# Patient Record
Sex: Female | Born: 1960
Health system: Southern US, Community
[De-identification: ages and names within clinical notes are randomized; demographics above are authoritative.]

## PROBLEM LIST (undated history)

## (undated) DIAGNOSIS — Z9889 Other specified postprocedural states: Secondary | ICD-10-CM

## (undated) DIAGNOSIS — E079 Disorder of thyroid, unspecified: Secondary | ICD-10-CM

## (undated) DIAGNOSIS — L509 Urticaria, unspecified: Secondary | ICD-10-CM

## (undated) DIAGNOSIS — M797 Fibromyalgia: Secondary | ICD-10-CM

## (undated) DIAGNOSIS — G510 Bell's palsy: Secondary | ICD-10-CM

## (undated) DIAGNOSIS — I10 Essential (primary) hypertension: Secondary | ICD-10-CM

## (undated) DIAGNOSIS — E063 Autoimmune thyroiditis: Secondary | ICD-10-CM

## (undated) DIAGNOSIS — I499 Cardiac arrhythmia, unspecified: Secondary | ICD-10-CM

## (undated) DIAGNOSIS — M1990 Unspecified osteoarthritis, unspecified site: Secondary | ICD-10-CM

## (undated) DIAGNOSIS — I509 Heart failure, unspecified: Secondary | ICD-10-CM

## (undated) DIAGNOSIS — G4730 Sleep apnea, unspecified: Secondary | ICD-10-CM

## (undated) DIAGNOSIS — D8940 Mast cell activation, unspecified: Secondary | ICD-10-CM

## (undated) DIAGNOSIS — Z9289 Personal history of other medical treatment: Secondary | ICD-10-CM

## (undated) DIAGNOSIS — R079 Chest pain, unspecified: Secondary | ICD-10-CM

## (undated) HISTORY — DX: Urticaria, unspecified: L50.9

## (undated) HISTORY — DX: Autoimmune thyroiditis: E06.3

## (undated) HISTORY — PX: TONSILLECTOMY: SUR1361

---

## 2002-03-02 ENCOUNTER — Encounter: Payer: Self-pay | Admitting: Family Medicine

## 2002-03-02 ENCOUNTER — Ambulatory Visit (HOSPITAL_COMMUNITY): Admission: RE | Admit: 2002-03-02 | Discharge: 2002-03-02 | Payer: Self-pay | Admitting: Family Medicine

## 2004-02-09 ENCOUNTER — Emergency Department (HOSPITAL_COMMUNITY): Admission: EM | Admit: 2004-02-09 | Discharge: 2004-02-10 | Payer: Self-pay | Admitting: *Deleted

## 2006-06-06 ENCOUNTER — Emergency Department (HOSPITAL_COMMUNITY): Admission: EM | Admit: 2006-06-06 | Discharge: 2006-06-06 | Payer: Self-pay | Admitting: Emergency Medicine

## 2008-12-16 ENCOUNTER — Ambulatory Visit (HOSPITAL_COMMUNITY): Admission: RE | Admit: 2008-12-16 | Discharge: 2008-12-16 | Payer: Self-pay | Admitting: Family Medicine

## 2012-04-03 ENCOUNTER — Emergency Department (HOSPITAL_COMMUNITY)
Admission: EM | Admit: 2012-04-03 | Discharge: 2012-04-03 | Disposition: A | Discharge status: Home / Self Care | Payer: Self-pay | Attending: Emergency Medicine | Admitting: Emergency Medicine

## 2012-04-03 ENCOUNTER — Emergency Department (HOSPITAL_COMMUNITY): Payer: Self-pay

## 2012-04-03 ENCOUNTER — Encounter (HOSPITAL_COMMUNITY): Payer: Self-pay | Admitting: Emergency Medicine

## 2012-04-03 DIAGNOSIS — R42 Dizziness and giddiness: Secondary | ICD-10-CM | POA: Insufficient documentation

## 2012-04-03 DIAGNOSIS — R079 Chest pain, unspecified: Secondary | ICD-10-CM | POA: Insufficient documentation

## 2012-04-03 DIAGNOSIS — R279 Unspecified lack of coordination: Secondary | ICD-10-CM | POA: Insufficient documentation

## 2012-04-03 HISTORY — DX: Essential (primary) hypertension: I10

## 2012-04-03 HISTORY — DX: Unspecified osteoarthritis, unspecified site: M19.90

## 2012-04-03 HISTORY — DX: Cardiac arrhythmia, unspecified: I49.9

## 2012-04-03 HISTORY — DX: Fibromyalgia: M79.7

## 2012-04-03 LAB — DIFFERENTIAL
Basophils Relative: 1 % (ref 0–1)
Lymphocytes Relative: 47 % — ABNORMAL HIGH (ref 12–46)
Lymphs Abs: 3.9 10*3/uL (ref 0.7–4.0)
Monocytes Absolute: 0.7 10*3/uL (ref 0.1–1.0)
Monocytes Relative: 8 % (ref 3–12)
Neutro Abs: 3.4 10*3/uL (ref 1.7–7.7)
Neutrophils Relative %: 41 % — ABNORMAL LOW (ref 43–77)

## 2012-04-03 LAB — BASIC METABOLIC PANEL
BUN: 17 mg/dL (ref 6–23)
CO2: 24 mEq/L (ref 19–32)
Chloride: 104 mEq/L (ref 96–112)
GFR calc Af Amer: >90 mL/min (ref >90)
Potassium: 3.4 mEq/L — ABNORMAL LOW (ref 3.5–5.1)

## 2012-04-03 LAB — CBC
HCT: 39.2 % (ref 36.0–46.0)
Hemoglobin: 12.9 g/dL (ref 12.0–15.0)
MCHC: 32.9 g/dL (ref 30.0–36.0)
RBC: 4.79 MIL/uL (ref 3.87–5.11)
WBC: 8.4 10*3/uL (ref 4.0–10.5)

## 2012-04-03 LAB — CARDIAC PANEL(CRET KIN+CKTOT+MB+TROPI)
Relative Index: INVALID (ref 0.0–2.5)
Total CK: 84 U/L (ref 7–177)
Troponin I: <0.3 ng/mL (ref <0.30)

## 2012-04-03 MED ORDER — MECLIZINE HCL 12.5 MG PO TABS
25.0000 mg | ORAL_TABLET | Freq: Once | ORAL | Status: AC
  Administered 2012-04-03: 25 mg via ORAL
  Filled 2012-04-03: qty 2

## 2012-04-03 MED ORDER — LORAZEPAM 1 MG PO TABS
1.0000 mg | ORAL_TABLET | Freq: Once | ORAL | Status: DC
  Filled 2012-04-03: qty 1

## 2012-04-03 MED ORDER — LORAZEPAM 1 MG PO TABS: 1.0000 mg | ORAL_TABLET | Freq: Three times a day (TID) | ORAL | Status: AC | PRN

## 2012-04-03 MED ORDER — MECLIZINE HCL 25 MG PO TABS: 25.0000 mg | ORAL_TABLET | Freq: Four times a day (QID) | ORAL | Status: AC | PRN

## 2012-04-03 NOTE — Discharge Instructions (Signed)
Benign Positional Vertigo Vertigo means you feel like you or your surroundings are moving when they are not. Benign positional vertigo is the most common form of vertigo. Benign means that the cause of your condition is not serious. Benign positional vertigo is more common in older adults. CAUSES  Benign positional vertigo is the result of an upset in the labyrinth system. This is an area in the middle ear that helps control your balance. This may be caused by a viral infection, head injury, or repetitive motion. However, often no specific cause is found. SYMPTOMS  Symptoms of benign positional vertigo occur when you move your head or eyes in different directions. Some of the symptoms may include:  Loss of balance and falls.   Vomiting.   Blurred vision.   Dizziness.   Nausea.   Involuntary eye movements (nystagmus).  DIAGNOSIS  Benign positional vertigo is usually diagnosed by physical exam. If the specific cause of your benign positional vertigo is unknown, your caregiver may perform imaging tests, such as magnetic resonance imaging (MRI) or computed tomography (CT). TREATMENT  Your caregiver may recommend movements or procedures to correct the benign positional vertigo. Medicines such as meclizine, benzodiazepines, and medicines for nausea may be used to treat your symptoms. In rare cases, if your symptoms are caused by certain conditions that affect the inner ear, you may need surgery. HOME CARE INSTRUCTIONS   Follow your caregiver's instructions.   Move slowly. Do not make sudden body or head movements.   Avoid driving.   Avoid operating heavy machinery.   Avoid performing any tasks that would be dangerous to you or others during a vertigo episode.   Drink enough fluids to keep your urine clear or pale yellow.  SEEK IMMEDIATE MEDICAL CARE IF:   You develop problems with walking, weakness, numbness, or using your arms, hands, or legs.   You have difficulty speaking.   You  develop severe headaches.   Your nausea or vomiting continues or gets worse.   You develop visual changes.   Your family or friends notice any behavioral changes.   Your condition gets worse.   You have a fever.   You develop a stiff neck or sensitivity to light.  MAKE SURE YOU:   Understand these instructions.   Will watch your condition.   Will get help right away if you are not doing well or get worse.  Document Released: 07/26/2006 Document Revised: 10/07/2011 Document Reviewed: 07/08/2011 [facility] Patient Information 2012 [place], [place].  CT scan of head was normal. Medication for dizziness and relaxation. Followup your primary care Dr.

## 2012-04-03 NOTE — ED Notes (Signed)
Pt states she took 2 81mg  aspirin prior to arrival.

## 2012-04-03 NOTE — ED Notes (Signed)
Pt refused her ativan. Stated that she has xanax at home and she does not like the way it makes her feel, states it makes her feel "loopy"

## 2012-04-03 NOTE — ED Notes (Signed)
Pt presents with dizziness and chest pain. States she has been dizzy on and off for the last few days. States that started this evening she had chest pain that she describes as a pressure. States that she also has panic attacks and that these symtoms feel similar to the panic attacks she has had in the past. Patient is non diaphoretic at this time. AAx4 and appears neurvous and anxious.

## 2012-04-03 NOTE — ED Provider Notes (Addendum)
History     CSN: [id number]  Arrival date & time 04/03/12  0418   First MD Initiated Contact with Patient 04/03/12 0435      Chief Complaint  Patient presents with  . Chest Pain  . Dizziness    (Consider location/radiation/quality/duration/timing/severity/associated sxs/prior treatment) HPI....dizziness for 2 days. Patient is slightly ataxic with ambulation. Complains of intermittent chest discomfort. Has history of panic attacks. No dyspnea, diaphoresis, nausea. No radiation of chest pain. Nothing makes any of her symptoms better or worse. Severity is mild.  Past Medical History  Diagnosis Date  . Fibromyalgia   . Arthritis   . Dysrhythmia, cardiac   . Hypertension     History reviewed. No pertinent past surgical history.  History reviewed. No pertinent family history.  History  Substance Use Topics  . Smoking status: Former Games developer  . Smokeless tobacco: Not on file  . Alcohol Use: No    OB History    Grav Para Term Preterm Abortions TAB SAB Ect Mult Living                  Review of Systems  All other systems reviewed and are negative.    Allergies  Review of patient's allergies indicates no known allergies.  Home Medications  No current outpatient prescriptions on file.  LMP 01/02/2012  Physical Exam  Nursing note and vitals reviewed. Constitutional: She is oriented to person, place, and time. She appears well-developed and well-nourished.       obese  HENT:  Head: Normocephalic and atraumatic.  Eyes: Conjunctivae and EOM are normal. Pupils are equal, round, and reactive to light.  Neck: Normal range of motion. Neck supple.  Cardiovascular: Normal rate and regular rhythm.   Pulmonary/Chest: Effort normal and breath sounds normal.  Abdominal: Soft. Bowel sounds are normal.  Musculoskeletal: Normal range of motion.  Neurological: She is alert and oriented to person, place, and time.       Slight gait ataxia  Skin: Skin is warm and dry.    Psychiatric: She has a normal mood and affect.    ED Course  Procedures (including critical care time)  Labs Reviewed - No data to display No results found. Results for orders placed during the hospital encounter of 04/03/12  CBC      Component Value Range   WBC 8.4  4.0 - 10.5 (K/uL)   RBC 4.79  3.87 - 5.11 (MIL/uL)   Hemoglobin 12.9  12.0 - 15.0 (g/dL)   HCT 11.9  14.7 - 82.9 (%)   MCV 81.8  78.0 - 100.0 (fL)   MCH 26.9  26.0 - 34.0 (pg)   MCHC 32.9  30.0 - 36.0 (g/dL)   RDW 56.2 (*) 13.0 - 15.5 (%)   Platelets 291  150 - 400 (K/uL)  DIFFERENTIAL      Component Value Range   Neutrophils Relative 41 (*) 43 - 77 (%)   Neutro Abs 3.4  1.7 - 7.7 (K/uL)   Lymphocytes Relative 47 (*) 12 - 46 (%)   Lymphs Abs 3.9  0.7 - 4.0 (K/uL)   Monocytes Relative 8  3 - 12 (%)   Monocytes Absolute 0.7  0.1 - 1.0 (K/uL)   Eosinophils Relative 3  0 - 5 (%)   Eosinophils Absolute 0.3  0.0 - 0.7 (K/uL)   Basophils Relative 1  0 - 1 (%)   Basophils Absolute 0.1  0.0 - 0.1 (K/uL)  BASIC METABOLIC PANEL      Component Value  Range   Sodium 139  135 - 145 (mEq/L)   Potassium 3.4 (*) 3.5 - 5.1 (mEq/L)   Chloride 104  96 - 112 (mEq/L)   CO2 24  19 - 32 (mEq/L)   Glucose, Bld 130 (*) 70 - 99 (mg/dL)   BUN 17  6 - 23 (mg/dL)   Creatinine, Ser 1.61  0.50 - 1.10 (mg/dL)   Calcium 9.3  8.4 - 09.6 (mg/dL)   GFR calc non Af Amer >90  >90 (mL/min)   GFR calc Af Amer >90  >90 (mL/min)  CARDIAC PANEL(CRET KIN+CKTOT+MB+TROPI)      Component Value Range   Total CK 84  7 - 177 (U/L)   CK, MB 1.9  0.3 - 4.0 (ng/mL)   Troponin I <0.30  <0.30 (ng/mL)   Relative Index RELATIVE INDEX IS INVALID  0.0 - 2.5    Ct Head Wo Contrast  04/03/2012  *RADIOLOGY REPORT*  Clinical Data: Dizziness.  CT HEAD WITHOUT CONTRAST  Technique:  Contiguous axial images were obtained from the base of the skull through the vertex without contrast.  Comparison: None  Findings: The ventricles are normal.  No extra-axial fluid  collections are seen.  The brainstem and cerebellum are unremarkable.  No acute intracranial findings such as infarction or hemorrhage.  No mass lesions.  The bony calvarium is intact.  The visualized paranasal sinuses and mastoid air cells are clear.  IMPRESSION: No acute intracranial findings or mass lesion.  Original Report Authenticated By: P. Loralie Champagne, M.D.   No diagnosis found.   Date: 04/03/2012  Rate: 87  Rhythm: normal sinus rhythm  QRS Axis: normal  Intervals: normal  ST/T Wave abnormalities: normal  Conduction Disutrbances: none  Narrative Interpretation: unremarkable     MDM  History and physical most consistent with vertigo. No evidence of stroke. Was discharged with Antivert and Ativan.  She will followup with her primary care Dr. Discussed with patient and husband.        Donnetta Hutching, MD 04/03/12 0454  Donnetta Hutching, MD 04/03/12 [phone]

## 2012-04-03 NOTE — ED Notes (Signed)
Pt returned from xray

## 2012-09-06 ENCOUNTER — Encounter (HOSPITAL_COMMUNITY): Payer: Self-pay | Admitting: *Deleted

## 2012-09-06 ENCOUNTER — Emergency Department (HOSPITAL_COMMUNITY): Payer: Self-pay

## 2012-09-06 ENCOUNTER — Emergency Department (HOSPITAL_COMMUNITY)
Admission: EM | Admit: 2012-09-06 | Discharge: 2012-09-06 | Disposition: A | Discharge status: Home / Self Care | Payer: Self-pay | Attending: Emergency Medicine | Admitting: Emergency Medicine

## 2012-09-06 DIAGNOSIS — Z8679 Personal history of other diseases of the circulatory system: Secondary | ICD-10-CM | POA: Insufficient documentation

## 2012-09-06 DIAGNOSIS — M549 Dorsalgia, unspecified: Secondary | ICD-10-CM

## 2012-09-06 DIAGNOSIS — M545 Low back pain, unspecified: Secondary | ICD-10-CM | POA: Insufficient documentation

## 2012-09-06 DIAGNOSIS — Z8739 Personal history of other diseases of the musculoskeletal system and connective tissue: Secondary | ICD-10-CM | POA: Insufficient documentation

## 2012-09-06 DIAGNOSIS — R319 Hematuria, unspecified: Secondary | ICD-10-CM | POA: Insufficient documentation

## 2012-09-06 DIAGNOSIS — Z79899 Other long term (current) drug therapy: Secondary | ICD-10-CM | POA: Insufficient documentation

## 2012-09-06 DIAGNOSIS — Z87891 Personal history of nicotine dependence: Secondary | ICD-10-CM | POA: Insufficient documentation

## 2012-09-06 DIAGNOSIS — I10 Essential (primary) hypertension: Secondary | ICD-10-CM | POA: Insufficient documentation

## 2012-09-06 LAB — URINE MICROSCOPIC-ADD ON

## 2012-09-06 LAB — URINALYSIS, ROUTINE W REFLEX MICROSCOPIC
Bilirubin Urine: NEGATIVE
Leukocytes, UA: NEGATIVE
Nitrite: NEGATIVE
Specific Gravity, Urine: <1.005 — ABNORMAL LOW (ref 1.005–1.030)
Urobilinogen, UA: 0.2 mg/dL (ref 0.0–1.0)

## 2012-09-06 LAB — COMPREHENSIVE METABOLIC PANEL
Albumin: 3.9 g/dL (ref 3.5–5.2)
BUN: 14 mg/dL (ref 6–23)
Creatinine, Ser: 0.67 mg/dL (ref 0.50–1.10)
Total Protein: 7.2 g/dL (ref 6.0–8.3)

## 2012-09-06 LAB — CBC WITH DIFFERENTIAL/PLATELET
Eosinophils Relative: 2 % (ref 0–5)
HCT: 41.6 % (ref 36.0–46.0)
Hemoglobin: 13.9 g/dL (ref 12.0–15.0)
Lymphocytes Relative: 45 % (ref 12–46)
Lymphs Abs: 3.1 10*3/uL (ref 0.7–4.0)
MCV: 83.4 fL (ref 78.0–100.0)
Monocytes Absolute: 0.6 10*3/uL (ref 0.1–1.0)
Monocytes Relative: 8 % (ref 3–12)
RBC: 4.99 MIL/uL (ref 3.87–5.11)
WBC: 6.9 10*3/uL (ref 4.0–10.5)

## 2012-09-06 MED ORDER — TRAMADOL HCL 50 MG PO TABS: 50.0000 mg | ORAL_TABLET | Freq: Four times a day (QID) | ORAL | Status: DC | PRN

## 2012-09-06 NOTE — ED Provider Notes (Signed)
History   This chart was scribed for Summer Munch, MD by Sofie Rower. The patient was seen in room APA03/APA03 and the patient's care was started at 7:32PM.     CSN: [id number]  Arrival date & time 09/06/12  1909   First MD Initiated Contact with Patient 09/06/12 1932      Chief Complaint  Patient presents with  . Back Pain    (Consider location/radiation/quality/duration/timing/severity/associated sxs/prior treatment) Patient is a 51 y.o. female presenting with back pain. The history is provided by the patient. No language interpreter was used.  Back Pain     Summer [name] is a 51 y.o. female , with a hx of fibromyalgia, arthritis, and UTI, who presents to the Emergency Department complaining of gradual, progressively worsening, back pain located bilaterally at the lower lumbar region, onset three weeks ago.  Associated symptoms include bilateral hip pain (onset two days ago, 09/04/12) and nausea. The pt reports she is experiencing a lower back pain, however it is more severe on the right side of the lower back, in comparison towards that of the left side. In addition, the pt informs she was recently evaluated and treated for a UTI by her PCP one week ago. The pt has taken antibiotics, Macrobid, and Cipro which provide moderate relief of her UTI symptoms.  The pt denies any fall or trauma episode which could have prompted the back pain, diarrhea, chills, difficulty breathing, vaginal bleeding, and vaginal discharge.   The pt does not smoke or drink alcohol.   PCP is Dr. Gerda Diss.    Past Medical History  Diagnosis Date  . Fibromyalgia   . Arthritis   . Dysrhythmia, cardiac   . Hypertension     History reviewed. No pertinent past surgical history.  History reviewed. No pertinent family history.  History  Substance Use Topics  . Smoking status: Former Games developer  . Smokeless tobacco: Not on file  . Alcohol Use: No    OB History    Grav Para Term Preterm Abortions TAB SAB  Ect Mult Living                  Review of Systems  Constitutional:       Per HPI, otherwise negative  HENT:       Per HPI, otherwise negative  Eyes: Negative.   Respiratory:       Per HPI, otherwise negative  Cardiovascular:       Per HPI, otherwise negative  Gastrointestinal: Negative for vomiting.  Genitourinary: Negative.   Musculoskeletal: Positive for back pain.       Per HPI, otherwise negative  Skin: Negative.   Neurological: Negative for syncope.    Allergies  Bee venom and Shellfish allergy  Home Medications   Current Outpatient Rx  Name  Route  Sig  Dispense  Refill  . ACIDOPHILUS PO TABS   Oral   Take 1 tablet by mouth daily.         Marland Kitchen CIPROFLOXACIN HCL 500 MG PO TABS   Oral   Take 500 mg by mouth 2 (two) times daily.         Marland Kitchen NITROFURANTOIN MONOHYD MACRO 100 MG PO CAPS   Oral   Take 100 mg by mouth 2 (two) times daily.           BP 121/62  Pulse 79  Temp 98.2 F (36.8 C) (Oral)  Resp 20  Ht 5\' 5"  (1.651 m)  Wt 250 lb (113.399 kg)  BMI 41.60 kg/m2  SpO2 100%  Physical Exam  Nursing note and vitals reviewed. Constitutional: She is oriented to person, place, and time. She appears well-developed and well-nourished. No distress.  HENT:  Head: Normocephalic and atraumatic.  Eyes: Conjunctivae normal and EOM are normal.  Cardiovascular: Normal rate and regular rhythm.   Pulmonary/Chest: Effort normal and breath sounds normal. No stridor. No respiratory distress.  Abdominal: Soft. She exhibits no distension. There is no tenderness. There is no rebound and no guarding.  Musculoskeletal: She exhibits no edema.       No referred pain detected. Good strength at the bilateral lower extremities.   Neurological: She is alert and oriented to person, place, and time. No cranial nerve deficit.  Skin: Skin is warm and dry.  Psychiatric: She has a normal mood and affect.    ED Course  Procedures (including critical care time)  DIAGNOSTIC  STUDIES: Oxygen Saturation is 100% on room air, normal by my interpretation.    COORDINATION OF CARE:   7:42 PM- Treatment plan concerning blood work, UA, and possible muscle strain discussed with patient. Pt agrees with treatment.   9:23 PM- Recheck. Treatment plan concerning laboratory results and CT scan of abdomen discussed with patient. Pt agrees with treatment.     Results for orders placed during the hospital encounter of 09/06/12  URINALYSIS, ROUTINE W REFLEX MICROSCOPIC      Component Value Range   Color, Urine YELLOW  YELLOW   APPearance CLEAR  CLEAR   Specific Gravity, Urine <1.005 (*) 1.005 - 1.030   pH 5.5  5.0 - 8.0   Glucose, UA NEGATIVE  NEGATIVE mg/dL   Hgb urine dipstick TRACE (*) NEGATIVE   Bilirubin Urine NEGATIVE  NEGATIVE   Ketones, ur TRACE (*) NEGATIVE mg/dL   Protein, ur NEGATIVE  NEGATIVE mg/dL   Urobilinogen, UA 0.2  0.0 - 1.0 mg/dL   Nitrite NEGATIVE  NEGATIVE   Leukocytes, UA NEGATIVE  NEGATIVE  COMPREHENSIVE METABOLIC PANEL      Component Value Range   Sodium 137  135 - 145 mEq/L   Potassium 3.3 (*) 3.5 - 5.1 mEq/L   Chloride 101  96 - 112 mEq/L   CO2 25  19 - 32 mEq/L   Glucose, Bld 88  70 - 99 mg/dL   BUN 14  6 - 23 mg/dL   Creatinine, Ser 1.47  0.50 - 1.10 mg/dL   Calcium 9.7  8.4 - 82.9 mg/dL   Total Protein 7.2  6.0 - 8.3 g/dL   Albumin 3.9  3.5 - 5.2 g/dL   AST 21  0 - 37 U/L   ALT 16  0 - 35 U/L   Alkaline Phosphatase 87  39 - 117 U/L   Total Bilirubin 0.5  0.3 - 1.2 mg/dL   GFR calc non Af Amer >90  >90 mL/min   GFR calc Af Amer >90  >90 mL/min  CBC WITH DIFFERENTIAL      Component Value Range   WBC 6.9  4.0 - 10.5 K/uL   RBC 4.99  3.87 - 5.11 MIL/uL   Hemoglobin 13.9  12.0 - 15.0 g/dL   HCT 56.2  13.0 - 86.5 %   MCV 83.4  78.0 - 100.0 fL   MCH 27.9  26.0 - 34.0 pg   MCHC 33.4  30.0 - 36.0 g/dL   RDW 78.4 (*) 69.6 - 29.5 %   Platelets 297  150 - 400 K/uL   Neutrophils Relative 45  43 - 77 %   Neutro Abs 3.0  1.7 - 7.7 K/uL    Lymphocytes Relative 45  12 - 46 %   Lymphs Abs 3.1  0.7 - 4.0 K/uL   Monocytes Relative 8  3 - 12 %   Monocytes Absolute 0.6  0.1 - 1.0 K/uL   Eosinophils Relative 2  0 - 5 %   Eosinophils Absolute 0.2  0.0 - 0.7 K/uL   Basophils Relative 0  0 - 1 %   Basophils Absolute 0.0  0.0 - 0.1 K/uL  URINE MICROSCOPIC-ADD ON      Component Value Range   Squamous Epithelial / LPF RARE  RARE   WBC, UA 0-2  <3 WBC/hpf   RBC / HPF 0-2  <3 RBC/hpf   Bacteria, UA RARE  RARE   Ct Abdomen Pelvis Wo Contrast  09/06/2012  *RADIOLOGY REPORT*  Clinical Data: Bilateral flank pain, right greater than left.  CT ABDOMEN AND PELVIS WITHOUT CONTRAST  Technique:  Multidetector CT imaging of the abdomen and pelvis was performed following the standard protocol without intravenous contrast.  Comparison: None.  Findings: No evidence of urinary tract calculi or obstruction on either side.  Approximate 2.5 cm simple cyst arising from the lower pole of the right kidney.  Within the limits of the unenhanced technique, no significant focal involving either kidney.  Normal unenhanced appearance of the liver, spleen and gallbladder. No biliary ductal dilation.  No visible aorto-iliofemoral atherosclerosis.  No significant lymphadenopathy.  Stomach decompressed and unremarkable by CT.  Normal-appearing small bowel and colon.  Normal appendix in the right upper pelvis. No ascites.  Small periumbilical hernia containing fat.  Uterus and ovaries unremarkable by CT.  No free pelvic fluid. Urinary bladder unremarkable.  Phleboliths low in both sides of the pelvis.  Bone window images demonstrate mild lower thoracic spondylosis and facet degenerative changes involving the lower lumbar spine. Visualized lung bases clear.  IMPRESSION:  1.  No evidence of urinary tract calculi or obstruction on either side. 2.  No acute or significant abnormalities involving the abdomen or pelvis.   Original Report Authenticated By: Hulan Saas, M.D.        No diagnosis found.    MDM  I personally performed the services described in this documentation, which was scribed in my presence. The recorded information has been reviewed and considered.  The patient presents with ongoing back pain, recent diagnosis of UTI.  On exam she is in no distress.  The patient's vital signs are unremarkable.  The urinalysis does not demonstrate evidence of ongoing infection, but there is trace blood, suggestive of kidney stones given her history of back pain.  A followup CAT scan was unremarkable.  Throughout this evaluation the patient deferred analgesics.  She was discharged in stable condition with PMD followup.  Summer Munch, MD 09/06/12 2206

## 2012-09-06 NOTE — ED Notes (Signed)
Pt notes lower back pain for several weeks. Notes that recently the pain has started to radiate to her sides bilaterally. Notes the right side is more painful however. No other complaints at this time. Denies urinary symptoms at this time but states several weeks ago she did has some pain with urination.

## 2012-09-06 NOTE — ED Notes (Signed)
Recent treatment for UTI , took antibiotic, macrobid, cipro, without relief of sx.  No fever, Nausea, no vomiting.

## 2013-02-01 ENCOUNTER — Ambulatory Visit (INDEPENDENT_AMBULATORY_CARE_PROVIDER_SITE_OTHER): Payer: Self-pay | Admitting: Family Medicine

## 2013-02-01 ENCOUNTER — Encounter: Payer: Self-pay | Admitting: Family Medicine

## 2013-02-01 VITALS — BP 140/100 | Temp 97.5°F | Wt 273.0 lb

## 2013-02-01 DIAGNOSIS — G510 Bell's palsy: Secondary | ICD-10-CM | POA: Insufficient documentation

## 2013-02-01 MED ORDER — PREDNISONE 20 MG PO TABS: ORAL_TABLET | ORAL | Status: AC

## 2013-02-01 MED ORDER — ACYCLOVIR 400 MG PO TABS: 400.0000 mg | ORAL_TABLET | Freq: Every day | ORAL | Status: AC

## 2013-02-01 MED ORDER — ACYCLOVIR 800 MG PO TABS: ORAL_TABLET | ORAL | Status: DC

## 2013-02-01 NOTE — Patient Instructions (Addendum)
Bell's Palsy       Bell's palsy is a condition in which the muscles on one side of the face cannot move (paralysis). This is because the nerves in the face are paralyzed. It is most often thought to be caused by a virus. The virus causes swelling of the nerve that controls movement on one side of the face. The nerve travels through a tight space surrounded by bone. When the nerve swells, it can be compressed by the bone. This results in damage to the protective covering around the nerve. This damage interferes with how the nerve communicates with the muscles of the face. As a result, it can cause weakness or paralysis of the facial muscles.   Injury (trauma), tumor, and surgery may cause Bell's palsy, but most of the time the cause is unknown. It is a relatively common condition. It starts suddenly (abrupt onset) with the paralysis usually ending within 2 days. Bell's palsy is not dangerous. But because the eye does not close properly, you may need care to keep the eye from getting dry. This can include splinting (to keep the eye shut) or moistening with artificial tears. Bell's palsy very seldom occurs on both sides of the face at the same time.   SYMPTOMS   Eyebrow sagging.   Drooping of the eyelid and corner of the mouth.   Inability to close one eye.   Loss of taste on the front of the tongue.   Sensitivity to loud noises.  TREATMENT   The treatment is usually non-surgical. If the patient is seen within the first 24 to 48 hours, a short course of steroids may be prescribed, in an attempt to shorten the length of the condition. Antiviral medicines may also be used with the steroids, but it is unclear if they are helpful.   You will need to protect your eye, if you cannot close it. The cornea (clear covering over your eye) will become dry and can be damaged. Artificial tears can be used to keep your eye moist. Glasses or an eye patch should be worn to protect your eye.   PROGNOSIS   Recovery is variable, ranging  from days to months. Although the problem usually goes away completely (about 80% of cases resolve), predicting the outcome is impossible. Most people improve within 3 weeks of when the symptoms began. Improvement may continue for 3 to 6 months. A small number of people have moderate to severe weakness that is permanent.   HOME CARE INSTRUCTIONS   If your caregiver prescribed medication to reduce swelling in the nerve, use as directed. Do not stop taking the medication unless directed by your caregiver.   Use moisturizing eye drops as needed to prevent drying of your eye, as directed by your caregiver.   Protect your eye, as directed by your caregiver.   Use facial massage and exercises, as directed by your caregiver.   Perform your normal activities, and get your normal rest.  SEEK IMMEDIATE MEDICAL CARE IF:   There is pain, redness or irritation in the eye.   You or your child has an oral temperature above 102 F (38.9 C), not controlled by medicine.  MAKE SURE YOU:   Understand these instructions.   Will watch your condition.   Will get help right away if you are not doing well or get worse.  Document Released: 10/18/2005 Document Revised: 01/10/2012 Document Reviewed: 10/27/2009   [employer] Patient Information 2013 [employer]

## 2013-02-01 NOTE — Progress Notes (Signed)
  Subjective:    Patient ID: Summer [name], female    DOB: 12-06-1960, 52 y.o.   MRN: [id number]  HPI This patient states yesterday she was yawning she felt a pulling in her left lower jaw region into the neck and then she noticed later that day that she was having some numbness and tingling. She also related that she had weakness on that side of the face and when she swallows things he drools out of her mouth she denies any choking she denies any vomiting. She's never had anything like this before. Past medical history family history social history all reviewed.   Review of Systems     Objective:   Physical Exam On physical exam eardrums look normal throat is normal tongue moves well her lungs are clear hearts regular arms strength is equal bilateral hand strength equal bilateral no numbness in the upper extremities chest or legs. She does have some weakness on the left side of the face she has a slight difficult time closing her left eye but she can close to but not as strong as the right side she also has inability to smile on the left side compared to the right side.       Assessment & Plan:  Bell's palsy-we will go ahead and cover with acyclovir 400 mg 5 times daily for the next 10 days, prednisone 20 mg 3 daily for the next 4 days then 2 a day for 4 days. I recommend that we recheck her again in one week's time. Offered to send her to ear nose throat Dr. She has no insurance and she states she cannot afford that. I did talk to her about using artificial tears and the safe method of taking her high at nighttime for sleep.

## 2013-02-06 ENCOUNTER — Encounter: Payer: Self-pay | Admitting: *Deleted

## 2013-02-09 ENCOUNTER — Ambulatory Visit (INDEPENDENT_AMBULATORY_CARE_PROVIDER_SITE_OTHER): Payer: Self-pay | Admitting: Family Medicine

## 2013-02-09 ENCOUNTER — Encounter: Payer: Self-pay | Admitting: Family Medicine

## 2013-02-09 VITALS — BP 132/90 | Temp 98.0°F | Ht 65.0 in | Wt 272.8 lb

## 2013-02-09 DIAGNOSIS — G510 Bell's palsy: Secondary | ICD-10-CM

## 2013-02-09 NOTE — Progress Notes (Signed)
  Subjective:    Patient ID: Summer [name], female    DOB: Dec 28, 1960, 52 y.o.   MRN: [id number]  HPI Patient still having facial weakness on left side no weakness in the arms or leg no numbness tingling no nausea vomiting or loss of consciousness she did take prednisone as directed acyclovir she took 4 times a day on some days 5 times on others. She has noticed no significant improvement. Please see previous note.   Review of Systems see above. No fevers or chills or sinus pressure pain or headache.      Objective:   Physical Exam  Eardrums normal throat normal neck supple lungs clear heart regular she does have facial weakness on the left side of the face consistent with Bell's palsy      Assessment & Plan:  Bell's palsy-finish medication as directed. I do believe this patient would be best served by going ahead and being seen by a specialist we will set her up with ENT. I did instruct her to use Lacri-Lube at nighttime a rise in artificial tears during the day.

## 2013-03-04 ENCOUNTER — Encounter (HOSPITAL_COMMUNITY): Payer: Self-pay | Admitting: Emergency Medicine

## 2013-03-04 ENCOUNTER — Emergency Department (HOSPITAL_COMMUNITY)
Admission: EM | Admit: 2013-03-04 | Discharge: 2013-03-05 | Disposition: A | Discharge status: Home / Self Care | Payer: Self-pay | Attending: Emergency Medicine | Admitting: Emergency Medicine

## 2013-03-04 DIAGNOSIS — Z87891 Personal history of nicotine dependence: Secondary | ICD-10-CM | POA: Insufficient documentation

## 2013-03-04 DIAGNOSIS — Z8669 Personal history of other diseases of the nervous system and sense organs: Secondary | ICD-10-CM | POA: Insufficient documentation

## 2013-03-04 DIAGNOSIS — B349 Viral infection, unspecified: Secondary | ICD-10-CM

## 2013-03-04 DIAGNOSIS — Z8679 Personal history of other diseases of the circulatory system: Secondary | ICD-10-CM | POA: Insufficient documentation

## 2013-03-04 DIAGNOSIS — B9789 Other viral agents as the cause of diseases classified elsewhere: Secondary | ICD-10-CM | POA: Insufficient documentation

## 2013-03-04 DIAGNOSIS — Z8739 Personal history of other diseases of the musculoskeletal system and connective tissue: Secondary | ICD-10-CM | POA: Insufficient documentation

## 2013-03-04 DIAGNOSIS — I10 Essential (primary) hypertension: Secondary | ICD-10-CM | POA: Insufficient documentation

## 2013-03-04 HISTORY — DX: Bell's palsy: G51.0

## 2013-03-04 NOTE — ED Notes (Signed)
Complaining of pain to lymph nodes, joints, back, left leg, and abdomen that started a couple days ago. Reports recently diagnosed with bell's palsy.

## 2013-03-05 ENCOUNTER — Encounter: Payer: Self-pay | Admitting: Family Medicine

## 2013-03-05 ENCOUNTER — Ambulatory Visit (INDEPENDENT_AMBULATORY_CARE_PROVIDER_SITE_OTHER): Payer: Self-pay | Admitting: Family Medicine

## 2013-03-05 VITALS — BP 132/98 | Temp 98.5°F | Wt 274.2 lb

## 2013-03-05 DIAGNOSIS — W57XXXA Bitten or stung by nonvenomous insect and other nonvenomous arthropods, initial encounter: Secondary | ICD-10-CM

## 2013-03-05 LAB — CBC WITH DIFFERENTIAL/PLATELET
Basophils Absolute: 0 10*3/uL (ref 0.0–0.1)
HCT: 39.6 % (ref 36.0–46.0)
Hemoglobin: 13.6 g/dL (ref 12.0–15.0)
Lymphocytes Relative: 43 % (ref 12–46)
Monocytes Absolute: 0.8 10*3/uL (ref 0.1–1.0)
Neutro Abs: 3.9 10*3/uL (ref 1.7–7.7)
RDW: 16.3 % — ABNORMAL HIGH (ref 11.5–15.5)
WBC: 8.6 10*3/uL (ref 4.0–10.5)

## 2013-03-05 MED ORDER — DOXYCYCLINE HYCLATE 100 MG PO CAPS: 100.0000 mg | ORAL_CAPSULE | Freq: Two times a day (BID) | ORAL | Status: DC

## 2013-03-05 MED ORDER — KETOROLAC TROMETHAMINE 30 MG/ML IJ SOLN: 30.0000 mg | Freq: Once | INTRAMUSCULAR | Status: DC

## 2013-03-05 MED ORDER — SODIUM CHLORIDE 0.9 % IV BOLUS (SEPSIS)
1000.0000 mL | Freq: Once | INTRAVENOUS | Status: AC
  Administered 2013-03-05: 1000 mL via INTRAVENOUS

## 2013-03-05 NOTE — ED Provider Notes (Signed)
History     CSN: [id number]  Arrival date & time 03/04/13  2334   First MD Initiated Contact with Patient 03/05/13 0044      Chief Complaint  Patient presents with  . Pain    (Consider location/radiation/quality/duration/timing/severity/associated sxs/prior treatment) HPI Summer [name] is a 52 y.o. female who presents to the Emergency Department complaining of swollen lymph nodes in her neck, abdominal pain, joint pain, and a headache that began two days ago. She recently was treated for a Bell's palsy on the left side. She is concerned she has leukemia as her friend recently died.  PCP Dr. Gerda Diss  Past Medical History  Diagnosis Date  . Fibromyalgia   . Arthritis   . Dysrhythmia, cardiac   . Hypertension   . Bell's palsy     Past Surgical History  Procedure Laterality Date  . Tonsillectomy      Family History  Problem Relation Age of Onset  . Adopted: Yes  . Cancer Mother     lung    History  Substance Use Topics  . Smoking status: Former Games developer  . Smokeless tobacco: Not on file  . Alcohol Use: No    OB History   Grav Para Term Preterm Abortions TAB SAB Ect Mult Living                  Review of Systems  Constitutional: Negative for fever.       10 Systems reviewed and are negative for acute change except as noted in the HPI.  HENT: Negative for congestion.        Swollen glands  Eyes: Negative for discharge and redness.  Respiratory: Negative for cough and shortness of breath.   Cardiovascular: Negative for chest pain.  Gastrointestinal: Positive for abdominal pain. Negative for vomiting.  Musculoskeletal: Negative for back pain.       Joint pain  Skin: Negative for rash.  Neurological: Negative for syncope, numbness and headaches.  Psychiatric/Behavioral:       No behavior change.    Allergies  Bee venom and Shellfish allergy  Home Medications  No current outpatient prescriptions on file.  BP 154/91  Pulse 88  Temp(Src) 98.1 F (36.7  C) (Oral)  Resp 16  Ht 5\' 5"  (1.651 m)  Wt 250 lb (113.399 kg)  BMI 41.6 kg/m2  SpO2 97%  LMP 01/02/2012  Physical Exam  Nursing note and vitals reviewed. Constitutional: She appears well-developed and well-nourished.  Awake, alert, nontoxic appearance.  HENT:  Head: Atraumatic.  Eyes: EOM are normal. Pupils are equal, round, and reactive to light.  Neck: Normal range of motion. Neck supple.  Shoddy anterior chain nodes on the right, tender.  Cardiovascular: Normal rate and intact distal pulses.   Pulmonary/Chest: Effort normal and breath sounds normal. She exhibits no tenderness.  Abdominal: Soft. Bowel sounds are normal. There is no tenderness. There is no rebound.  Musculoskeletal: She exhibits no tenderness.  Baseline ROM, no obvious new focal weakness.  Neurological:  Mental status and motor strength appears baseline for patient and situation.  Skin: No rash noted.  Psychiatric: She has a normal mood and affect.    ED Course  Procedures (including critical care time) Results for orders placed during the hospital encounter of 03/04/13  CBC WITH DIFFERENTIAL      Result Value Range   WBC 8.6  4.0 - 10.5 K/uL   RBC 4.82  3.87 - 5.11 MIL/uL   Hemoglobin 13.6  12.0 - 15.0  g/dL   HCT 16.1  09.6 - 04.5 %   MCV 82.2  78.0 - 100.0 fL   MCH 28.2  26.0 - 34.0 pg   MCHC 34.3  30.0 - 36.0 g/dL   RDW 40.9 (*) 81.1 - 91.4 %   Platelets 318  150 - 400 K/uL   Neutrophils Relative 46  43 - 77 %   Neutro Abs 3.9  1.7 - 7.7 K/uL   Lymphocytes Relative 43  12 - 46 %   Lymphs Abs 3.7  0.7 - 4.0 K/uL   Monocytes Relative 9  3 - 12 %   Monocytes Absolute 0.8  0.1 - 1.0 K/uL   Eosinophils Relative 2  0 - 5 %   Eosinophils Absolute 0.2  0.0 - 0.7 K/uL   Basophils Relative 1  0 - 1 %   Basophils Absolute 0.0  0.0 - 0.1 K/uL   Medications  ketorolac (TORADOL) 30 MG/ML injection 30 mg (30 mg Intravenous Not Given 03/05/13 0123)  sodium chloride 0.9 % bolus 1,000 mL (0 mLs Intravenous  Stopped 03/05/13 0239)     1. Viral illness       MDM  Patient with a variety of c/o that are consistent with a viral illness. Given toradol with relief of pain. Reviewed labs with the patient. Pt stable in ED with no significant deterioration in condition.The patient appears reasonably screened and/or stabilized for discharge and I doubt any other medical condition or other [facility] requiring further screening, evaluation, or treatment in the ED at this time prior to discharge.  MDM Reviewed: nursing note and vitals Interpretation: labs           Nicoletta Dress. Colon Branch, MD 03/05/13 7829

## 2013-03-05 NOTE — Progress Notes (Signed)
  Subjective:    Patient ID: Summer [name], female    DOB: Apr 01, 1961, 52 y.o.   MRN: [id number]  HPIstarted 4 days ago, felt knot in throat, hurts around neck and ears No fevers. Felt weak past few days. C/o myalgias. Some rash. Had tick bite 2 weeks ago . No V or D. Decreased appetite. C/o lower back pain. C/o leg soreness statred yesterday. She is worried about a tick related illness her husband had severe [place] spotted fever several years ago. PMH benign Patient relates that she went to the ER they did not evaluate her other than doing a blood test he did not examine her in the center home told her it was a virus. Patient frustrated by that.   Review of Systems     Objective:   Physical Exam Eardrums normal throat is normal neck no masses lungs are clear heart is regular abdomen soft skin warm dry no rashes were seen       Assessment & Plan:  Possible tick related illness-patient defers on blood testing. Doxycycline twice a day for the next 10 days. If high fevers vomiting wheezing or other issues occur followup sooner. S. A.l.

## 2013-03-16 ENCOUNTER — Telehealth: Payer: Self-pay | Admitting: Family Medicine

## 2013-03-16 MED ORDER — AZITHROMYCIN 250 MG PO TABS: ORAL_TABLET | ORAL | Status: AC

## 2013-03-16 NOTE — Telephone Encounter (Signed)
° °    Pt put on Doxycycline for possible virus or tic bite issue, pt still has sore throat, swollen lymph nodes.. Wants to know since today is her last day of meds and she still not up to par, should she be on another round of Antibiotic? If so, can we call it into [facility] Please and thank you

## 2013-03-16 NOTE — Telephone Encounter (Signed)
Pt put on Doxycycline for possible virus or tic bite issue, pt still has sore throat, swollen lymph nodes.. Wants to know since today is her last day of meds and she still not up to par, should she be on another round of Antibiotic? If so, can we call it into [facility] Please and thank you

## 2013-03-16 NOTE — Telephone Encounter (Signed)
Sent in Zpak to [facility]. Patient was notified if no better in 10 days then NTBS.

## 2013-03-16 NOTE — Telephone Encounter (Signed)
Use z pack if ongoing symptoms not gone in 10 days then NTBS. i doubt tick illness

## 2013-03-20 ENCOUNTER — Ambulatory Visit (INDEPENDENT_AMBULATORY_CARE_PROVIDER_SITE_OTHER): Payer: Self-pay | Admitting: Nurse Practitioner

## 2013-03-20 ENCOUNTER — Encounter: Payer: Self-pay | Admitting: Nurse Practitioner

## 2013-03-20 VITALS — BP 132/98 | Temp 98.4°F | Ht 64.25 in | Wt 278.4 lb

## 2013-03-20 DIAGNOSIS — IMO0001 Reserved for inherently not codable concepts without codable children: Secondary | ICD-10-CM

## 2013-03-20 DIAGNOSIS — R21 Rash and other nonspecific skin eruption: Secondary | ICD-10-CM

## 2013-03-20 DIAGNOSIS — W57XXXA Bitten or stung by nonvenomous insect and other nonvenomous arthropods, initial encounter: Secondary | ICD-10-CM

## 2013-03-20 DIAGNOSIS — M797 Fibromyalgia: Secondary | ICD-10-CM

## 2013-03-20 DIAGNOSIS — E876 Hypokalemia: Secondary | ICD-10-CM

## 2013-03-20 MED ORDER — POTASSIUM CHLORIDE ER 10 MEQ PO TBCR: 10.0000 meq | EXTENDED_RELEASE_TABLET | Freq: Every day | ORAL | Status: DC

## 2013-03-20 MED ORDER — TRIAMCINOLONE ACETONIDE 0.1 % EX CREA: TOPICAL_CREAM | Freq: Two times a day (BID) | CUTANEOUS | Status: DC

## 2013-03-20 MED ORDER — DOXYCYCLINE HYCLATE 100 MG PO CAPS: 100.0000 mg | ORAL_CAPSULE | Freq: Two times a day (BID) | ORAL | Status: DC

## 2013-03-21 ENCOUNTER — Telehealth: Payer: Self-pay | Admitting: Family Medicine

## 2013-03-21 MED ORDER — AMOXICILLIN 500 MG PO CAPS: 500.0000 mg | ORAL_CAPSULE | Freq: Three times a day (TID) | ORAL | Status: DC

## 2013-03-21 NOTE — Telephone Encounter (Signed)
Pt was put on doxycycline by Eber Jones yesterday 03/20/13 (for possible Lyme Disease). Took last nights dosage and woke middle of the night with whelps on her legs. Called PharmD this morning and he told her not to take any further med and call our office. What is your recommendation.

## 2013-03-21 NOTE — Telephone Encounter (Signed)
Amoxil covers lymes , first of all we are to be reminded this is presumed lymes. Amoxil 500 one tid for 3 weeks, if ongoing concern for lymes then next step is referal to infxs disease clinic

## 2013-03-21 NOTE — Telephone Encounter (Signed)
Notified patient and sent in Amoxil 500 one tid for 3 weeks to pharmacy, if ongoing concern for lymes then next step is referal to infxs disease clinic. Patient verbalized understanding.

## 2013-03-22 ENCOUNTER — Telehealth: Payer: Self-pay | Admitting: Family Medicine

## 2013-03-22 ENCOUNTER — Encounter: Payer: Self-pay | Admitting: Nurse Practitioner

## 2013-03-22 DIAGNOSIS — E876 Hypokalemia: Secondary | ICD-10-CM | POA: Insufficient documentation

## 2013-03-22 DIAGNOSIS — M797 Fibromyalgia: Secondary | ICD-10-CM | POA: Insufficient documentation

## 2013-03-22 NOTE — Telephone Encounter (Signed)
In all due kindness, nurses please figure out what bloodwork papers she has currently, secondly we could send her to rheumatology at [facility] (she might qualify for reduced rates). As for blood test for lupus they are very expensive and not always reliable. A sed rate is a good screening test that's not through the roof. We can do that. Otherwise i would rec referral.

## 2013-03-22 NOTE — Telephone Encounter (Signed)
Pt wants to know if she can go ahead with the blood work and if we can test her for lupus at the same time.  She is tired of being sick and she wants to know what is wrong.

## 2013-03-22 NOTE — Assessment & Plan Note (Signed)
Restart K. Dur 10 mEq daily.

## 2013-03-22 NOTE — Telephone Encounter (Signed)
Pt wants to know if she can go ahead with her blood work and if you can test her for Lupus at the same time.  She is tired of being sick and wants to know what is wrong.

## 2013-03-22 NOTE — Progress Notes (Signed)
Subjective:  Presents for c/o migratory muscle aches. Had tick bite in mid April. Completed course of doxycycline for 2 weeks, her symptoms were improving. Was changed to a Z-Pak, symptoms started back again. Overall fatigued. States she "feels bad". Questions whether her symptoms could be related to Lyme disease. No joint pain. No fevers at this point. No headache. Also has a history of hypokalemia. Patient is uninsured. Mild rash up around the neck area for the past few days. Pruritic at times. No other rash is been noted. No known allergens.   Objective:   BP 132/98  Temp(Src) 98.4 F (36.9 C) (Oral)  Ht 5' 4.25" (1.632 m)  Wt 278 lb 6 oz (126.27 kg)  BMI 47.41 kg/m2  LMP 01/02/2012 NAD alert, oriented. Mildly fatigued in appearance. Lungs clear. Heart regular rate rhythm. Faint pink areas from excoriation noted around the upper back upper chest area near the neck, no obvious lesions noted.  Fibromyalgia  Tick bite  Hypokalemia  Rash and nonspecific skin eruption  Plan: Meds ordered this encounter  Medications  . DISCONTD: doxycycline (VIBRAMYCIN) 100 MG capsule    Sig: Take 1 capsule (100 mg total) by mouth 2 (two) times daily.    Dispense:  60 capsule    Refill:  2    Order Specific Question:  Supervising Provider    Answer:  Merlyn Albert [[id number]]  . potassium chloride (K-DUR) 10 MEQ tablet    Sig: Take 1 tablet (10 mEq total) by mouth daily.    Dispense:  30 tablet    Refill:  2    Order Specific Question:  Supervising Provider    Answer:  Merlyn Albert [[id number]]  . triamcinolone cream (KENALOG) 0.1 %    Sig: Apply topically 2 (two) times daily. Prn rash    Dispense:  30 g    Refill:  0    Order Specific Question:  Supervising Provider    Answer:  Merlyn Albert [[id number]]   Patient cannot afford Lyme disease titer. Will do a trial of doxycycline for the next few weeks, call back if symptoms are not resolved. Also restart her daily potassium with her history of  hypokalemia.

## 2013-03-23 ENCOUNTER — Encounter: Payer: Self-pay | Admitting: Family Medicine

## 2013-03-23 ENCOUNTER — Ambulatory Visit (INDEPENDENT_AMBULATORY_CARE_PROVIDER_SITE_OTHER): Payer: Self-pay | Admitting: Family Medicine

## 2013-03-23 VITALS — BP 148/106 | Temp 98.6°F | Wt 275.2 lb

## 2013-03-23 DIAGNOSIS — M797 Fibromyalgia: Secondary | ICD-10-CM

## 2013-03-23 DIAGNOSIS — M2550 Pain in unspecified joint: Secondary | ICD-10-CM

## 2013-03-23 DIAGNOSIS — R109 Unspecified abdominal pain: Secondary | ICD-10-CM

## 2013-03-23 DIAGNOSIS — IMO0001 Reserved for inherently not codable concepts without codable children: Secondary | ICD-10-CM

## 2013-03-23 LAB — CK: Total CK: 54 U/L (ref 7–177)

## 2013-03-23 LAB — POCT URINALYSIS DIPSTICK

## 2013-03-23 LAB — SEDIMENTATION RATE: Sed Rate: 1 mm/hr (ref 0–22)

## 2013-03-23 NOTE — Telephone Encounter (Signed)
Patient states that she is going to talk to her husband about what he thinks she should do and she will call us back with what she decides.

## 2013-03-23 NOTE — Progress Notes (Deleted)
  Subjective:    Patient ID: Summer [name], female    DOB: [birth year], 52 y.o.   MRN: [id number]  HPI    Review of Systems     Objective:   Physical Exam        Assessment & Plan:  Spent in

## 2013-03-23 NOTE — Progress Notes (Signed)
  Subjective:    Patient ID: Summer [name], female    DOB: [birth year], 52 y.o.   MRN: [id number]  Back Pain This is a new problem. The current episode started yesterday. The pain is at a severity of 8/10. The symptoms are aggravated by bending. Associated symptoms include abdominal pain.   This patient unfortunately has a lot of body aches muscle aches arthralgias joint pains she also is worried that she is dying she also states that she has a lot of swelling in the lymph nodes and she finds herself just feeling bad all the time she states she cries a lot she doesn't move around because she feels so bad she is worried about having lupus worried about having an infectious disease she denies being depressed she does have a history of fibromyalgia family history noncontributory   Review of Systems  Gastrointestinal: Positive for abdominal pain.  Musculoskeletal: Positive for back pain.       Objective:   Physical Exam Throat nonerythematous neck she perceives a lot of lymphadenopathy I find very little lungs are clear hearts regular pulse normal skin warm dry neurologic normal abdomen soft tenderness in the arms legs back upper back       Assessment & Plan:  Severe fibromyalgia-try Cymbalta 60 mg daily Will try to get her assistance with getting this medicine We'll do some lab work to look for any possibility of lupus Have offered referral to infectious disease and rheumatology if patient decides to do so they will consider it at the moment I don't think it is absolutely mandatory.

## 2013-03-27 ENCOUNTER — Encounter: Payer: Self-pay | Admitting: Family Medicine

## 2013-03-27 LAB — ANA: Anti Nuclear Antibody(ANA): NEGATIVE

## 2013-03-30 ENCOUNTER — Encounter: Payer: Self-pay | Admitting: Family Medicine

## 2013-03-30 ENCOUNTER — Ambulatory Visit (INDEPENDENT_AMBULATORY_CARE_PROVIDER_SITE_OTHER): Payer: Self-pay | Admitting: Family Medicine

## 2013-03-30 VITALS — BP 162/94 | Temp 98.8°F | Wt 269.0 lb

## 2013-03-30 DIAGNOSIS — K219 Gastro-esophageal reflux disease without esophagitis: Secondary | ICD-10-CM

## 2013-03-30 MED ORDER — RANITIDINE HCL 300 MG PO TABS: 300.0000 mg | ORAL_TABLET | Freq: Every day | ORAL | Status: DC

## 2013-03-30 NOTE — Progress Notes (Signed)
  Subjective:    Patient ID: Summer [name], female    DOB: Mar 17, 1961, 52 y.o.   MRN: [id number]  HPI throa hurting for a couple of weeks. Painful with pressure. Reflux burniing in throat, choking sensation, deep  Pressure worse at night. Aloe vera helped. Long hx. No rx meds Worried about it, scared and panic attacks. Patient also notes right lower cautery abdominal pain worse when turning or stooping or moving. Pt thinks it is a distinct problem.   Ongoing symptoms  Review of Systems ROS otherwise negative.    Objective:   Physical Exam  Alert no acute distress talkative. HEENT normal. Lungs clear. Heart regular rate and rhythm. Abdomen benign. Very slight low right lateral abdominal tenderness. Neck within normal limits      Assessment & Plan:  Impression #1 reflux with element of dysphagia. Complicated considerably by high anxiety. #2 abdominal strain patient reassured. Plan ranitidine 300 mg twice a day in hopes of avoiding high cost medicine. Xanax 0.5 mg short-term prescription #30 one every 6 hours when necessary for choking sensation. Followup with Dr. Lorin Picket as scheduled. WSL

## 2013-04-01 DIAGNOSIS — K219 Gastro-esophageal reflux disease without esophagitis: Secondary | ICD-10-CM | POA: Insufficient documentation

## 2013-04-06 ENCOUNTER — Ambulatory Visit (INDEPENDENT_AMBULATORY_CARE_PROVIDER_SITE_OTHER): Payer: Self-pay | Admitting: Family Medicine

## 2013-04-06 ENCOUNTER — Encounter: Payer: Self-pay | Admitting: Family Medicine

## 2013-04-06 DIAGNOSIS — K219 Gastro-esophageal reflux disease without esophagitis: Secondary | ICD-10-CM

## 2013-04-06 NOTE — Progress Notes (Signed)
  Subjective:    Patient ID: Summer [name], female    DOB: February 17, 1961, 52 y.o.   MRN: [id number]  Sore Throat  This is a recurrent problem. The current episode started 1 to 4 weeks ago. The problem has been gradually worsening. The pain is worse on the left side. Associated symptoms include shortness of breath and trouble swallowing. She has tried nothing for the symptoms.  some burning tried zantac seemed to help a little     Review of Systems  HENT: Positive for trouble swallowing.   Respiratory: Positive for shortness of breath.    Patient states that the burning in the back throat doing better but she still feels like something is in her throat and feels like it's pushing for the left side she is worried that she may have cancer she states she's had reflux off and on for years. No coughing up of blood no hoarseness. Does not wake her up in the middle of the night.    Objective:   Physical Exam  Throat is normal in appearance there are no masses or lung nodules felt eardrums normal lungs clear hearts regular      Assessment & Plan:  Pharyngitis//probable laryngeal irritation from reflux-omeprazole twice daily for the next 2 weeks then once daily thereafter. Stop Zantac. In addition to this if not doing well within the next 2 weeks notify us and we will do recommend setting her up with ear nose throat doctor for evaluation. Possibly at [facility].

## 2013-04-06 NOTE — Patient Instructions (Signed)
Omeprazole 20 mg one twice daily for 2 weeks then reduce to once daily Stop ranitidine Give [place] an update in 2 weeks ( ENT or not ) Diet for Gastroesophageal Reflux Disease, Adult Reflux (acid reflux) is when acid from your stomach flows up into the esophagus. When acid comes in contact with the esophagus, the acid causes irritation and soreness (inflammation) in the esophagus. When reflux happens often or so severely that it causes damage to the esophagus, it is called gastroesophageal reflux disease (GERD). Nutrition therapy can help ease the discomfort of GERD. FOODS OR DRINKS TO AVOID OR LIMIT  Smoking or chewing tobacco. Nicotine is one of the most potent stimulants to acid production in the gastrointestinal tract.  Caffeinated and decaffeinated coffee and black tea.  Regular or low-calorie carbonated beverages or energy drinks (caffeine-free carbonated beverages are allowed).   Strong spices, such as black pepper, white pepper, red pepper, cayenne, curry powder, and chili powder.  Peppermint or spearmint.  Chocolate.  High-fat foods, including meats and fried foods. Extra added fats including oils, butter, salad dressings, and nuts. Limit these to less than 8 tsp per day.  Fruits and vegetables if they are not tolerated, such as citrus fruits or tomatoes.  Alcohol.  Any food that seems to aggravate your condition. If you have questions regarding your diet, call your caregiver or a registered dietitian. OTHER THINGS THAT MAY HELP GERD INCLUDE:   Eating your meals slowly, in a relaxed setting.  Eating 5 to 6 small meals per day instead of 3 large meals.  Eliminating food for a period of time if it causes distress.  Not lying down until 3 hours after eating a meal.  Keeping the head of your bed raised 6 to 9 inches (15 to 23 cm) by using a foam wedge or blocks under the legs of the bed. Lying flat may make symptoms worse.  Being physically active. Weight loss may be helpful  in reducing reflux in overweight or obese adults.  Wear loose fitting clothing EXAMPLE MEAL PLAN This meal plan is approximately 2,000 calories based on https://www.bernard.org/ meal planning guidelines. Breakfast   cup cooked oatmeal.  1 cup strawberries.  1 cup low-fat milk.  1 oz almonds. Snack  1 cup cucumber slices.  6 oz yogurt (made from low-fat or fat-free milk). Lunch  2 slice whole-wheat bread.  2 oz sliced [place].  2 tsp mayonnaise.  1 cup blueberries.  1 cup snap peas. Snack  6 whole-wheat crackers.  1 oz string cheese. Dinner   cup brown rice.  1 cup mixed veggies.  1 tsp olive oil.  3 oz grilled fish. Document Released: 10/18/2005 Document Revised: 01/10/2012 Document Reviewed: 09/03/2011 [facility] Patient Information 2014 [place], [place].

## 2013-04-13 ENCOUNTER — Ambulatory Visit: Payer: Self-pay | Admitting: Nurse Practitioner

## 2013-04-20 ENCOUNTER — Other Ambulatory Visit: Payer: Self-pay | Admitting: Family Medicine

## 2013-04-20 ENCOUNTER — Telehealth: Payer: Self-pay | Admitting: Family Medicine

## 2013-04-20 DIAGNOSIS — R1310 Dysphagia, unspecified: Secondary | ICD-10-CM

## 2013-04-20 NOTE — Telephone Encounter (Signed)
Nurse/Dr. Lorin Picket  Brief symptoms: medication has helped some, throat still hurts (on a scale level 1-10, her pain level is at 4), with eating the patient is still choking on her food.  The patient would like to go ahead with being referred to an ENT.

## 2013-04-20 NOTE — Telephone Encounter (Signed)
Patient wants to go to ENT that Dr. Lorin Picket told her about that helps people with no insurance. Dr. Lorin Picket was notified and process referral.

## 2013-04-20 NOTE — Telephone Encounter (Signed)
Sure, referal put in. [facility] ent or local?Once pt says-plz respond then our stafff will call back once done

## 2013-04-27 ENCOUNTER — Telehealth: Payer: Self-pay | Admitting: Family Medicine

## 2013-04-27 NOTE — Telephone Encounter (Signed)
Pt called to check on [facility] ENT referral, gave appt info (06/01/13), pt don't want to wait that long, wants to see Dr. Suszanne Conners in [place], called and set up appt, notified pt of appt, records faxed.  Appt is 04/30/13 @ 2:00 w/Dr. Suszanne Conners in [place] office.

## 2013-07-29 ENCOUNTER — Emergency Department (HOSPITAL_COMMUNITY)
Admission: EM | Admit: 2013-07-29 | Discharge: 2013-07-29 | Disposition: A | Discharge status: Home / Self Care | Payer: Self-pay | Attending: Emergency Medicine | Admitting: Emergency Medicine

## 2013-07-29 ENCOUNTER — Encounter (HOSPITAL_COMMUNITY): Payer: Self-pay | Admitting: *Deleted

## 2013-07-29 DIAGNOSIS — E669 Obesity, unspecified: Secondary | ICD-10-CM | POA: Insufficient documentation

## 2013-07-29 DIAGNOSIS — Z3202 Encounter for pregnancy test, result negative: Secondary | ICD-10-CM | POA: Insufficient documentation

## 2013-07-29 DIAGNOSIS — R51 Headache: Secondary | ICD-10-CM | POA: Insufficient documentation

## 2013-07-29 DIAGNOSIS — I10 Essential (primary) hypertension: Secondary | ICD-10-CM | POA: Insufficient documentation

## 2013-07-29 DIAGNOSIS — Z87891 Personal history of nicotine dependence: Secondary | ICD-10-CM | POA: Insufficient documentation

## 2013-07-29 DIAGNOSIS — Z8739 Personal history of other diseases of the musculoskeletal system and connective tissue: Secondary | ICD-10-CM | POA: Insufficient documentation

## 2013-07-29 DIAGNOSIS — Z8669 Personal history of other diseases of the nervous system and sense organs: Secondary | ICD-10-CM | POA: Insufficient documentation

## 2013-07-29 DIAGNOSIS — N390 Urinary tract infection, site not specified: Secondary | ICD-10-CM | POA: Insufficient documentation

## 2013-07-29 DIAGNOSIS — IMO0001 Reserved for inherently not codable concepts without codable children: Secondary | ICD-10-CM | POA: Insufficient documentation

## 2013-07-29 DIAGNOSIS — R109 Unspecified abdominal pain: Secondary | ICD-10-CM

## 2013-07-29 DIAGNOSIS — Z79899 Other long term (current) drug therapy: Secondary | ICD-10-CM | POA: Insufficient documentation

## 2013-07-29 DIAGNOSIS — Z792 Long term (current) use of antibiotics: Secondary | ICD-10-CM | POA: Insufficient documentation

## 2013-07-29 LAB — COMPREHENSIVE METABOLIC PANEL
ALT: 17 U/L (ref 0–35)
AST: 23 U/L (ref 0–37)
Albumin: 4 g/dL (ref 3.5–5.2)
BUN: 12 mg/dL (ref 6–23)
Calcium: 10 mg/dL (ref 8.4–10.5)
GFR calc Af Amer: >90 mL/min (ref >90)
Glucose, Bld: 97 mg/dL (ref 70–99)
Sodium: 139 mEq/L (ref 135–145)
Total Bilirubin: 0.5 mg/dL (ref 0.3–1.2)
Total Protein: 7.2 g/dL (ref 6.0–8.3)

## 2013-07-29 LAB — URINE MICROSCOPIC-ADD ON

## 2013-07-29 LAB — CBC WITH DIFFERENTIAL/PLATELET
Basophils Relative: 1 % (ref 0–1)
Eosinophils Absolute: 0.2 10*3/uL (ref 0.0–0.7)
Eosinophils Relative: 3 % (ref 0–5)
Hemoglobin: 14.3 g/dL (ref 12.0–15.0)
Lymphs Abs: 2.7 10*3/uL (ref 0.7–4.0)
MCH: 28.3 pg (ref 26.0–34.0)
MCHC: 33.3 g/dL (ref 30.0–36.0)
MCV: 84.8 fL (ref 78.0–100.0)
Monocytes Absolute: 0.5 10*3/uL (ref 0.1–1.0)
Monocytes Relative: 9 % (ref 3–12)
Neutrophils Relative %: 45 % (ref 43–77)
Platelets: 290 10*3/uL (ref 150–400)
RBC: 5.06 MIL/uL (ref 3.87–5.11)

## 2013-07-29 LAB — URINALYSIS, ROUTINE W REFLEX MICROSCOPIC
Bilirubin Urine: NEGATIVE
Ketones, ur: 40 mg/dL — AB
Protein, ur: NEGATIVE mg/dL
Urobilinogen, UA: 0.2 mg/dL (ref 0.0–1.0)

## 2013-07-29 MED ORDER — CEPHALEXIN 500 MG PO CAPS: 500.0000 mg | ORAL_CAPSULE | Freq: Two times a day (BID) | ORAL | Status: DC

## 2013-07-29 MED ORDER — OXYCODONE-ACETAMINOPHEN 5-325 MG PO TABS
1.0000 | ORAL_TABLET | Freq: Once | ORAL | Status: DC
  Filled 2013-07-29: qty 1

## 2013-07-29 NOTE — ED Notes (Signed)
Pt c/o abd pain that started three days ago, pt denies any n/v/d, constipation, urinary symptoms, fever or chills,

## 2013-07-29 NOTE — ED Provider Notes (Signed)
CSN: [id number]     Arrival date & time 07/29/13  1816 History   First MD Initiated Contact with Patient 07/29/13 1841     Chief Complaint  Patient presents with  . Abdominal Pain   (Consider location/radiation/quality/duration/timing/severity/associated sxs/prior Treatment) HPI  This is a 52 year old female who presents with abdominal pain. Patient reports diffuse abdominal pain for the last 3 days. She states it hurts when she bends over or when she moves. She states it hurts when her grandson jumped on her abdomen. She has had normal bowel movements with 2 bowel movements today. She endorses nausea without vomiting. She denies any fevers, urinary symptoms, vaginal discharge. Patient rates her pain at 4/10.  Past Medical History  Diagnosis Date  . Fibromyalgia   . Arthritis   . Dysrhythmia, cardiac   . Hypertension   . Bell's palsy    Past Surgical History  Procedure Laterality Date  . Tonsillectomy     Family History  Problem Relation Age of Onset  . Adopted: Yes  . Cancer Mother     lung   History  Substance Use Topics  . Smoking status: Former Games developer  . Smokeless tobacco: Not on file  . Alcohol Use: No   OB History   Grav Para Term Preterm Abortions TAB SAB Ect Mult Living                 Review of Systems  Constitutional: Negative for fever.  Respiratory: Negative for cough, chest tightness and shortness of breath.   Cardiovascular: Negative for chest pain.  Gastrointestinal: Positive for nausea and abdominal pain. Negative for vomiting, diarrhea and constipation.  Genitourinary: Negative for dysuria.  Musculoskeletal: Negative for back pain.  Skin: Negative for rash.  Neurological: Positive for headaches.  All other systems reviewed and are negative.    Allergies  Bee venom and Shellfish allergy  Home Medications   Current Outpatient Rx  Name  Route  Sig  Dispense  Refill  . omeprazole (PRILOSEC) 20 MG capsule   Oral   Take 20 mg by mouth daily.         . cephALEXin (KEFLEX) 500 MG capsule   Oral   Take 1 capsule (500 mg total) by mouth 2 (two) times daily.   14 capsule   0    BP 151/86  Pulse 90  Temp(Src) 98.2 F (36.8 C) (Oral)  Resp 19  Ht 5\' 5"  (1.651 m)  Wt 250 lb (113.399 kg)  BMI 41.6 kg/m2  SpO2 100%  LMP 01/02/2012 Physical Exam  Nursing note and vitals reviewed. Constitutional: She is oriented to person, place, and time. She appears well-developed and well-nourished. No distress.  Obese  HENT:  Head: Normocephalic and atraumatic.  Cardiovascular: Normal rate, regular rhythm and normal heart sounds.   No murmur heard. Pulmonary/Chest: Effort normal. No respiratory distress. She has no wheezes.  Abdominal: Soft. Bowel sounds are normal. She exhibits no distension. There is no tenderness.  Musculoskeletal:  Trace bilateral lower extremity edema  Neurological: She is alert and oriented to person, place, and time.  Skin: Skin is warm and dry.  Psychiatric: She has a normal mood and affect.    ED Course  Procedures (including critical care time) Labs Review Labs Reviewed  URINALYSIS, ROUTINE W REFLEX MICROSCOPIC - Abnormal; Notable for the following:    APPearance HAZY (*)    Specific Gravity, Urine >1.030 (*)    Hgb urine dipstick TRACE (*)    Ketones, ur 40 (*)  All other components within normal limits  CBC WITH DIFFERENTIAL - Abnormal; Notable for the following:    RDW 16.5 (*)    All other components within normal limits  URINE MICROSCOPIC-ADD ON - Abnormal; Notable for the following:    Squamous Epithelial / LPF FEW (*)    Bacteria, UA MANY (*)    All other components within normal limits  URINE CULTURE  PREGNANCY, URINE  COMPREHENSIVE METABOLIC PANEL   Imaging Review No results found.  MDM   1. Urinary tract infection   2. Abdominal pain    This is a 52 year old female who presents with diffuse abdominal pain without lateralizing signs. She denies any associated symptoms with the  exception of nausea. Her vital signs are within normal limits and her exam is benign. Lab work is only notable for bacteria in the urine is nitrite negative. Her urine is difficult to assess because it appears somewhat dirty. Patient will be treated for urinary tract infection as this may be the cause of her symptoms. On repeat abdominal exam, patient continues to have a benign abdomen without peritoneal signs. The patient asked me if this could be uterine cancer. She has a history of uterine cancer her family. She denies any vaginal bleeding or discharge. I discussed with her that I do not have access to ultrasound tonight and cannot further evaluate this at that time. She has a primary care physician and I feel this appropriate for outpatient workup. The patient agrees. She's given strict return precautions. She is to followup with primary care physician tomorrow.  After history, exam, and medical workup I feel the patient has been appropriately medically screened and is safe for discharge home. Pertinent diagnoses were discussed with the patient. Patient was given return precautions.     Shon Baton, MD 07/29/13 2052

## 2013-07-31 LAB — URINE CULTURE: Colony Count: 40000

## 2013-08-01 ENCOUNTER — Encounter: Payer: Self-pay | Admitting: Family Medicine

## 2013-08-01 ENCOUNTER — Ambulatory Visit (INDEPENDENT_AMBULATORY_CARE_PROVIDER_SITE_OTHER): Payer: Self-pay | Admitting: Family Medicine

## 2013-08-01 VITALS — BP 154/96 | Temp 98.6°F | Ht 65.0 in | Wt 269.4 lb

## 2013-08-01 DIAGNOSIS — R1013 Epigastric pain: Secondary | ICD-10-CM

## 2013-08-01 LAB — POCT URINALYSIS DIPSTICK
Spec Grav, UA: 1.015
pH, UA: 7.5

## 2013-08-01 MED ORDER — DICYCLOMINE HCL 20 MG PO TABS: 20.0000 mg | ORAL_TABLET | Freq: Three times a day (TID) | ORAL | Status: DC | PRN

## 2013-08-01 NOTE — Progress Notes (Signed)
  Subjective:    Patient ID: Summer [name], female    DOB: 07-12-1961, 52 y.o.   MRN: [id number]  Abdominal Pain This is a new problem. The current episode started in the past 7 days. The problem occurs intermittently. The problem has been gradually worsening. The pain is located in the generalized abdominal region. The quality of the pain is cramping and sharp. The abdominal pain radiates to the back. Associated symptoms include diarrhea and nausea. The pain is aggravated by palpation, movement and eating. Relieved by: Eating a blad diet. Treatments tried: [place] and Acidolpholous. The treatment provided mild relief.   5 days ago began, worse with pressure and eating, some diarrhea-watery No fever, no vomiting, lower back apin last night, no dysuria ER on Sunday put her Keflex due to possible UTI  PMH benign. Has a history of reflux. Also fibromyalgia. Review of Systems  Gastrointestinal: Positive for nausea, abdominal pain and diarrhea.       Objective:   Physical Exam  Vitals reviewed. Constitutional: She appears well-developed and well-nourished.  HENT:  Head: Normocephalic.  Right Ear: External ear normal.  Left Ear: External ear normal.  Cardiovascular: Normal rate, regular rhythm and normal heart sounds.   No murmur heard. Pulmonary/Chest: Effort normal and breath sounds normal. She has no wheezes. She has no rales.  Abdominal: Soft. She exhibits no distension. There is no rebound and no guarding.  Lymphadenopathy:    She has no cervical adenopathy.  Skin: Skin is warm and dry.    154/96      Assessment & Plan:  #1 abdominal pain-nonspecific. Patient with some diarrhea and abdominal cramping will try bentyl 20 mg  3 times a day when necessary in addition to this bloody stools high fever worse may need other intervention. I don't feel patient needs CAT scan at this present moment. Her white count in the ER was normal her urine today looks from patient's blood pressure  is elevated she states on previous checks even at the ER it was normal she was encouraged to watch salt in diet stay physically active.

## 2013-08-01 NOTE — Patient Instructions (Signed)
Stop Keflex  Continue bland diet today  Try bentyl for abd pain  If not better by Monday call

## 2013-08-03 ENCOUNTER — Telehealth: Payer: Self-pay | Admitting: Family Medicine

## 2013-08-03 NOTE — Telephone Encounter (Signed)
Spoke with pt about: she was told by Dr Lorin Picket at her last visit to give [place] a call if her back pain was not better. She says her stomach pain is about the same, but back pain is worse. She denied any fever, vomiting, diarrhea, she has an appetite and pain is not waking her up at night. I transferred to the front and she has an appt Monday morning with Eber Jones for a f/u.

## 2013-08-03 NOTE — Telephone Encounter (Signed)
Any fever? Any vomiting? Appetite? Pain waking her up at night? Diarrhea?

## 2013-08-03 NOTE — Telephone Encounter (Signed)
Patient says she was told by Dr Lorin Picket at her last visit to give [place] a call if her back pain was not better. She says her stomach pain is about the same, but back pain is worse. Please advise.

## 2013-08-06 ENCOUNTER — Ambulatory Visit (INDEPENDENT_AMBULATORY_CARE_PROVIDER_SITE_OTHER): Payer: Self-pay | Admitting: Nurse Practitioner

## 2013-08-06 ENCOUNTER — Encounter: Payer: Self-pay | Admitting: Nurse Practitioner

## 2013-08-06 VITALS — BP 130/90 | Ht 65.0 in | Wt 262.0 lb

## 2013-08-06 DIAGNOSIS — R102 Pelvic and perineal pain: Secondary | ICD-10-CM

## 2013-08-06 DIAGNOSIS — M549 Dorsalgia, unspecified: Secondary | ICD-10-CM

## 2013-08-06 DIAGNOSIS — R109 Unspecified abdominal pain: Secondary | ICD-10-CM

## 2013-08-06 LAB — POCT URINALYSIS DIPSTICK
Ketones, UA: POSITIVE
Spec Grav, UA: <=1.005

## 2013-08-06 LAB — POCT UA - MICROSCOPIC ONLY
Bacteria, U Microscopic: 0
Mucus, UA: 0
WBC, Ur, HPF, POC: 0

## 2013-08-06 LAB — POCT URINE PREGNANCY: Preg Test, Ur: NEGATIVE

## 2013-08-08 ENCOUNTER — Encounter: Payer: Self-pay | Admitting: Nurse Practitioner

## 2013-08-08 NOTE — Progress Notes (Signed)
Subjective:  Presents with complaints of localized low back pain over the past week. No specific history of injury Also having occasional sharp pains going along the lower abdomen. No fevers. Some generalized abdominal bloating after eating, unassociated with any particular foods. Has been eating some high-fiber foods. Has been working on her weight loss, has lost 7 pounds. Patient is adopted but found out she does have a family history of ovarian cancer. States she's got it in her head that she has this. Has not had a physical in years, does not have any insurance. Last menstrual cycle was about a year ago, has not had any bleeding or spotting since. Some urinary urgency but no dysuria. Slight vaginal discharge. A white discharge with no itching or burning. Married, same sexual partner but limited sexual activity due to patient and her husband's health issues. Is still on Keflex that was given to her at the emergency room on 9/28 for UTI. Was seen in our office on 10/1 for nonspecific, abd pain and placed on Bentyl for abdominal cramping. No further diarrhea. No acid reflux or heartburn.  Objective:   BP 130/90  Ht 5\' 5"  (1.651 m)  Wt 262 lb (118.842 kg)  BMI 43.6 kg/m2  LMP 01/02/2012 NAD. Alert, oriented. Lungs clear. No CVA area tenderness. Heart regular rate rhythm. Abdomen soft obese nondistended with active bowel sounds x4; generalized lower abdominal tenderness more towards the pelvic area. Vagina pink and moist, no discharge noted. No CMT. Bimanual exam limited due to abdominal girth. No obvious masses but generalized tenderness. Urine microscopic negative. Urine pregnancy test negative. Generalized tenderness in the lumbar sacral area. SLR negative bilateral. Reflexes normal limit lower extremities.  Assessment:Back pain - Plan: POCT urinalysis dipstick  Abdominal  pain, other specified site - Plan: POCT urine pregnancy  Pelvic pain - Plan: US Pelvis Complete, POCT UA - Microscopic  Only  Plan: Further followup based on the pelvic ultrasound report. Strongly recommend preventive health physical. Reviewed warning signs. Call back if symptoms worsen or persist.

## 2013-08-09 ENCOUNTER — Ambulatory Visit (HOSPITAL_COMMUNITY)
Admission: RE | Admit: 2013-08-09 | Discharge: 2013-08-09 | Disposition: A | Discharge status: Home / Self Care | Payer: Self-pay | Source: Ambulatory Visit | Attending: Nurse Practitioner | Admitting: Nurse Practitioner

## 2013-08-09 ENCOUNTER — Other Ambulatory Visit: Payer: Self-pay | Admitting: Nurse Practitioner

## 2013-08-09 DIAGNOSIS — N72 Inflammatory disease of cervix uteri: Secondary | ICD-10-CM | POA: Insufficient documentation

## 2013-08-09 DIAGNOSIS — R102 Pelvic and perineal pain: Secondary | ICD-10-CM

## 2013-08-09 DIAGNOSIS — N949 Unspecified condition associated with female genital organs and menstrual cycle: Secondary | ICD-10-CM | POA: Insufficient documentation

## 2013-08-20 ENCOUNTER — Ambulatory Visit: Payer: Self-pay | Admitting: Nurse Practitioner

## 2013-08-29 ENCOUNTER — Ambulatory Visit: Payer: Self-pay | Admitting: Nurse Practitioner

## 2013-09-03 ENCOUNTER — Ambulatory Visit: Payer: Self-pay | Admitting: Nurse Practitioner

## 2013-09-06 ENCOUNTER — Telehealth: Payer: Self-pay | Admitting: Nurse Practitioner

## 2013-09-06 NOTE — Telephone Encounter (Signed)
Noted  

## 2013-09-06 NOTE — Telephone Encounter (Signed)
Patient wanted to let Eber Jones know that she is going to come in for a followup appointment as soon as she can. He husband has lost his job and since she is self-pay it is hard for her to come.

## 2013-09-07 ENCOUNTER — Ambulatory Visit: Payer: Self-pay | Admitting: Nurse Practitioner

## 2013-10-23 ENCOUNTER — Ambulatory Visit (INDEPENDENT_AMBULATORY_CARE_PROVIDER_SITE_OTHER): Payer: Self-pay | Admitting: Family Medicine

## 2013-10-23 ENCOUNTER — Encounter: Payer: Self-pay | Admitting: Family Medicine

## 2013-10-23 VITALS — BP 132/84 | Temp 98.5°F | Ht 65.0 in | Wt 263.1 lb

## 2013-10-23 DIAGNOSIS — M25559 Pain in unspecified hip: Secondary | ICD-10-CM

## 2013-10-23 DIAGNOSIS — R102 Pelvic and perineal pain: Secondary | ICD-10-CM

## 2013-10-23 MED ORDER — ALPRAZOLAM 0.5 MG PO TABS: 0.5000 mg | ORAL_TABLET | Freq: Three times a day (TID) | ORAL | Status: DC | PRN

## 2013-10-23 MED ORDER — ETODOLAC 400 MG PO TABS: 400.0000 mg | ORAL_TABLET | Freq: Two times a day (BID) | ORAL | Status: DC

## 2013-10-23 NOTE — Progress Notes (Signed)
   Subjective:    Patient ID: Summer [name], female    DOB: 1960-12-20, 52 y.o.   MRN: [id number]  Abdominal Pain This is a new problem. The current episode started more than 1 month ago. The onset quality is gradual. The problem occurs intermittently. The problem has been gradually worsening. The pain is located in the generalized abdominal region. The pain is at a severity of 6/10. The pain is moderate. Quality: pressure. The abdominal pain does not radiate. Nothing aggravates the pain. The pain is relieved by nothing. She has tried antacids for the symptoms. The treatment provided no relief. Prior diagnostic workup includes ultrasound.  Patient had abdominal US done a couple of months ago and needs the results of this test.  Overall energy level overall doing pretty good Having intermittent lower abdominal pain  Had ultrasound several weeks ago Review of Systems  Gastrointestinal: Positive for abdominal pain.   patient denies chest pain shortness breath wheezing Ultrasound results were reviewed with patient in detail    Objective:   Physical Exam  Her lungs are clear hearts regular lower pelvic area mild tenderness no guarding or rebound      Assessment & Plan:  Thickened uterine stripe referral to gynecology may need endometrial biopsy.

## 2013-11-07 ENCOUNTER — Encounter: Payer: Self-pay | Admitting: Obstetrics and Gynecology

## 2013-11-09 ENCOUNTER — Emergency Department (HOSPITAL_COMMUNITY)
Admission: EM | Admit: 2013-11-09 | Discharge: 2013-11-09 | Disposition: A | Discharge status: Home / Self Care | Payer: BC Managed Care – PPO | Attending: Emergency Medicine | Admitting: Emergency Medicine

## 2013-11-09 ENCOUNTER — Emergency Department (HOSPITAL_COMMUNITY): Payer: BC Managed Care – PPO

## 2013-11-09 DIAGNOSIS — Z8669 Personal history of other diseases of the nervous system and sense organs: Secondary | ICD-10-CM | POA: Insufficient documentation

## 2013-11-09 DIAGNOSIS — E669 Obesity, unspecified: Secondary | ICD-10-CM | POA: Insufficient documentation

## 2013-11-09 DIAGNOSIS — Z87891 Personal history of nicotine dependence: Secondary | ICD-10-CM | POA: Insufficient documentation

## 2013-11-09 DIAGNOSIS — M129 Arthropathy, unspecified: Secondary | ICD-10-CM | POA: Insufficient documentation

## 2013-11-09 DIAGNOSIS — R1084 Generalized abdominal pain: Secondary | ICD-10-CM | POA: Insufficient documentation

## 2013-11-09 DIAGNOSIS — R1033 Periumbilical pain: Secondary | ICD-10-CM | POA: Insufficient documentation

## 2013-11-09 DIAGNOSIS — I10 Essential (primary) hypertension: Secondary | ICD-10-CM | POA: Insufficient documentation

## 2013-11-09 DIAGNOSIS — Z791 Long term (current) use of non-steroidal anti-inflammatories (NSAID): Secondary | ICD-10-CM | POA: Insufficient documentation

## 2013-11-09 DIAGNOSIS — Z79899 Other long term (current) drug therapy: Secondary | ICD-10-CM | POA: Insufficient documentation

## 2013-11-09 DIAGNOSIS — R109 Unspecified abdominal pain: Secondary | ICD-10-CM

## 2013-11-09 LAB — CBC WITH DIFFERENTIAL/PLATELET
BASOS ABS: 0 10*3/uL (ref 0.0–0.1)
BASOS PCT: 0 % (ref 0–1)
EOS ABS: 0.2 10*3/uL (ref 0.0–0.7)
EOS PCT: 2 % (ref 0–5)
HEMATOCRIT: 43 % (ref 36.0–46.0)
HEMOGLOBIN: 14.4 g/dL (ref 12.0–15.0)
Lymphocytes Relative: 39 % (ref 12–46)
Lymphs Abs: 4 10*3/uL (ref 0.7–4.0)
MCH: 28.7 pg (ref 26.0–34.0)
MCHC: 33.5 g/dL (ref 30.0–36.0)
MCV: 85.8 fL (ref 78.0–100.0)
MONO ABS: 0.9 10*3/uL (ref 0.1–1.0)
MONOS PCT: 9 % (ref 3–12)
Neutro Abs: 5.1 10*3/uL (ref 1.7–7.7)
Neutrophils Relative %: 50 % (ref 43–77)
Platelets: 298 10*3/uL (ref 150–400)
RBC: 5.01 MIL/uL (ref 3.87–5.11)
RDW: 17.2 % — AB (ref 11.5–15.5)
WBC: 10.3 10*3/uL (ref 4.0–10.5)

## 2013-11-09 LAB — URINALYSIS, ROUTINE W REFLEX MICROSCOPIC
GLUCOSE, UA: NEGATIVE mg/dL
KETONES UR: 40 mg/dL — AB
Leukocytes, UA: NEGATIVE
Nitrite: NEGATIVE
PH: 6 (ref 5.0–8.0)
Protein, ur: NEGATIVE mg/dL
Specific Gravity, Urine: 1.03 (ref 1.005–1.030)
Urobilinogen, UA: 0.2 mg/dL (ref 0.0–1.0)

## 2013-11-09 LAB — COMPREHENSIVE METABOLIC PANEL
ALBUMIN: 4.2 g/dL (ref 3.5–5.2)
ALT: 15 U/L (ref 0–35)
AST: 19 U/L (ref 0–37)
Alkaline Phosphatase: 94 U/L (ref 39–117)
BUN: 15 mg/dL (ref 6–23)
CALCIUM: 9.6 mg/dL (ref 8.4–10.5)
CO2: 23 mEq/L (ref 19–32)
CREATININE: 0.68 mg/dL (ref 0.50–1.10)
Chloride: 103 mEq/L (ref 96–112)
GFR calc Af Amer: >90 mL/min (ref >90)
GFR calc non Af Amer: >90 mL/min (ref >90)
Glucose, Bld: 99 mg/dL (ref 70–99)
Potassium: 3.4 mEq/L — ABNORMAL LOW (ref 3.7–5.3)
Sodium: 142 mEq/L (ref 137–147)
TOTAL PROTEIN: 7.8 g/dL (ref 6.0–8.3)
Total Bilirubin: 0.5 mg/dL (ref 0.3–1.2)

## 2013-11-09 LAB — URINE MICROSCOPIC-ADD ON

## 2013-11-09 MED ORDER — DICYCLOMINE HCL 20 MG PO TABS: 20.0000 mg | ORAL_TABLET | Freq: Three times a day (TID) | ORAL | Status: DC | PRN

## 2013-11-09 NOTE — ED Provider Notes (Signed)
CSN: [id number]     Arrival date & time 11/09/13  4098 History   First MD Initiated Contact with Patient 11/09/13 [phone]     Chief Complaint  Patient presents with  . Abdominal Pain    pain for over a month and getting worse. did not have inurance  at the time   (Consider location/radiation/quality/duration/timing/severity/associated sxs/prior Treatment) HPI Comments: TEPHANIE ESCORCIA is a 53 y.o. female who states that she has had abdominal pain for one month. The pain waxes and wanes and comes and goes. There are no specific provoking factors. 2 weeks ago, she saw her PCP, for pelvic pain and was evaluated with a pelvic ultrasound. After that she was referred to a gynecologist. Currently, she is being bothered by peri-umbilical pain. She had episode of diarrhea yesterday after eating [place] food. There has been no other diarrhea, nausea, vomiting, fever, chills, dysuria, urinary frequency, cough, shortness of breath, or chest pain, associated with the pain for the last month. No preceding abdominal surgeries. She is using her usual medications, without relief. Last menstrual period one year ago. There are no other known modifying factors.   Patient is a 53 y.o. female presenting with abdominal pain. The history is provided by the patient.  Abdominal Pain   Past Medical History  Diagnosis Date  . Fibromyalgia   . Arthritis   . Dysrhythmia, cardiac   . Hypertension   . Bell's palsy    Past Surgical History  Procedure Laterality Date  . Tonsillectomy     Family History  Problem Relation Age of Onset  . Adopted: Yes  . Cancer Mother     lung   History  Substance Use Topics  . Smoking status: Former Research scientist (life sciences)  . Smokeless tobacco: Not on file  . Alcohol Use: No   OB History   Grav Para Term Preterm Abortions TAB SAB Ect Mult Living                 Review of Systems  Gastrointestinal: Positive for abdominal pain.  All other systems reviewed and are negative.    Allergies   Bee venom and Shellfish allergy  Home Medications   Current Outpatient Rx  Name  Route  Sig  Dispense  Refill  . ALPRAZolam (XANAX) 0.5 MG tablet   Oral   Take 1 tablet (0.5 mg total) by mouth 3 (three) times daily as needed for sleep or anxiety.   30 tablet   0   . dicyclomine (BENTYL) 20 MG tablet   Oral   Take 1 tablet (20 mg total) by mouth 3 (three) times daily as needed (abdominal pain).   30 tablet   0   . etodolac (LODINE) 400 MG tablet   Oral   Take 1 tablet (400 mg total) by mouth 2 (two) times daily.   60 tablet   3     Stop ibuprofen   . omeprazole (PRILOSEC) 20 MG capsule   Oral   Take 20 mg by mouth daily.          BP 151/82  Pulse 95  Temp(Src) 97.7 F (36.5 C) (Oral)  Resp 20  Ht 5\' 5"  (1.651 m)  Wt 260 lb (117.935 kg)  BMI 43.27 kg/m2  SpO2 97%  LMP 01/02/2012 Physical Exam  Nursing note and vitals reviewed. Constitutional: She is oriented to person, place, and time. She appears well-developed.  Obese, unkempt  HENT:  Head: Normocephalic and atraumatic.  Eyes: Conjunctivae and EOM are normal.  Pupils are equal, round, and reactive to light.  Neck: Normal range of motion and phonation normal. Neck supple.  Cardiovascular: Normal rate, regular rhythm and intact distal pulses.   Pulmonary/Chest: Effort normal and breath sounds normal. She exhibits no tenderness.  Abdominal: Soft. She exhibits no distension. There is tenderness (Minimal diffuse, periumbilical tenderness without rebound, tenderness, mass or deformity). There is no guarding.  Musculoskeletal: Normal range of motion.  Neurological: She is alert and oriented to person, place, and time. She exhibits normal muscle tone.  Skin: Skin is warm and dry.  Psychiatric: She has a normal mood and affect. Her behavior is normal. Judgment and thought content normal.    ED Course  Procedures (including critical care time)  Medications - No data to display  Patient Vitals for the past 24  hrs:  BP Temp Temp src Pulse Resp SpO2 Height Weight  11/09/13 0333 151/82 mmHg 97.7 F (36.5 C) Oral 95 20 97 % 5\' 5"  (1.651 m) 260 lb (117.935 kg)    5:23 AM Reevaluation with update and discussion. After initial assessment and treatment, an updated evaluation reveals she remains comfortable. She states that she is aware that her PCP has scheduled an appointment with a gynecologist to evaluate her pelvic pain. She would like to try taking the Bentyl that she has previously been prescribed, for this pain . Aeryn Medici L      Labs Review Labs Reviewed  URINALYSIS, ROUTINE W REFLEX MICROSCOPIC - Abnormal; Notable for the following:    Hgb urine dipstick TRACE (*)    Bilirubin Urine SMALL (*)    Ketones, ur 40 (*)    All other components within normal limits  CBC WITH DIFFERENTIAL - Abnormal; Notable for the following:    RDW 17.2 (*)    All other components within normal limits  COMPREHENSIVE METABOLIC PANEL - Abnormal; Notable for the following:    Potassium 3.4 (*)    All other components within normal limits  URINE MICROSCOPIC-ADD ON - Abnormal; Notable for the following:    Squamous Epithelial / LPF FEW (*)    All other components within normal limits   Imaging Review Dg Abd Acute W/chest  11/09/2013   CLINICAL DATA:  Mid abdominal pain.  EXAM: ACUTE ABDOMEN SERIES (ABDOMEN 2 VIEW & CHEST 1 VIEW)  COMPARISON:  Prior CT from 09/06/2012.  FINDINGS: Cardiac and mediastinal silhouettes are within normal limits.  Lungs are well inflated. No focal infiltrate, pulmonary edema, or pleural effusion is identified. No pneumothorax.  Gas and stool are seen scattered throughout several nondilated loops of bowel. No evidence of obstruction or ileus. No free air. No soft tissue mass or abnormal calcifications.  Degenerative changes noted within the lower lumbar spine. No acute osseous abnormality.  IMPRESSION: Nonobstructive bowel gas pattern with no radiographic evidence of acute intra-abdominal  process.  No acute cardiopulmonary disease.   Electronically Signed   By: Jeannine Boga M.D.   On: 11/09/2013 04:21    EKG Interpretation   None       MDM   1. Abdominal pain      Nonspecific abdominal pain, with negative evaluation in the emergency department. Doubt bowel obstruction, gallbladder disease, colitis, urinary tract infection, or inflammatory bowel disease.    Nursing Notes Reviewed/ Care Coordinated Applicable Imaging Reviewed Interpretation of Laboratory Data incorporated into ED treatment  The patient appears reasonably screened and/or stabilized for discharge and I doubt any other medical condition or other [facility] requiring further screening, evaluation, or  treatment in the ED at this time prior to discharge.  Plan: Home Medications- Bentyl; Home Treatments- rest; return here if the recommended treatment, does not improve the symptoms; Recommended follow up- PCP 1 week, consider GI referral    Richarda Blade, MD 11/09/13 [phone]

## 2013-11-09 NOTE — ED Notes (Signed)
Patient c/o generalized abdominal cramping x 1 month.  Patient states she got insurance 2 days ago and decided to get it checked.  Patient states pain is the same.

## 2013-11-09 NOTE — ED Notes (Signed)
Discharge instructions given and reviewed with patient.  Prescription given for Bentyl; effects and use explained.  Patient verbalized understanding of use of medication and to follow up with Dr. Wolfgang Phoenix for further evaluation.  Patient ambulatory; discharged home in good condition.

## 2013-11-09 NOTE — ED Notes (Signed)
IV d/c'd by Sunday Corn, NT

## 2013-11-09 NOTE — Discharge Instructions (Signed)

## 2013-11-20 ENCOUNTER — Telehealth: Payer: Self-pay | Admitting: Family Medicine

## 2013-11-20 NOTE — Telephone Encounter (Signed)
Office visit scheduled for ER follow up and to discuss referral.

## 2013-11-20 NOTE — Telephone Encounter (Signed)
Pt was in ER for abd pain, ran test and didn't find anything. No pain meds or antibiotics issued  Was told to call here to find out if you want to just send her to a GI doc or to set up an appt to follow up  From ER.   Advised pt should make appt but wanted note sent instead

## 2013-11-22 ENCOUNTER — Ambulatory Visit (INDEPENDENT_AMBULATORY_CARE_PROVIDER_SITE_OTHER): Payer: BC Managed Care – PPO | Admitting: Obstetrics and Gynecology

## 2013-11-22 ENCOUNTER — Encounter: Payer: Self-pay | Admitting: Obstetrics and Gynecology

## 2013-11-22 ENCOUNTER — Other Ambulatory Visit: Payer: Self-pay | Admitting: Obstetrics and Gynecology

## 2013-11-22 VITALS — BP 150/90 | Ht 65.0 in | Wt 257.6 lb

## 2013-11-22 DIAGNOSIS — R9389 Abnormal findings on diagnostic imaging of other specified body structures: Secondary | ICD-10-CM

## 2013-11-22 DIAGNOSIS — Z3202 Encounter for pregnancy test, result negative: Secondary | ICD-10-CM

## 2013-11-22 LAB — POCT URINE PREGNANCY: PREG TEST UR: NEGATIVE

## 2013-11-22 NOTE — Patient Instructions (Signed)
Expect results by phone next wk.

## 2013-11-22 NOTE — Addendum Note (Signed)
Addended by: Linton Rump on: 11/22/2013 01:12 PM   Modules accepted: Orders

## 2013-11-22 NOTE — Progress Notes (Signed)
This chart was transcribed for Dr. Mallory Shirk by Ludger Nutting, ED scribe.     [facility] Visit  Patient name: Summer [name] MRN [id number]  Date of birth: 09/26/1961  CC & HPI:  [name] is a 53 y.o. female presenting today for abdominal pain. She reports being seen by Dr. Wolfgang Phoenix on 10/23/13 for similar symptoms. She had an US performed in October 2014 which had results of thickened uterine wall.  FINDINGS:  Examination is markedly limited secondary to patient's body habitus.  Uterus  Measurements: Poorly visualized. Measures approximately 11.2 x 4.4 x  6.4 cm on transabdominal imaging. No definite mass is identified.  Nabothian cyst.  Endometrium  Thickness: Markedly difficult to visualize secondary to limited  examination however there is a suggestion that the endometrium is  thickened and measures up to 17 mm.       She was referred here to follow up with an endometrial biopsy. She denies being sexually active for the past 6 months.   ROS:  Negative except for abdominal pain.   Pertinent History Reviewed:  Medical Hx:  Past Medical History  Diagnosis Date  . Fibromyalgia   . Arthritis   . Dysrhythmia, cardiac   . Hypertension   . Bell's palsy     Surgical Hx:  Reviewed: Significant for   Past Surgical History  Procedure Laterality Date  . Tonsillectomy      Medications: Reviewed & Updated - see associated section Social History: Reviewed -  reports that she has quit smoking. Her smoking use included Cigarettes. She smoked 0.00 packs per day. She has never used smokeless tobacco.  Objective Findings:  Vitals: BP 150/90  Ht 5\' 5"  (1.651 m)  Wt 257 lb 9.6 oz (116.847 kg)  BMI 42.87 kg/m2  LMP 01/02/2012 Obesity limits exam Physical Examination: General appearance - alert, well appearing, and in no distress and oriented to person, place, and time Pelvic - VULVA: normal appearing vulva with no masses, tenderness or lesions, VAGINA: normal  appearing vagina with normal color and discharge, no lesions, CERVIX: normal appearing cervix without discharge or lesions, UTERUS: uterus is normal size, shape, consistency and nontender, anteverted, sounds to 9 cm, ADNEXA: normal adnexa in size, nontender and no masses   Assessment & Plan:   Will get an endometrial biopsy.   Endometrial Biopsy: Patient given informed consent, signed copy in the chart, time out was performed. Time out taken. . The patient was placed in the lithotomy position and the cervix brought into view with sterile speculum.  Portio of cervix cleansed x 2 with betadine swabs.  A tenaculum was placed in the anterior lip of the cervix. The uterus was sounded for depth of 9 cm,. Milex uterine Explora 3 mm was introduced to into the uterus, suction created,  and an endometrial sample was obtained only a small amount of tissue . All equipment was removed and accounted for.   The patient tolerated the procedure well.    Patient given post procedure instructions.  Followup: by phone IMP: postmenopausal endometrial thickening      R/o endo hyperplasia

## 2013-11-23 ENCOUNTER — Encounter: Payer: Self-pay | Admitting: Family Medicine

## 2013-11-23 ENCOUNTER — Ambulatory Visit (INDEPENDENT_AMBULATORY_CARE_PROVIDER_SITE_OTHER): Payer: BC Managed Care – PPO | Admitting: Family Medicine

## 2013-11-23 VITALS — BP 130/90 | Temp 98.6°F | Ht 65.0 in | Wt 260.5 lb

## 2013-11-23 DIAGNOSIS — E876 Hypokalemia: Secondary | ICD-10-CM

## 2013-11-23 DIAGNOSIS — R109 Unspecified abdominal pain: Secondary | ICD-10-CM

## 2013-11-23 LAB — LIPASE: LIPASE: 21 U/L (ref 0–75)

## 2013-11-23 NOTE — Progress Notes (Signed)
   Subjective:    Patient ID: Summer [name], female    DOB: 12-Sep-1961, 53 y.o.   MRN: [id number]  Abdominal Pain This is a recurrent problem. The current episode started more than 1 month ago. The onset quality is gradual. The problem occurs intermittently. The problem has been unchanged. The pain is located in the generalized abdominal region. The pain is at a severity of 6/10. The pain is moderate. Nothing aggravates the pain. The pain is relieved by nothing. The treatment provided no relief.   Patient was seen at [facility] ER on 11/09/13 for abdominal pain. Had a biopsy done yesterday at Dr. Johnnye Sima office.  PMH benign please see ER notes please see other notes  Review of Systems  Gastrointestinal: Positive for abdominal pain.   denies cough wheeze high fever chills     Objective:   Physical Exam  Lungs are clear heart is regular pulse normal abdomen soft obese subjective discomfort in the midabdomen no guarding or rebound      Assessment & Plan:  Chronic abdominal pain for several months patient had CT scan approximately 15 months ago which was normal I believe the patient will benefit from a gastroenterology consult along with EGD and colonoscopy. I believe this patient more than likely has irritable bowel syndrome. She is 53 years old and is due colonoscopy

## 2013-11-26 ENCOUNTER — Encounter: Payer: Self-pay | Admitting: Family Medicine

## 2013-11-26 LAB — TISSUE TRANSGLUTAMINASE, IGA: Tissue Transglutaminase Ab, IgA: 5 U/mL (ref <20)

## 2013-11-28 ENCOUNTER — Telehealth: Payer: Self-pay | Admitting: Obstetrics and Gynecology

## 2013-11-28 NOTE — Telephone Encounter (Signed)
Pathology of thickened endometrium: Benign Atrophic endometruim., Pt informed. She's being worked up for abd pain, to see GI soon and see [place] in a month. Will schedule for a SONOHYSTEROGRAM, to see if there's an endometrial polyp, which could cause cramping if the uterus is trying to expel the polyp,(as if it was a clot)

## 2013-11-30 ENCOUNTER — Encounter (INDEPENDENT_AMBULATORY_CARE_PROVIDER_SITE_OTHER): Payer: Self-pay | Admitting: *Deleted

## 2013-12-17 ENCOUNTER — Telehealth: Payer: Self-pay | Admitting: Family Medicine

## 2013-12-17 MED ORDER — AMOXICILLIN 500 MG PO CAPS: 500.0000 mg | ORAL_CAPSULE | Freq: Three times a day (TID) | ORAL | Status: DC

## 2013-12-17 NOTE — Telephone Encounter (Signed)
Patient is having fever, body aches, sore throat, coughing up green mucous. Her son [place] was just diagnosed with the flu last week, and she says she is pretty positive it's the flu. She would like to know if we can call something in.     [place]

## 2013-12-17 NOTE — Telephone Encounter (Signed)
Patient said she had flu last week and has got over that but now has a sinus infxn and needs an antibiotic- advised we normally don't call in antibiotics but she wants to see if you could do it this time because of weather

## 2013-12-17 NOTE — Telephone Encounter (Signed)
Amoxil 500 one tid 10 days, f/u if ongoing issues

## 2013-12-17 NOTE — Telephone Encounter (Signed)
NTC- (we can call in Tamiflu but she needs phone triage regarding Sx, length of Sx and to ensure no red flags like severe shortness of breath etc

## 2013-12-17 NOTE — Telephone Encounter (Signed)
Rx sent electronically to [facility].  Patient notified. 

## 2013-12-19 ENCOUNTER — Ambulatory Visit (INDEPENDENT_AMBULATORY_CARE_PROVIDER_SITE_OTHER): Payer: BC Managed Care – PPO | Admitting: Internal Medicine

## 2013-12-24 ENCOUNTER — Ambulatory Visit: Payer: BC Managed Care – PPO | Admitting: Obstetrics and Gynecology

## 2013-12-24 ENCOUNTER — Other Ambulatory Visit: Payer: BC Managed Care – PPO

## 2013-12-24 ENCOUNTER — Other Ambulatory Visit: Payer: Self-pay | Admitting: Obstetrics and Gynecology

## 2013-12-24 DIAGNOSIS — N8500 Endometrial hyperplasia, unspecified: Secondary | ICD-10-CM

## 2013-12-26 ENCOUNTER — Ambulatory Visit (INDEPENDENT_AMBULATORY_CARE_PROVIDER_SITE_OTHER): Payer: BC Managed Care – PPO | Admitting: Internal Medicine

## 2013-12-26 ENCOUNTER — Encounter (INDEPENDENT_AMBULATORY_CARE_PROVIDER_SITE_OTHER): Payer: Self-pay | Admitting: Internal Medicine

## 2013-12-26 ENCOUNTER — Other Ambulatory Visit (INDEPENDENT_AMBULATORY_CARE_PROVIDER_SITE_OTHER): Payer: Self-pay | Admitting: *Deleted

## 2013-12-26 ENCOUNTER — Telehealth (INDEPENDENT_AMBULATORY_CARE_PROVIDER_SITE_OTHER): Payer: Self-pay | Admitting: *Deleted

## 2013-12-26 VITALS — BP 130/82 | HR 76 | Temp 98.0°F | Ht 65.0 in | Wt 262.2 lb

## 2013-12-26 DIAGNOSIS — Z1211 Encounter for screening for malignant neoplasm of colon: Secondary | ICD-10-CM

## 2013-12-26 DIAGNOSIS — R109 Unspecified abdominal pain: Secondary | ICD-10-CM

## 2013-12-26 MED ORDER — PEG-KCL-NACL-NASULF-NA ASC-C 100 G PO SOLR: 1.0000 | Freq: Once | ORAL | Status: DC

## 2013-12-26 NOTE — Progress Notes (Signed)
Subjective:     Patient ID: Summer [name], female   DOB: 03-07-1961, 53 y.o.   MRN: [id number]  HPI Referred to our office Dr. Wolfgang Phoenix for lower abdominal pain. She has had pain for a couple. The pain comes and goes. She has had this pain for at least 6 months. Rates pain 4/10 today.  If she bends over, her abdomen will hurt. If her cat walks across her abdomen, it hurts. Greasy foods and nuts increases the pain. Pain worsens after she has been up and moving for a while  Appetite is good. No weight loss.  She usually has a BM daily sometimes twice a day. Stools are usually brown, sometimes they are yellow. No melena or bright red rectal bleeding. She has acid reflux daily. She was taking Omeprazole which did not help. She saw an ENT and underwent what sounds like a laryngoscopy which was essentially normal.  She did not follow up with Teoh.   CBC    Component Value Date/Time   WBC 10.3 11/09/2013 0344   RBC 5.01 11/09/2013 0344   HGB 14.4 11/09/2013 0344   HCT 43.0 11/09/2013 0344   PLT 298 11/09/2013 0344   MCV 85.8 11/09/2013 0344   MCH 28.7 11/09/2013 0344   MCHC 33.5 11/09/2013 0344   RDW 17.2* 11/09/2013 0344   LYMPHSABS 4.0 11/09/2013 0344   MONOABS 0.9 11/09/2013 0344   EOSABS 0.2 11/09/2013 0344   BASOSABS 0.0 11/09/2013 0344   CMP     Component Value Date/Time   NA 142 11/09/2013 0344   K 3.4* 11/09/2013 0344   CL 103 11/09/2013 0344   CO2 23 11/09/2013 0344   GLUCOSE 99 11/09/2013 0344   BUN 15 11/09/2013 0344   CREATININE 0.68 11/09/2013 0344   CALCIUM 9.6 11/09/2013 0344   PROT 7.8 11/09/2013 0344   ALBUMIN 4.2 11/09/2013 0344   AST 19 11/09/2013 0344   ALT 15 11/09/2013 0344   ALKPHOS 94 11/09/2013 0344   BILITOT 0.5 11/09/2013 0344   GFRNONAA >90 11/09/2013 0344   GFRAA >90 11/09/2013 0344       11/09/2013 Abdominal films: IMPRESSION:  Nonobstructive bowel gas pattern with no radiographic evidence of  acute intra-abdominal process.  No acute cardiopulmonary disease.   08/2013 US pelvis:  IMPRESSION:   1. Markedly limited examination with poor visualization of the  pelvic structures due to body habitus. Neither the right or left  ovary were confidently identified on either transabdominal or  endovaginal imaging.  2. The uterus is difficult to visualize especially on endovaginal  imaging. There is suggestion on transabdominal imaging that the  endometrium it is thickened measuring up to 17 mm. As the uterus and  endometrium are difficult to visualize, consider short-term followup  ultrasound or pelvic MRI in 6 weeks. If the endometrium remains  thickened, then this would be considered abnormal for an  asymptomatic post-menopausal female. Endometrial sampling should be  considered to exclude carcinoma.  These results will be called to the ordering clinician or  representative by the Radiologist Assistant, and communication  documented in the PACS Dashboard.  11/2013 Biopsy endometrial was benign by Dr. Glo Herring.   Review of Systems Past Medical History  Diagnosis Date  . Fibromyalgia   . Arthritis   . Dysrhythmia, cardiac   . Hypertension   . Bell's palsy     Past Surgical History  Procedure Laterality Date  . Tonsillectomy      Allergies  Allergen Reactions  . Avelox [  Moxifloxacin Hcl In Nacl]   . Bee Venom Hives  . Doxycycline   . Shellfish Allergy Hives    Current Outpatient Prescriptions on File Prior to Visit  Medication Sig Dispense Refill  . amoxicillin (AMOXIL) 500 MG capsule Take 1 capsule (500 mg total) by mouth 3 (three) times daily.  30 capsule  0  . Misc Natural Products (MULTI-HERB PO) Take by mouth.      Marland Kitchen omeprazole (PRILOSEC) 20 MG capsule Take 20 mg by mouth daily.       No current facility-administered medications on file prior to visit.        Objective:   Physical Exam  Filed Vitals:   12/26/13 1114  BP: 130/82  Pulse: 76  Temp: 98 F (36.7 C)  Height: 5\' 5"  (1.651 m)  Weight: 262 lb 3.2 oz (118.933 kg)    Alert and oriented. Skin  warm and dry. Oral mucosa is moist.   . Sclera anicteric, conjunctivae is pink. Thyroid not enlarged. No cervical lymphadenopathy. Lungs clear. Heart regular rate and rhythm.  Abdomen is soft. Bowel sounds are positive. No hepatomegaly. No abdominal masses felt. Tenderness lower abdomen.  No edema to lower extremities.       Assessment:     Abdominal pain ? Etiology. She has never undergone a screening colonoscopy in the past. Possible IBS She does not have any alarm symptoms. GI source of her pain needs to be ruled out.     Plan:     Screening colonoscopy

## 2013-12-26 NOTE — Patient Instructions (Signed)
Colonoscopy with DR. Rehman. The risks and benefits such as perforation, bleeding, and infection were reviewed with the patient and is agreeable.

## 2013-12-26 NOTE — Telephone Encounter (Signed)
Patient needs movi prep 

## 2014-01-07 ENCOUNTER — Encounter (HOSPITAL_COMMUNITY): Payer: Self-pay | Admitting: Pharmacy Technician

## 2014-01-23 ENCOUNTER — Encounter (HOSPITAL_COMMUNITY)
Admission: RE | Disposition: A | Discharge status: Home / Self Care | Payer: Self-pay | Source: Ambulatory Visit | Attending: Internal Medicine

## 2014-01-23 ENCOUNTER — Encounter (HOSPITAL_COMMUNITY): Payer: Self-pay | Admitting: *Deleted

## 2014-01-23 ENCOUNTER — Ambulatory Visit (HOSPITAL_COMMUNITY)
Admission: RE | Admit: 2014-01-23 | Discharge: 2014-01-23 | Disposition: A | Discharge status: Home / Self Care | Payer: BC Managed Care – PPO | Source: Ambulatory Visit | Attending: Internal Medicine | Admitting: Internal Medicine

## 2014-01-23 DIAGNOSIS — D126 Benign neoplasm of colon, unspecified: Secondary | ICD-10-CM | POA: Insufficient documentation

## 2014-01-23 DIAGNOSIS — R109 Unspecified abdominal pain: Secondary | ICD-10-CM

## 2014-01-23 DIAGNOSIS — I10 Essential (primary) hypertension: Secondary | ICD-10-CM | POA: Insufficient documentation

## 2014-01-23 DIAGNOSIS — Z1211 Encounter for screening for malignant neoplasm of colon: Secondary | ICD-10-CM | POA: Insufficient documentation

## 2014-01-23 DIAGNOSIS — K644 Residual hemorrhoidal skin tags: Secondary | ICD-10-CM | POA: Insufficient documentation

## 2014-01-23 DIAGNOSIS — Z87891 Personal history of nicotine dependence: Secondary | ICD-10-CM | POA: Insufficient documentation

## 2014-01-23 HISTORY — PX: COLONOSCOPY: SHX5424

## 2014-01-23 SURGERY — COLONOSCOPY
Anesthesia: Moderate Sedation

## 2014-01-23 MED ORDER — MICONAZOLE NITRATE 2 % EX CREA: 1.0000 "application " | TOPICAL_CREAM | Freq: Two times a day (BID) | CUTANEOUS | Status: DC

## 2014-01-23 MED ORDER — DICYCLOMINE HCL 10 MG PO CAPS: 10.0000 mg | ORAL_CAPSULE | Freq: Three times a day (TID) | ORAL | Status: DC | PRN

## 2014-01-23 MED ORDER — MIDAZOLAM HCL 5 MG/5ML IJ SOLN
INTRAMUSCULAR | Status: AC
  Filled 2014-01-23: qty 10

## 2014-01-23 MED ORDER — MEPERIDINE HCL 50 MG/ML IJ SOLN
INTRAMUSCULAR | Status: DC | PRN
  Administered 2014-01-23 (×2): 25 mg via INTRAVENOUS

## 2014-01-23 MED ORDER — MIDAZOLAM HCL 5 MG/5ML IJ SOLN
INTRAMUSCULAR | Status: AC
  Filled 2014-01-23: qty 5

## 2014-01-23 MED ORDER — SODIUM CHLORIDE 0.9 % IV SOLN
INTRAVENOUS | Status: DC
Start: 2014-01-23 — End: 2014-01-23
  Administered 2014-01-23: 14:00:00 via INTRAVENOUS

## 2014-01-23 MED ORDER — MEPERIDINE HCL 50 MG/ML IJ SOLN
INTRAMUSCULAR | Status: AC
  Filled 2014-01-23: qty 1

## 2014-01-23 MED ORDER — MIDAZOLAM HCL 5 MG/5ML IJ SOLN
INTRAMUSCULAR | Status: DC | PRN
  Administered 2014-01-23 (×3): 2 mg via INTRAVENOUS
  Administered 2014-01-23 (×2): 3 mg via INTRAVENOUS

## 2014-01-23 MED ORDER — STERILE WATER FOR IRRIGATION IR SOLN
Status: DC | PRN
  Administered 2014-01-23: 14:00:00

## 2014-01-23 NOTE — Discharge Instructions (Signed)
Resume usual medications and diet. Dicyclomine 10 mg by mouth 3 times a day as needed for abdominal pain. Miconazole 2% cream applied to area with rash across lower abdomen twice daily until rash resolved. No driving for 24 hours. Physician will contact you with biopsy results.  Colonoscopy, Care After Refer to this sheet in the next few weeks. These instructions provide you with information on caring for yourself after your procedure. Your health care provider may also give you more specific instructions. Your treatment has been planned according to current medical practices, but problems sometimes occur. Call your health care provider if you have any problems or questions after your procedure. WHAT TO EXPECT AFTER THE PROCEDURE  After your procedure, it is typical to have the following:  A small amount of blood in your stool.  Moderate amounts of gas and mild abdominal cramping or bloating. HOME CARE INSTRUCTIONS  Do not drive, operate machinery, or sign important documents for 24 hours.  You may shower and resume your regular physical activities, but move at a slower pace for the first 24 hours.  Take frequent rest periods for the first 24 hours.  Walk around or put a warm pack on your abdomen to help reduce abdominal cramping and bloating.  Drink enough fluids to keep your urine clear or pale yellow.  You may resume your normal diet as instructed by your health care provider. Avoid heavy or fried foods that are hard to digest.  Avoid drinking alcohol for 24 hours or as instructed by your health care provider.  Only take over-the-counter or prescription medicines as directed by your health care provider.  If a tissue sample (biopsy) was taken during your procedure:  Do not take aspirin or blood thinners for 7 days, or as instructed by your health care provider.  Do not drink alcohol for 7 days, or as instructed by your health care provider.  Eat soft foods for the first 24  hours. SEEK MEDICAL CARE IF: You have persistent spotting of blood in your stool 2 3 days after the procedure. SEEK IMMEDIATE MEDICAL CARE IF:  You have more than a small spotting of blood in your stool.  You pass large blood clots in your stool.  Your abdomen is swollen (distended).  You have nausea or vomiting.  You have a fever.  You have increasing abdominal pain that is not relieved with medicine.

## 2014-01-23 NOTE — Op Note (Signed)
COLONOSCOPY PROCEDURE REPORT  PATIENT:  Summer [name]  MR#:  [id number] Birthdate:  [birth year], 53 y.o., female Endoscopist:  Dr. Rogene Houston, MD Referred By:  Dr. Scherry Ran Procedure Date: 01/23/2014  Procedure:   Colonoscopy  Indications:  Patient is 53 year old Caucasian female who is undergoing colonoscopy primarily for screening purposes. She has been experiencing lower abdominal pain which is predominantly musculoskeletal possibly related to her fibromyalgia. Family history is negative for CRC.  Informed Consent:  The procedure and risks were reviewed with the patient and informed consent was obtained.  Medications:  Demerol 50 mg IV Versed 12 mg IV  Description of procedure:  After a digital rectal exam was performed, that colonoscope was advanced from the anus through the rectum and colon to the area of the cecum, ileocecal valve and appendiceal orifice. The cecum was deeply intubated. These structures were well-seen and photographed for the record. From the level of the cecum and ileocecal valve, the scope was slowly and cautiously withdrawn. The mucosal surfaces were carefully surveyed utilizing scope tip to flexion to facilitate fold flattening as needed. The scope was pulled down into the rectum where a thorough exam including retroflexion was performed.  Findings:   Prep excellent. Small polyp ablated via cold biopsy from ascending colon. Another small polyp ablated via cold biopsy from descending colon. Mucosa rest of the colon and rectum was normal. Small hemorrhoids below the dentate line.   Therapeutic/Diagnostic Maneuvers Performed:  See above  Complications:  None  Cecal Withdrawal Time:  13 minutes  Impression:  Examination performed to cecum. Two small polyps ablated via cold biopsy and submitted together. One was located at ascending colon and the second one at descending colon. External hemorrhoids.  Recommendations:  Standard instructions  given. Dicyclomine 10 mg by mouth three times a day when necessary. Miconazole 2% cream to be applied to the skin with rash twice daily until rash resolved I will contact patient with biopsy results and further recommendations.  Aleece Loyd U  01/23/2014 2:59 PM  CC: Dr. Sallee Lange, MD & Dr. Rayne Du ref. provider found

## 2014-01-23 NOTE — H&P (Signed)
Summer [name] is an 53 y.o. female.   Chief Complaint: Patient is here for colonoscopy. HPI: Patient is 53 year old Caucasian female was colonoscopy recommended for screening purposes. She was recently evaluated for lower abdominal pain. Most of the pain is felt to be wall pain or musculoskeletal pain possibly related to fibromyalgia. At times he does have pain treated with meals. She may also have an element of IBS. She denies change in bowel habits rectal bleeding. Family history is negative for CRC.  Past Medical History  Diagnosis Date  . Fibromyalgia   . Arthritis   . Dysrhythmia, cardiac   . Hypertension   . Bell's palsy     Past Surgical History  Procedure Laterality Date  . Tonsillectomy      Family History  Problem Relation Age of Onset  . Adopted: Yes  . Cancer Mother     lung   Social History:  reports that she has quit smoking. Her smoking use included Cigarettes. She smoked 0.00 packs per day. She has never used smokeless tobacco. She reports that she does not drink alcohol or use illicit drugs.  Allergies:  Allergies  Allergen Reactions  . Avelox [Moxifloxacin Hcl In Nacl]   . Bee Venom Hives  . Doxycycline   . Shellfish Allergy Hives    Medications Prior to Admission  Medication Sig Dispense Refill  . lactobacillus acidophilus (BACID) TABS tablet Take by mouth 3 (three) times daily.      . Misc Natural Products (MULTI-HERB PO) Take by mouth.      . peg 3350 powder (MOVIPREP) 100 G SOLR Take 1 kit (200 g total) by mouth once.  1 kit  0    No results found for this or any previous visit (from the past 48 hour(s)). No results found.  ROS  Blood pressure 139/80, pulse 105, temperature 98 F (36.7 C), temperature source Oral, resp. rate 18, last menstrual period 01/23/2014, SpO2 100.00%. Physical Exam  Constitutional: She appears well-developed and well-nourished.  HENT:  Mouth/Throat: Oropharynx is clear and moist.  Eyes: Conjunctivae are normal.  No scleral icterus.  Neck: No thyromegaly present.  Cardiovascular: Normal rate, regular rhythm and normal heart sounds.   No murmur heard. Respiratory: Effort normal and breath sounds normal.  GI: Soft. She exhibits no distension. Tenderness: mild tenderness in hypogastric region.  Rash along her abdominal crease  Musculoskeletal: She exhibits no edema.  Lymphadenopathy:    She has no cervical adenopathy.  Neurological: She is alert.  Skin: Skin is warm and dry.     Assessment/Plan Average risk screening colonoscopy. Lower abdominal pain felt to be primarily musculoskeletal possibly related to fibromyalgia.  Summer [name] 01/23/2014, 2:09 PM

## 2014-01-29 ENCOUNTER — Encounter (HOSPITAL_COMMUNITY): Payer: Self-pay | Admitting: Internal Medicine

## 2014-01-30 ENCOUNTER — Encounter: Payer: Self-pay | Admitting: Family Medicine

## 2014-01-30 ENCOUNTER — Ambulatory Visit (INDEPENDENT_AMBULATORY_CARE_PROVIDER_SITE_OTHER): Payer: BC Managed Care – PPO | Admitting: Family Medicine

## 2014-01-30 VITALS — BP 132/90 | Temp 98.2°F | Ht 65.0 in | Wt 266.0 lb

## 2014-01-30 DIAGNOSIS — M545 Low back pain, unspecified: Secondary | ICD-10-CM

## 2014-01-30 DIAGNOSIS — R300 Dysuria: Secondary | ICD-10-CM

## 2014-01-30 LAB — POCT URINALYSIS DIPSTICK
Spec Grav, UA: <=1.005
pH, UA: 6

## 2014-01-30 MED ORDER — CHLORZOXAZONE 500 MG PO TABS: 500.0000 mg | ORAL_TABLET | Freq: Three times a day (TID) | ORAL | Status: DC | PRN

## 2014-01-30 MED ORDER — ETODOLAC 400 MG PO TABS: 400.0000 mg | ORAL_TABLET | Freq: Two times a day (BID) | ORAL | Status: DC | PRN

## 2014-01-30 NOTE — Progress Notes (Signed)
Apt has been scheduled for 02/25/14 with Terri Setzer, NP.  

## 2014-01-30 NOTE — Progress Notes (Signed)
   Subjective:    Patient ID: Summer [name], female    DOB: 1961-01-02, 53 y.o.   MRN: [id number]  Back Pain This is a new problem. The current episode started 1 to 4 weeks ago. The quality of the pain is described as burning. The symptoms are aggravated by bending. (Low abd pain) She has tried NSAIDs for the symptoms.   Patient notes low back pain primarily lumbar region. Worse with motions. Recalls no acute injury.  Currently being worked up for chronic abdominal pain. Just saw Dr. [place]. Placed on anti-spasmodic agent.  Perhaps slight dysuria   Review of Systems  Musculoskeletal: Positive for back pain.   no fever no chills no vomiting ROS otherwise negative     Objective:   Physical Exam  Alert no acute distress. HEENT normal. Lungs clear. Heart regular in rhythm. Low back tender to palpation diffusely in the lumbar region. Negative straight leg raise.  Urinalysis normal      Assessment & Plan:  Impression #1 lumbar strain. #2 chronic abdominal pain briefly discussed plan anti-inflammatory medicine. Into spasmus. Local measures discussed. Symptomatic care discussed. WSL

## 2014-02-25 ENCOUNTER — Ambulatory Visit (INDEPENDENT_AMBULATORY_CARE_PROVIDER_SITE_OTHER): Payer: BC Managed Care – PPO | Admitting: Internal Medicine

## 2014-03-11 ENCOUNTER — Encounter (INDEPENDENT_AMBULATORY_CARE_PROVIDER_SITE_OTHER): Payer: Self-pay | Admitting: Internal Medicine

## 2014-03-11 ENCOUNTER — Ambulatory Visit (INDEPENDENT_AMBULATORY_CARE_PROVIDER_SITE_OTHER): Payer: BC Managed Care – PPO | Admitting: Internal Medicine

## 2014-03-11 VITALS — BP 120/84 | HR 76 | Temp 98.3°F | Ht 65.0 in | Wt 264.5 lb

## 2014-03-11 DIAGNOSIS — R1013 Epigastric pain: Secondary | ICD-10-CM

## 2014-03-11 DIAGNOSIS — G8929 Other chronic pain: Secondary | ICD-10-CM

## 2014-03-11 NOTE — Progress Notes (Signed)
Subjective:     Patient ID: Summer [name], female   DOB: Jan 08, 1961, 53 y.o.   MRN: [id number]  HPI Here today for f/u after recently undergoing a colonoscopy in March for primarily screening purposes. Given an Rx for Dicyclomine for lower abdominal cramps.  Appetite is okay. There has been no weight loss. She has actually gained 2.5 pounds since February. No dysphagia. Occasionally has acid reflux. Sometimes she cannot eat as much as she use today. She usually has a BM x 1-2 a day. No melena or bright red rectal bleeding. She had the Dicyclomine filled. It has helped her upper epigastric tightness. She says she still has some tenderness to her lower abdomen if she leans up against a wall.   01/23/2014 Colonoscopy: Dr. Laural Golden: primarily screening :  Impression:  Examination performed to cecum.  Two small polyps ablated via cold biopsy and submitted together. One was located at ascending colon and the second one at descending colon.  External hemorrhoids. Biopsy:  Patient had 2 small polyps removed.  Both polyps are tubular adenomas. Next colonoscopy in 5 years.    Review of Systems see hpi  Past Medical History  Diagnosis Date  . Fibromyalgia   . Arthritis   . Dysrhythmia, cardiac   . Hypertension   . Bell's palsy     Past Surgical History  Procedure Laterality Date  . Tonsillectomy    . Colonoscopy N/A 01/23/2014    Procedure: COLONOSCOPY;  Surgeon: Rogene Houston, MD;  Location: [facility];  Service: Endoscopy;  Laterality: N/A;  200    Allergies  Allergen Reactions  . Avelox [Moxifloxacin Hcl In Nacl]   . Bee Venom Hives  . Doxycycline   . Shellfish Allergy Hives    Current Outpatient Prescriptions on File Prior to Visit  Medication Sig Dispense Refill  . dicyclomine (BENTYL) 10 MG capsule Take 1 capsule (10 mg total) by mouth 3 (three) times daily as needed for spasms.  60 capsule  2  . lactobacillus acidophilus (BACID) TABS tablet Take by mouth 3 (three)  times daily.      . miconazole (MICATIN) 2 % cream Apply 1 application topically 2 (two) times daily.  45 g  1  . Misc Natural Products (MULTI-HERB PO) Take by mouth.      . Multiple Vitamin (MULTIVITAMIN) tablet Take 1 tablet by mouth daily.       No current facility-administered medications on file prior to visit.        Objective:   Physical Exam  Filed Vitals:   03/11/14 0908  BP: 120/84  Pulse: 76  Temp: 98.3 F (36.8 C)  Height: 5\' 5"  (1.651 m)  Weight: 264 lb 8 oz (119.976 kg)   Alert and oriented. Skin warm and dry. Oral mucosa is moist.   . Sclera anicteric, conjunctivae is pink. Thyroid not enlarged. No cervical lymphadenopathy. Lungs clear. Heart regular rate and rhythm.  Abdomen is soft. Bowel sounds are positive. No hepatomegaly. No abdominal masses felt. No abdominal  tenderness.  No edema to lower extremities.       Assessment:    She c/o some epigastric tenderness today. The dicyclomine has helped. No side effects from the Dicyclomine except it makes her sleepy.  No alarm symptoms. She has gained 2 pounds since February.     Plan:     OV in 2 months Needs to lose 5 pounds before next OV if possible. Start the Omeprazole back that Dr. Wolfgang Phoenix prescribed.

## 2014-03-11 NOTE — Patient Instructions (Signed)
OV in 2 months. Nees to try to loose 5 pounds.

## 2014-03-21 ENCOUNTER — Encounter: Payer: Self-pay | Admitting: Family Medicine

## 2014-03-21 ENCOUNTER — Ambulatory Visit (INDEPENDENT_AMBULATORY_CARE_PROVIDER_SITE_OTHER): Payer: BC Managed Care – PPO | Admitting: Family Medicine

## 2014-03-21 VITALS — BP 140/80 | Temp 98.0°F | Ht 65.0 in | Wt 268.8 lb

## 2014-03-21 DIAGNOSIS — T148 Other injury of unspecified body region: Secondary | ICD-10-CM

## 2014-03-21 DIAGNOSIS — M2550 Pain in unspecified joint: Secondary | ICD-10-CM

## 2014-03-21 DIAGNOSIS — W57XXXA Bitten or stung by nonvenomous insect and other nonvenomous arthropods, initial encounter: Secondary | ICD-10-CM

## 2014-03-21 MED ORDER — AMOXICILLIN 500 MG PO TABS: 500.0000 mg | ORAL_TABLET | Freq: Three times a day (TID) | ORAL | Status: DC

## 2014-03-21 NOTE — Progress Notes (Signed)
   Subjective:    Patient ID: Summer [name], female    DOB: 05/15/1961, 53 y.o.   MRN: [id number]  HPI Started 7 to 10 days ago Past 2 to 3 days with joint pains and numbness  She relates over the past few days at times he's felt a little bit of tingling in the left hand she also relates some joint discomfort she denies muscle aches denies high fever chills she does state she's had several tick bites she is worried about tick related illness  Review of Systems     Objective:   Physical Exam Insect bite x 2 (ticks) Near belly button & the Back of the knee. Inflamed hurting itching. Expeirence arm numbness, joint pain, muscle weakness, nausea       Assessment & Plan:  Multiple tick bites along with not feeling well. Patient is allergic to doxycycline we'll go ahead and treat with amoxicillin patient cannot afford extensive testing she does not appear toxic I told her if she is not improving in the next 4-6 days to call back and we will go ahead and order blood tests. Warning signs discussed if high fevers severe muscle pains headaches or worse followup immediately

## 2014-04-02 ENCOUNTER — Telehealth: Payer: Self-pay

## 2014-04-02 DIAGNOSIS — R5383 Other fatigue: Secondary | ICD-10-CM

## 2014-04-02 DIAGNOSIS — M2550 Pain in unspecified joint: Secondary | ICD-10-CM

## 2014-04-02 NOTE — Telephone Encounter (Signed)
Patient called and stated that she has recently been through 2 rounds of antibiotics for tick related illness and she feels no better. She is having tingling and pain in her arms, legs, hands and feet. Fatigue also noted. This has been present for about 2-3 weeks. Patient wants her sugar tested also. Please order bloodwork.

## 2014-04-02 NOTE — Telephone Encounter (Signed)
This is unlikely to be tick related illness based on the symptoms. Check sedimentation rate, CBC, liver, metabolic 7. Should also check her cholesterol.

## 2014-04-02 NOTE — Telephone Encounter (Signed)
Notified patient this is unlikely to be tick related illness based on the symptoms and that bloodwork has been ordered and to fast for this bloodwork. Patient verbalized understanding.

## 2014-04-03 ENCOUNTER — Other Ambulatory Visit: Payer: Self-pay

## 2014-04-03 DIAGNOSIS — R5381 Other malaise: Secondary | ICD-10-CM

## 2014-04-03 DIAGNOSIS — M2550 Pain in unspecified joint: Secondary | ICD-10-CM

## 2014-04-03 DIAGNOSIS — Z79899 Other long term (current) drug therapy: Secondary | ICD-10-CM

## 2014-04-03 DIAGNOSIS — R5383 Other fatigue: Secondary | ICD-10-CM

## 2014-04-03 LAB — LIPID PANEL
CHOL/HDL RATIO: 3.8 ratio
Cholesterol: 163 mg/dL (ref 0–200)
HDL: 43 mg/dL (ref >39)
LDL CALC: 95 mg/dL (ref 0–99)
Triglycerides: 124 mg/dL (ref <150)
VLDL: 25 mg/dL (ref 0–40)

## 2014-04-03 LAB — CBC WITH DIFFERENTIAL/PLATELET
BASOS ABS: 0 10*3/uL (ref 0.0–0.1)
BASOS PCT: 0 % (ref 0–1)
Eosinophils Absolute: 0.3 10*3/uL (ref 0.0–0.7)
Eosinophils Relative: 4 % (ref 0–5)
HCT: 41.8 % (ref 36.0–46.0)
Hemoglobin: 14.2 g/dL (ref 12.0–15.0)
Lymphocytes Relative: 43 % (ref 12–46)
Lymphs Abs: 2.9 10*3/uL (ref 0.7–4.0)
MCH: 29.1 pg (ref 26.0–34.0)
MCHC: 34 g/dL (ref 30.0–36.0)
MCV: 85.7 fL (ref 78.0–100.0)
Monocytes Absolute: 0.5 10*3/uL (ref 0.1–1.0)
Monocytes Relative: 8 % (ref 3–12)
NEUTROS ABS: 3 10*3/uL (ref 1.7–7.7)
Neutrophils Relative %: 45 % (ref 43–77)
PLATELETS: 283 10*3/uL (ref 150–400)
RBC: 4.88 MIL/uL (ref 3.87–5.11)
RDW: 16.5 % — AB (ref 11.5–15.5)
WBC: 6.7 10*3/uL (ref 4.0–10.5)

## 2014-04-03 LAB — BASIC METABOLIC PANEL
BUN: 17 mg/dL (ref 6–23)
CO2: 29 mEq/L (ref 19–32)
Calcium: 9.3 mg/dL (ref 8.4–10.5)
Chloride: 105 mEq/L (ref 96–112)
Creat: 0.68 mg/dL (ref 0.50–1.10)
Glucose, Bld: 108 mg/dL — ABNORMAL HIGH (ref 70–99)
POTASSIUM: 4.3 meq/L (ref 3.5–5.3)
SODIUM: 140 meq/L (ref 135–145)

## 2014-04-03 LAB — HEPATIC FUNCTION PANEL
ALBUMIN: 4 g/dL (ref 3.5–5.2)
ALK PHOS: 72 U/L (ref 39–117)
ALT: 15 U/L (ref 0–35)
AST: 19 U/L (ref 0–37)
Bilirubin, Direct: 0.1 mg/dL (ref 0.0–0.3)
Indirect Bilirubin: 0.6 mg/dL (ref 0.2–1.2)
Total Bilirubin: 0.7 mg/dL (ref 0.2–1.2)
Total Protein: 6.6 g/dL (ref 6.0–8.3)

## 2014-04-03 LAB — SEDIMENTATION RATE: SED RATE: 4 mm/h (ref 0–22)

## 2014-04-05 ENCOUNTER — Other Ambulatory Visit: Payer: Self-pay

## 2014-04-05 DIAGNOSIS — G8929 Other chronic pain: Secondary | ICD-10-CM

## 2014-04-08 ENCOUNTER — Encounter (HOSPITAL_COMMUNITY): Payer: Self-pay | Admitting: Emergency Medicine

## 2014-04-08 ENCOUNTER — Emergency Department (HOSPITAL_COMMUNITY)
Admission: EM | Admit: 2014-04-08 | Discharge: 2014-04-08 | Disposition: A | Discharge status: Home / Self Care | Payer: BC Managed Care – PPO | Attending: Emergency Medicine | Admitting: Emergency Medicine

## 2014-04-08 DIAGNOSIS — Z87891 Personal history of nicotine dependence: Secondary | ICD-10-CM | POA: Insufficient documentation

## 2014-04-08 DIAGNOSIS — Z79899 Other long term (current) drug therapy: Secondary | ICD-10-CM | POA: Insufficient documentation

## 2014-04-08 DIAGNOSIS — M791 Myalgia, unspecified site: Secondary | ICD-10-CM

## 2014-04-08 DIAGNOSIS — Z8669 Personal history of other diseases of the nervous system and sense organs: Secondary | ICD-10-CM | POA: Insufficient documentation

## 2014-04-08 DIAGNOSIS — Z8739 Personal history of other diseases of the musculoskeletal system and connective tissue: Secondary | ICD-10-CM | POA: Insufficient documentation

## 2014-04-08 DIAGNOSIS — Z792 Long term (current) use of antibiotics: Secondary | ICD-10-CM | POA: Insufficient documentation

## 2014-04-08 DIAGNOSIS — I10 Essential (primary) hypertension: Secondary | ICD-10-CM | POA: Insufficient documentation

## 2014-04-08 DIAGNOSIS — J029 Acute pharyngitis, unspecified: Secondary | ICD-10-CM | POA: Insufficient documentation

## 2014-04-08 LAB — RAPID STREP SCREEN (MED CTR MEBANE ONLY): STREPTOCOCCUS, GROUP A SCREEN (DIRECT): NEGATIVE

## 2014-04-08 NOTE — ED Notes (Signed)
Family at bedside. 

## 2014-04-08 NOTE — ED Notes (Addendum)
Pt was bite by ticks 5 weeks ago. Pt now c/o tingling and burning in arms, feet, legs, and neck. Also c/o of intermittent headache none at the moment.. Pt also states she amy have strep.

## 2014-04-08 NOTE — ED Provider Notes (Signed)
CSN: [id number]     Arrival date & time 04/08/14  1708 History   First MD Initiated Contact with Patient 04/08/14 1801     This chart was scribed for non-physician practitioner, Alecia Lemming PA-C working with Ephraim Hamburger, MD by Forrestine Him, ED Scribe. This patient was seen in room WTR6/WTR6 and the patient's care was started at 6:06 PM.   Chief Complaint  Patient presents with  . Insect Bite    tick   The history is provided by the patient. No language interpreter was used.    HPI Comments: Summer [name] is a 53 y.o. Female with a PMHx of fibromyalgia, arthritis, Bell's Palsy, and HTN who presents to the Emergency Department complaining of an insect bite x 5-6 weeks with worsening symptoms. Pt states she was bitten by 3 ticks at time of onset. She now reports paresthesia, numbness and burning to the arms bilaterally, legs bilaterally, and neck. She also reports intermittent HA, fever, and nausea.  She has tried Epson salt baths and drinking lemon water at home without any noticeable improvement. She has also tried OTC PhytoZon anti-inflammatory without any noticeable relief. Pt was seen by her PCP and treated with a 10 day course of Amoxicillin. However, she states symptoms have worsened since completing antibiotic. Labs were drawn at time of visit and she was advised to follow with a Rheumatologist. Pt and husband have suspicion for [facility] Fever as he was diagnosed in the past with similar symptoms. At this time she denies any fever or chills. She has no other pertinent past medical history. No other concerns this visit.  Past Medical History  Diagnosis Date  . Fibromyalgia   . Arthritis   . Dysrhythmia, cardiac   . Hypertension   . Bell's palsy    Past Surgical History  Procedure Laterality Date  . Tonsillectomy    . Colonoscopy N/A 01/23/2014    Procedure: COLONOSCOPY;  Surgeon: Rogene Houston, MD;  Location: [facility];  Service: Endoscopy;  Laterality: N/A;   200   Family History  Problem Relation Age of Onset  . Adopted: Yes  . Cancer Mother     lung   History  Substance Use Topics  . Smoking status: Former Smoker    Types: Cigarettes  . Smokeless tobacco: Never Used     Comment: quit smoking 10 yrs   . Alcohol Use: No   OB History   Grav Para Term Preterm Abortions TAB SAB Ect Mult Living                 Review of Systems  Constitutional: Negative for fever and chills.  HENT: Positive for sore throat.   Eyes: Negative for redness.  Respiratory: Negative for cough.   Skin: Negative for rash.  Neurological: Positive for numbness (Intermittent ). Negative for weakness.      Allergies  Avelox; Bee venom; Doxycycline; and Shellfish allergy  Home Medications   Prior to Admission medications   Medication Sig Start Date End Date Taking? Authorizing Provider  amoxicillin (AMOXIL) 500 MG tablet Take 1 tablet (500 mg total) by mouth 3 (three) times daily. 03/21/14   Kathyrn Drown, MD  dicyclomine (BENTYL) 10 MG capsule Take 1 capsule (10 mg total) by mouth 3 (three) times daily as needed for spasms. 01/23/14   Rogene Houston, MD  lactobacillus acidophilus (BACID) TABS tablet Take by mouth 3 (three) times daily.    Historical Provider, MD  miconazole (MICATIN)  2 % cream Apply 1 application topically 2 (two) times daily. 01/23/14   Rogene Houston, MD  Misc Natural Products (MULTI-HERB PO) Take by mouth.    Historical Provider, MD  Multiple Vitamin (MULTIVITAMIN) tablet Take 1 tablet by mouth daily.    Historical Provider, MD   Triage Vitals: BP 160/99  Pulse 94  Temp(Src) 98.8 F (37.1 C) (Oral)  Resp 16  SpO2 98%  LMP 01/23/2014   Physical Exam  Nursing note and vitals reviewed. Constitutional: She is oriented to person, place, and time. She appears well-developed and well-nourished.  HENT:  Head: Normocephalic and atraumatic.  Right Ear: Tympanic membrane, external ear and ear canal normal.  Left Ear: Tympanic membrane,  external ear and ear canal normal.  Nose: Nose normal. No mucosal edema or rhinorrhea.  Mouth/Throat: Uvula is midline, oropharynx is clear and moist and mucous membranes are normal. Mucous membranes are not dry. No oral lesions. No trismus in the jaw. No uvula swelling. No oropharyngeal exudate, posterior oropharyngeal edema, posterior oropharyngeal erythema or tonsillar abscesses.  Eyes: Conjunctivae, EOM and lids are normal. Pupils are equal, round, and reactive to light. Right eye exhibits no discharge. Left eye exhibits no discharge. Right eye exhibits no nystagmus. Left eye exhibits no nystagmus.  Neck: Normal range of motion. Neck supple.  Cardiovascular: Normal rate, regular rhythm and normal heart sounds.   Pulmonary/Chest: Effort normal and breath sounds normal. No respiratory distress. She has no wheezes. She has no rales.  Abdominal: Soft. She exhibits no distension. There is no tenderness.  Musculoskeletal: Normal range of motion. She exhibits tenderness.       Cervical back: She exhibits normal range of motion, no tenderness and no bony tenderness.  Patient with reported tenderness to muscles of upper and lower extremities.   Lymphadenopathy:    She has no cervical adenopathy.  Neurological: She is alert and oriented to person, place, and time. She has normal strength and normal reflexes. No cranial nerve deficit or sensory deficit. She displays a negative Romberg sign. Coordination and gait normal. GCS eye subscore is 4. GCS verbal subscore is 5. GCS motor subscore is 6.  Skin: Skin is warm and dry.  Psychiatric: She has a normal mood and affect.    ED Course  Procedures (including critical care time)  DIAGNOSTIC STUDIES: Oxygen Saturation is 98% on RA, Normal by my interpretation.    COORDINATION OF CARE: 6:20 PM- Will order [place] Spotted Fever IgG and IgM blood. Will order Rapid strep. Discussed treatment plan with pt at bedside and pt agreed to plan.     Labs  Review Labs Reviewed  RAPID STREP SCREEN  CULTURE, GROUP A STREP  ROCKY MTN SPOTTED FVR AB, IGG-BLOOD  ROCKY MTN SPOTTED FVR AB, IGM-BLOOD    Imaging Review No results found.   EKG Interpretation None      Strep neg, patient informed. RMSF titres pending. Patient counseled on conservative management, rheum f/u as directed by PCP.   MDM   Final diagnoses:  Myalgia  Sore throat   Patient presents with a myriad of different symptoms. She thinks she has a tick-borne illness. Patient was seen and treated by PCP with Amox x 10 days (doxy allergy). This is appropriate therapy. She does not agree and her and her husband are here demanding testing for RMSF. I do not feel this is entirely unreasonable given her reported intermittent fevers/myalgias/arthralgias -- however I have low suspicion that her symptoms are due to rickettsial disease.  I have reviewed PCP notes and treatment plan. I have reviewed recent labs which shows normal ESR and other labs. I agree with rheumatology follow-up. Patient seems upset at the limited amount we can do for her tonight and is reluctant to wait for rheumatology f/u. Unfortunately I do not have answers for her at this point and have tried to reassure her that her PCP is proceeding appropriately and she needs to continue this plan. I do not suspect any dangerous or life-threatening problems tonight. She does not have signs or risks for endo/myocarditis and she appears well.   No dangerous or life-threatening conditions suspected or identified by history, physical exam, and by work-up. No indications for hospitalization identified.    I personally performed the services described in this documentation, which was scribed in my presence. The recorded information has been reviewed and is accurate.    Carlisle Cater, PA-C 04/08/14 2028

## 2014-04-08 NOTE — Discharge Instructions (Signed)
Please read and follow all provided instructions.  Your diagnoses today include:  1. Myalgia   2. Sore throat     Tests performed today include:  Strep test: was negative for strep throat  Strep culture: you will be notified if this comes back positive  [place] [facility] Spotted fever test - will be back in 1-2 days  Vital signs. See below for your results today.   Medications prescribed:   None  Home care instructions:  Please read the educational materials provided and follow any instructions contained in this packet.  Follow-up instructions: Please follow-up with your primary care provider as needed for further evaluation of your symptoms.  If you do not have a primary care doctor -- see below for referral information.   Return instructions:   Please return to the Emergency Department if you experience worsening symptoms.   Return if you have worsening problems swallowing, your neck becomes swollen, you cannot swallow your saliva or your voice becomes muffled.   Return with high persistent fever, persistent vomiting, or if you have trouble breathing.   Please return if you have any other emergent concerns.  Additional Information:  Your vital signs today were: BP 160/99   Pulse 94   Temp(Src) 98.8 F (37.1 C) (Oral)   Resp 16   SpO2 98%   LMP 01/23/2014 If your blood pressure (BP) was elevated above 135/85 this visit, please have this repeated by your doctor within one month. --------------

## 2014-04-08 NOTE — ED Notes (Signed)
MD at bedside. 

## 2014-04-09 LAB — ROCKY MTN SPOTTED FVR AB, IGM-BLOOD: RMSF IGM: 0.25 IV (ref 0.00–0.89)

## 2014-04-09 LAB — ROCKY MTN SPOTTED FVR AB, IGG-BLOOD: RMSF IGG: 0.12 IV

## 2014-04-09 NOTE — ED Provider Notes (Signed)
Medical screening examination/treatment/procedure(s) were performed by non-physician practitioner and as supervising physician I was immediately available for consultation/collaboration.   EKG Interpretation None        Ephraim Hamburger, MD 04/09/14 [phone]

## 2014-04-10 LAB — CULTURE, GROUP A STREP

## 2014-04-20 ENCOUNTER — Emergency Department (HOSPITAL_COMMUNITY): Payer: BC Managed Care – PPO

## 2014-04-20 ENCOUNTER — Encounter (HOSPITAL_COMMUNITY): Payer: Self-pay | Admitting: Emergency Medicine

## 2014-04-20 ENCOUNTER — Emergency Department (HOSPITAL_COMMUNITY)
Admission: EM | Admit: 2014-04-20 | Discharge: 2014-04-20 | Disposition: A | Discharge status: Home / Self Care | Payer: BC Managed Care – PPO | Attending: Emergency Medicine | Admitting: Emergency Medicine

## 2014-04-20 DIAGNOSIS — Z79899 Other long term (current) drug therapy: Secondary | ICD-10-CM | POA: Insufficient documentation

## 2014-04-20 DIAGNOSIS — I10 Essential (primary) hypertension: Secondary | ICD-10-CM | POA: Insufficient documentation

## 2014-04-20 DIAGNOSIS — J209 Acute bronchitis, unspecified: Secondary | ICD-10-CM | POA: Insufficient documentation

## 2014-04-20 DIAGNOSIS — Z87891 Personal history of nicotine dependence: Secondary | ICD-10-CM | POA: Insufficient documentation

## 2014-04-20 DIAGNOSIS — Z8739 Personal history of other diseases of the musculoskeletal system and connective tissue: Secondary | ICD-10-CM | POA: Insufficient documentation

## 2014-04-20 DIAGNOSIS — Z8669 Personal history of other diseases of the nervous system and sense organs: Secondary | ICD-10-CM | POA: Insufficient documentation

## 2014-04-20 LAB — CBC WITH DIFFERENTIAL/PLATELET
BASOS ABS: 0 10*3/uL (ref 0.0–0.1)
Basophils Relative: 0 % (ref 0–1)
Eosinophils Absolute: 0.2 10*3/uL (ref 0.0–0.7)
Eosinophils Relative: 2 % (ref 0–5)
HEMATOCRIT: 41.8 % (ref 36.0–46.0)
Hemoglobin: 14.3 g/dL (ref 12.0–15.0)
LYMPHS PCT: 42 % (ref 12–46)
Lymphs Abs: 3.8 10*3/uL (ref 0.7–4.0)
MCH: 29.4 pg (ref 26.0–34.0)
MCHC: 34.2 g/dL (ref 30.0–36.0)
MCV: 86 fL (ref 78.0–100.0)
Monocytes Absolute: 0.8 10*3/uL (ref 0.1–1.0)
Monocytes Relative: 8 % (ref 3–12)
NEUTROS ABS: 4.3 10*3/uL (ref 1.7–7.7)
NEUTROS PCT: 48 % (ref 43–77)
Platelets: 294 10*3/uL (ref 150–400)
RBC: 4.86 MIL/uL (ref 3.87–5.11)
RDW: 16 % — AB (ref 11.5–15.5)
WBC: 9.1 10*3/uL (ref 4.0–10.5)

## 2014-04-20 LAB — COMPREHENSIVE METABOLIC PANEL
ALK PHOS: 90 U/L (ref 39–117)
ALT: 17 U/L (ref 0–35)
AST: 21 U/L (ref 0–37)
Albumin: 4 g/dL (ref 3.5–5.2)
BILIRUBIN TOTAL: 0.5 mg/dL (ref 0.3–1.2)
BUN: 21 mg/dL (ref 6–23)
CHLORIDE: 103 meq/L (ref 96–112)
CO2: 25 meq/L (ref 19–32)
Calcium: 9.9 mg/dL (ref 8.4–10.5)
Creatinine, Ser: 0.94 mg/dL (ref 0.50–1.10)
GFR calc Af Amer: 80 mL/min — ABNORMAL LOW (ref >90)
GFR, EST NON AFRICAN AMERICAN: 69 mL/min — AB (ref >90)
Glucose, Bld: 98 mg/dL (ref 70–99)
POTASSIUM: 3.7 meq/L (ref 3.7–5.3)
SODIUM: 141 meq/L (ref 137–147)
Total Protein: 7.6 g/dL (ref 6.0–8.3)

## 2014-04-20 LAB — PRO B NATRIURETIC PEPTIDE: Pro B Natriuretic peptide (BNP): 59.6 pg/mL (ref 0–125)

## 2014-04-20 LAB — TROPONIN I: Troponin I: <0.3 ng/mL (ref <0.30)

## 2014-04-20 LAB — D-DIMER, QUANTITATIVE: D-Dimer, Quant: 3.12 ug/mL-FEU — ABNORMAL HIGH (ref 0.00–0.48)

## 2014-04-20 MED ORDER — ALBUTEROL SULFATE HFA 108 (90 BASE) MCG/ACT IN AERS: 2.0000 | INHALATION_SPRAY | RESPIRATORY_TRACT | Status: DC | PRN

## 2014-04-20 MED ORDER — DIPHENHYDRAMINE HCL 50 MG/ML IJ SOLN
25.0000 mg | Freq: Once | INTRAMUSCULAR | Status: AC
  Administered 2014-04-20: 25 mg via INTRAVENOUS
  Filled 2014-04-20: qty 1

## 2014-04-20 MED ORDER — IOHEXOL 350 MG/ML SOLN
100.0000 mL | Freq: Once | INTRAVENOUS | Status: AC | PRN
  Administered 2014-04-20: 100 mL via INTRAVENOUS

## 2014-04-20 MED ORDER — IPRATROPIUM-ALBUTEROL 0.5-2.5 (3) MG/3ML IN SOLN
3.0000 mL | Freq: Once | RESPIRATORY_TRACT | Status: AC
  Administered 2014-04-20: 3 mL via RESPIRATORY_TRACT
  Filled 2014-04-20: qty 3

## 2014-04-20 MED ORDER — AZITHROMYCIN 250 MG PO TABS
500.0000 mg | ORAL_TABLET | Freq: Once | ORAL | Status: AC
  Administered 2014-04-20: 500 mg via ORAL
  Filled 2014-04-20: qty 2

## 2014-04-20 MED ORDER — AZITHROMYCIN 250 MG PO TABS: 250.0000 mg | ORAL_TABLET | Freq: Every day | ORAL | Status: DC

## 2014-04-20 MED ORDER — AZITHROMYCIN 250 MG PO TABS
ORAL_TABLET | ORAL | Status: AC
  Filled 2014-04-20: qty 1

## 2014-04-20 MED ORDER — ALBUTEROL SULFATE (2.5 MG/3ML) 0.083% IN NEBU: 2.5000 mg | INHALATION_SOLUTION | RESPIRATORY_TRACT | Status: DC | PRN

## 2014-04-20 MED ORDER — ASPIRIN 81 MG PO CHEW
324.0000 mg | CHEWABLE_TABLET | Freq: Once | ORAL | Status: AC
  Administered 2014-04-20: 324 mg via ORAL
  Filled 2014-04-20: qty 4

## 2014-04-20 MED ORDER — NITROGLYCERIN 0.4 MG SL SUBL
0.4000 mg | SUBLINGUAL_TABLET | SUBLINGUAL | Status: DC | PRN
  Filled 2014-04-20: qty 1

## 2014-04-20 NOTE — Discharge Instructions (Signed)
Acute Bronchitis °Bronchitis is inflammation of the airways that extend from the windpipe into the lungs (bronchi). The inflammation often causes mucus to develop. This leads to a cough, which is the most common symptom of bronchitis.  °In acute bronchitis, the condition usually develops suddenly and goes away over time, usually in a couple weeks. Smoking, allergies, and asthma can make bronchitis worse. Repeated episodes of bronchitis may cause further lung problems.  °CAUSES °Acute bronchitis is most often caused by the same virus that causes a cold. The virus can spread from person to person (contagious).  °SIGNS AND SYMPTOMS  °· Cough.   °· Fever.   °· Coughing up mucus.   °· Body aches.   °· Chest congestion.   °· Chills.   °· Shortness of breath.   °· Sore throat.   °DIAGNOSIS  °Acute bronchitis is usually diagnosed through a physical exam. Tests, such as chest X-rays, are sometimes done to rule out other conditions.  °TREATMENT  °Acute bronchitis usually goes away in a couple weeks. Often times, no medical treatment is necessary. Medicines are sometimes given for relief of fever or cough. Antibiotics are usually not needed but may be prescribed in certain situations. In some cases, an inhaler may be recommended to help reduce shortness of breath and control the cough. A cool mist vaporizer may also be used to help thin bronchial secretions and make it easier to clear the chest.  °HOME CARE INSTRUCTIONS °· Get plenty of rest.   °· Drink enough fluids to keep your urine clear or pale yellow (unless you have a medical condition that requires fluid restriction). Increasing fluids may help thin your secretions and will prevent dehydration.   °· Only take over-the-counter or prescription medicines as directed by your health care provider.   °· Avoid smoking and secondhand smoke. Exposure to cigarette smoke or irritating chemicals will make bronchitis worse. If you are a smoker, consider using nicotine gum or skin  patches to help control withdrawal symptoms. Quitting smoking will help your lungs heal faster.   °· Reduce the chances of another bout of acute bronchitis by washing your hands frequently, avoiding people with cold symptoms, and trying not to touch your hands to your mouth, nose, or eyes.   °· Follow up with your health care provider as directed.   °SEEK MEDICAL CARE IF: °Your symptoms do not improve after 1 week of treatment.  °SEEK IMMEDIATE MEDICAL CARE IF: °· You develop an increased fever or chills.   °· You have chest pain.   °· You have severe shortness of breath. °· You have bloody sputum.   °· You develop dehydration. °· You develop fainting. °· You develop repeated vomiting. °· You develop a severe headache. °MAKE SURE YOU:  °· Understand these instructions. °· Will watch your condition. °· Will get help right away if you are not doing well or get worse. °Document Released: 11/25/2004 Document Revised: 06/20/2013 Document Reviewed: 04/10/2013 °Exit[employer]® Patient Information ©2015 [employer]. This information is not intended to replace advice given to you by your health care provider. Make sure you discuss any questions you have with your health care provider. ° °

## 2014-04-20 NOTE — ED Notes (Signed)
Patient c/o nonradiating mid-chest pain that started 3 days ago with associated shortness of breath and nausea.  Patient states feels like her heart is racing and that something is pushing into her chest.

## 2014-04-20 NOTE — ED Notes (Signed)
Pt left with prescriptions and had stated understanding of all documentation.

## 2014-04-20 NOTE — ED Provider Notes (Addendum)
CSN: [id number]     Arrival date & time 04/20/14  1915 History   First MD Initiated Contact with Patient 04/20/14 1922     Chief Complaint  Patient presents with  . Chest Pain     (Consider location/radiation/quality/duration/timing/severity/associated sxs/prior Treatment) HPI Comments: Presents to the ER for evaluation of chest pain. Patient has been experiencing central chest pain that she describes as a pressure for 3 days. She reports that there is associated shortness of breath, and there is a cough which is nonproductive. The symptoms do come and go. She says that when she changes position or walks she feels the pressure. He gets her coughing as well which causes more symptoms. She gets a racing heartbeat when the symptoms occur.  Patient is a 53 y.o. female presenting with chest pain.  Chest Pain Associated symptoms: palpitations and shortness of breath     Past Medical History  Diagnosis Date  . Fibromyalgia   . Arthritis   . Dysrhythmia, cardiac   . Hypertension   . Bell's palsy    Past Surgical History  Procedure Laterality Date  . Tonsillectomy    . Colonoscopy N/A 01/23/2014    Procedure: COLONOSCOPY;  Surgeon: Rogene Houston, MD;  Location: [facility];  Service: Endoscopy;  Laterality: N/A;  200   Family History  Problem Relation Age of Onset  . Adopted: Yes  . Cancer Mother     lung   History  Substance Use Topics  . Smoking status: Former Smoker    Types: Cigarettes  . Smokeless tobacco: Never Used     Comment: quit smoking 10 yrs   . Alcohol Use: No   OB History   Grav Para Term Preterm Abortions TAB SAB Ect Mult Living                 Review of Systems  Respiratory: Positive for shortness of breath.   Cardiovascular: Positive for chest pain and palpitations.  All other systems reviewed and are negative.     Allergies  Avelox; Bee venom; Doxycycline; and Shellfish allergy  Home Medications   Prior to Admission medications   Medication  Sig Start Date End Date Taking? Authorizing Provider  lactobacillus acidophilus (BACID) TABS tablet Take by mouth 3 (three) times daily.   Yes Historical Provider, MD  Misc Natural Products (MULTI-HERB PO) Take by mouth.   Yes Historical Provider, MD  Multiple Vitamin (MULTIVITAMIN) tablet Take 1 tablet by mouth daily.   Yes Historical Provider, MD   BP 136/59  Pulse 94  Temp(Src) 98.3 F (36.8 C) (Oral)  Resp 18  Ht 5\' 5"  (1.651 m)  Wt 256 lb (116.121 kg)  BMI 42.60 kg/m2  SpO2 94%  LMP 01/23/2014 Physical Exam  Constitutional: She is oriented to person, place, and time. She appears well-developed and well-nourished. No distress.  HENT:  Head: Normocephalic and atraumatic.  Right Ear: Hearing normal.  Left Ear: Hearing normal.  Nose: Nose normal.  Mouth/Throat: Oropharynx is clear and moist and mucous membranes are normal.  Eyes: Conjunctivae and EOM are normal. Pupils are equal, round, and reactive to light.  Neck: Normal range of motion. Neck supple.  Cardiovascular: Regular rhythm, S1 normal and S2 normal.  Exam reveals no gallop and no friction rub.   No murmur heard. Pulmonary/Chest: Effort normal. No respiratory distress. She has decreased breath sounds. She exhibits tenderness.    Abdominal: Soft. Normal appearance and bowel sounds are normal. There is no hepatosplenomegaly. There is no  tenderness. There is no rebound, no guarding, no tenderness at McBurney's point and negative Murphy's sign. No hernia.  Musculoskeletal: Normal range of motion.  Neurological: She is alert and oriented to person, place, and time. She has normal strength. No cranial nerve deficit or sensory deficit. Coordination normal. GCS eye subscore is 4. GCS verbal subscore is 5. GCS motor subscore is 6.  Skin: Skin is warm, dry and intact. No rash noted. No cyanosis.  Psychiatric: She has a normal mood and affect. Her speech is normal and behavior is normal. Thought content normal.    ED Course    Procedures (including critical care time) Labs Review Labs Reviewed  CBC WITH DIFFERENTIAL - Abnormal; Notable for the following:    RDW 16.0 (*)    All other components within normal limits  COMPREHENSIVE METABOLIC PANEL - Abnormal; Notable for the following:    GFR calc non Af Amer 69 (*)    GFR calc Af Amer 80 (*)    All other components within normal limits  D-DIMER, QUANTITATIVE - Abnormal; Notable for the following:    D-Dimer, Quant 3.12 (*)    All other components within normal limits  PRO B NATRIURETIC PEPTIDE  TROPONIN I    Imaging Review Dg Chest 2 View  04/20/2014   CLINICAL DATA:  Short of breath, cough and fever  EXAM: CHEST  2 VIEW  COMPARISON:  Radiograph 11/09/2013  FINDINGS: Normal mediastinum and cardiac silhouette. Normal pulmonary vasculature. No evidence of effusion, infiltrate, or pneumothorax. No acute bony abnormality.  IMPRESSION: No acute cardiopulmonary process.   Electronically Signed   By: Suzy Bouchard M.D.   On: 04/20/2014 20:00   Ct Angio Chest Pe W/cm &/or Wo Cm  04/20/2014   CLINICAL DATA:  Mid chest pain for the past 3 days. Shortness of breath. Nausea.  EXAM: CT ANGIOGRAPHY CHEST WITH CONTRAST  TECHNIQUE: Multidetector CT imaging of the chest was performed using the standard protocol during bolus administration of intravenous contrast. Multiplanar CT image reconstructions and MIPs were obtained to evaluate the vascular anatomy.  CONTRAST:  148mL OMNIPAQUE IOHEXOL 350 MG/ML SOLN  COMPARISON:  No priors.  FINDINGS: Mediastinum: There are no filling defects within the pulmonary arterial tree to suggest underlying pulmonary embolism. Heart size is normal. There is no significant pericardial fluid, thickening or pericardial calcification. No pathologically enlarged mediastinal are hilar lymph nodes. Esophagus is unremarkable in appearance.  Lungs/Pleura: No acute consolidative airspace disease. No pleural effusions. No suspicious appearing pulmonary nodules  or masses. No pneumothorax.  Upper Abdomen: Unremarkable.  Musculoskeletal: There are no aggressive appearing lytic or blastic lesions noted in the visualized portions of the skeleton.  Review of the MIP images confirms the above findings.  IMPRESSION: 1. No evidence of pulmonary embolism. 2. No acute findings in the thorax to account for the patient's symptoms.   Electronically Signed   By: Vinnie Langton M.D.   On: 04/20/2014 21:42     EKG Interpretation   Date/Time:  Saturday April 20 2014 19:34:29 EDT Ventricular Rate:  97 PR Interval:  135 QRS Duration: 73 QT Interval:  346 QTC Calculation: 439 R Axis:   22 Text Interpretation:  Sinus rhythm Low voltage, precordial leads Baseline  wander in lead(s) II III aVF Otherwise within normal limits Confirmed by  POLLINA  MD, CHRISTOPHER [phone]) on 04/20/2014 8:10:07 PM      MDM   Final diagnoses:  None   bronchitis with bronchospasm  Patient presents to ER for chest  pressure, shortness of breath and cough. Symptoms have been present for 3 days, but only intermittently. She has noted that she gets some cough associated with exerting and then she feels the pressure and shortness of breath. This can occur at rest as well. This isn't clearly exertional chest pain, but more likely pulmonary in origin. Her EKG did not show any acute changes. Troponin was negative. This is reassuring after 3 days of symptoms. Chest x-ray did not show any acute pathology. She did have a markedly elevated d-dimer and PE was considered in the differential diagnosis. CT angiography was therefore performed. There is no PE and no other acute thoracic pathology.  Repeat examination reveals that the patient has had increasing cough. She feels short of breath secondary to this. Examination reveals diminished breath sounds and she seems to have some acute bronchospasm. She was given a DuoNeb and reexamined. She had increased air movement after the DuoNeb. This also produced  some increasing cough which supports the diagnosis of bronchospasm causing the complaints. Patient will be treated with a course of Zithromax and continue bronchodilator therapy (she has a nebulizer machine at home). She does not want to take steroids. She will follow up with her primary care doctor in the office earlier this week, return if she has any worsening symptoms.    Orpah Greek, MD 04/20/14 3300  Orpah Greek, MD 04/20/14 2215

## 2014-04-20 NOTE — ED Notes (Signed)
Pt refused ct, stated she was scared of ivp dye, wanted to speak with EDP. notifed EDP,he went into room to see pt.

## 2014-05-10 ENCOUNTER — Encounter: Payer: Self-pay | Admitting: Family Medicine

## 2014-05-10 ENCOUNTER — Ambulatory Visit (INDEPENDENT_AMBULATORY_CARE_PROVIDER_SITE_OTHER): Payer: Self-pay | Admitting: Family Medicine

## 2014-05-10 VITALS — BP 130/80 | Temp 98.3°F | Ht 65.0 in | Wt 257.4 lb

## 2014-05-10 DIAGNOSIS — R5381 Other malaise: Secondary | ICD-10-CM

## 2014-05-10 DIAGNOSIS — R059 Cough, unspecified: Secondary | ICD-10-CM

## 2014-05-10 DIAGNOSIS — R05 Cough: Secondary | ICD-10-CM

## 2014-05-10 DIAGNOSIS — R5383 Other fatigue: Principal | ICD-10-CM

## 2014-05-10 MED ORDER — CLARITHROMYCIN 500 MG PO TABS: 500.0000 mg | ORAL_TABLET | Freq: Two times a day (BID) | ORAL | Status: DC

## 2014-05-10 NOTE — Progress Notes (Signed)
   Subjective:    Patient ID: Summer [name], female    DOB: [birth year], 53 y.o.   MRN: [id number]  Cough This is a new problem. The problem has been gradually worsening. The cough is non-productive. Associated symptoms include chest pain and shortness of breath. Nothing aggravates the symptoms. Treatments tried: albuterol. The treatment provided no relief.  Patient was seen at [facility] ER about 2 weeks ago for these symptoms. Patient states that she was seen at a wellness center and they told her she has Mycoplasma and Chlamydia pneumonia. They prescribed her azithromycin but she did not take it because the pharmacist said it was not safe for her to take.   Long discussion held with the patient she will get the results of these labs fax to [place] she states that she has a thyroid disorder according to these labs ER notes were reviewed from June Review of Systems  Respiratory: Positive for cough and shortness of breath.   Cardiovascular: Positive for chest pain.   she denies fever vomiting diarrhea she does relate fatigue tiredness     Objective:   Physical Exam Lungs are clear hearts regular pulse normal extremities no edema skin warm dry ears normal throat normal       Assessment & Plan:  Persistent cough-she witnessed a wellness Dr. they did extensive testing and told her that she showed signs of Chlamydia pneumonia as well as Mycoplasma pneumonia she is worried about that they recommended azithromycin twice a day for 30 days she could not afford that nor did she want to take it I recommended that she take Biaxin 500 mg twice a day 10 days. She may use the inhaler on a when necessary basis. I find no evidence of pulmonary embolus no evidence of pneumonia. I believe this is a persistent upper respiratory illness some bronchial illness with secondary infection  In addition to this patient with extensive lab work which she will sent to [place] for review apparently it showed thyroid dysfunction at the  wellness Center recommended natural treatment for I am skeptical I await the results

## 2014-05-13 ENCOUNTER — Ambulatory Visit (INDEPENDENT_AMBULATORY_CARE_PROVIDER_SITE_OTHER): Payer: BC Managed Care – PPO | Admitting: Internal Medicine

## 2014-05-15 ENCOUNTER — Telehealth: Payer: Self-pay | Admitting: Family Medicine

## 2014-05-15 NOTE — Telephone Encounter (Signed)
Dr. Melissa Noon office declined referral, they are recommending referral to infectious disease.  Reason given was they do not see for fibromyalgia or lyme disease Please advise

## 2014-05-15 NOTE — Telephone Encounter (Signed)
Wow, this is becoming a frequent issue. I would recommend trying Dr.Truslow, I do not feel this patient has an infectious disease arthralgia

## 2014-05-17 ENCOUNTER — Telehealth (INDEPENDENT_AMBULATORY_CARE_PROVIDER_SITE_OTHER): Payer: Self-pay | Admitting: *Deleted

## 2014-05-17 ENCOUNTER — Encounter (INDEPENDENT_AMBULATORY_CARE_PROVIDER_SITE_OTHER): Payer: Self-pay | Admitting: *Deleted

## 2014-05-17 NOTE — Telephone Encounter (Signed)
Matea No Showed for her apt with Deberah Castle, NP on 05/13/14. A No Show letter has been mailed.

## 2014-06-05 ENCOUNTER — Emergency Department (HOSPITAL_COMMUNITY): Payer: BC Managed Care – PPO

## 2014-06-05 ENCOUNTER — Emergency Department (HOSPITAL_COMMUNITY)
Admission: EM | Admit: 2014-06-05 | Discharge: 2014-06-05 | Disposition: A | Discharge status: Home / Self Care | Payer: BC Managed Care – PPO | Attending: Emergency Medicine | Admitting: Emergency Medicine

## 2014-06-05 ENCOUNTER — Other Ambulatory Visit: Payer: Self-pay

## 2014-06-05 ENCOUNTER — Encounter (HOSPITAL_COMMUNITY): Payer: Self-pay | Admitting: Emergency Medicine

## 2014-06-05 DIAGNOSIS — R079 Chest pain, unspecified: Secondary | ICD-10-CM | POA: Insufficient documentation

## 2014-06-05 DIAGNOSIS — Z79899 Other long term (current) drug therapy: Secondary | ICD-10-CM | POA: Insufficient documentation

## 2014-06-05 DIAGNOSIS — Z862 Personal history of diseases of the blood and blood-forming organs and certain disorders involving the immune mechanism: Secondary | ICD-10-CM | POA: Insufficient documentation

## 2014-06-05 DIAGNOSIS — Z87891 Personal history of nicotine dependence: Secondary | ICD-10-CM | POA: Insufficient documentation

## 2014-06-05 DIAGNOSIS — Z8639 Personal history of other endocrine, nutritional and metabolic disease: Secondary | ICD-10-CM | POA: Insufficient documentation

## 2014-06-05 DIAGNOSIS — I10 Essential (primary) hypertension: Secondary | ICD-10-CM | POA: Insufficient documentation

## 2014-06-05 DIAGNOSIS — Z8669 Personal history of other diseases of the nervous system and sense organs: Secondary | ICD-10-CM | POA: Insufficient documentation

## 2014-06-05 DIAGNOSIS — R1013 Epigastric pain: Secondary | ICD-10-CM

## 2014-06-05 DIAGNOSIS — M129 Arthropathy, unspecified: Secondary | ICD-10-CM | POA: Insufficient documentation

## 2014-06-05 DIAGNOSIS — Z9889 Other specified postprocedural states: Secondary | ICD-10-CM | POA: Insufficient documentation

## 2014-06-05 DIAGNOSIS — Z7982 Long term (current) use of aspirin: Secondary | ICD-10-CM | POA: Insufficient documentation

## 2014-06-05 HISTORY — DX: Disorder of thyroid, unspecified: E07.9

## 2014-06-05 LAB — CBC WITH DIFFERENTIAL/PLATELET
BASOS PCT: 1 % (ref 0–1)
Basophils Absolute: 0 10*3/uL (ref 0.0–0.1)
EOS ABS: 0.1 10*3/uL (ref 0.0–0.7)
Eosinophils Relative: 1 % (ref 0–5)
HEMATOCRIT: 43.1 % (ref 36.0–46.0)
HEMOGLOBIN: 14.9 g/dL (ref 12.0–15.0)
LYMPHS ABS: 2.9 10*3/uL (ref 0.7–4.0)
Lymphocytes Relative: 41 % (ref 12–46)
MCH: 29.7 pg (ref 26.0–34.0)
MCHC: 34.6 g/dL (ref 30.0–36.0)
MCV: 85.9 fL (ref 78.0–100.0)
MONO ABS: 0.6 10*3/uL (ref 0.1–1.0)
MONOS PCT: 9 % (ref 3–12)
Neutro Abs: 3.4 10*3/uL (ref 1.7–7.7)
Neutrophils Relative %: 48 % (ref 43–77)
Platelets: 272 10*3/uL (ref 150–400)
RBC: 5.02 MIL/uL (ref 3.87–5.11)
RDW: 15.7 % — ABNORMAL HIGH (ref 11.5–15.5)
WBC: 7 10*3/uL (ref 4.0–10.5)

## 2014-06-05 LAB — BASIC METABOLIC PANEL
Anion gap: 14 (ref 5–15)
BUN: 10 mg/dL (ref 6–23)
CO2: 23 mEq/L (ref 19–32)
Calcium: 9.6 mg/dL (ref 8.4–10.5)
Chloride: 105 mEq/L (ref 96–112)
Creatinine, Ser: 0.73 mg/dL (ref 0.50–1.10)
GLUCOSE: 106 mg/dL — AB (ref 70–99)
Potassium: 3.6 mEq/L — ABNORMAL LOW (ref 3.7–5.3)
Sodium: 142 mEq/L (ref 137–147)

## 2014-06-05 LAB — HEPATIC FUNCTION PANEL
ALBUMIN: 4.2 g/dL (ref 3.5–5.2)
ALK PHOS: 74 U/L (ref 39–117)
ALT: 18 U/L (ref 0–35)
AST: 24 U/L (ref 0–37)
BILIRUBIN INDIRECT: 1 mg/dL — AB (ref 0.3–0.9)
BILIRUBIN TOTAL: 1.2 mg/dL (ref 0.3–1.2)
Bilirubin, Direct: 0.2 mg/dL (ref 0.0–0.3)
Total Protein: 7.1 g/dL (ref 6.0–8.3)

## 2014-06-05 LAB — TROPONIN I: Troponin I: <0.3 ng/mL (ref <0.30)

## 2014-06-05 MED ORDER — PANTOPRAZOLE SODIUM 40 MG IV SOLR
40.0000 mg | Freq: Once | INTRAVENOUS | Status: DC
  Filled 2014-06-05: qty 40

## 2014-06-05 MED ORDER — RANITIDINE HCL 150 MG PO CAPS
150.0000 mg | ORAL_CAPSULE | Freq: Every day | ORAL | Status: DC
Start: 2014-06-05 — End: 2014-08-05

## 2014-06-05 MED ORDER — ONDANSETRON HCL 4 MG/2ML IJ SOLN
4.0000 mg | Freq: Once | INTRAMUSCULAR | Status: DC
  Filled 2014-06-05: qty 2

## 2014-06-05 NOTE — ED Notes (Signed)
Pt called PCP this morning for intermittent CP x 2 days and was told to come to ED. Pt states burning and tightness to chest and back. States nausea at times.

## 2014-06-05 NOTE — Discharge Instructions (Signed)
Stop the biaxin,   Follow up with [place] cardiology

## 2014-06-05 NOTE — ED Provider Notes (Signed)
CSN: [id number]     Arrival date & time 06/05/14  1251 History   First MD Initiated Contact with Patient 06/05/14 1323     Chief Complaint  Patient presents with  . Chest Pain     (Consider location/radiation/quality/duration/timing/severity/associated sxs/prior Treatment) Patient is a 53 y.o. female presenting with abdominal pain. The history is provided by the patient (the pt complains of epigastric pain).  Abdominal Pain Pain location:  Epigastric Pain quality: aching   Pain radiates to:  L shoulder Pain severity:  Moderate Onset quality:  Sudden Timing:  Constant Progression:  Waxing and waning Chronicity:  New Context: not alcohol use   Associated symptoms: no chest pain, no cough, no diarrhea, no fatigue and no hematuria     Past Medical History  Diagnosis Date  . Fibromyalgia   . Arthritis   . Dysrhythmia, cardiac   . Hypertension   . Bell's palsy   . Thyroid disease    Past Surgical History  Procedure Laterality Date  . Tonsillectomy    . Colonoscopy N/A 01/23/2014    Procedure: COLONOSCOPY;  Surgeon: Rogene Houston, MD;  Location: [facility];  Service: Endoscopy;  Laterality: N/A;  200   Family History  Problem Relation Age of Onset  . Adopted: Yes  . Cancer Mother     lung   History  Substance Use Topics  . Smoking status: Former Smoker    Types: Cigarettes  . Smokeless tobacco: Never Used     Comment: quit smoking 10 yrs   . Alcohol Use: No   OB History   Grav Para Term Preterm Abortions TAB SAB Ect Mult Living                 Review of Systems  Constitutional: Negative for appetite change and fatigue.  HENT: Negative for congestion, ear discharge and sinus pressure.   Eyes: Negative for discharge.  Respiratory: Negative for cough.   Cardiovascular: Negative for chest pain.  Gastrointestinal: Positive for abdominal pain. Negative for diarrhea.  Genitourinary: Negative for frequency and hematuria.  Musculoskeletal: Negative for back pain.   Skin: Negative for rash.  Neurological: Negative for seizures and headaches.  Psychiatric/Behavioral: Negative for hallucinations.      Allergies  Avelox; Bee venom; Doxycycline; and Shellfish allergy  Home Medications   Prior to Admission medications   Medication Sig Start Date End Date Taking? Authorizing Provider  Ascorbic Acid (VITAMIN C) POWD Take 5 mLs by mouth 2 (two) times daily.   Yes Historical Provider, MD  aspirin EC 81 MG tablet Take 81 mg by mouth daily.   Yes Historical Provider, MD  Cholecalciferol (VITAMIN D-3) 5000 UNITS TABS Take 1 tablet by mouth daily.   Yes Historical Provider, MD  clarithromycin (BIAXIN) 500 MG tablet Take 1 tablet (500 mg total) by mouth 2 (two) times daily. 05/10/14  Yes Kathyrn Drown, MD  fluconazole (DIFLUCAN) 150 MG tablet Take 150 mg by mouth once.   Yes Historical Provider, MD  Folic Acid-Vit K4-MWN U27 (HOMOCYSTEINE FORMULA) 0.8-50-0.1 MG TABS Take 1 tablet by mouth daily.   Yes Historical Provider, MD  lactobacillus acidophilus (BACID) TABS tablet Take 1 tablet by mouth 2 (two) times daily.    Yes Historical Provider, MD  LIOTRIX, T3-T4, PO Take 1 capsule by mouth daily.   Yes Historical Provider, MD  Magnesium 200 MG TABS Take 2 tablets by mouth 4 (four) times daily.    Yes Historical Provider, MD  Multiple Vitamins-Minerals (CLINICAL NUTRIENTS  FOR WOMEN PO) Take 2 capsules by mouth 2 (two) times daily.   Yes Historical Provider, MD  OLIVE LEAF PO Take 1.5 tablets by mouth daily.   Yes Historical Provider, MD  OVER THE COUNTER MEDICATION Take 2 capsules by mouth 2 (two) times daily. PhytoZon   Yes Historical Provider, MD  albuterol (PROVENTIL) (2.5 MG/3ML) 0.083% nebulizer solution Take 3 mLs (2.5 mg total) by nebulization every 4 (four) hours as needed for wheezing or shortness of breath. 04/20/14   Orpah Greek, MD  ranitidine (ZANTAC) 150 MG capsule Take 1 capsule (150 mg total) by mouth daily. 06/05/14   Maudry Diego, MD    BP 117/63  Pulse 96  Temp(Src) 98.6 F (37 C) (Oral)  Resp 13  Ht 5\' 5"  (1.651 m)  Wt 252 lb (114.306 kg)  BMI 41.93 kg/m2  SpO2 100%  LMP 01/23/2014 Physical Exam  Constitutional: She is oriented to person, place, and time. She appears well-developed.  HENT:  Head: Normocephalic.  Eyes: Conjunctivae and EOM are normal. No scleral icterus.  Neck: Neck supple. No thyromegaly present.  Cardiovascular: Normal rate and regular rhythm.  Exam reveals no gallop and no friction rub.   No murmur heard. Pulmonary/Chest: No stridor. She has no wheezes. She has no rales. She exhibits no tenderness.  Abdominal: She exhibits no distension. There is tenderness. There is no rebound.  Tender epigastric  Musculoskeletal: Normal range of motion. She exhibits no edema.  Lymphadenopathy:    She has no cervical adenopathy.  Neurological: She is oriented to person, place, and time. She exhibits normal muscle tone. Coordination normal.  Skin: No rash noted. No erythema.  Psychiatric: She has a normal mood and affect. Her behavior is normal.    ED Course  Procedures (including critical care time) Labs Review Labs Reviewed  CBC WITH DIFFERENTIAL - Abnormal; Notable for the following:    RDW 15.7 (*)    All other components within normal limits  BASIC METABOLIC PANEL - Abnormal; Notable for the following:    Potassium 3.6 (*)    Glucose, Bld 106 (*)    All other components within normal limits  HEPATIC FUNCTION PANEL - Abnormal; Notable for the following:    Indirect Bilirubin 1.0 (*)    All other components within normal limits  TROPONIN I    Imaging Review Dg Chest 2 View  06/05/2014   CLINICAL DATA:  Chest pain  EXAM: CHEST  2 VIEW  COMPARISON:  04/20/2014  FINDINGS: Heart and mediastinal contours are within normal limits. No focal opacities or effusions. No acute bony abnormality. Scarring in the lingula.  IMPRESSION: No active cardiopulmonary disease.   Electronically Signed   By: Rolm Baptise M.D.   On: 06/05/2014 14:45   US Abdomen Complete  06/05/2014   CLINICAL DATA:  Abdominal pain and nausea  EXAM: ULTRASOUND ABDOMEN COMPLETE  COMPARISON:  CT abdomen and pelvis September 06, 2012  FINDINGS: Gallbladder:  No gallstones or wall thickening visualized. There is no pericholecystic fluid. No sonographic Murphy sign noted.  Common bile duct:  Diameter: 5 mm. There is no intrahepatic, common hepatic, or common bile duct dilatation.  Liver:  No focal lesion identified. Liver is enlarged measuring 19.1 cm in length. Liver echogenicity is mildly increased.  IVC:  No abnormality visualized.  Pancreas:  Visualized portion unremarkable. Portions of the pancreas are obscured by gas.  Spleen:  Size and appearance within normal limits.  Right Kidney:  Length: 11.2 cm. Echogenicity  within normal limits. No hydronephrosis visualized. There is a cyst arising from the lower pole of the right kidney measuring 3.1 x 3.2 x 3.2 cm, seen on prior CT is well. No other renal mass seen.  Left Kidney:  Length: 12.0 cm. Echogenicity within normal limits. No mass or hydronephrosis visualized.  Abdominal aorta:  No aneurysm visualized.  Other findings:  No demonstrable ascites.  IMPRESSION: Liver mildly enlarged with probable hepatic steatosis. While no focal liver lesions are identified, it must be cautioned that the sensitivity of ultrasound for focal liver lesions is diminished in this circumstance.  Portions of the pancreas are obscured by gas. Visualized portions of pancreas appear normal.  Lower pole right renal cyst.  Study otherwise unremarkable.   Electronically Signed   By: Lowella Grip M.D.   On: 06/05/2014 14:35     EKG Interpretation None    [id number] [id number]  MDM   Final diagnoses:  Epigastric pain    abd pain,  Possibly gastritis from biaxin,  Pt will be referred to cardiology for recheck       Maudry Diego, MD 06/05/14 1521

## 2014-06-05 NOTE — ED Notes (Signed)
At the time no chest burning.  Pt states it just comes and goes.

## 2014-06-06 ENCOUNTER — Encounter: Payer: Self-pay | Admitting: Cardiology

## 2014-06-06 ENCOUNTER — Ambulatory Visit (INDEPENDENT_AMBULATORY_CARE_PROVIDER_SITE_OTHER): Payer: BC Managed Care – PPO | Admitting: Cardiology

## 2014-06-06 ENCOUNTER — Other Ambulatory Visit (HOSPITAL_COMMUNITY): Payer: Self-pay | Admitting: Cardiology

## 2014-06-06 VITALS — BP 143/89 | HR 87 | Ht 65.0 in | Wt 244.0 lb

## 2014-06-06 DIAGNOSIS — R079 Chest pain, unspecified: Secondary | ICD-10-CM

## 2014-06-06 NOTE — Patient Instructions (Signed)
Your physician has requested that you have an echocardiogram. Echocardiography is a painless test that uses sound waves to create images of your heart. It provides your doctor with information about the size and shape of your heart and how well your heart's chambers and valves are working. This procedure takes approximately one hour. There are no restrictions for this procedure. Office will contact with results via phone or letter.   Continue all current medications. Follow up pending test results   

## 2014-06-06 NOTE — Progress Notes (Signed)
Clinical Summary Ms. Summer [name] is a 53 y.o.female seen today as a new patient for the following medical problems.  1. Chest pain - seen in ER 06/05/14 with chest pain/epigastric pain - burning pain between shoulder blades, midchest and epgastric. 4/10. Occurred at rest. +Nausea. + Belching. Tongue burning. Nothing made better or worst. Started 9AM, lasted until 9pm consistently. Constant pain with up and down severity. Has had similar pain in the past. Symptoms often associated with stress. - DOE with short distances. + LE edema, sleeps with head elevated due to relfux.  - from ER notes epigastrium was tender to palpation - trop neg x 1, CXR with no acute process. Abd [place] no specific pathology. EKG normal sinus rhythm  - prior ER visit 04/2014 with chest pain described as positional, worst with coughing. Elevated D-dimer, CT PE negative. Thought possible resp infection as cause of symtpoms.   CAD risk factors: HTN, prediabetes     Past Medical History  Diagnosis Date  . Fibromyalgia   . Arthritis   . Dysrhythmia, cardiac   . Hypertension   . Bell's palsy   . Thyroid disease      Allergies  Allergen Reactions  . Avelox [Moxifloxacin Hcl In Nacl] Other (See Comments)    "heart beats weird"  . Bee Venom Hives  . Doxycycline     Small whelps, not sure if allergy  . Shellfish Allergy Hives     Current Outpatient Prescriptions  Medication Sig Dispense Refill  . albuterol (PROVENTIL) (2.5 MG/3ML) 0.083% nebulizer solution Take 3 mLs (2.5 mg total) by nebulization every 4 (four) hours as needed for wheezing or shortness of breath.  25 vial  0  . Ascorbic Acid (VITAMIN C) POWD Take 5 mLs by mouth 2 (two) times daily.      Marland Kitchen aspirin EC 81 MG tablet Take 81 mg by mouth daily.      . Cholecalciferol (VITAMIN D-3) 5000 UNITS TABS Take 1 tablet by mouth daily.      . clarithromycin (BIAXIN) 500 MG tablet Take 1 tablet (500 mg total) by mouth 2 (two) times daily.  20 tablet  0  .  fluconazole (DIFLUCAN) 150 MG tablet Take 150 mg by mouth once.      . Folic Acid-Vit L8-VFI E33 (HOMOCYSTEINE FORMULA) 0.8-50-0.1 MG TABS Take 1 tablet by mouth daily.      Marland Kitchen lactobacillus acidophilus (BACID) TABS tablet Take 1 tablet by mouth 2 (two) times daily.       Marland Kitchen LIOTRIX, T3-T4, PO Take 1 capsule by mouth daily.      . Magnesium 200 MG TABS Take 2 tablets by mouth 4 (four) times daily.       . Multiple Vitamins-Minerals (CLINICAL NUTRIENTS FOR WOMEN PO) Take 2 capsules by mouth 2 (two) times daily.      Marland Kitchen OLIVE LEAF PO Take 1.5 tablets by mouth daily.      Marland Kitchen OVER THE COUNTER MEDICATION Take 2 capsules by mouth 2 (two) times daily. PhytoZon      . ranitidine (ZANTAC) 150 MG capsule Take 1 capsule (150 mg total) by mouth daily.  60 capsule  0   No current facility-administered medications for this visit.     Past Surgical History  Procedure Laterality Date  . Tonsillectomy    . Colonoscopy N/A 01/23/2014    Procedure: COLONOSCOPY;  Surgeon: Rogene Houston, MD;  Location: [facility];  Service: Endoscopy;  Laterality: N/A;  200  Allergies  Allergen Reactions  . Avelox [Moxifloxacin Hcl In Nacl] Other (See Comments)    "heart beats weird"  . Bee Venom Hives  . Doxycycline     Small whelps, not sure if allergy  . Shellfish Allergy Hives      Family History  Problem Relation Age of Onset  . Adopted: Yes  . Cancer Mother     lung     Social History Ms. Butler reports that she has quit smoking. Her smoking use included Cigarettes. She smoked 0.00 packs per day. She has never used smokeless tobacco. Ms. Fineberg reports that she does not drink alcohol.   Review of Systems CONSTITUTIONAL: No weight loss, fever, chills, weakness or fatigue.  HEENT: Eyes: No visual loss, blurred vision, double vision or yellow sclerae.No hearing loss, sneezing, congestion, runny nose or sore throat.  SKIN: No rash or itching.  CARDIOVASCULAR: per HPI RESPIRATORY: No shortness of  breath, cough or sputum.  GASTROINTESTINAL: No anorexia, nausea, vomiting or diarrhea. No abdominal pain or blood.  GENITOURINARY: No burning on urination, no polyuria NEUROLOGICAL: No headache, dizziness, syncope, paralysis, ataxia, numbness or tingling in the extremities. No change in bowel or bladder control.  MUSCULOSKELETAL: No muscle, back pain, joint pain or stiffness.  LYMPHATICS: No enlarged nodes. No history of splenectomy.  PSYCHIATRIC: No history of depression or anxiety.  ENDOCRINOLOGIC: No reports of sweating, cold or heat intolerance. No polyuria or polydipsia.  Marland Kitchen   Physical Examination p 87 bp 143/89 Wt 244 lbs BMI 41 Gen: resting comfortably, no acute distress HEENT: no scleral icterus, pupils equal round and reactive, no palptable cervical adenopathy,  CV: RRR, no m/r/g, no JVD, no carotid bruits Resp: Clear to auscultation bilaterally GI: abdomen is soft, non-tender, non-distended, normal bowel sounds, no hepatosplenomegaly MSK: extremities are warm, 1+ bilateral edema Skin: warm, no rash Neuro:  no focal deficits Psych: appropriate affect    Assessment and Plan  1. Chest pain - atypical chest pain based mainly on duration (12 hrs) and reproducibility by palpation - has had some DOE and LE edema over this same time setting, will obtain echocardiogram - likely will not pursue ischemic testing, will decide based on echo. Symptoms very atypical, she also has no insurance and medical costs are very big concern.    F/u pending echo results  Arnoldo Lenis, M.D., F.A.C.C.

## 2014-06-10 ENCOUNTER — Encounter: Payer: Self-pay | Admitting: Family Medicine

## 2014-06-10 ENCOUNTER — Ambulatory Visit (HOSPITAL_COMMUNITY)
Admission: RE | Admit: 2014-06-10 | Discharge: 2014-06-10 | Disposition: A | Discharge status: Home / Self Care | Payer: Self-pay | Source: Ambulatory Visit | Attending: Family Medicine | Admitting: Family Medicine

## 2014-06-10 ENCOUNTER — Ambulatory Visit (INDEPENDENT_AMBULATORY_CARE_PROVIDER_SITE_OTHER): Payer: Self-pay | Admitting: Family Medicine

## 2014-06-10 VITALS — BP 138/88 | Ht 65.0 in | Wt 247.6 lb

## 2014-06-10 DIAGNOSIS — E039 Hypothyroidism, unspecified: Secondary | ICD-10-CM | POA: Insufficient documentation

## 2014-06-10 DIAGNOSIS — R7309 Other abnormal glucose: Secondary | ICD-10-CM

## 2014-06-10 DIAGNOSIS — M25562 Pain in left knee: Secondary | ICD-10-CM

## 2014-06-10 DIAGNOSIS — R7303 Prediabetes: Secondary | ICD-10-CM

## 2014-06-10 DIAGNOSIS — E038 Other specified hypothyroidism: Secondary | ICD-10-CM

## 2014-06-10 DIAGNOSIS — M25569 Pain in unspecified knee: Secondary | ICD-10-CM | POA: Insufficient documentation

## 2014-06-10 NOTE — Progress Notes (Signed)
   Subjective:    Patient ID: Summer [name], female    DOB: Oct 09, 1961, 53 y.o.   MRN: [id number]  HPI  Patient arrives with left leg pain from her buttock to her knee- hurts to sit.  Patient also seeing Cardiologist who started her on Aspirin 81 mg. yesterday with severe pain  Review of Systems    she denies substernal chest pressure tightness pain or shortness of breath she does relate left leg pain and discomfort lungs clear hearts regular abdomen obese left leg subjective tenderness no appreciable swelling pains in the thigh and calf region Objective:   Physical Exam See above       Assessment & Plan:  A1C, TSH, Lipid- OCT with OV, do 2 subclinical hypothyroidism as well as prediabetes and hyperlipidemia. May need medications. Her other doctor prescribed metformin she has not started it yet  Stat leg U/S to rule out DVT, she has tenderness and discomfort there is no obvious swelling but we do need to rule out DVT. Patient at risk. If the ultrasound is negative then this could be I nerve impingement in her back  She has lost weight through diet and trying to stay active

## 2014-06-12 ENCOUNTER — Telehealth: Payer: Self-pay | Admitting: Cardiology

## 2014-06-12 NOTE — Telephone Encounter (Signed)
Spoke w/pt.  She no longer has [employer] and has paperwork to apply for [facility] Financial aid.  She will be self pay for 06-17-14

## 2014-06-17 ENCOUNTER — Other Ambulatory Visit (INDEPENDENT_AMBULATORY_CARE_PROVIDER_SITE_OTHER): Payer: Self-pay

## 2014-06-17 ENCOUNTER — Other Ambulatory Visit: Payer: Self-pay

## 2014-06-17 DIAGNOSIS — R609 Edema, unspecified: Secondary | ICD-10-CM

## 2014-06-17 DIAGNOSIS — R079 Chest pain, unspecified: Secondary | ICD-10-CM

## 2014-06-19 ENCOUNTER — Other Ambulatory Visit: Payer: Self-pay | Admitting: *Deleted

## 2014-06-19 ENCOUNTER — Telehealth: Payer: Self-pay | Admitting: *Deleted

## 2014-06-19 MED ORDER — FUROSEMIDE 20 MG PO TABS: ORAL_TABLET | ORAL | Status: DC

## 2014-06-19 NOTE — Telephone Encounter (Signed)
Info relayed to pt. Rx for lasix 20mg  po qday PRN swelling or SOB sent to pharm. Pt is stil concerned of ongoing chest pain. Fears she will have a heart attack. Says chest pains are ongoing frequently throughout the day

## 2014-06-19 NOTE — Telephone Encounter (Signed)
Message copied by Orion Modest on Wed Jun 19, 2014  3:04 PM ------      Message from: [place] F      Created: Wed Jun 19, 2014  2:10 PM       Overall her echo looks good, her heart is nice and strong. Her heart muscle is a little thick/stiff, this often happens as we get older and if there is a history of HTN. This can cause a little bit of swelling and SOB. The treatment is blood pressure control and using a water pill as needed. I would give Rx for lasix 20mg  po qday PRN swelling or SOB.                   Zandra Abts MD ------

## 2014-06-21 NOTE — Telephone Encounter (Signed)
Relayed info to patient. Advised her to f/u with PCP. Advised to go to ER if chest pains worsens

## 2014-06-21 NOTE — Telephone Encounter (Signed)
Message copied by Orion Modest on Fri Jun 21, 2014  9:39 AM ------      Message from: [place] F      Created: Thu Jun 20, 2014  4:37 PM       She has very atypical chest pain with a normal echo. There is no evidence that its her heart. She needs to follow up with her primary at this time for management.             J Branch ------

## 2014-08-05 ENCOUNTER — Emergency Department (HOSPITAL_COMMUNITY)
Admission: EM | Admit: 2014-08-05 | Discharge: 2014-08-05 | Disposition: A | Discharge status: Home / Self Care | Payer: Self-pay | Attending: Emergency Medicine | Admitting: Emergency Medicine

## 2014-08-05 ENCOUNTER — Ambulatory Visit: Payer: Self-pay | Admitting: Family Medicine

## 2014-08-05 ENCOUNTER — Emergency Department (HOSPITAL_COMMUNITY): Payer: Self-pay

## 2014-08-05 ENCOUNTER — Encounter (HOSPITAL_COMMUNITY): Payer: Self-pay | Admitting: Emergency Medicine

## 2014-08-05 DIAGNOSIS — I10 Essential (primary) hypertension: Secondary | ICD-10-CM | POA: Insufficient documentation

## 2014-08-05 DIAGNOSIS — R0600 Dyspnea, unspecified: Secondary | ICD-10-CM | POA: Insufficient documentation

## 2014-08-05 DIAGNOSIS — E079 Disorder of thyroid, unspecified: Secondary | ICD-10-CM | POA: Insufficient documentation

## 2014-08-05 DIAGNOSIS — E669 Obesity, unspecified: Secondary | ICD-10-CM | POA: Insufficient documentation

## 2014-08-05 DIAGNOSIS — Z8669 Personal history of other diseases of the nervous system and sense organs: Secondary | ICD-10-CM | POA: Insufficient documentation

## 2014-08-05 DIAGNOSIS — M797 Fibromyalgia: Secondary | ICD-10-CM | POA: Insufficient documentation

## 2014-08-05 DIAGNOSIS — Z7982 Long term (current) use of aspirin: Secondary | ICD-10-CM | POA: Insufficient documentation

## 2014-08-05 DIAGNOSIS — Z87891 Personal history of nicotine dependence: Secondary | ICD-10-CM | POA: Insufficient documentation

## 2014-08-05 DIAGNOSIS — R002 Palpitations: Secondary | ICD-10-CM | POA: Insufficient documentation

## 2014-08-05 DIAGNOSIS — M1990 Unspecified osteoarthritis, unspecified site: Secondary | ICD-10-CM | POA: Insufficient documentation

## 2014-08-05 DIAGNOSIS — Z79899 Other long term (current) drug therapy: Secondary | ICD-10-CM | POA: Insufficient documentation

## 2014-08-05 LAB — COMPREHENSIVE METABOLIC PANEL
ALBUMIN: 4 g/dL (ref 3.5–5.2)
ALK PHOS: 92 U/L (ref 39–117)
ALT: 16 U/L (ref 0–35)
AST: 18 U/L (ref 0–37)
Anion gap: 13 (ref 5–15)
BILIRUBIN TOTAL: 0.5 mg/dL (ref 0.3–1.2)
BUN: 24 mg/dL — ABNORMAL HIGH (ref 6–23)
CHLORIDE: 105 meq/L (ref 96–112)
CO2: 25 meq/L (ref 19–32)
CREATININE: 0.67 mg/dL (ref 0.50–1.10)
Calcium: 9.6 mg/dL (ref 8.4–10.5)
GFR calc Af Amer: >90 mL/min (ref >90)
Glucose, Bld: 105 mg/dL — ABNORMAL HIGH (ref 70–99)
POTASSIUM: 3.9 meq/L (ref 3.7–5.3)
SODIUM: 143 meq/L (ref 137–147)
Total Protein: 7.6 g/dL (ref 6.0–8.3)

## 2014-08-05 LAB — CBC WITH DIFFERENTIAL/PLATELET
BASOS ABS: 0 10*3/uL (ref 0.0–0.1)
Basophils Relative: 0 % (ref 0–1)
Eosinophils Absolute: 0.2 10*3/uL (ref 0.0–0.7)
Eosinophils Relative: 3 % (ref 0–5)
HCT: 45.4 % (ref 36.0–46.0)
Hemoglobin: 15.8 g/dL — ABNORMAL HIGH (ref 12.0–15.0)
LYMPHS PCT: 31 % (ref 12–46)
Lymphs Abs: 2.8 10*3/uL (ref 0.7–4.0)
MCH: 30.4 pg (ref 26.0–34.0)
MCHC: 34.8 g/dL (ref 30.0–36.0)
MCV: 87.3 fL (ref 78.0–100.0)
MONO ABS: 0.7 10*3/uL (ref 0.1–1.0)
Monocytes Relative: 8 % (ref 3–12)
NEUTROS ABS: 5.2 10*3/uL (ref 1.7–7.7)
NEUTROS PCT: 58 % (ref 43–77)
Platelets: 254 10*3/uL (ref 150–400)
RBC: 5.2 MIL/uL — ABNORMAL HIGH (ref 3.87–5.11)
RDW: 16 % — AB (ref 11.5–15.5)
WBC: 9 10*3/uL (ref 4.0–10.5)

## 2014-08-05 LAB — TROPONIN I: Troponin I: <0.3 ng/mL (ref <0.30)

## 2014-08-05 LAB — MAGNESIUM: Magnesium: 2.4 mg/dL (ref 1.5–2.5)

## 2014-08-05 MED ORDER — METOPROLOL SUCCINATE ER 25 MG PO TB24: 25.0000 mg | ORAL_TABLET | Freq: Every day | ORAL | Status: DC

## 2014-08-05 NOTE — Discharge Instructions (Signed)
Take the medication when you feel palpitations. Followup with your cardiologist

## 2014-08-05 NOTE — ED Notes (Signed)
Intermittent "heart racing "  Since Saturday with sob.  And chest pain

## 2014-08-05 NOTE — ED Provider Notes (Signed)
CSN: [id number]     Arrival date & time 08/05/14  1109 History   First MD Initiated Contact with Patient 08/05/14 1218     Chief Complaint  Patient presents with  . Tachycardia     (Consider location/radiation/quality/duration/timing/severity/associated sxs/prior Treatment) HPI... patient awoke at 4:30 AM today with a sense of her heart racing with associated dyspnea, dizziness. No chest pain. She was seen by a [place] cardiologist approximately 2 months ago with a negative echocardiogram. Nonsmoker. Symptoms are intermittent and not associated with any activity. Nothing makes symptoms better or worse   Past Medical History  Diagnosis Date  . Fibromyalgia   . Arthritis   . Dysrhythmia, cardiac   . Hypertension   . Bell's palsy   . Thyroid disease    Past Surgical History  Procedure Laterality Date  . Tonsillectomy    . Colonoscopy N/A 01/23/2014    Procedure: COLONOSCOPY;  Surgeon: Rogene Houston, MD;  [facility]: [facility];  Service: Endoscopy;  Laterality: N/A;  200   Family History  Problem Relation Age of Onset  . Adopted: Yes  . Cancer Mother     lung   History  Substance Use Topics  . Smoking status: Former Smoker    Types: Cigarettes  . Smokeless tobacco: Never Used     Comment: quit smoking 10 yrs   . Alcohol Use: No   OB History   Grav Para Term Preterm Abortions TAB SAB Ect Mult Living                 Review of Systems  All other systems reviewed and are negative.     Allergies  Avelox; Bee venom; Doxycycline; and Shellfish allergy  Home Medications   Prior to Admission medications   Medication Sig Start Date End Date Taking? Authorizing Provider  albuterol (PROVENTIL) (2.5 MG/3ML) 0.083% nebulizer solution Take 3 mLs (2.5 mg total) by nebulization every 4 (four) hours as needed for wheezing or shortness of breath. 04/20/14  Yes Orpah Greek, MD  Ascorbic Acid (VITAMIN C) POWD Take 5 mLs by mouth 2 (two) times daily.   Yes Historical  Provider, MD  aspirin EC 81 MG tablet Take 81 mg by mouth daily.   Yes Historical Provider, MD  Cholecalciferol (VITAMIN D-3) 5000 UNITS TABS Take 1 tablet by mouth daily.   Yes Historical Provider, MD  Folic Acid-Vit K2-HCW C37 (HOMOCYSTEINE FORMULA) 0.8-50-0.1 MG TABS Take 1 tablet by mouth daily.   Yes Historical Provider, MD  lactobacillus acidophilus (BACID) TABS tablet Take 1 tablet by mouth 2 (two) times daily.    Yes Historical Provider, MD  LIOTRIX, T3-T4, PO Take 1 capsule by mouth daily.   Yes Historical Provider, MD  Magnesium 200 MG TABS Take 2 tablets by mouth 2 (two) times daily.    Yes Historical Provider, MD  Multiple Vitamins-Minerals (CLINICAL NUTRIENTS FOR WOMEN PO) Take 2 capsules by mouth 2 (two) times daily.   Yes Historical Provider, MD  nystatin (MYCOSTATIN) 100000 UNIT/ML suspension Take 5 mLs by mouth 4 (four) times daily.   Yes Historical Provider, MD  OLIVE LEAF PO Take 1.5 tablets by mouth daily.   Yes Historical Provider, MD  OVER THE COUNTER MEDICATION Take 2 capsules by mouth 2 (two) times daily. PhytoZon   Yes Historical Provider, MD  metoprolol succinate (TOPROL-XL) 25 MG 24 hr tablet Take 1 tablet (25 mg total) by mouth daily. 08/05/14   Nat Christen, MD   BP 133/70  Pulse 89  Temp(Src) 98.4 F (36.9 C) (Oral)  Resp 13  Ht 5\' 5"  (1.651 m)  Wt 236 lb (107.049 kg)  BMI 39.27 kg/m2  SpO2 99%  LMP 01/23/2014 Physical Exam  Nursing note and vitals reviewed. Constitutional: She is oriented to person, place, and time. She appears well-developed and well-nourished.  Obese, no acute distress  HENT:  Head: Normocephalic and atraumatic.  Eyes: Conjunctivae and EOM are normal. Pupils are equal, round, and reactive to light.  Neck: Normal range of motion. Neck supple.  Cardiovascular: Normal rate, regular rhythm and normal heart sounds.   Pulmonary/Chest: Effort normal and breath sounds normal.  Abdominal: Soft. Bowel sounds are normal.  Musculoskeletal: Normal  range of motion.  Neurological: She is alert and oriented to person, place, and time.  Skin: Skin is warm and dry.  Psychiatric: She has a normal mood and affect. Her behavior is normal.    ED Course  Procedures (including critical care time) Labs Review Labs Reviewed  CBC WITH DIFFERENTIAL - Abnormal; Notable for the following:    RBC 5.20 (*)    Hemoglobin 15.8 (*)    RDW 16.0 (*)    All other components within normal limits  COMPREHENSIVE METABOLIC PANEL - Abnormal; Notable for the following:    Glucose, Bld 105 (*)    BUN 24 (*)    All other components within normal limits  TROPONIN I  MAGNESIUM    Imaging Review Dg Chest 2 View  08/05/2014   CLINICAL DATA:  Tachycardia and shortness of breath off and on since June of 2015. Symptoms are worsening. Feels like rattling when talking. Worse on the left side for 3 days. History of pneumonia since the end of August 2015. Hypertension.  EXAM: CHEST  2 VIEW  COMPARISON:  06/05/2014  FINDINGS: The heart size and mediastinal contours are within normal limits. Both lungs are clear. The visualized skeletal structures are unremarkable.  IMPRESSION: No active cardiopulmonary disease.   Electronically Signed   By: Shon Hale M.D.   On: 08/05/2014 11:51     EKG Interpretation   Date/Time:  Monday August 05 2014 11:28:18 EDT Ventricular Rate:  80 PR Interval:  124 QRS Duration: 72 QT Interval:  366 QTC Calculation: 422 R Axis:   7 Text Interpretation:  Normal sinus rhythm Low voltage QRS Borderline ECG  Confirmed by Derel Mcglasson  MD, Tysin Salada ([id number]) on 08/05/2014 12:11:17 PM      MDM   Final diagnoses:  Palpitations    Patient was on the monitor for greater than 2 hours with no ectopy. Screening labs, troponin, magnesium, EKG, chest x-ray all negative. I suggested she take a metoprolol 25 mg extended-release tablet on a prn basis for her sense of palpitations. She will followup with cardiology     Nat Christen, MD 08/05/14 1553

## 2015-05-24 IMAGING — US US EXTREM LOW VENOUS*L*
1 series · 14 of 24 positions shown · non-contrast
Comparison: None.

CLINICAL DATA: Lower extremity pain

EXAM:
LEFT LOWER EXTREMITY VENOUS DUPLEX ULTRASOUND
TECHNIQUE: Gray-scale sonography with graded compression, as well as color
Doppler and duplex ultrasound were performed to evaluate the lower
extremity deep venous systems from the level of the common femoral
vein and including the common femoral, femoral, profunda femoral,
popliteal and calf veins including the posterior tibial, peroneal
and gastrocnemius veins when visible. The superficial great
saphenous vein was also interrogated. Spectral Doppler was utilized
to evaluate flow at rest and with distal augmentation maneuvers in
the common femoral, femoral and popliteal veins.

[Series 1: us extrem low venous*left* · 0.08mm/px · 14 of 40 slices shown]
[im 1/40]
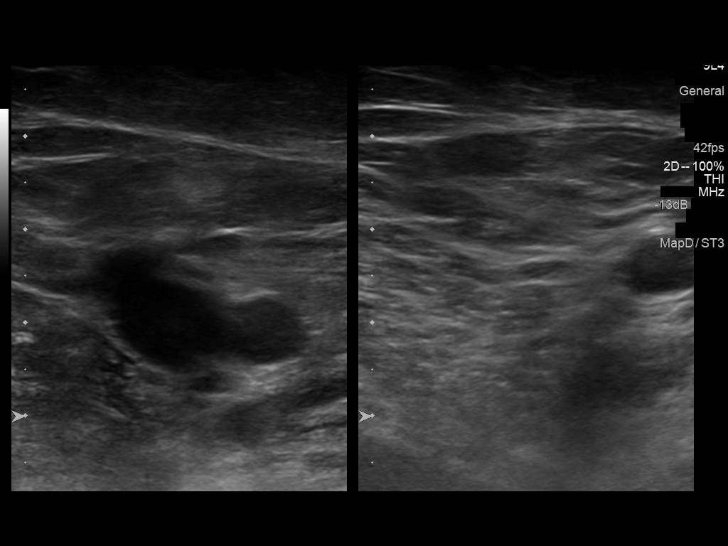
[im 4/40]
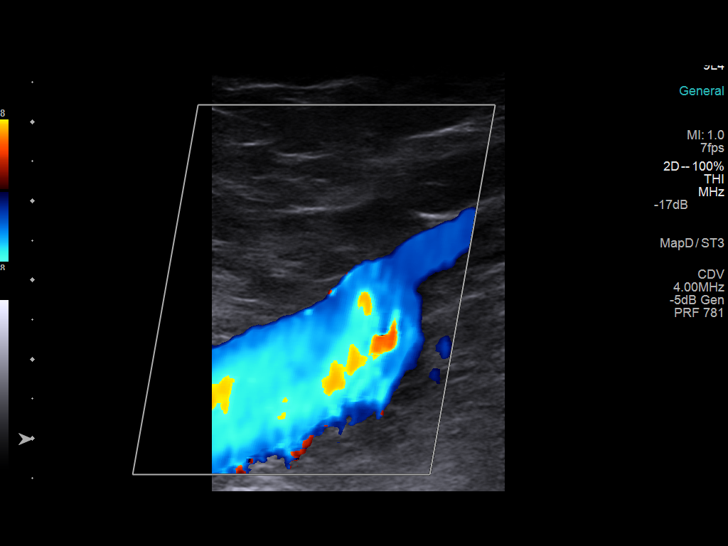
[im 7/40]
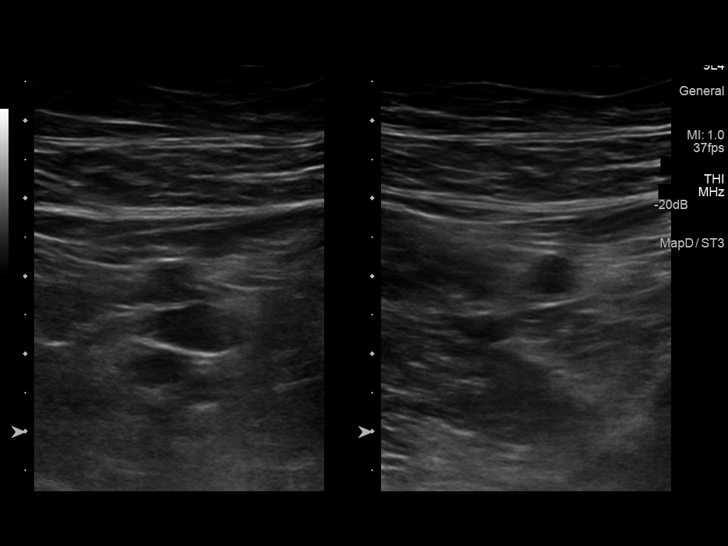
[im 11/40]
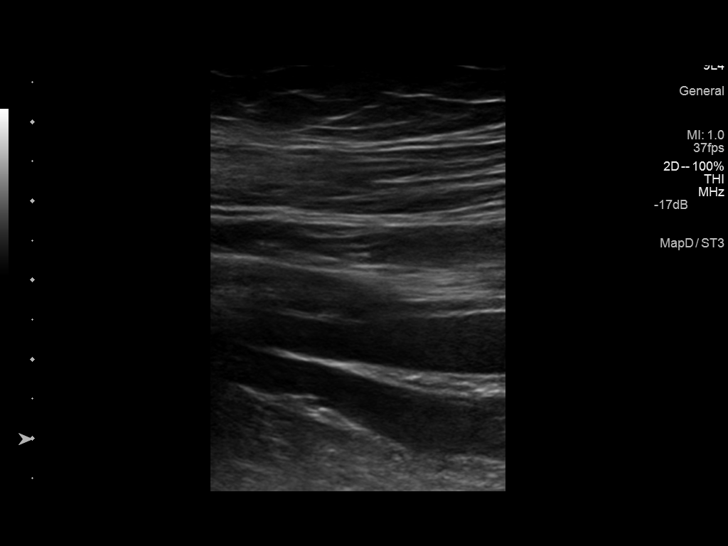
[im 12/40]
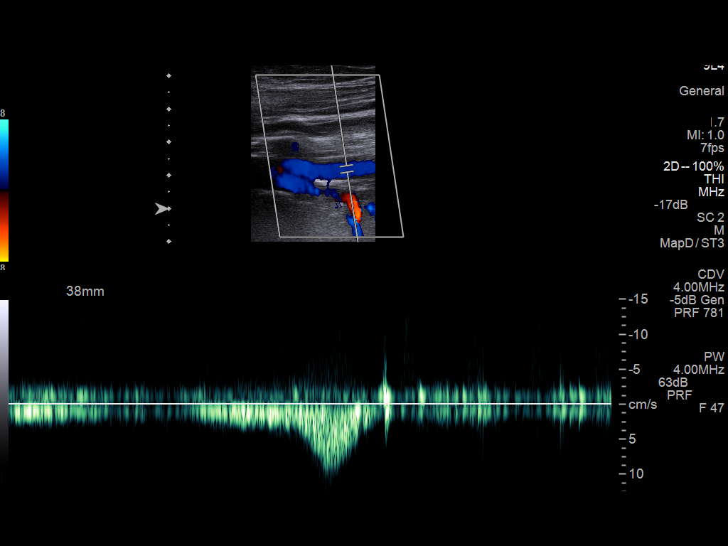
[im 16/40]
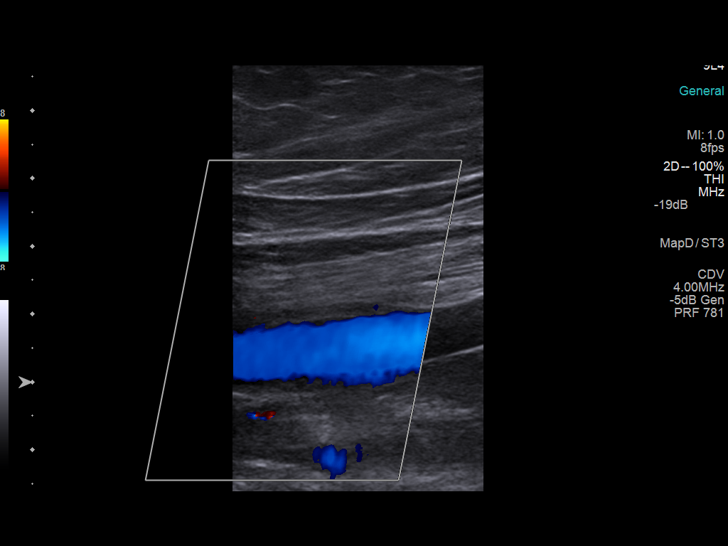
[im 19/40]
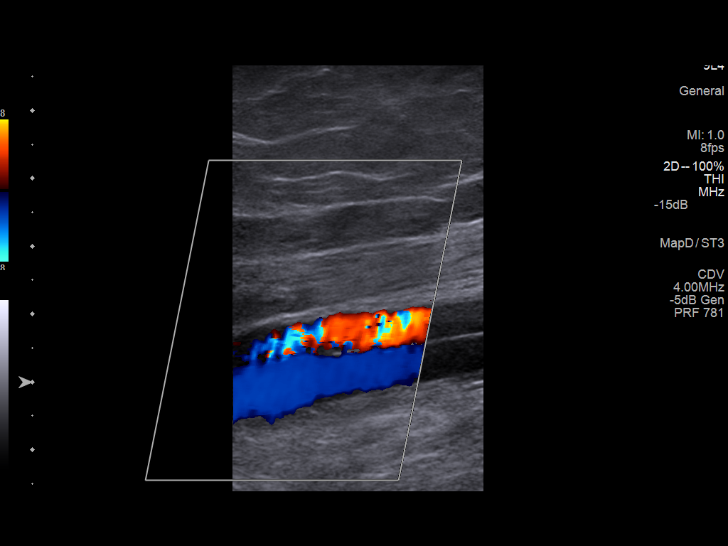
[im 21/40]
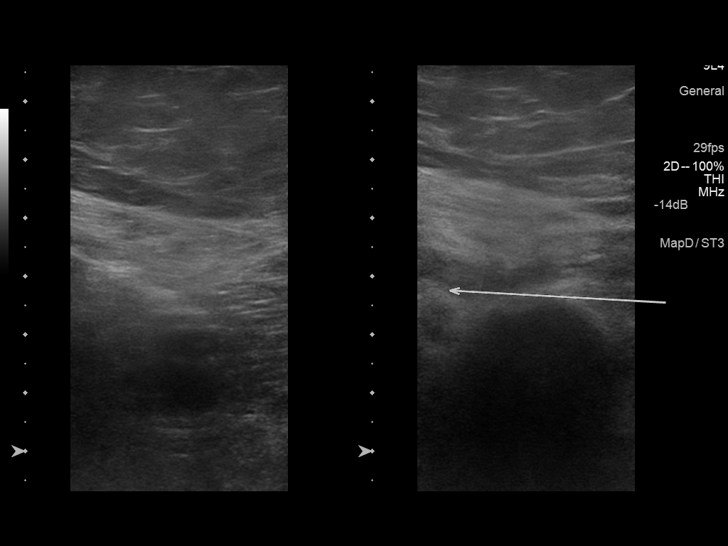
[im 24/40]
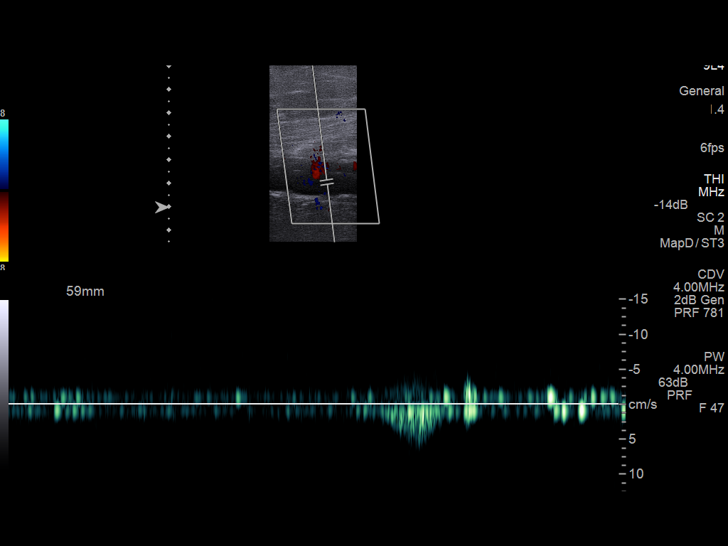
[im 28/40]
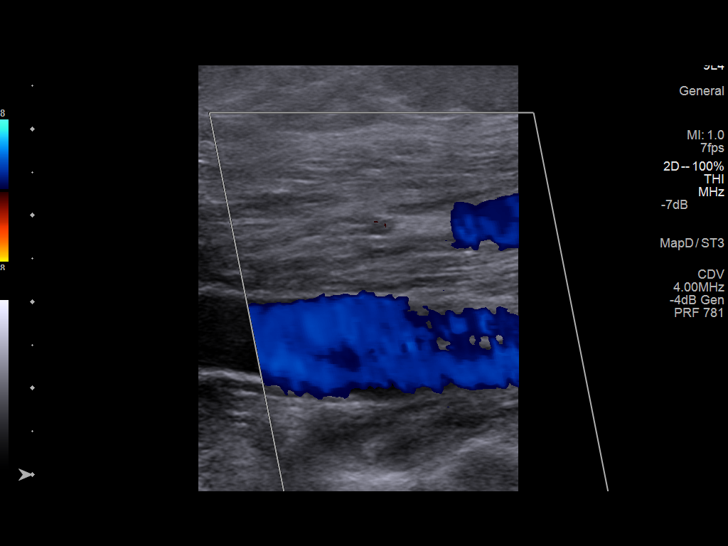
[im 31/40]
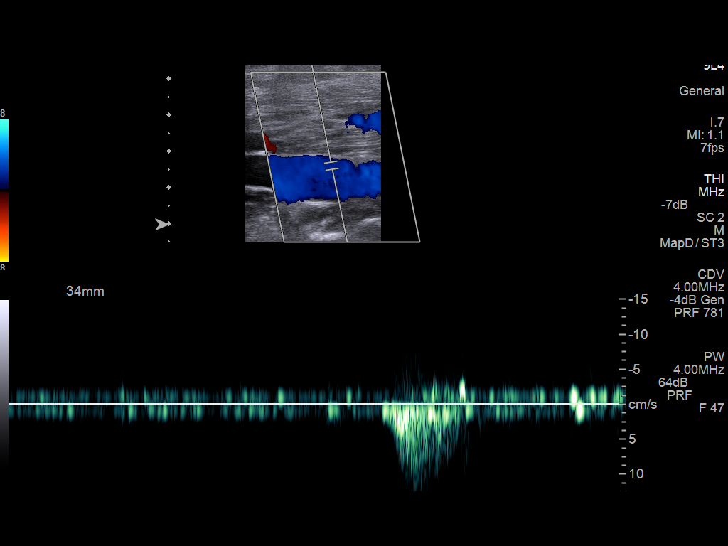
[im 33/40]
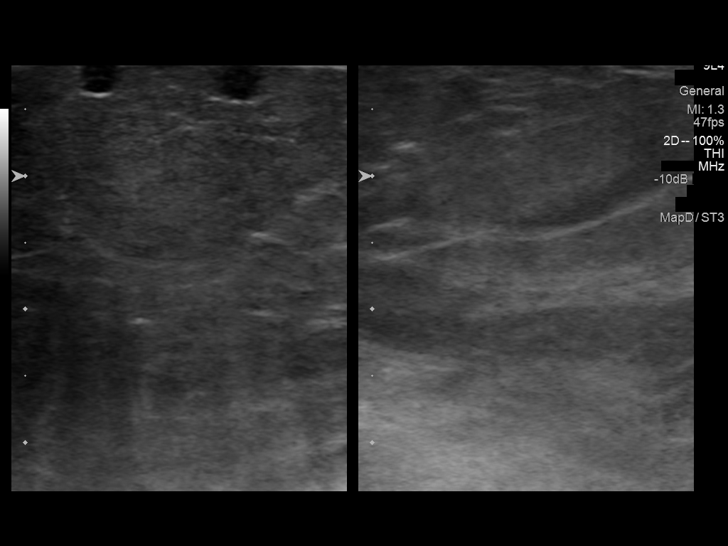
[im 36/40]
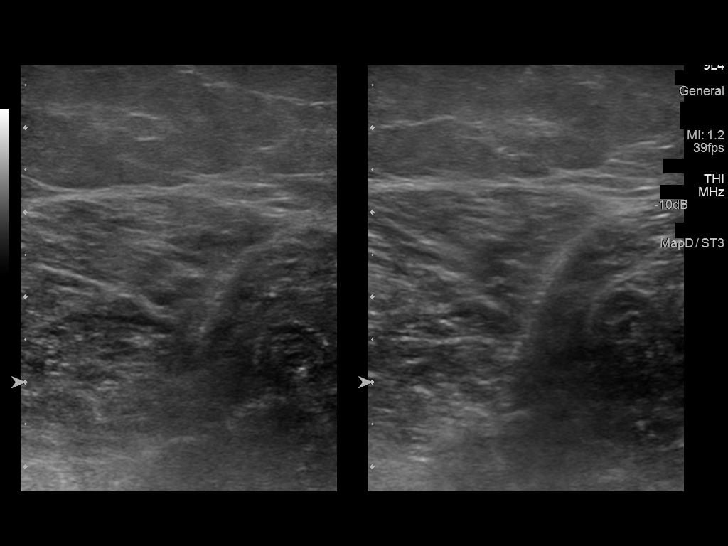
[im 40/40]
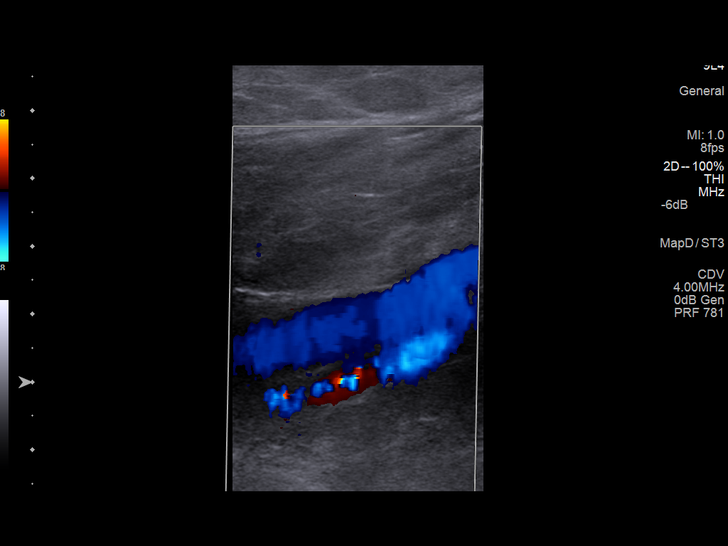

[14 of 24 positions shown; findings below may reference images not displayed]

FINDINGS: Flow in the venous structures of the left lower extremity is
spontaneous and phasic in all segments. There is normal compression
and augmentation in the venous structures of left lower extremity.
Venous Doppler signal is normal in all regions. There is no thrombus
in the deep or visualized superficial venous structures on the left.
There is no left lower extremity deep venous incompetence.
IMPRESSION: No evidence of left lower extremity deep venous thrombosis.

## 2016-02-05 ENCOUNTER — Encounter: Payer: Self-pay | Admitting: Nurse Practitioner

## 2016-02-05 ENCOUNTER — Ambulatory Visit (INDEPENDENT_AMBULATORY_CARE_PROVIDER_SITE_OTHER): Payer: Managed Care, Other (non HMO) | Admitting: Nurse Practitioner

## 2016-02-05 VITALS — BP 122/90 | Temp 98.3°F | Ht 65.0 in | Wt 261.0 lb

## 2016-02-05 DIAGNOSIS — Z13220 Encounter for screening for lipoid disorders: Secondary | ICD-10-CM | POA: Diagnosis not present

## 2016-02-05 DIAGNOSIS — E038 Other specified hypothyroidism: Secondary | ICD-10-CM

## 2016-02-05 DIAGNOSIS — J329 Chronic sinusitis, unspecified: Secondary | ICD-10-CM | POA: Diagnosis not present

## 2016-02-05 DIAGNOSIS — R7303 Prediabetes: Secondary | ICD-10-CM | POA: Diagnosis not present

## 2016-02-05 DIAGNOSIS — E876 Hypokalemia: Secondary | ICD-10-CM

## 2016-02-05 DIAGNOSIS — E039 Hypothyroidism, unspecified: Secondary | ICD-10-CM

## 2016-02-05 MED ORDER — ALBUTEROL SULFATE (2.5 MG/3ML) 0.083% IN NEBU: 2.5000 mg | INHALATION_SOLUTION | RESPIRATORY_TRACT | Status: DC | PRN

## 2016-02-05 MED ORDER — ALBUTEROL SULFATE HFA 108 (90 BASE) MCG/ACT IN AERS: 2.0000 | INHALATION_SPRAY | RESPIRATORY_TRACT | Status: DC | PRN

## 2016-02-05 MED ORDER — THYROID 60 MG PO TABS: 60.0000 mg | ORAL_TABLET | Freq: Every day | ORAL | Status: DC

## 2016-02-05 MED ORDER — AMOXICILLIN 500 MG PO CAPS: 500.0000 mg | ORAL_CAPSULE | Freq: Three times a day (TID) | ORAL | Status: DC

## 2016-02-05 NOTE — Progress Notes (Signed)
Subjective:  Presents for complaints of cough and sinus symptoms over the past 2 weeks worse over the past 2-3 days. Low-grade fever. Sore throat. Facial area headache. Yellow nasal drainage. Spells of coughing producing yellow mucus. Began having some slight wheezing this morning, has used her nebulizer treatment 1. Nausea but no vomiting. No diarrhea or abdominal pain. No ear pain. Taking fluids well. Voiding normal limit. Has taken 2 days of leftover amoxicillin. At the end of visit patient requested that we take over the care of her hypothyroidism and prediabetes. Has been seen a wellness Center in [place]. Has had some lab work but thinks it was over a year ago, copy not available during office visit. Is on Armour Thyroid for Hashimoto's thyroiditis/hypothyroidism. Out of her medication for 2 days.  Objective:   BP 122/90 mmHg  Temp(Src) 98.3 F (36.8 C) (Oral)  Ht 5\' 5"  (1.651 m)  Wt 261 lb (118.389 kg)  BMI 43.43 kg/m2  LMP 01/23/2014 NAD. Alert, oriented. TMs clear effusion, no erythema. Pharynx injected with PND noted. Neck supple with mild soft anterior adenopathy. Lungs clear. Heart regular rate rhythm.  Assessment:  Problem List Items Addressed This Visit      Endocrine   Subclinical hypothyroidism   Relevant Medications   thyroid (ARMOUR THYROID) 60 MG tablet   Other Relevant Orders   TSH   T4, free     Other   Hypokalemia   Relevant Orders   Hepatic function panel   Basic metabolic panel   Prediabetes   Relevant Orders   Hepatic function panel   Basic metabolic panel   Hemoglobin A1c    Other Visit Diagnoses    Rhinosinusitis    -  Primary    Relevant Medications    amoxicillin (AMOXIL) 500 MG capsule    Screening cholesterol level        Relevant Orders    Lipid panel      Plan:  Meds ordered this encounter  Medications  . DISCONTD: thyroid (ARMOUR THYROID) 60 MG tablet    Sig: Take 60 mg by mouth daily before breakfast.  . amoxicillin (AMOXIL) 500  MG capsule    Sig: Take 1 capsule (500 mg total) by mouth 3 (three) times daily.    Dispense:  30 capsule    Refill:  0    Order Specific Question:  Supervising Provider    Answer:  Mikey Kirschner [[id number]]  . albuterol (PROVENTIL HFA;VENTOLIN HFA) 108 (90 Base) MCG/ACT inhaler    Sig: Inhale 2 puffs into the lungs every 4 (four) hours as needed.    Dispense:  1 Inhaler    Refill:  0    Order Specific Question:  Supervising Provider    Answer:  Mikey Kirschner [[id number]]  . DISCONTD: albuterol (PROVENTIL) (2.5 MG/3ML) 0.083% nebulizer solution    Sig: Take 3 mLs (2.5 mg total) by nebulization every 4 (four) hours as needed for wheezing or shortness of breath.    Dispense:  25 vial    Refill:  0    Order Specific Question:  Supervising Provider    Answer:  Mikey Kirschner [[id number]]  . albuterol (PROVENTIL) (2.5 MG/3ML) 0.083% nebulizer solution    Sig: Take 3 mLs (2.5 mg total) by nebulization every 4 (four) hours as needed for wheezing or shortness of breath.    Dispense:  25 vial    Refill:  0    Order Specific Question:  Supervising Provider    Answer:  Mikey Kirschner [[id number]]  . thyroid (ARMOUR THYROID) 60 MG tablet    Sig: Take 1 tablet (60 mg total) by mouth daily before breakfast.    Dispense:  30 tablet    Refill:  2    Order Specific Question:  Supervising Provider    Answer:  Mikey Kirschner [[id number]]   Continue albuterol as directed. Routine lab work ordered. Release of records to wellness Center. At this time refill Armour Thyroid until labs come back. Call back if symptoms worsen or persist.

## 2016-06-22 ENCOUNTER — Encounter: Payer: Self-pay | Admitting: Family Medicine

## 2016-06-22 ENCOUNTER — Ambulatory Visit (INDEPENDENT_AMBULATORY_CARE_PROVIDER_SITE_OTHER): Payer: Managed Care, Other (non HMO) | Admitting: Family Medicine

## 2016-06-22 VITALS — BP 146/100 | Temp 99.1°F | Ht 65.0 in | Wt 275.0 lb

## 2016-06-22 DIAGNOSIS — R1030 Lower abdominal pain, unspecified: Secondary | ICD-10-CM

## 2016-06-22 DIAGNOSIS — N3000 Acute cystitis without hematuria: Secondary | ICD-10-CM

## 2016-06-22 DIAGNOSIS — M25559 Pain in unspecified hip: Secondary | ICD-10-CM

## 2016-06-22 LAB — POCT URINALYSIS DIPSTICK
PH UA: 5
Spec Grav, UA: 1.015

## 2016-06-22 MED ORDER — HYDROCODONE-ACETAMINOPHEN 5-325 MG PO TABS: 1.0000 | ORAL_TABLET | Freq: Four times a day (QID) | ORAL | 0 refills | Status: DC | PRN

## 2016-06-22 MED ORDER — SULFAMETHOXAZOLE-TRIMETHOPRIM 800-160 MG PO TABS: 1.0000 | ORAL_TABLET | Freq: Two times a day (BID) | ORAL | 0 refills | Status: DC

## 2016-06-22 NOTE — Progress Notes (Signed)
   Subjective:    Patient ID: Summer [name], female    DOB: [birth year], 55 y.o.   MRN: [id number]  Abdominal Pain  This is a new problem. Episode onset: 3 days. Pain location: low abd pain, worse on left side. The pain is at a severity of 5/10. Associated symptoms include a fever and nausea. The pain is aggravated by movement (bending over). The pain is relieved by being still. Treatments tried: ibuprofen. The treatment provided mild relief.   Patient went to the ER. At the ER she had lab work completed. She states she's feeling somewhat better now with some soreness in the lower pelvic region   Review of Systems  Constitutional: Positive for fever.  Gastrointestinal: Positive for abdominal pain and nausea.       Objective:   Physical Exam Lungs clear hearts regular abdomen soft with moderate tenderness in lower abdominal region more so on the left side than right side no guarding or rebound extremities no edema  Urinalysis shows some WBCs go ahead and do urine culture antibiotics ordered.     Assessment & Plan:  I recommend lab work If lab work looks good I recommend referral to gynecology because of the possibility of this being ovary related issues. I do not feel the patient needs to have any type of a scope test currently

## 2016-06-24 LAB — URINE CULTURE

## 2016-06-30 ENCOUNTER — Encounter: Payer: Self-pay | Admitting: Obstetrics and Gynecology

## 2016-06-30 ENCOUNTER — Other Ambulatory Visit (HOSPITAL_COMMUNITY)
Admission: RE | Admit: 2016-06-30 | Discharge: 2016-06-30 | Disposition: A | Discharge status: Home / Self Care | Payer: Managed Care, Other (non HMO) | Source: Ambulatory Visit | Attending: Obstetrics and Gynecology | Admitting: Obstetrics and Gynecology

## 2016-06-30 ENCOUNTER — Ambulatory Visit (INDEPENDENT_AMBULATORY_CARE_PROVIDER_SITE_OTHER): Payer: Managed Care, Other (non HMO) | Admitting: Obstetrics and Gynecology

## 2016-06-30 VITALS — BP 158/84 | HR 112 | Ht 65.0 in | Wt 275.4 lb

## 2016-06-30 DIAGNOSIS — Z1151 Encounter for screening for human papillomavirus (HPV): Secondary | ICD-10-CM | POA: Insufficient documentation

## 2016-06-30 DIAGNOSIS — Z1389 Encounter for screening for other disorder: Secondary | ICD-10-CM | POA: Diagnosis not present

## 2016-06-30 DIAGNOSIS — Z87440 Personal history of urinary (tract) infections: Secondary | ICD-10-CM | POA: Diagnosis not present

## 2016-06-30 DIAGNOSIS — Z01411 Encounter for gynecological examination (general) (routine) with abnormal findings: Secondary | ICD-10-CM

## 2016-06-30 DIAGNOSIS — Z01419 Encounter for gynecological examination (general) (routine) without abnormal findings: Secondary | ICD-10-CM | POA: Diagnosis present

## 2016-06-30 DIAGNOSIS — Z6841 Body Mass Index (BMI) 40.0 and over, adult: Secondary | ICD-10-CM | POA: Diagnosis not present

## 2016-06-30 DIAGNOSIS — Z113 Encounter for screening for infections with a predominantly sexual mode of transmission: Secondary | ICD-10-CM | POA: Diagnosis present

## 2016-06-30 LAB — POCT URINALYSIS DIPSTICK
Blood, UA: NEGATIVE
GLUCOSE UA: NEGATIVE
Ketones, UA: NEGATIVE
Leukocytes, UA: NEGATIVE
Nitrite, UA: NEGATIVE

## 2016-06-30 NOTE — Progress Notes (Signed)
Assessment:  Annual Gyn Exam Morbid obesity Nl GYN exam  Plan:  1. pap smear done, next pap due 3 years 2. return annually or prn 3    Annual mammogram advised Subjective:  Summer [name] is a 55 y.o. female No obstetric history on file. who presents for annual exam. Patient's last menstrual period was 01/23/2014. The patient has complaints today of left sided abdominal pain. Pt notes that she was seen by Dr. Wolfgang Phoenix and dx with an UTI and Rx bactrim for her symptoms. Pt reports that she has had relief of her urinary symptoms. Pt states that she has not had a mammogram in "awhile." Denies any noticeable changes with her self breast exams. Pt has not tried any medications for the relief of her symptoms. Pt denies any other symptoms.   The following portions of the patient's history were reviewed and updated as appropriate: allergies, current medications, past family history, past medical history, past social history, past surgical history and problem list. Past Medical History:  Diagnosis Date  . Arthritis   . Bell's palsy   . Dysrhythmia, cardiac   . Fibromyalgia   . Hypertension   . Thyroid disease     Past Surgical History:  Procedure Laterality Date  . COLONOSCOPY N/A 01/23/2014   Procedure: COLONOSCOPY;  Surgeon: Rogene Houston, MD;  Location: [facility];  Service: Endoscopy;  Laterality: N/A;  200  . TONSILLECTOMY       Current Outpatient Prescriptions:  .  acyclovir (ZOVIRAX) 400 MG tablet, Take 400 mg by mouth 2 (two) times daily., Disp: , Rfl:  .  albuterol (PROVENTIL HFA;VENTOLIN HFA) 108 (90 Base) MCG/ACT inhaler, Inhale 2 puffs into the lungs every 4 (four) hours as needed., Disp: 1 Inhaler, Rfl: 0 .  albuterol (PROVENTIL) (2.5 MG/3ML) 0.083% nebulizer solution, Take 3 mLs (2.5 mg total) by nebulization every 4 (four) hours as needed for wheezing or shortness of breath., Disp: 25 vial, Rfl: 0 .  Ascorbic Acid (VITAMIN C) POWD, Take 5 mLs by mouth 2 (two) times  daily., Disp: , Rfl:  .  aspirin EC 81 MG tablet, Take 81 mg by mouth daily., Disp: , Rfl:  .  Cholecalciferol (VITAMIN D-3) 5000 UNITS TABS, Take 1 tablet by mouth daily., Disp: , Rfl:  .  Folic Acid-Vit Q000111Q 123456 (HOMOCYSTEINE FORMULA) 0.8-50-0.1 MG TABS, Take 1 tablet by mouth daily., Disp: , Rfl:  .  Magnesium 200 MG TABS, Take 2 tablets by mouth 2 (two) times daily. , Disp: , Rfl:  .  Multiple Vitamins-Minerals (CLINICAL NUTRIENTS FOR WOMEN PO), Take 2 capsules by mouth 2 (two) times daily., Disp: , Rfl:  .  OLIVE LEAF PO, Take 1.5 tablets by mouth daily., Disp: , Rfl:  .  OVER THE COUNTER MEDICATION, Take 2 capsules by mouth 2 (two) times daily. Reported on 02/05/2016, Disp: , Rfl:  .  sulfamethoxazole-trimethoprim (BACTRIM DS,SEPTRA DS) 800-160 MG tablet, Take 1 tablet by mouth 2 (two) times daily., Disp: 14 tablet, Rfl: 0 .  thyroid (ARMOUR THYROID) 60 MG tablet, Take 1 tablet (60 mg total) by mouth daily before breakfast., Disp: 30 tablet, Rfl: 2 .  HYDROcodone-acetaminophen (NORCO/VICODIN) 5-325 MG tablet, Take 1 tablet by mouth every 6 (six) hours as needed. (Patient not taking: Reported on 06/30/2016), Disp: 30 tablet, Rfl: 0  Review of Systems Constitutional: negative Gastrointestinal: negative Genitourinary: negative  Objective:  BP (!) 158/84   Pulse (!) 112   Ht 5\' 5"  (1.651 m)   Wt  275 lb 6.4 oz (124.9 kg)   LMP 01/23/2014 Comment: spotting  BMI 45.83 kg/m    BMI: Body mass index is 45.83 kg/m.  General Appearance: Exam limited by body habitus. Alert, appropriate appearance for age. No acute distress HEENT: Grossly normal Neck / Thyroid:  Cardiovascular: RRR; normal S1, S2, no murmur Lungs: CTA bilaterally Back: No CVAT Breast Exam: No masses or nodes.No dimpling, nipple retraction or discharge. Gastrointestinal: Soft, non-tender, no masses or organomegaly Pelvic Exam: Vulva and vagina appear normal. Bimanual exam reveals normal uterus and adnexa. Vaginal: normal  mucosa without prolapse or lesions, normal without tenderness, induration or masses and normal rugae Cervix: normal appearance and no masses Adnexa: normal bimanual exam and no masses Uterus: normal single, nontender Rectal: good sphincter tone, no masses and guaiac negative Rectovaginal: not indicated Lymphatic Exam: Non-palpable nodes in neck, clavicular, axillary, or inguinal regions Skin: no rash or abnormalities Neurologic: Normal gait and speech, no tremor  Psychiatric: Alert and oriented, appropriate affect.  Urinalysis:trace of protein otherwise negative    Mallory Shirk. MD Pgr [phone] 3:41 PM   By signing my name below, I, Soijett Blue, attest that this documentation has been prepared under the direction and in the presence of Jonnie Kind, MD. Electronically Signed: Soijett Blue, ED Scribe. 06/30/16. 3:41 PM.  I personally performed the services described in this documentation, which was SCRIBED in my presence. The recorded information has been reviewed and considered accurate. It has been edited as necessary during review. Jonnie Kind, MD

## 2016-07-02 LAB — CYTOLOGY - PAP

## 2016-08-27 ENCOUNTER — Emergency Department (HOSPITAL_COMMUNITY): Payer: Managed Care, Other (non HMO)

## 2016-08-27 ENCOUNTER — Encounter (HOSPITAL_COMMUNITY): Payer: Self-pay | Admitting: Emergency Medicine

## 2016-08-27 DIAGNOSIS — M7989 Other specified soft tissue disorders: Secondary | ICD-10-CM | POA: Insufficient documentation

## 2016-08-27 DIAGNOSIS — R079 Chest pain, unspecified: Secondary | ICD-10-CM | POA: Diagnosis not present

## 2016-08-27 DIAGNOSIS — I10 Essential (primary) hypertension: Secondary | ICD-10-CM | POA: Insufficient documentation

## 2016-08-27 DIAGNOSIS — E039 Hypothyroidism, unspecified: Secondary | ICD-10-CM | POA: Insufficient documentation

## 2016-08-27 DIAGNOSIS — Z79899 Other long term (current) drug therapy: Secondary | ICD-10-CM | POA: Diagnosis not present

## 2016-08-27 DIAGNOSIS — Z7982 Long term (current) use of aspirin: Secondary | ICD-10-CM | POA: Insufficient documentation

## 2016-08-27 DIAGNOSIS — Z87891 Personal history of nicotine dependence: Secondary | ICD-10-CM | POA: Diagnosis not present

## 2016-08-27 LAB — URINALYSIS, ROUTINE W REFLEX MICROSCOPIC
Glucose, UA: NEGATIVE mg/dL
Ketones, ur: 40 mg/dL — AB
Nitrite: NEGATIVE
Protein, ur: NEGATIVE mg/dL
Specific Gravity, Urine: 1.027 (ref 1.005–1.030)
pH: 6 (ref 5.0–8.0)

## 2016-08-27 LAB — I-STAT TROPONIN, ED: Troponin i, poc: 0 ng/mL (ref 0.00–0.08)

## 2016-08-27 LAB — CBC
HEMATOCRIT: 46.9 % — AB (ref 36.0–46.0)
HEMOGLOBIN: 16.2 g/dL — AB (ref 12.0–15.0)
MCH: 30.4 pg (ref 26.0–34.0)
MCHC: 34.5 g/dL (ref 30.0–36.0)
MCV: 88 fL (ref 78.0–100.0)
Platelets: 278 10*3/uL (ref 150–400)
RBC: 5.33 MIL/uL — AB (ref 3.87–5.11)
RDW: 15.3 % (ref 11.5–15.5)
WBC: 9.1 10*3/uL (ref 4.0–10.5)

## 2016-08-27 LAB — URINE MICROSCOPIC-ADD ON
RBC / HPF: NONE SEEN RBC/hpf (ref 0–5)
WBC, UA: NONE SEEN WBC/hpf (ref 0–5)

## 2016-08-27 LAB — BASIC METABOLIC PANEL WITH GFR
Anion gap: 10 (ref 5–15)
BUN: 21 mg/dL — ABNORMAL HIGH (ref 6–20)
CO2: 22 mmol/L (ref 22–32)
Calcium: 9.5 mg/dL (ref 8.9–10.3)
Chloride: 109 mmol/L (ref 101–111)
Creatinine, Ser: 0.91 mg/dL (ref 0.44–1.00)
GFR calc Af Amer: >60 mL/min
GFR calc non Af Amer: >60 mL/min
Glucose, Bld: 113 mg/dL — ABNORMAL HIGH (ref 65–99)
Potassium: 3.6 mmol/L (ref 3.5–5.1)
Sodium: 141 mmol/L (ref 135–145)

## 2016-08-27 NOTE — ED Notes (Signed)
RN Mortimer Fries informed

## 2016-08-27 NOTE — ED Triage Notes (Addendum)
Patient reports central chest pain with SOB and nausea onset this week , denies fever or diaphoresis . Pt. added dysuria this week .

## 2016-08-28 ENCOUNTER — Emergency Department (HOSPITAL_COMMUNITY)
Admission: EM | Admit: 2016-08-28 | Discharge: 2016-08-28 | Disposition: A | Discharge status: Home / Self Care | Payer: Managed Care, Other (non HMO) | Attending: Emergency Medicine | Admitting: Emergency Medicine

## 2016-08-28 ENCOUNTER — Emergency Department (HOSPITAL_COMMUNITY): Payer: Managed Care, Other (non HMO)

## 2016-08-28 ENCOUNTER — Emergency Department (HOSPITAL_BASED_OUTPATIENT_CLINIC_OR_DEPARTMENT_OTHER)
Admit: 2016-08-28 | Discharge: 2016-08-28 | Disposition: A | Discharge status: Home / Self Care | Payer: Managed Care, Other (non HMO) | Attending: Emergency Medicine | Admitting: Emergency Medicine

## 2016-08-28 DIAGNOSIS — R079 Chest pain, unspecified: Secondary | ICD-10-CM

## 2016-08-28 DIAGNOSIS — M7989 Other specified soft tissue disorders: Secondary | ICD-10-CM

## 2016-08-28 DIAGNOSIS — M79609 Pain in unspecified limb: Secondary | ICD-10-CM | POA: Diagnosis not present

## 2016-08-28 LAB — CBG MONITORING, ED: Glucose-Capillary: 118 mg/dL — ABNORMAL HIGH (ref 65–99)

## 2016-08-28 LAB — TROPONIN I

## 2016-08-28 LAB — D-DIMER, QUANTITATIVE (NOT AT ARMC): D DIMER QUANT: 4.59 ug{FEU}/mL — AB (ref 0.00–0.50)

## 2016-08-28 MED ORDER — IBUPROFEN 600 MG PO TABS: 600.0000 mg | ORAL_TABLET | Freq: Four times a day (QID) | ORAL | 0 refills | Status: DC | PRN

## 2016-08-28 MED ORDER — TECHNETIUM TO 99M ALBUMIN AGGREGATED
4.2000 | Freq: Once | INTRAVENOUS | Status: AC | PRN
  Administered 2016-08-28: 4.2 via INTRAVENOUS

## 2016-08-28 MED ORDER — IOPAMIDOL (ISOVUE-370) INJECTION 76%
INTRAVENOUS | Status: AC
  Administered 2016-08-28: 100 mL
  Filled 2016-08-28: qty 100

## 2016-08-28 MED ORDER — TECHNETIUM TC 99M DIETHYLENETRIAME-PENTAACETIC ACID: 31.1000 | Freq: Once | INTRAVENOUS | Status: DC | PRN

## 2016-08-28 MED ORDER — HYDROCODONE-ACETAMINOPHEN 5-325 MG PO TABS: 1.0000 | ORAL_TABLET | Freq: Four times a day (QID) | ORAL | 0 refills | Status: DC | PRN

## 2016-08-28 NOTE — Discharge Instructions (Signed)
Read the information below.  Use the prescribed medication as directed.  Please discuss all new medications with your pharmacist.  Do not take additional tylenol while taking the prescribed pain medication to avoid overdose.  You may return to the Emergency Department at any time for worsening condition or any new symptoms that concern you.    If you develop worsening chest pain, shortness of breath, fever, you pass out, or become weak or dizzy, return to the ER for a recheck.    °

## 2016-08-28 NOTE — ED Notes (Signed)
Declined W/C at D/C and was escorted to lobby by RN. 

## 2016-08-28 NOTE — ED Notes (Signed)
Pt returned from CT °

## 2016-08-28 NOTE — Progress Notes (Signed)
VASCULAR LAB PRELIMINARY  PRELIMINARY  PRELIMINARY  PRELIMINARY  Bilateral lower extremity venous duplex completed.    Preliminary report:  Bilateral study completed due to probable PE per CT scan. Awaiting VQ scan.  Bilateral:  No evidence of DVT, superficial thrombosis, or Baker's Cyst.   Chaney Ingram, RVS 08/28/2016, 8:17 AM

## 2016-08-28 NOTE — ED Provider Notes (Signed)
[facility] DEPT Provider Note   CSN: [id number] Arrival date & time: 08/27/16  2153  History   Chief Complaint Chief Complaint  Patient presents with  . Chest Pain    HPI Summer [name] is a 55 y.o. female.  HPI  Pt with PMH of arthritis, bell's palsy, dysrhythmia, fibromyalgia, hypertension, thyroid disease comes to the ER with concerns of SOB, central CP, with nausea that started this past week. She has also been experiencing some swelling to her right lower extremity. She denies knowing hx of blood clots or cardiac disease. She currently is having the symptoms but there are mild and she declines pain medications at this time. Normal vital signs  Past Medical History:  Diagnosis Date  . Arthritis   . Bell's palsy   . Dysrhythmia, cardiac   . Fibromyalgia   . Hypertension   . Thyroid disease     Patient Active Problem List   Diagnosis Date Noted  . Prediabetes 06/10/2014  . Subclinical hypothyroidism 06/10/2014  . Abdominal pain, other specified site 12/26/2013  . Esophageal reflux 04/01/2013  . Fibromyalgia 03/22/2013  . Hypokalemia 03/22/2013  . Facial paralysis/Bells palsy 02/01/2013    Past Surgical History:  Procedure Laterality Date  . COLONOSCOPY N/A 01/23/2014   Procedure: COLONOSCOPY;  Surgeon: Rogene Houston, MD;  Location: [facility];  Service: Endoscopy;  Laterality: N/A;  200  . TONSILLECTOMY      OB History    No data available       Home Medications    Prior to Admission medications   Medication Sig Start Date End Date Taking? Authorizing Provider  acyclovir (ZOVIRAX) 400 MG tablet Take 400 mg by mouth 2 (two) times daily.   Yes Historical Provider, MD  albuterol (PROVENTIL HFA;VENTOLIN HFA) 108 (90 Base) MCG/ACT inhaler Inhale 2 puffs into the lungs every 4 (four) hours as needed. Patient taking differently: Inhale 2 puffs into the lungs every 4 (four) hours as needed for shortness of breath.  02/05/16  Yes Nilda Simmer, NP    albuterol (PROVENTIL) (2.5 MG/3ML) 0.083% nebulizer solution Take 3 mLs (2.5 mg total) by nebulization every 4 (four) hours as needed for wheezing or shortness of breath. 02/05/16  Yes Nilda Simmer, NP  Ascorbic Acid (VITAMIN C) POWD Take 5 mLs by mouth 2 (two) times daily.   Yes Historical Provider, MD  aspirin EC 81 MG tablet Take 81 mg by mouth daily.   Yes Historical Provider, MD  Cholecalciferol (VITAMIN D-3) 5000 UNITS TABS Take 1 tablet by mouth daily.   Yes Historical Provider, MD  Folic Acid-Vit Q000111Q 123456 (HOMOCYSTEINE FORMULA) 0.8-50-0.1 MG TABS Take 1 tablet by mouth daily.   Yes Historical Provider, MD  Magnesium 200 MG TABS Take 2 tablets by mouth 2 (two) times daily.    Yes Historical Provider, MD  Multiple Vitamins-Minerals (CLINICAL NUTRIENTS FOR WOMEN PO) Take 2 capsules by mouth 2 (two) times daily.   Yes Historical Provider, MD  OLIVE LEAF PO Take 1.5 tablets by mouth daily.   Yes Historical Provider, MD  thyroid (ARMOUR THYROID) 60 MG tablet Take 1 tablet (60 mg total) by mouth daily before breakfast. 02/05/16  Yes Nilda Simmer, NP  HYDROcodone-acetaminophen (NORCO/VICODIN) 5-325 MG tablet Take 1 tablet by mouth every 6 (six) hours as needed. Patient not taking: Reported on 08/27/2016 06/22/16   Kathyrn Drown, MD  OVER THE COUNTER MEDICATION Take 2 capsules by mouth 2 (two) times daily. Reported on 02/05/2016  Historical Provider, MD  sulfamethoxazole-trimethoprim (BACTRIM DS,SEPTRA DS) 800-160 MG tablet Take 1 tablet by mouth 2 (two) times daily. Patient not taking: Reported on 08/27/2016 06/22/16   Kathyrn Drown, MD    Family History Family History  Problem Relation Age of Onset  . Adopted: Yes  . Cancer Mother     lung    Social History Social History  Substance Use Topics  . Smoking status: Former Smoker    Types: Cigarettes  . Smokeless tobacco: Never Used     Comment: quit smoking 10 yrs   . Alcohol use No     Allergies   Avelox [moxifloxacin hcl  in nacl]; Bee venom; Doxycycline; and Shellfish allergy   Review of Systems Review of Systems Review of Systems All other systems negative except as documented in the HPI. All pertinent positives and negatives as reviewed in the HPI.   Physical Exam Updated Vital Signs BP 123/80   Pulse 94   Temp 98.6 F (37 C) (Oral)   Resp 16   Ht 5\' 5"  (1.651 m)   Wt 124.7 kg   LMP 01/23/2014 Comment: spotting  SpO2 95%   BMI 45.76 kg/m   Physical Exam  Constitutional: She appears well-developed and well-nourished.  HENT:  Head: Normocephalic and atraumatic.  Eyes: Conjunctivae are normal. Pupils are equal, round, and reactive to light.  Neck: Trachea normal, normal range of motion and full passive range of motion without pain. Neck supple.  Cardiovascular: Normal rate, regular rhythm and normal pulses.   Pulmonary/Chest: Effort normal and breath sounds normal. Chest wall is not dull to percussion. She exhibits no tenderness, no crepitus, no edema, no deformity and no retraction.  Abdominal: Soft. Normal appearance and bowel sounds are normal.  Musculoskeletal: Normal range of motion.  Right lower extremity swelling Symmetrical pedal pulse  Neurological: She is alert. She has normal strength.  Skin: Skin is warm, dry and intact.  Psychiatric: She has a normal mood and affect. Her speech is normal and behavior is normal. Judgment and thought content normal. Cognition and memory are normal.     ED Treatments / Results  Labs (all labs ordered are listed, but only abnormal results are displayed) Labs Reviewed  BASIC METABOLIC PANEL - Abnormal; Notable for the following:       Result Value   Glucose, Bld 113 (*)    BUN 21 (*)    All other components within normal limits  CBC - Abnormal; Notable for the following:    RBC 5.33 (*)    Hemoglobin 16.2 (*)    HCT 46.9 (*)    All other components within normal limits  URINALYSIS, ROUTINE W REFLEX MICROSCOPIC (NOT AT [facility]) - Abnormal;  Notable for the following:    Hgb urine dipstick SMALL (*)    Bilirubin Urine SMALL (*)    Ketones, ur 40 (*)    Leukocytes, UA TRACE (*)    All other components within normal limits  URINE MICROSCOPIC-ADD ON - Abnormal; Notable for the following:    Squamous Epithelial / LPF 0-5 (*)    Bacteria, UA FEW (*)    All other components within normal limits  D-DIMER, QUANTITATIVE (NOT AT [facility]) - Abnormal; Notable for the following:    D-Dimer, Quant 4.59 (*)    All other components within normal limits  CBG MONITORING, ED - Abnormal; Notable for the following:    Glucose-Capillary 118 (*)    All other components within normal limits  TROPONIN I  Randolm Idol, ED    EKG  EKG Interpretation None       Radiology Dg Chest 2 View  Result Date: 08/27/2016 CLINICAL DATA:  Chest pain and dyspnea for 1 week. Ex smoker. Hypertension. EXAM: CHEST  2 VIEW COMPARISON:  None. FINDINGS: The heart size and mediastinal contours are within normal limits. Both lungs are clear. The visualized skeletal structures are unremarkable. IMPRESSION: No active cardiopulmonary disease. Electronically Signed   By: Ashley Royalty M.D.   On: 08/27/2016 22:52    Procedures Procedures (including critical care time)  Medications Ordered in ED Medications  iopamidol (ISOVUE-370) 76 % injection (100 mLs  Contrast Given 08/28/16 0344)     Initial Impression / Assessment and Plan / ED Course  I have reviewed the triage vital signs and the nursing notes.  Pertinent labs & imaging results that were available during my care of the patient were reviewed by me and considered in my medical decision making (see chart for details).  Clinical Course    Pts CT angio of the chest was inconclusive for PE. She does have unilateral LE swelling, CP, SOb and was tachyardic on arrival. Radiologist recommends VQ scan. At end of shift, patient sign out to [facility], [employer]. She will need to have VQ scan ordered around 7am in the  morning. Pt updated and aware of plan.  Final Clinical Impressions(s) / ED Diagnoses   Final diagnoses:  None    New Prescriptions New Prescriptions   No medications on file     Delos Haring, PA-C 08/28/16 [id number]    Orpah Greek, MD 08/29/16 [phone]

## 2016-08-28 NOTE — ED Notes (Signed)
Patient transported to CT 

## 2016-08-28 NOTE — ED Notes (Signed)
nad noted, abc intact, will continue to monitor, vq scan to be performed after 7 am

## 2016-08-28 NOTE — ED Triage Notes (Signed)
PT absent from room unable to assess.

## 2016-08-28 NOTE — ED Provider Notes (Signed)
6:25 AM Patient signed out to me at change of shift from Delos Haring, PA-C.  Patient with CP, SOB, right leg swelling.  Equivocal CT angio chest result.  Pending VQ scan, RLE venous duplex US.    8:21 AM Bilateral lower extremity dopplers are negative for DVT.   10:03 AM Discussed results with patient and family member.  Pt reports waxing and waning but constantly present pain in her chest x 4 days.  VQ scan is negative.  Plan for d/c home with PCP follow up Monday morning (Dr Sallee Lange), and cardiology referral, pain medication for home.    Discussed result, findings, treatment, and follow up  with patient.  Pt given return precautions.  Pt verbalizes understanding and agrees with plan.       Clayton Bibles, PA-C 08/28/16 [place], MD 08/29/16 [phone]

## 2016-09-10 ENCOUNTER — Encounter: Payer: Self-pay | Admitting: Nurse Practitioner

## 2016-09-10 ENCOUNTER — Ambulatory Visit (INDEPENDENT_AMBULATORY_CARE_PROVIDER_SITE_OTHER): Payer: Managed Care, Other (non HMO) | Admitting: Nurse Practitioner

## 2016-09-10 VITALS — BP 136/84 | Temp 98.0°F | Ht 65.0 in | Wt 278.6 lb

## 2016-09-10 DIAGNOSIS — R062 Wheezing: Secondary | ICD-10-CM | POA: Diagnosis not present

## 2016-09-10 DIAGNOSIS — J329 Chronic sinusitis, unspecified: Secondary | ICD-10-CM

## 2016-09-10 MED ORDER — AMOXICILLIN-POT CLAVULANATE 875-125 MG PO TABS: 1.0000 | ORAL_TABLET | Freq: Two times a day (BID) | ORAL | 0 refills | Status: DC

## 2016-09-10 MED ORDER — PREDNISONE 20 MG PO TABS: ORAL_TABLET | ORAL | 0 refills | Status: DC

## 2016-09-10 MED ORDER — ALBUTEROL SULFATE HFA 108 (90 BASE) MCG/ACT IN AERS: 2.0000 | INHALATION_SPRAY | RESPIRATORY_TRACT | 0 refills | Status: DC | PRN

## 2016-09-10 MED ORDER — ALBUTEROL SULFATE (2.5 MG/3ML) 0.083% IN NEBU: 2.5000 mg | INHALATION_SOLUTION | RESPIRATORY_TRACT | 0 refills | Status: DC | PRN

## 2016-09-10 NOTE — Patient Instructions (Signed)
Omeprazole as directed. 

## 2016-09-11 ENCOUNTER — Encounter: Payer: Self-pay | Admitting: Nurse Practitioner

## 2016-09-11 NOTE — Progress Notes (Signed)
Subjective:  Presents for c/o cough and wheezing that began 6 days ago. Fever and sore throat have resolved. Facial area headache. Runny nose. Frequent non productive cough. Using albuterol nebs which helps breathing. Slight loose stools. Took some Omnicef x 3 d with minimal improvement. No vomiting or abd pain. Taking fluids well. Voiding nl.   Objective:   BP 136/84   Temp 98 F (36.7 C) (Oral)   Ht 5\' 5"  (1.651 m)   Wt 278 lb 9.6 oz (126.4 kg)   LMP 01/23/2014 Comment: spotting  BMI 46.36 kg/m  NAD. Alert, oriented. TMs clear effusion. Pharynx injected with PND noted. Neck supple with mild anterior adenopathy. Lungs clear. Heart RRR.   Assessment: Rhinosinusitis  Wheezing  Plan:  Meds ordered this encounter  Medications  . amoxicillin-clavulanate (AUGMENTIN) 875-125 MG tablet    Sig: Take 1 tablet by mouth 2 (two) times daily.    Dispense:  20 tablet    Refill:  0    Order Specific Question:   Supervising Provider    Answer:   Mikey Kirschner [[id number]]  . predniSONE (DELTASONE) 20 MG tablet    Sig: 3 po qd x 3 d then 2 po qd x 3 d then 1 po qd x 3 d    Dispense:  18 tablet    Refill:  0    Order Specific Question:   Supervising Provider    Answer:   Mikey Kirschner [[id number]]  . albuterol (PROVENTIL HFA;VENTOLIN HFA) 108 (90 Base) MCG/ACT inhaler    Sig: Inhale 2 puffs into the lungs every 4 (four) hours as needed.    Dispense:  1 Inhaler    Refill:  0    Order Specific Question:   Supervising Provider    Answer:   Mikey Kirschner [[id number]]  . albuterol (PROVENTIL) (2.5 MG/3ML) 0.083% nebulizer solution    Sig: Take 3 mLs (2.5 mg total) by nebulization every 4 (four) hours as needed for wheezing or shortness of breath.    Dispense:  25 vial    Refill:  0    Order Specific Question:   Supervising Provider    Answer:   Mikey Kirschner [[id number]]   OTC meds as directed for congestion and cough. Call back in 7-10 days if no improvement, sooner if worse. Continue albuterol as  directed. Marland Kitchen

## 2016-10-07 ENCOUNTER — Ambulatory Visit (INDEPENDENT_AMBULATORY_CARE_PROVIDER_SITE_OTHER): Payer: Managed Care, Other (non HMO) | Admitting: Nurse Practitioner

## 2016-10-07 ENCOUNTER — Other Ambulatory Visit: Payer: Self-pay | Admitting: Nurse Practitioner

## 2016-10-07 ENCOUNTER — Encounter: Payer: Self-pay | Admitting: Nurse Practitioner

## 2016-10-07 VITALS — BP 128/82 | Ht 65.0 in | Wt 277.4 lb

## 2016-10-07 DIAGNOSIS — R7303 Prediabetes: Secondary | ICD-10-CM

## 2016-10-07 DIAGNOSIS — E876 Hypokalemia: Secondary | ICD-10-CM

## 2016-10-07 DIAGNOSIS — E038 Other specified hypothyroidism: Secondary | ICD-10-CM

## 2016-10-07 DIAGNOSIS — K219 Gastro-esophageal reflux disease without esophagitis: Secondary | ICD-10-CM

## 2016-10-07 DIAGNOSIS — E039 Hypothyroidism, unspecified: Secondary | ICD-10-CM

## 2016-10-07 DIAGNOSIS — R002 Palpitations: Secondary | ICD-10-CM

## 2016-10-07 DIAGNOSIS — Z13220 Encounter for screening for lipoid disorders: Secondary | ICD-10-CM

## 2016-10-07 MED ORDER — RANITIDINE HCL 300 MG PO TABS: 300.0000 mg | ORAL_TABLET | Freq: Every day | ORAL | 5 refills | Status: DC

## 2016-10-07 MED ORDER — ALPRAZOLAM 0.5 MG PO TABS: ORAL_TABLET | ORAL | 0 refills | Status: DC

## 2016-10-07 NOTE — Patient Instructions (Signed)
Food Choices for Gastroesophageal Reflux Disease, Adult When you have gastroesophageal reflux disease (GERD), the foods you eat and your eating habits are very important. Choosing the right foods can help ease the discomfort of GERD. What general guidelines do I need to follow?  Choose fruits, vegetables, whole grains, low-fat dairy products, and low-fat meat, fish, and poultry.  Limit fats such as oils, salad dressings, butter, nuts, and avocado.  Keep a food diary to identify foods that cause symptoms.  Avoid foods that cause reflux. These may be different for different people.  Eat frequent small meals instead of three large meals each day.  Eat your meals slowly, in a relaxed setting.  Limit fried foods.  Cook foods using methods other than frying.  Avoid drinking alcohol.  Avoid drinking large amounts of liquids with your meals.  Avoid bending over or lying down until 2-3 hours after eating. What foods are not recommended? The following are some foods and drinks that may worsen your symptoms: Vegetables  Tomatoes. Tomato juice. Tomato and spaghetti sauce. Chili peppers. Onion and garlic. Horseradish. Fruits  Oranges, grapefruit, and lemon (fruit and juice). Meats  High-fat meats, fish, and poultry. This includes hot dogs, ribs, ham, sausage, salami, and bacon. Dairy  Whole milk and chocolate milk. Sour cream. Cream. Butter. Ice cream. Cream cheese. Beverages  Coffee and tea, with or without caffeine. Carbonated beverages or energy drinks. Condiments  Hot sauce. Barbecue sauce. Sweets/Desserts  Chocolate and cocoa. Donuts. Peppermint and spearmint. Fats and Oils  High-fat foods, including [place] fries and potato chips. Other  Vinegar. Strong spices, such as black pepper, white pepper, red pepper, cayenne, curry powder, cloves, ginger, and chili powder. The items listed above may not be a complete list of foods and beverages to avoid. Contact your dietitian for more  information.  This information is not intended to replace advice given to you by your health care provider. Make sure you discuss any questions you have with your health care provider. Document Released: 10/18/2005 Document Revised: 03/25/2016 Document Reviewed: 08/22/2013 Elsevier Interactive Patient Education  2017 [employer].

## 2016-10-08 LAB — HEPATIC FUNCTION PANEL
ALK PHOS: 85 U/L (ref 33–130)
ALT: 26 U/L (ref 6–29)
AST: 32 U/L (ref 10–35)
Albumin: 4.2 g/dL (ref 3.6–5.1)
BILIRUBIN DIRECT: 0.2 mg/dL (ref <=0.2)
BILIRUBIN TOTAL: 0.8 mg/dL (ref 0.2–1.2)
Indirect Bilirubin: 0.6 mg/dL (ref 0.2–1.2)
Total Protein: 7 g/dL (ref 6.1–8.1)

## 2016-10-08 LAB — T4, FREE: Free T4: 1.3 ng/dL (ref 0.8–1.8)

## 2016-10-08 LAB — BASIC METABOLIC PANEL
BUN: 17 mg/dL (ref 7–25)
CALCIUM: 9.4 mg/dL (ref 8.6–10.4)
CO2: 23 mmol/L (ref 20–31)
CREATININE: 0.6 mg/dL (ref 0.50–1.05)
Chloride: 104 mmol/L (ref 98–110)
GLUCOSE: 91 mg/dL (ref 65–99)
Potassium: 4.3 mmol/L (ref 3.5–5.3)
SODIUM: 141 mmol/L (ref 135–146)

## 2016-10-08 LAB — HEMOGLOBIN A1C
Hgb A1c MFr Bld: 5.5 % (ref <5.7)
MEAN PLASMA GLUCOSE: 111 mg/dL

## 2016-10-08 LAB — LIPID PANEL
CHOL/HDL RATIO: 3 ratio (ref <5.0)
CHOLESTEROL: 163 mg/dL (ref <200)
HDL: 54 mg/dL (ref >50)
LDL Cholesterol: 87 mg/dL (ref <100)
Triglycerides: 112 mg/dL (ref <150)
VLDL: 22 mg/dL (ref <30)

## 2016-10-08 LAB — TSH: TSH: 2.59 m[IU]/L

## 2016-10-09 ENCOUNTER — Encounter: Payer: Self-pay | Admitting: Nurse Practitioner

## 2016-10-09 ENCOUNTER — Telehealth: Payer: Self-pay | Admitting: Nurse Practitioner

## 2016-10-09 DIAGNOSIS — R002 Palpitations: Secondary | ICD-10-CM | POA: Insufficient documentation

## 2016-10-09 NOTE — Telephone Encounter (Signed)
I reviewed her [facility] records after her visit. I recommend starting Toprol a medicine that should help palpitations. This was noted in her records. Let me know if she agrees and I will send in. Will still go ahead and send in cardiology referral. If this med works, we can always cancel. Thanks.

## 2016-10-09 NOTE — Progress Notes (Signed)
Subjective:  Presents for c/o feelings of her heart racing and a feeling of "vibration". Has been occurring daily for 3-4 d. Can last up to 1-2 hours. Very sporadic before this. Worse with exertion at times but can occur at rest. Slight SOB. No syncope. Denies use of caffeine or stimulants. Seen by cardiology for work up at [facility] in 2016. See chart review.  Mild edema at times although more in the right ankle. Nothing seems to alleviate the palpitations. Has been having some heartburn and reflux. Does not take any medication. Some mid abdominal pain at times. Former smoker. [facility] her biological mother, an uncle and an aunt have a history of heart disease. Is also due for her routine lab work including thyroid test. Has been under extreme stress.  Objective:   BP 128/82   Ht 5\' 5"  (1.651 m)   Wt 277 lb 6.4 oz (125.8 kg)   LMP 01/23/2014 Comment: spotting  BMI 46.16 kg/m  NAD. Alert, oriented. Lungs clear. Heart regular rate rhythm. No murmur or gallop noted. Carotids no bruits or thrills. Rate 96 and regular. Thyroid no masses or goiter, nontender to palpation. Lower extremities no edema. Abdomen soft nondistended nontender.  Assessment:  Problem List Items Addressed This Visit      Digestive   Esophageal reflux   Relevant Medications   ranitidine (ZANTAC) 300 MG tablet     Endocrine   Subclinical hypothyroidism   Relevant Orders   T4, free   TSH     Other   Hypokalemia   Relevant Orders   Basic metabolic panel   Hepatic function panel   Palpitations - Primary   Relevant Orders   T4, free   TSH   Hepatic function panel   Prediabetes   Relevant Orders   Hemoglobin A1c   Hepatic function panel    Other Visit Diagnoses    Screening cholesterol level       Relevant Orders   Lipid panel     Plan:  Meds ordered this encounter  Medications  . ALPRAZolam (XANAX) 0.5 MG tablet    Sig: 1/2 - 1 po BID prn anxiety    Dispense:  30 tablet    Refill:  0     Order Specific Question:   Supervising Provider    Answer:   Mikey Kirschner [[id number]]  . ranitidine (ZANTAC) 300 MG tablet    Sig: Take 1 tablet (300 mg total) by mouth at bedtime. For acid reflux    Dispense:  30 tablet    Refill:  5    Order Specific Question:   Supervising Provider    Answer:   Mikey Kirschner [[id number]]   Trial of low-dose Xanax when palpitations occur. Discussed importance of stress reduction. Warning signs reviewed. Reviewed lifestyle measures affecting her reflux symptoms. Will refer to cardiology for recheck. Call or go to ED sooner if worse. After reviewing her records from [facility] after visit, will have nurse contact patient; recommend low dose Toprol if she agrees.

## 2016-10-11 NOTE — Telephone Encounter (Signed)
Pt does want to stop med. She wants sent to [facility] in [place] and a call back after it was sent so she knows. Pt aware carolyn out of office until Wednesday.

## 2016-10-11 NOTE — Telephone Encounter (Signed)
Left message to return call 

## 2016-10-12 ENCOUNTER — Encounter: Payer: Self-pay | Admitting: Family Medicine

## 2016-10-13 ENCOUNTER — Other Ambulatory Visit: Payer: Self-pay | Admitting: Nurse Practitioner

## 2016-10-13 MED ORDER — METOPROLOL SUCCINATE ER 25 MG PO TB24: 25.0000 mg | ORAL_TABLET | Freq: Every day | ORAL | 2 refills | Status: DC

## 2016-10-13 NOTE — Telephone Encounter (Signed)
Low dose Toprol sent to [facility].

## 2016-10-13 NOTE — Telephone Encounter (Signed)
Pt notified on voicemail  °

## 2016-10-21 ENCOUNTER — Telehealth: Payer: Self-pay | Admitting: Pediatrics

## 2016-10-21 NOTE — Telephone Encounter (Signed)
I spoke with patient in regards to her lab results.  She asked that I let you know her right ankle and foot are swelling, as well as, left ankle again.  She states her appt with cardiologist is in mid Jan.

## 2016-10-21 NOTE — Telephone Encounter (Signed)
Is the metoprolol helping with palpitations? It can cause some mild edema. A low dose fluid pill may help until evaluation by cardiology. Any unusual cough or SOB? Is she using the same number of pillows? Trouble breathing at night?

## 2016-10-22 ENCOUNTER — Other Ambulatory Visit: Payer: Self-pay | Admitting: Nurse Practitioner

## 2016-10-22 MED ORDER — HYDROCHLOROTHIAZIDE 25 MG PO TABS: 25.0000 mg | ORAL_TABLET | Freq: Every day | ORAL | 2 refills | Status: DC

## 2016-10-22 NOTE — Telephone Encounter (Signed)
Patient notified

## 2016-10-22 NOTE — Telephone Encounter (Signed)
Patient stated she has not started the metoprolol yet-she plans on getting this this weekend after her husband gets paid. Patient not having any unusual cough or SOB

## 2016-10-22 NOTE — Telephone Encounter (Signed)
Sent in a mild fluid pill. Call back if no better.

## 2016-11-16 ENCOUNTER — Ambulatory Visit: Payer: Managed Care, Other (non HMO) | Admitting: Cardiovascular Disease

## 2016-12-10 ENCOUNTER — Ambulatory Visit (INDEPENDENT_AMBULATORY_CARE_PROVIDER_SITE_OTHER): Payer: Managed Care, Other (non HMO) | Admitting: Family Medicine

## 2016-12-10 ENCOUNTER — Encounter: Payer: Self-pay | Admitting: Family Medicine

## 2016-12-10 ENCOUNTER — Ambulatory Visit: Payer: Managed Care, Other (non HMO) | Admitting: Cardiovascular Disease

## 2016-12-10 VITALS — BP 110/84 | Temp 98.5°F | Ht 65.0 in | Wt 275.4 lb

## 2016-12-10 DIAGNOSIS — J209 Acute bronchitis, unspecified: Secondary | ICD-10-CM | POA: Diagnosis not present

## 2016-12-10 DIAGNOSIS — R071 Chest pain on breathing: Secondary | ICD-10-CM | POA: Diagnosis not present

## 2016-12-10 DIAGNOSIS — J019 Acute sinusitis, unspecified: Secondary | ICD-10-CM | POA: Diagnosis not present

## 2016-12-10 DIAGNOSIS — E039 Hypothyroidism, unspecified: Secondary | ICD-10-CM | POA: Diagnosis not present

## 2016-12-10 DIAGNOSIS — R0789 Other chest pain: Secondary | ICD-10-CM

## 2016-12-10 MED ORDER — HYDROCODONE-ACETAMINOPHEN 5-325 MG PO TABS: 1.0000 | ORAL_TABLET | Freq: Four times a day (QID) | ORAL | 0 refills | Status: DC | PRN

## 2016-12-10 MED ORDER — THYROID 60 MG PO TABS: 60.0000 mg | ORAL_TABLET | Freq: Every day | ORAL | 6 refills | Status: DC

## 2016-12-10 MED ORDER — CLARITHROMYCIN 500 MG PO TABS: 500.0000 mg | ORAL_TABLET | Freq: Two times a day (BID) | ORAL | 0 refills | Status: DC

## 2016-12-10 MED ORDER — PREDNISONE 20 MG PO TABS: ORAL_TABLET | ORAL | 0 refills | Status: DC

## 2016-12-10 NOTE — Progress Notes (Signed)
   Subjective:    Patient ID: Summer [name], female    DOB: [birth year], 56 y.o.   MRN: [id number]  HPI Patient is here today for chest pains, rib pain, shortness of breath, cough, congestion and low grade fever. Onset 4 1/2 weeks ago. Patient was seen at the emergency room twice for these symptoms. Patient was diagnosed with the flu but she believes she has a secondary illness lingering.  Patient relates a couple different weeks of head congestion chest congestion coughing denies high fever does relate some low-grade intermittent fevers. His been to the ER couple different times had x-rays EKG lab work. Was told to follow-up with [place]. Patient not respiratory distress. Review of Systems  Constitutional: Positive for fatigue and fever. Negative for activity change.  HENT: Positive for congestion and rhinorrhea. Negative for ear pain.   Eyes: Negative for discharge.  Respiratory: Positive for cough. Negative for shortness of breath and wheezing.   Cardiovascular: Negative for chest pain.       Objective:   Physical Exam  Constitutional: She appears well-developed.  HENT:  Head: Normocephalic.  Nose: Nose normal.  Mouth/Throat: Oropharynx is clear and moist. No oropharyngeal exudate.  Neck: Neck supple.  Cardiovascular: Normal rate and normal heart sounds.   No murmur heard. Pulmonary/Chest: Effort normal and breath sounds normal. She has no wheezes.  Lymphadenopathy:    She has no cervical adenopathy.  Skin: Skin is warm and dry.  Nursing note and vitals reviewed. Mild chest wall tenderness no rails or rhonchi heard 98 o2 sat at rest  Patient requested refills of her thyroid medicine this was granted     Assessment & Plan:  Patient with more than likely costochondritis and pleuritic pain related to recent infection  I do believe that there could be some secondary sinusitis as well as bronchial infection doxycycline twice a day for the next 10 days  I do not believe the patient  needs any further x-rays labs or scans currently if she gets progressively worse follow-up  Prednisone taper over the course of the next several days to help with inflammation.

## 2016-12-17 ENCOUNTER — Ambulatory Visit (HOSPITAL_COMMUNITY)
Admission: RE | Admit: 2016-12-17 | Discharge: 2016-12-17 | Disposition: A | Discharge status: Home / Self Care | Payer: Managed Care, Other (non HMO) | Source: Ambulatory Visit | Attending: Family Medicine | Admitting: Family Medicine

## 2016-12-17 ENCOUNTER — Ambulatory Visit (INDEPENDENT_AMBULATORY_CARE_PROVIDER_SITE_OTHER): Payer: Managed Care, Other (non HMO) | Admitting: Family Medicine

## 2016-12-17 ENCOUNTER — Encounter: Payer: Self-pay | Admitting: Family Medicine

## 2016-12-17 VITALS — BP 132/80 | Temp 98.5°F | Ht 65.0 in | Wt 273.6 lb

## 2016-12-17 DIAGNOSIS — J209 Acute bronchitis, unspecified: Secondary | ICD-10-CM

## 2016-12-17 DIAGNOSIS — R0789 Other chest pain: Secondary | ICD-10-CM

## 2016-12-17 DIAGNOSIS — J019 Acute sinusitis, unspecified: Secondary | ICD-10-CM | POA: Diagnosis not present

## 2016-12-17 DIAGNOSIS — R071 Chest pain on breathing: Secondary | ICD-10-CM | POA: Diagnosis not present

## 2016-12-17 MED ORDER — AMOXICILLIN-POT CLAVULANATE 875-125 MG PO TABS: 1.0000 | ORAL_TABLET | Freq: Two times a day (BID) | ORAL | 0 refills | Status: DC

## 2016-12-17 MED ORDER — MELOXICAM 15 MG PO TABS: 15.0000 mg | ORAL_TABLET | Freq: Every day | ORAL | 0 refills | Status: DC

## 2016-12-17 NOTE — Progress Notes (Signed)
   Subjective:    Patient ID: Summer [name], female    DOB: August 28, 1961, 56 y.o.   MRN: [id number]  HPI  Patient arrives with continued cough and sob after having the flu and finishing all her steroids. The patient relates a sharp chest light discomforts at last a few seconds at a time come and go she also relates ongoing drainage and coughing She does relate some head congestion she denies wheezing. She denies nausea vomiting denies high fever chills sweats. Review of Systems    please see above. Objective:   Physical Exam Does not appear to be in any distress has some chest wall discomfort to palpation heart regular no crackles or rails noted extremities no edema       Assessment & Plan:  Persistent respiratory illness Subjective shortness of breath O2 sats ration looks good No pneumonia heard on exam X-ray does not show pneumonia Possible secondary sinusitis Augmentin prescribed take for 10 days Has appointment with cardiology to help work out intermittent chest pains-patient does have moderate level of worry concerning all of this. She does have risk factors.

## 2017-01-05 ENCOUNTER — Ambulatory Visit (INDEPENDENT_AMBULATORY_CARE_PROVIDER_SITE_OTHER): Payer: Managed Care, Other (non HMO) | Admitting: Cardiovascular Disease

## 2017-01-05 ENCOUNTER — Encounter: Payer: Self-pay | Admitting: Cardiovascular Disease

## 2017-01-05 VITALS — BP 152/92 | HR 98 | Ht 65.0 in | Wt 279.0 lb

## 2017-01-05 DIAGNOSIS — R0609 Other forms of dyspnea: Secondary | ICD-10-CM

## 2017-01-05 DIAGNOSIS — R0600 Dyspnea, unspecified: Secondary | ICD-10-CM

## 2017-01-05 DIAGNOSIS — R002 Palpitations: Secondary | ICD-10-CM

## 2017-01-05 DIAGNOSIS — R079 Chest pain, unspecified: Secondary | ICD-10-CM | POA: Diagnosis not present

## 2017-01-05 DIAGNOSIS — I10 Essential (primary) hypertension: Secondary | ICD-10-CM

## 2017-01-05 NOTE — Progress Notes (Signed)
CARDIOLOGY CONSULT NOTE  Patient ID: Summer [name] MRN: [id number] DOB/AGE: 05-16-1961 56 y.o.  Admit date: (Not on file) Primary Physician: Sallee Lange, MD Referring Physician: Pearson Forster NP  Reason for Consultation: palpitations, chest pain  HPI: 56 year old woman referred for evaluation of chest pain. She was diagnosed with influenza earlier this year. She saw her PCP on 2/9 and 2/16 most recently. He felt that she had costochondritis and pleuritic pain. He did note that she has risk factors for coronary artery disease.  Lipids 10/07/16: Total cholesterol 163, triglycerides 112, HDL 54, LDL 87.  TSH and HbA1C normal as well.  CT angiogram of the chest 08/28/16: No mention of coronary artery calcification.  There is some suspicion for a small pulmonary embolism so she underwent VQ scan which was normal.  Echocardiogram 06/17/14: Normal left ventricular systolic function and regional wall motion, LVEF 60-65%, mild LVH, grade 1 diastolic dysfunction.  ECG 08/27/16: Sinus tachycardia, 114 bpm.  She had chest pain in April 2016 and underwent a normal dobutamine stress echocardiogram. LVEF at rest was 55-60% at that time. She was evaluated by [facility].  She has developed increasing exertional dyspnea since August 2017. She has noticed it when walking outside and carrying buckets. She has retrosternal chest discomfort both with and without exertion. It is described as both sharp and associated with chest heaviness. She has chronic left arm pain. She has also noticed bilateral ankle swelling since August 2017, right greater than left.  Past medical history also includes hypertension, Hashimoto's thyroiditis, and fibromyalgia.  She also complains of weight gain in spite of trying to diet. Said she used to weigh 115 pounds for most of her life. She quit smoking and this is when the weight gain began. She said she is put on 50 pounds from April 2016 to August  2017.    Allergies  Allergen Reactions  . Avelox [Moxifloxacin Hcl In Nacl] Other (See Comments)    "heart beats weird"  . Bee Venom Hives  . Doxycycline     Small whelps, not sure if allergy  . Shellfish Allergy Hives    Current Outpatient Prescriptions  Medication Sig Dispense Refill  . acyclovir (ZOVIRAX) 400 MG tablet Take 400 mg by mouth 2 (two) times daily.    Marland Kitchen albuterol (PROVENTIL HFA;VENTOLIN HFA) 108 (90 Base) MCG/ACT inhaler Inhale 2 puffs into the lungs every 4 (four) hours as needed. 1 Inhaler 0  . albuterol (PROVENTIL) (2.5 MG/3ML) 0.083% nebulizer solution Take 3 mLs (2.5 mg total) by nebulization every 4 (four) hours as needed for wheezing or shortness of breath. 25 vial 0  . Ascorbic Acid (VITAMIN C) POWD Take 5 mLs by mouth 2 (two) times daily.    Marland Kitchen aspirin EC 81 MG tablet Take 81 mg by mouth daily.    . Cholecalciferol (VITAMIN D-3) 5000 UNITS TABS Take 1 tablet by mouth daily.    . Folic Acid-Vit O3-ZCH Y85 (HOMOCYSTEINE FORMULA) 0.8-50-0.1 MG TABS Take 1 tablet by mouth daily.    Marland Kitchen ibuprofen (ADVIL,MOTRIN) 600 MG tablet Take 1 tablet (600 mg total) by mouth every 6 (six) hours as needed for mild pain or moderate pain. 15 tablet 0  . Magnesium 200 MG TABS Take 2 tablets by mouth 2 (two) times daily.     . Multiple Vitamins-Minerals (CLINICAL NUTRIENTS FOR WOMEN PO) Take 2 capsules by mouth 2 (two) times daily.    Marland Kitchen OLIVE LEAF PO Take 1.5 tablets by  mouth daily.    Marland Kitchen thyroid (ARMOUR THYROID) 60 MG tablet Take 1 tablet (60 mg total) by mouth daily before breakfast. 30 tablet 6  . ALPRAZolam (XANAX) 0.5 MG tablet 1/2 - 1 po BID prn anxiety (Patient not taking: Reported on 01/05/2017) 30 tablet 0  . hydrochlorothiazide (HYDRODIURIL) 25 MG tablet Take 1 tablet (25 mg total) by mouth daily. Prn edema (Patient not taking: Reported on 01/05/2017) 30 tablet 2  . HYDROcodone-acetaminophen (NORCO/VICODIN) 5-325 MG tablet Take 1 tablet by mouth every 6 (six) hours as needed for  severe pain. (Patient not taking: Reported on 01/05/2017) 30 tablet 0  . meloxicam (MOBIC) 15 MG tablet Take 1 tablet (15 mg total) by mouth daily. (Patient not taking: Reported on 01/05/2017) 30 tablet 0  . metoprolol succinate (TOPROL-XL) 25 MG 24 hr tablet Take 1 tablet (25 mg total) by mouth daily. (Patient not taking: Reported on 01/05/2017) 30 tablet 2  . OVER THE COUNTER MEDICATION Take 2 capsules by mouth 2 (two) times daily. Reported on 02/05/2016    . ranitidine (ZANTAC) 300 MG tablet Take 1 tablet (300 mg total) by mouth at bedtime. For acid reflux (Patient not taking: Reported on 01/05/2017) 30 tablet 5   No current facility-administered medications for this visit.     Past Medical History:  Diagnosis Date  . Arthritis   . Bell's palsy   . Dysrhythmia, cardiac   . Fibromyalgia   . Hypertension   . Thyroid disease     Past Surgical History:  Procedure Laterality Date  . COLONOSCOPY N/A 01/23/2014   Procedure: COLONOSCOPY;  Surgeon: Rogene Houston, MD;  Location: [facility];  Service: Endoscopy;  Laterality: N/A;  200  . TONSILLECTOMY      Social History   Social History  . Marital status: Married    Spouse name: N/A  . Number of children: N/A  . Years of education: N/A   Occupational History  . Not on file.   Social History Main Topics  . Smoking status: Former Smoker    Types: Cigarettes    Quit date: 11/08/2003  . Smokeless tobacco: Never Used     Comment: quit smoking 10 yrs   . Alcohol use No  . Drug use: No  . Sexual activity: Not Currently    Birth control/ protection: Post-menopausal   Other Topics Concern  . Not on file   Social History Narrative  . No narrative on file     No family history of premature CAD in 1st degree relatives.  Prior to Admission medications   Medication Sig Start Date End Date Taking? Authorizing Provider  acyclovir (ZOVIRAX) 400 MG tablet Take 400 mg by mouth 2 (two) times daily.   Yes Historical Provider, MD  albuterol  (PROVENTIL HFA;VENTOLIN HFA) 108 (90 Base) MCG/ACT inhaler Inhale 2 puffs into the lungs every 4 (four) hours as needed. 09/10/16  Yes Nilda Simmer, NP  albuterol (PROVENTIL) (2.5 MG/3ML) 0.083% nebulizer solution Take 3 mLs (2.5 mg total) by nebulization every 4 (four) hours as needed for wheezing or shortness of breath. 09/10/16  Yes Nilda Simmer, NP  Ascorbic Acid (VITAMIN C) POWD Take 5 mLs by mouth 2 (two) times daily.   Yes Historical Provider, MD  aspirin EC 81 MG tablet Take 81 mg by mouth daily.   Yes Historical Provider, MD  Cholecalciferol (VITAMIN D-3) 5000 UNITS TABS Take 1 tablet by mouth daily.   Yes Historical Provider, MD  Folic Acid-Vit B0-FBP  B12 (HOMOCYSTEINE FORMULA) 0.8-50-0.1 MG TABS Take 1 tablet by mouth daily.   Yes Historical Provider, MD  ibuprofen (ADVIL,MOTRIN) 600 MG tablet Take 1 tablet (600 mg total) by mouth every 6 (six) hours as needed for mild pain or moderate pain. 08/28/16  Yes Clayton Bibles, PA-C  Magnesium 200 MG TABS Take 2 tablets by mouth 2 (two) times daily.    Yes Historical Provider, MD  Multiple Vitamins-Minerals (CLINICAL NUTRIENTS FOR WOMEN PO) Take 2 capsules by mouth 2 (two) times daily.   Yes Historical Provider, MD  OLIVE LEAF PO Take 1.5 tablets by mouth daily.   Yes Historical Provider, MD  thyroid (ARMOUR THYROID) 60 MG tablet Take 1 tablet (60 mg total) by mouth daily before breakfast. 12/10/16  Yes Kathyrn Drown, MD  ALPRAZolam Duanne Moron) 0.5 MG tablet 1/2 - 1 po BID prn anxiety Patient not taking: Reported on 01/05/2017 10/07/16   Nilda Simmer, NP  hydrochlorothiazide (HYDRODIURIL) 25 MG tablet Take 1 tablet (25 mg total) by mouth daily. Prn edema Patient not taking: Reported on 01/05/2017 10/22/16   Nilda Simmer, NP  HYDROcodone-acetaminophen (NORCO/VICODIN) 5-325 MG tablet Take 1 tablet by mouth every 6 (six) hours as needed for severe pain. Patient not taking: Reported on 01/05/2017 12/10/16   Kathyrn Drown, MD  meloxicam (MOBIC) 15  MG tablet Take 1 tablet (15 mg total) by mouth daily. Patient not taking: Reported on 01/05/2017 12/17/16   Kathyrn Drown, MD  metoprolol succinate (TOPROL-XL) 25 MG 24 hr tablet Take 1 tablet (25 mg total) by mouth daily. Patient not taking: Reported on 01/05/2017 10/13/16   Nilda Simmer, NP  OVER THE COUNTER MEDICATION Take 2 capsules by mouth 2 (two) times daily. Reported on 02/05/2016    Historical Provider, MD  ranitidine (ZANTAC) 300 MG tablet Take 1 tablet (300 mg total) by mouth at bedtime. For acid reflux Patient not taking: Reported on 01/05/2017 10/07/16   Nilda Simmer, NP     Review of systems complete and found to be negative unless listed above in HPI     Physical exam Blood pressure (!) 152/92, pulse 98, height 5\' 5"  (1.651 m), weight 279 lb (126.6 kg), last menstrual period 01/23/2014, SpO2 95 %. General: NAD Neck: No JVD, no thyromegaly or thyroid nodule.  Lungs: Clear to auscultation bilaterally with normal respiratory effort. CV: Nondisplaced PMI. Regular rate and rhythm, normal S1/S2, no S3/S4, no murmur.  + chest wall tenderness. Trace b/l peri-ankle edema.    Abdomen: Soft, nontender, morbidly obese. Skin: Intact without lesions or rashes.  Neurologic: Alert and oriented x 3.  Psych: Normal affect. Extremities: No clubbing or cyanosis.  HEENT: Normal.   ECG: Most recent ECG reviewed.  Telemetry: Independently reviewed.  Labs:   Lab Results  Component Value Date   WBC 9.1 08/27/2016   HGB 16.2 (H) 08/27/2016   HCT 46.9 (H) 08/27/2016   MCV 88.0 08/27/2016   PLT 278 08/27/2016   No results for input(s): NA, K, CL, CO2, BUN, CREATININE, CALCIUM, PROT, BILITOT, ALKPHOS, ALT, AST, GLUCOSE in the last 168 hours.  Invalid input(s): LABALBU Lab Results  Component Value Date   CKTOTAL 54 03/23/2013   CKMB 1.9 04/03/2012   TROPONINI <0.03 08/28/2016    Lab Results  Component Value Date   CHOL 163 10/07/2016   CHOL 163 04/03/2014   Lab Results    Component Value Date   HDL 54 10/07/2016   HDL 43 04/03/2014   Lab  Results  Component Value Date   LDLCALC 87 10/07/2016   [place] 95 04/03/2014   Lab Results  Component Value Date   TRIG 112 10/07/2016   TRIG 124 04/03/2014   Lab Results  Component Value Date   CHOLHDL 3.0 10/07/2016   CHOLHDL 3.8 04/03/2014   No results found for: LDLDIRECT       Studies: No results found.  ASSESSMENT AND PLAN:  1. Chest pain and exertional dyspnea: Has both typical and atypical symptoms. Cardiac arrest risk factors include hypertension, obesity, and prior history of smoking. Normal dobutamine stress echocardiogram in April 2016. I will obtain a 2 day Lexiscan Myoview stress test to evaluate for ischemia.  2. Palpitations: I will obtain a one-week event monitor to evaluate for tachyarrhythmias or premature contractions.  3. Hypertension: Elevated today. Will monitor.  4. Morbid obesity: I will defer to her PCP for referral to endocrinology and/or wellness physician in [place].  Dispo: fu 6 weeks   Signed: Kate Sable, M.D., F.A.C.C.  01/05/2017, 8:38 AM

## 2017-01-05 NOTE — Patient Instructions (Signed)
Your physician recommends that you continue on your current medications as directed. Please refer to the Current Medication list given to you today.   Your physician has requested that you have a lexiscan myoview. For further information please visit [email or web address]. Please follow instruction sheet, as given.   Your physician has recommended that you wear an event monitor. Event monitors are medical devices that record the heart's electrical activity. Doctors most often [place] these monitors to diagnose arrhythmias. Arrhythmias are problems with the speed or rhythm of the heartbeat. The monitor is a small, portable device. You can wear one while you do your normal daily activities. This is usually used to diagnose what is causing palpitations/syncope (passing out).  Your physician recommends that you schedule a follow-up appointment in: 6 WEEKS   Thanks for choosing [facility]!!!

## 2017-01-12 ENCOUNTER — Inpatient Hospital Stay (HOSPITAL_COMMUNITY): Admission: RE | Admit: 2017-01-12 | Payer: Managed Care, Other (non HMO) | Source: Ambulatory Visit

## 2017-01-12 ENCOUNTER — Encounter (HOSPITAL_COMMUNITY): Payer: Managed Care, Other (non HMO)

## 2017-01-13 ENCOUNTER — Encounter (HOSPITAL_COMMUNITY): Payer: Managed Care, Other (non HMO)

## 2017-01-19 ENCOUNTER — Encounter (HOSPITAL_COMMUNITY): Payer: Managed Care, Other (non HMO)

## 2017-01-20 ENCOUNTER — Encounter (HOSPITAL_COMMUNITY): Payer: Managed Care, Other (non HMO)

## 2017-01-21 ENCOUNTER — Ambulatory Visit (INDEPENDENT_AMBULATORY_CARE_PROVIDER_SITE_OTHER): Payer: Managed Care, Other (non HMO)

## 2017-01-21 DIAGNOSIS — R002 Palpitations: Secondary | ICD-10-CM

## 2017-02-07 ENCOUNTER — Telehealth: Payer: Self-pay | Admitting: *Deleted

## 2017-02-07 NOTE — Telephone Encounter (Signed)
-----   Message from Herminio Commons, MD sent at 02/04/2017  5:03 PM EDT ----- No arrhythmias.

## 2017-02-07 NOTE — Telephone Encounter (Signed)
Called patient with test results. No answer. Left message to call back.  

## 2017-02-09 NOTE — Telephone Encounter (Signed)
Patient called and states that she has only wore monitor for 2 days before being called with results. [employer] called today and states that the pt's monitor base lined on 01/21/17. Pt's insurance requires that her monitor only report when hit by pt or when outside of set limits. Pt. Called [employer] to request another 7 days. Please advise.

## 2017-02-09 NOTE — Telephone Encounter (Signed)
Agree with another 7 days.

## 2017-02-09 NOTE — Telephone Encounter (Signed)
Spoke with pt. She will call in the morning and let us know if she would like to continue with the monitor she has or try the new monitor.

## 2017-02-23 ENCOUNTER — Ambulatory Visit: Payer: Managed Care, Other (non HMO) | Admitting: Cardiovascular Disease

## 2017-03-09 ENCOUNTER — Other Ambulatory Visit: Payer: Self-pay | Admitting: Nurse Practitioner

## 2017-03-16 ENCOUNTER — Encounter: Payer: Self-pay | Admitting: Cardiovascular Disease

## 2017-03-16 ENCOUNTER — Ambulatory Visit (INDEPENDENT_AMBULATORY_CARE_PROVIDER_SITE_OTHER): Payer: Managed Care, Other (non HMO) | Admitting: Cardiovascular Disease

## 2017-03-16 VITALS — BP 134/86 | HR 84 | Ht 65.0 in | Wt 276.0 lb

## 2017-03-16 DIAGNOSIS — M25472 Effusion, left ankle: Secondary | ICD-10-CM

## 2017-03-16 DIAGNOSIS — M25476 Effusion, unspecified foot: Secondary | ICD-10-CM

## 2017-03-16 DIAGNOSIS — I10 Essential (primary) hypertension: Secondary | ICD-10-CM

## 2017-03-16 DIAGNOSIS — M25475 Effusion, left foot: Secondary | ICD-10-CM

## 2017-03-16 DIAGNOSIS — R079 Chest pain, unspecified: Secondary | ICD-10-CM

## 2017-03-16 DIAGNOSIS — R0609 Other forms of dyspnea: Secondary | ICD-10-CM

## 2017-03-16 DIAGNOSIS — M25473 Effusion, unspecified ankle: Secondary | ICD-10-CM | POA: Diagnosis not present

## 2017-03-16 DIAGNOSIS — K219 Gastro-esophageal reflux disease without esophagitis: Secondary | ICD-10-CM | POA: Diagnosis not present

## 2017-03-16 DIAGNOSIS — R002 Palpitations: Secondary | ICD-10-CM | POA: Diagnosis not present

## 2017-03-16 DIAGNOSIS — R0600 Dyspnea, unspecified: Secondary | ICD-10-CM

## 2017-03-16 MED ORDER — FUROSEMIDE 20 MG PO TABS: 20.0000 mg | ORAL_TABLET | Freq: Every day | ORAL | 3 refills | Status: DC

## 2017-03-16 NOTE — Patient Instructions (Signed)
Medication Instructions:  Start lasix 20 mg daily AS NEEDED FOR ANKLE SWELLING   Labwork: NONE  Testing/Procedures: Your physician has requested that you have cardiac CT. Cardiac computed tomography (CT) is a painless test that uses an x-ray machine to take clear, detailed pictures of your heart. For further information please visit [email or web address]. Please follow instruction sheet as given.     Follow-Up: Your physician recommends that you schedule a follow-up appointment in: 3 MONTHS    Any Other Special Instructions Will Be Listed Below (If Applicable).     If you need a refill on your cardiac medications before your next appointment, please call your pharmacy.

## 2017-03-16 NOTE — Progress Notes (Signed)
SUBJECTIVE: The patient presents for follow up of chest pain, exertional dyspnea, and palpitations.  Event monitoring showed sinus tachycardia, 110 bpm, with no significant arrhythmias.  She did not get the stress test I ordered due to scheduling conflicts.  She had chest pain in April 2016 and underwent a normal dobutamine stress echocardiogram. LVEF at rest was 55-60% at that time. She was evaluated by [facility].  She has retrosternal chest pain while at rest and not with exertion. It is followed by belching. She had been on omeprazole in the past.  Also complains of bilateral ankle swelling.   Review of Systems: As per "subjective", otherwise negative.  Allergies  Allergen Reactions  . Avelox [Moxifloxacin Hcl In Nacl] Other (See Comments)    "heart beats weird"  . Bee Venom Hives  . Doxycycline     Small whelps, not sure if allergy  . Shellfish Allergy Hives    Current Outpatient Prescriptions  Medication Sig Dispense Refill  . acyclovir (ZOVIRAX) 400 MG tablet Take 400 mg by mouth 2 (two) times daily.    Marland Kitchen albuterol (PROVENTIL HFA;VENTOLIN HFA) 108 (90 Base) MCG/ACT inhaler Inhale 2 puffs into the lungs every 4 (four) hours as needed. 1 Inhaler 0  . albuterol (PROVENTIL) (2.5 MG/3ML) 0.083% nebulizer solution Take 3 mLs (2.5 mg total) by nebulization every 4 (four) hours as needed for wheezing or shortness of breath. 25 vial 0  . ALPRAZolam (XANAX) 0.5 MG tablet TAKE ONE-HALF TO ONE TABLET BY MOUTH TWICE DAILY AS NEEDED FOR ANXIETY 30 tablet 0  . Ascorbic Acid (VITAMIN C) POWD Take 5 mLs by mouth 2 (two) times daily.    Marland Kitchen aspirin EC 81 MG tablet Take 81 mg by mouth daily.    . Cholecalciferol (VITAMIN D-3) 5000 UNITS TABS Take 1 tablet by mouth daily.    . Folic Acid-Vit T2-WPY K99 (HOMOCYSTEINE FORMULA) 0.8-50-0.1 MG TABS Take 1 tablet by mouth daily.    Marland Kitchen ibuprofen (ADVIL,MOTRIN) 600 MG tablet Take 1 tablet (600 mg total) by mouth every 6 (six) hours  as needed for mild pain or moderate pain. 15 tablet 0  . Magnesium 200 MG TABS Take 2 tablets by mouth 2 (two) times daily.     . Multiple Vitamins-Minerals (CLINICAL NUTRIENTS FOR WOMEN PO) Take 2 capsules by mouth 2 (two) times daily.    Marland Kitchen OLIVE LEAF PO Take 1.5 tablets by mouth daily.    Marland Kitchen thyroid (ARMOUR THYROID) 60 MG tablet Take 1 tablet (60 mg total) by mouth daily before breakfast. 30 tablet 6   No current facility-administered medications for this visit.     Past Medical History:  Diagnosis Date  . Arthritis   . Bell's palsy   . Dysrhythmia, cardiac   . Fibromyalgia   . Hypertension   . Thyroid disease     Past Surgical History:  Procedure Laterality Date  . COLONOSCOPY N/A 01/23/2014   Procedure: COLONOSCOPY;  Surgeon: Rogene Houston, MD;  Location: [facility];  Service: Endoscopy;  Laterality: N/A;  200  . TONSILLECTOMY      Social History   Social History  . Marital status: Married    Spouse name: N/A  . Number of children: N/A  . Years of education: N/A   Occupational History  . Not on file.   Social History Main Topics  . Smoking status: Former Smoker    Types: Cigarettes    Quit date: 11/08/2003  . Smokeless tobacco:  Never Used     Comment: quit smoking 10 yrs   . Alcohol use No  . Drug use: No  . Sexual activity: Not Currently    Birth control/ protection: Post-menopausal   Other Topics Concern  . Not on file   Social History Narrative  . No narrative on file     Vitals:   03/16/17 1514  BP: 134/86  Pulse: 84  SpO2: 97%  Weight: 276 lb (125.2 kg)  Height: 5\' 5"  (1.651 m)    Wt Readings from Last 3 Encounters:  03/16/17 276 lb (125.2 kg)  01/05/17 279 lb (126.6 kg)  12/17/16 273 lb 9.6 oz (124.1 kg)     PHYSICAL EXAM General: NAD HEENT: Normal. Neck: No JVD, no thyromegaly. Lungs: Clear to auscultation bilaterally with normal respiratory effort. CV: Nondisplaced PMI.  Regular rate and rhythm, normal S1/S2, no S3/S4, no  murmur. Trace b/l periankle edema.   Abdomen: Morbidly obese. Neurologic: Alert and oriented.  Psych: Normal affect. Skin: Normal. Musculoskeletal: No gross deformities.    ECG: Most recent ECG reviewed.   Labs: Lab Results  Component Value Date/Time   K 4.3 10/07/2016 03:37 PM   BUN 17 10/07/2016 03:37 PM   CREATININE 0.60 10/07/2016 03:37 PM   ALT 26 10/07/2016 03:37 PM   TSH 2.59 10/07/2016 03:37 PM   HGB 16.2 (H) 08/27/2016 10:06 PM     Lipids: Lab Results  Component Value Date/Time   LDLCALC 87 10/07/2016 03:37 PM   CHOL 163 10/07/2016 03:37 PM   TRIG 112 10/07/2016 03:37 PM   HDL 54 10/07/2016 03:37 PM       ASSESSMENT AND PLAN: 1. Chest pain and exertional dyspnea: Has both typical and atypical symptoms. Cardiac arrest risk factors include hypertension, obesity, and prior history of smoking. Normal dobutamine stress echocardiogram in April 2016. Did not obtain the 2 day Lexiscan Myoview stress test I previously ordered. I will obtain a cardiac CT to evaluate for CAD.  2. Palpitations: No arrhythmias with event monitoring.  3. Hypertension: Controlled.  4. Morbid obesity: I will defer to her PCP for referral to endocrinology and/or wellness physician in [place].  5. Bilateral ankle swelling: I will prescribe Lasix 20 mg prn. Likely due to morbid obesity and chronic venous disease.  6. GERD: I have recommended she try ranitidine or perhaps omeprazole. I also educated her on foods which trigger GERD.   Disposition: Follow up 3 months.  Kate Sable, M.D., F.A.C.C.

## 2017-03-18 ENCOUNTER — Other Ambulatory Visit: Payer: Self-pay

## 2017-03-18 DIAGNOSIS — R079 Chest pain, unspecified: Secondary | ICD-10-CM

## 2017-03-18 DIAGNOSIS — R0602 Shortness of breath: Secondary | ICD-10-CM

## 2017-03-29 ENCOUNTER — Telehealth: Payer: Self-pay | Admitting: Adult Health

## 2017-03-29 NOTE — Telephone Encounter (Signed)
Will FYI Dr Koneswaran 

## 2017-03-29 NOTE — Telephone Encounter (Signed)
Pt lvm about having her heart CT canceled due to ins not paying for it

## 2017-03-30 ENCOUNTER — Ambulatory Visit (HOSPITAL_COMMUNITY): Payer: Managed Care, Other (non HMO)

## 2017-04-08 ENCOUNTER — Encounter: Payer: Self-pay | Admitting: Nurse Practitioner

## 2017-04-08 ENCOUNTER — Ambulatory Visit (INDEPENDENT_AMBULATORY_CARE_PROVIDER_SITE_OTHER): Payer: Managed Care, Other (non HMO) | Admitting: Nurse Practitioner

## 2017-04-08 VITALS — BP 128/88 | Temp 98.1°F | Ht 65.0 in | Wt 273.0 lb

## 2017-04-08 DIAGNOSIS — A938 Other specified arthropod-borne viral fevers: Secondary | ICD-10-CM | POA: Diagnosis not present

## 2017-04-08 DIAGNOSIS — S70361A Insect bite (nonvenomous), right thigh, initial encounter: Secondary | ICD-10-CM | POA: Diagnosis not present

## 2017-04-08 DIAGNOSIS — W57XXXA Bitten or stung by nonvenomous insect and other nonvenomous arthropods, initial encounter: Secondary | ICD-10-CM | POA: Diagnosis not present

## 2017-04-08 MED ORDER — DOXYCYCLINE HYCLATE 100 MG PO TABS: 100.0000 mg | ORAL_TABLET | Freq: Two times a day (BID) | ORAL | 0 refills | Status: DC

## 2017-04-09 ENCOUNTER — Encounter: Payer: Self-pay | Admitting: Nurse Practitioner

## 2017-04-09 NOTE — Progress Notes (Signed)
Subjective:  Presents for c/o 2 tick bites to the right leg that occurred on 6/5. Within past 48 hours has developed fever, fatigue, headache and joint pain. No other rash at this time. No vomiting. Mild photosensitivity and neck pain. No sore throat, cough or ear pain.   Objective:   BP 128/88   Temp 98.1 F (36.7 C) (Oral)   Ht 5\' 5"  (1.651 m)   Wt 273 lb (123.8 kg)   LMP 01/23/2014 Comment: spotting  BMI 45.43 kg/m  NAD. Alert, oriented. TMs mild clear effusion. Pharynx clear. Neck supple with mild anterior adenopathy. Normal ROM of neck with mild tenderness. Lungs clear. Heart RRR. Two small pink papules noted on right upper leg at site of tick bites. No other rash noted.   Assessment:  Tick bite of right thigh, initial encounter  Possible Tick fever    Plan:   Meds ordered this encounter  Medications  . doxycycline (VIBRA-TABS) 100 MG tablet    Sig: Take 1 tablet (100 mg total) by mouth 2 (two) times daily.    Dispense:  42 tablet    Refill:  0    Has taken tetracycline without difficulty    Order Specific Question:   Supervising Provider    Answer:   Mikey Kirschner [[id number]]   Warning signs reviewed including worsening fever, headache and neck stiffness. Call back next week if no improvement, go to ED sooner if needed.

## 2017-04-13 ENCOUNTER — Emergency Department (HOSPITAL_COMMUNITY)
Admission: EM | Admit: 2017-04-13 | Discharge: 2017-04-14 | Disposition: A | Discharge status: Home / Self Care | Payer: Managed Care, Other (non HMO) | Attending: Emergency Medicine | Admitting: Emergency Medicine

## 2017-04-13 ENCOUNTER — Emergency Department (HOSPITAL_COMMUNITY): Payer: Managed Care, Other (non HMO)

## 2017-04-13 ENCOUNTER — Encounter (HOSPITAL_COMMUNITY): Payer: Self-pay | Admitting: *Deleted

## 2017-04-13 DIAGNOSIS — I10 Essential (primary) hypertension: Secondary | ICD-10-CM | POA: Insufficient documentation

## 2017-04-13 DIAGNOSIS — M791 Myalgia: Secondary | ICD-10-CM | POA: Diagnosis not present

## 2017-04-13 DIAGNOSIS — Z87891 Personal history of nicotine dependence: Secondary | ICD-10-CM | POA: Insufficient documentation

## 2017-04-13 DIAGNOSIS — R52 Pain, unspecified: Secondary | ICD-10-CM

## 2017-04-13 DIAGNOSIS — Z79899 Other long term (current) drug therapy: Secondary | ICD-10-CM | POA: Insufficient documentation

## 2017-04-13 DIAGNOSIS — Z791 Long term (current) use of non-steroidal anti-inflammatories (NSAID): Secondary | ICD-10-CM | POA: Diagnosis not present

## 2017-04-13 DIAGNOSIS — G510 Bell's palsy: Secondary | ICD-10-CM | POA: Insufficient documentation

## 2017-04-13 DIAGNOSIS — R079 Chest pain, unspecified: Secondary | ICD-10-CM | POA: Diagnosis not present

## 2017-04-13 DIAGNOSIS — R0602 Shortness of breath: Secondary | ICD-10-CM | POA: Diagnosis present

## 2017-04-13 LAB — HEPATIC FUNCTION PANEL
ALK PHOS: 82 U/L (ref 38–126)
ALT: 18 U/L (ref 14–54)
AST: 24 U/L (ref 15–41)
Albumin: 4.3 g/dL (ref 3.5–5.0)
BILIRUBIN INDIRECT: 0.8 mg/dL (ref 0.3–0.9)
BILIRUBIN TOTAL: 0.9 mg/dL (ref 0.3–1.2)
Bilirubin, Direct: 0.1 mg/dL (ref 0.1–0.5)
Total Protein: 7.4 g/dL (ref 6.5–8.1)

## 2017-04-13 LAB — CBC WITH DIFFERENTIAL/PLATELET
BASOS ABS: 0 10*3/uL (ref 0.0–0.1)
Basophils Relative: 0 %
EOS ABS: 0.1 10*3/uL (ref 0.0–0.7)
Eosinophils Relative: 1 %
HCT: 47.1 % — ABNORMAL HIGH (ref 36.0–46.0)
Hemoglobin: 16.2 g/dL — ABNORMAL HIGH (ref 12.0–15.0)
Lymphocytes Relative: 39 %
Lymphs Abs: 3.1 10*3/uL (ref 0.7–4.0)
MCH: 30.6 pg (ref 26.0–34.0)
MCHC: 34.4 g/dL (ref 30.0–36.0)
MCV: 88.9 fL (ref 78.0–100.0)
Monocytes Absolute: 0.6 10*3/uL (ref 0.1–1.0)
Monocytes Relative: 8 %
Neutro Abs: 4.1 10*3/uL (ref 1.7–7.7)
Neutrophils Relative %: 52 %
PLATELETS: 249 10*3/uL (ref 150–400)
RBC: 5.3 MIL/uL — AB (ref 3.87–5.11)
RDW: 14.7 % (ref 11.5–15.5)
WBC: 7.9 10*3/uL (ref 4.0–10.5)

## 2017-04-13 LAB — TROPONIN I: Troponin I: 0.03 ng/mL (ref <0.03)

## 2017-04-13 LAB — BASIC METABOLIC PANEL
Anion gap: 10 (ref 5–15)
BUN: 15 mg/dL (ref 6–20)
CHLORIDE: 102 mmol/L (ref 101–111)
CO2: 26 mmol/L (ref 22–32)
CREATININE: 0.71 mg/dL (ref 0.44–1.00)
Calcium: 9.4 mg/dL (ref 8.9–10.3)
GFR calc Af Amer: >60 mL/min (ref >60)
GFR calc non Af Amer: >60 mL/min (ref >60)
Glucose, Bld: 97 mg/dL (ref 65–99)
Potassium: 3.6 mmol/L (ref 3.5–5.1)
SODIUM: 138 mmol/L (ref 135–145)

## 2017-04-13 LAB — URINALYSIS, ROUTINE W REFLEX MICROSCOPIC
BILIRUBIN URINE: NEGATIVE
GLUCOSE, UA: NEGATIVE mg/dL
HGB URINE DIPSTICK: NEGATIVE
Ketones, ur: 80 mg/dL — AB
Leukocytes, UA: NEGATIVE
Nitrite: NEGATIVE
PH: 5 (ref 5.0–8.0)
Protein, ur: NEGATIVE mg/dL
SPECIFIC GRAVITY, URINE: 1.019 (ref 1.005–1.030)

## 2017-04-13 LAB — D-DIMER, QUANTITATIVE (NOT AT ARMC): D DIMER QUANT: 5.84 ug{FEU}/mL — AB (ref 0.00–0.50)

## 2017-04-13 MED ORDER — IBUPROFEN 800 MG PO TABS: 800.0000 mg | ORAL_TABLET | Freq: Three times a day (TID) | ORAL | 0 refills | Status: DC | PRN

## 2017-04-13 MED ORDER — HYDROMORPHONE HCL 1 MG/ML IJ SOLN: 1.0000 mg | Freq: Once | INTRAMUSCULAR | Status: DC

## 2017-04-13 MED ORDER — SODIUM CHLORIDE 0.9 % IV BOLUS (SEPSIS)
1000.0000 mL | Freq: Once | INTRAVENOUS | Status: AC
  Administered 2017-04-13: 1000 mL via INTRAVENOUS

## 2017-04-13 MED ORDER — ONDANSETRON HCL 4 MG/2ML IJ SOLN: 4.0000 mg | Freq: Once | INTRAMUSCULAR | Status: DC

## 2017-04-13 MED ORDER — KETOROLAC TROMETHAMINE 30 MG/ML IJ SOLN
30.0000 mg | Freq: Once | INTRAMUSCULAR | Status: DC
  Filled 2017-04-13: qty 1

## 2017-04-13 MED ORDER — DOXYCYCLINE HYCLATE 100 MG IV SOLR
100.0000 mg | Freq: Once | INTRAVENOUS | Status: AC
  Administered 2017-04-13: 100 mg via INTRAVENOUS
  Filled 2017-04-13: qty 100

## 2017-04-13 MED ORDER — DOXYCYCLINE HYCLATE 100 MG IV SOLR
INTRAVENOUS | Status: AC
  Filled 2017-04-13: qty 100

## 2017-04-13 MED ORDER — IBUPROFEN 800 MG PO TABS
800.0000 mg | ORAL_TABLET | Freq: Once | ORAL | Status: AC
  Administered 2017-04-14: 800 mg via ORAL
  Filled 2017-04-13: qty 1

## 2017-04-13 MED ORDER — IOPAMIDOL (ISOVUE-370) INJECTION 76%
100.0000 mL | Freq: Once | INTRAVENOUS | Status: AC | PRN
  Administered 2017-04-13: 100 mL via INTRAVENOUS

## 2017-04-13 NOTE — ED Provider Notes (Signed)
[facility] [facility] DEPT Provider Note   CSN: [id number] Arrival date & time: 04/13/17  1745     History   Chief Complaint Chief Complaint  Patient presents with  . Fatigue    HPI Summer [name] is a 56 y.o. female.  Patient states that she was exposed to tick a few days ago and was started on doxycycline. She complains of some shortness of breath, pain with inspiration,   aching all over   The history is provided by the patient. No language interpreter was used.  Chest Pain   This is a new problem. The current episode started 2 days ago. The problem occurs constantly. The problem has not changed since onset.The pain is associated with movement. The pain is present in the lateral region. The pain is at a severity of 4/10. The pain is moderate. Pertinent negatives include no abdominal pain, no back pain, no cough and no headaches.  Pertinent negatives for past medical history include no seizures.    Past Medical History:  Diagnosis Date  . Arthritis   . Bell's palsy   . Dysrhythmia, cardiac   . Fibromyalgia   . Hypertension   . Thyroid disease     Patient Active Problem List   Diagnosis Date Noted  . Palpitations 10/09/2016  . Prediabetes 06/10/2014  . Subclinical hypothyroidism 06/10/2014  . Abdominal pain, other specified site 12/26/2013  . Esophageal reflux 04/01/2013  . Fibromyalgia 03/22/2013  . Hypokalemia 03/22/2013  . Facial paralysis/Bells palsy 02/01/2013    Past Surgical History:  Procedure Laterality Date  . COLONOSCOPY N/A 01/23/2014   Procedure: COLONOSCOPY;  Surgeon: Rogene Houston, MD;  Location: [facility];  Service: Endoscopy;  Laterality: N/A;  200  . TONSILLECTOMY      OB History    No data available       Home Medications    Prior to Admission medications   Medication Sig Start Date End Date Taking? Authorizing Provider  acyclovir (ZOVIRAX) 400 MG tablet Take 400 mg by mouth 2 (two) times daily.    [provider]    albuterol (PROVENTIL HFA;VENTOLIN HFA) 108 (90 Base) MCG/ACT inhaler Inhale 2 puffs into the lungs every 4 (four) hours as needed. 09/10/16   Nilda Simmer, NP  albuterol (PROVENTIL) (2.5 MG/3ML) 0.083% nebulizer solution Take 3 mLs (2.5 mg total) by nebulization every 4 (four) hours as needed for wheezing or shortness of breath. 09/10/16   Nilda Simmer, NP  ALPRAZolam Duanne Moron) 0.5 MG tablet TAKE ONE-HALF TO ONE TABLET BY MOUTH TWICE DAILY AS NEEDED FOR ANXIETY 03/09/17   Nilda Simmer, NP  Ascorbic Acid (VITAMIN C) POWD Take 5 mLs by mouth 2 (two) times daily.    [provider]  aspirin EC 81 MG tablet Take 81 mg by mouth daily.    [provider]  Cholecalciferol (VITAMIN D-3) 5000 UNITS TABS Take 1 tablet by mouth daily.    [provider]  doxycycline (VIBRA-TABS) 100 MG tablet Take 1 tablet (100 mg total) by mouth 2 (two) times daily. 04/08/17   Nilda Simmer, NP  Folic Acid-Vit D2-KGU R42 (HOMOCYSTEINE FORMULA) 0.8-50-0.1 MG TABS Take 1 tablet by mouth daily.    [provider]  furosemide (LASIX) 20 MG tablet Take 1 tablet (20 mg total) by mouth daily. Take 20 mg daily as needed for ankle swelling. 03/16/17 06/14/17  Herminio Commons, MD  ibuprofen (ADVIL,MOTRIN) 600 MG tablet Take 1 tablet (600 mg total) by mouth  every 6 (six) hours as needed for mild pain or moderate pain. 08/28/16   Clayton Bibles, PA-C  Magnesium 200 MG TABS Take 2 tablets by mouth 2 (two) times daily.     [provider]  Multiple Vitamins-Minerals (CLINICAL NUTRIENTS FOR WOMEN PO) Take 2 capsules by mouth 2 (two) times daily.    [provider]  OLIVE LEAF PO Take 1.5 tablets by mouth daily.    [provider]  thyroid (ARMOUR THYROID) 60 MG tablet Take 1 tablet (60 mg total) by mouth daily before breakfast. 12/10/16   Kathyrn Drown, MD    Family History Family History  Problem Relation Age of Onset  . Adopted: Yes  . Cancer Mother         lung    Social History Social History  Substance Use Topics  . Smoking status: Former Smoker    Types: Cigarettes    Quit date: 11/08/2003  . Smokeless tobacco: Never Used     Comment: quit smoking 10 yrs   . Alcohol use No     Allergies   Avelox [moxifloxacin hcl in nacl]; Bee venom; Doxycycline; and Shellfish allergy   Review of Systems Review of Systems  Constitutional: Negative for appetite change and fatigue.  HENT: Negative for congestion, ear discharge and sinus pressure.   Eyes: Negative for discharge.  Respiratory: Negative for cough.   Cardiovascular: Positive for chest pain.  Gastrointestinal: Negative for abdominal pain and diarrhea.  Genitourinary: Negative for frequency and hematuria.  Musculoskeletal: Negative for back pain.  Skin: Negative for rash.  Neurological: Negative for seizures and headaches.  Psychiatric/Behavioral: Negative for hallucinations.     Physical Exam Updated Vital Signs BP (!) 148/78   Pulse 80   Temp 97.9 F (36.6 C) (Oral)   Resp 13   Ht 5\' 5"  (1.651 m)   Wt 123.8 kg (273 lb)   LMP 01/23/2014 Comment: spotting  SpO2 98%   BMI 45.43 kg/m   Physical Exam  Constitutional: She is oriented to person, place, and time. She appears well-developed.  HENT:  Head: Normocephalic.  Neck supple  Eyes: Conjunctivae and EOM are normal. No scleral icterus.  Neck: Neck supple. No thyromegaly present.  Cardiovascular: Normal rate and regular rhythm.  Exam reveals no gallop and no friction rub.   No murmur heard. Pulmonary/Chest: No stridor. She has no wheezes. She has no rales. She exhibits no tenderness.  Abdominal: She exhibits no distension. There is no tenderness. There is no rebound.  Musculoskeletal: Normal range of motion. She exhibits no edema.  Lymphadenopathy:    She has no cervical adenopathy.  Neurological: She is oriented to person, place, and time. She exhibits normal muscle tone. Coordination normal.  Skin: No rash  noted. No erythema.  Psychiatric: She has a normal mood and affect. Her behavior is normal.     ED Treatments / Results  Labs (all labs ordered are listed, but only abnormal results are displayed) Labs Reviewed  URINALYSIS, ROUTINE W REFLEX MICROSCOPIC - Abnormal; Notable for the following:       Result Value   Ketones, ur 80 (*)    All other components within normal limits  CBC WITH DIFFERENTIAL/PLATELET - Abnormal; Notable for the following:    RBC 5.30 (*)    Hemoglobin 16.2 (*)    HCT 47.1 (*)    All other components within normal limits  D-DIMER, QUANTITATIVE (NOT AT [facility]) - Abnormal; Notable for the following:    D-Dimer, Quant  5.84 (*)    All other components within normal limits  TROPONIN I - Abnormal; Notable for the following:    Troponin I 0.03 (*)    All other components within normal limits  BASIC METABOLIC PANEL  HEPATIC FUNCTION PANEL  ROCKY MTN SPOTTED FVR ABS PNL(IGG+IGM)  B. BURGDORFI ANTIBODIES  TROPONIN I    EKG  EKG Interpretation  Date/Time:  Wednesday April 13 2017 17:53:12 EDT Ventricular Rate:  102 PR Interval:  134 QRS Duration: 66 QT Interval:  334 QTC Calculation: 435 R Axis:   21 Text Interpretation:  Sinus tachycardia Otherwise normal ECG Confirmed by Delane Wessinger  MD, Broadus John [phone]) on 04/13/2017 10:29:54 PM       Radiology Dg Chest 2 View  Result Date: 04/13/2017 CLINICAL DATA:  Shortness of breath and chest pain with fatigue following tick bite 10 days ago. EXAM: CHEST  2 VIEW COMPARISON:  12/17/2016 and prior exams FINDINGS: The cardiomediastinal silhouette is unremarkable. There is no evidence of focal airspace disease, pulmonary edema, suspicious pulmonary nodule/mass, pleural effusion, or pneumothorax. No acute bony abnormalities are identified. IMPRESSION: No active cardiopulmonary disease. Electronically Signed   By: Margarette Canada M.D.   On: 04/13/2017 18:20   Dg Lumbar Spine Complete  Result Date: 04/13/2017 CLINICAL DATA:  Low back  pain for 1-1/2 weeks EXAM: LUMBAR SPINE - COMPLETE 4+ VIEW COMPARISON:  None. FINDINGS: There is no abnormality at the right L4-L5 level and may be due to severe facet hypertrophy, as seen on prior abdominopelvic CT. The upper lumbar levels are normal. There is narrowing of the L4-L5 and L5-S1 disc spaces with associated facet arthrosis. IMPRESSION: 1. Abnormal appearance at the right aspect of the L4-L5 space. While this is probably due to severe facet arthrosis, cross-sectional imaging is suggested for more definitive characterization and to exclude an acute fracture. 2. Normal appearance of the T12 -L3 levels. Electronically Signed   By: Ulyses Jarred M.D.   On: 04/13/2017 20:38    Procedures Procedures (including critical care time)  Medications Ordered in ED Medications  ketorolac (TORADOL) 30 MG/ML injection 30 mg (30 mg Intravenous Refused 04/13/17 1954)  doxycycline (VIBRAMYCIN) 100 mg in dextrose 5 % 250 mL IVPB (100 mg Intravenous New Bag/Given 04/13/17 2321)  iopamidol (ISOVUE-370) 76 % injection 100 mL (not administered)  sodium chloride 0.9 % bolus 1,000 mL (1,000 mLs Intravenous New Bag/Given 04/13/17 1954)     Initial Impression / Assessment and Plan / ED Course  I have reviewed the triage vital signs and the nursing notes.  Pertinent labs & imaging results that were available during my care of the patient were reviewed by me and considered in my medical decision making (see chart for details).     Patient with elevated d-dimer and troponin that is at the upper limits. We will get a CT angiogram chest and repeat her troponin. Patient is discharged home she will be put on Motrin and told to continue the doxycycline and follow-up with her PCP  Final Clinical Impressions(s) / ED Diagnoses   Final diagnoses:  Pain    New Prescriptions New Prescriptions   No medications on file     Milton Ferguson, MD 04/13/17 2338

## 2017-04-13 NOTE — ED Notes (Signed)
Pt has rash to central chest and bilateral redness to breast. Pt sates this is not usually there.

## 2017-04-13 NOTE — Discharge Instructions (Signed)
Follow-up with her doctor next week for recheck drink plenty of fluids take the Motrin for pain

## 2017-04-13 NOTE — ED Notes (Signed)
Date and time results received: 04/13/17 2305 (use smartphrase ".now" to insert current time)  Test: Troponin Critical Value: 0.03  Name of Provider Notified: Dr. Roderic Palau  Orders Received? No new orders Or Actions Taken?: no orders given

## 2017-04-13 NOTE — ED Notes (Signed)
Patient ambulatory to restroom with no difficulties noted.

## 2017-04-13 NOTE — ED Triage Notes (Signed)
Pt states she was bit by a tick on 6/3, went to here pcp on 6/8 (because she was having achy joints, fatigue, and chest pain), was put on medication but didn't start it until 6/11. Pt continues to have symptoms mentioned above.

## 2017-04-14 LAB — TROPONIN I: Troponin I: 0.03 ng/mL (ref <0.03)

## 2017-04-14 MED ORDER — CYCLOBENZAPRINE HCL 10 MG PO TABS
10.0000 mg | ORAL_TABLET | Freq: Once | ORAL | Status: DC
  Filled 2017-04-14: qty 1

## 2017-04-14 MED ORDER — FLUCONAZOLE 200 MG PO TABS: 200.0000 mg | ORAL_TABLET | Freq: Every day | ORAL | 0 refills | Status: AC

## 2017-04-14 NOTE — ED Notes (Signed)
Pt back from CT, IV infiltrated while in CT, Jennifer CT tech tried to restart IV with no success;

## 2017-04-14 NOTE — ED Provider Notes (Addendum)
Patient was left at change of shift to get her delta troponin and her CT angiogram of her chest. Patient states she still hurting. We discussed her test results, she did not want to have any further evaluation of her lumbar spine, she states "I'm not worried about having a fracture". We also discussed that tickborne illness test were sent and they would not be back tonight, her doctor's office can follow-up on that. She also states she was told she would get some medication for possible yeast infection because her "tongue is getting white".  Results for orders placed or performed during the hospital encounter of 16/10/96  Basic metabolic panel  Result Value Ref Range   Sodium 138 135 - 145 mmol/L   Potassium 3.6 3.5 - 5.1 mmol/L   Chloride 102 101 - 111 mmol/L   CO2 26 22 - 32 mmol/L   Glucose, Bld 97 65 - 99 mg/dL   BUN 15 6 - 20 mg/dL   Creatinine, Ser 0.71 0.44 - 1.00 mg/dL   Calcium 9.4 8.9 - 10.3 mg/dL   GFR calc non Af Amer >60 >60 mL/min   GFR calc Af Amer >60 >60 mL/min   Anion gap 10 5 - 15  Urinalysis, Routine w reflex microscopic  Result Value Ref Range   Color, Urine YELLOW YELLOW   APPearance CLEAR CLEAR   Specific Gravity, Urine 1.019 1.005 - 1.030   pH 5.0 5.0 - 8.0   Glucose, UA NEGATIVE NEGATIVE mg/dL   Hgb urine dipstick NEGATIVE NEGATIVE   Bilirubin Urine NEGATIVE NEGATIVE   Ketones, ur 80 (A) NEGATIVE mg/dL   Protein, ur NEGATIVE NEGATIVE mg/dL   Nitrite NEGATIVE NEGATIVE   Leukocytes, UA NEGATIVE NEGATIVE  Hepatic function panel  Result Value Ref Range   Total Protein 7.4 6.5 - 8.1 g/dL   Albumin 4.3 3.5 - 5.0 g/dL   AST 24 15 - 41 U/L   ALT 18 14 - 54 U/L   Alkaline Phosphatase 82 38 - 126 U/L   Total Bilirubin 0.9 0.3 - 1.2 mg/dL   Bilirubin, Direct 0.1 0.1 - 0.5 mg/dL   Indirect Bilirubin 0.8 0.3 - 0.9 mg/dL  CBC with Differential/Platelet  Result Value Ref Range   WBC 7.9 4.0 - 10.5 K/uL   RBC 5.30 (H) 3.87 - 5.11 MIL/uL   Hemoglobin 16.2 (H) 12.0 -  15.0 g/dL   HCT 47.1 (H) 36.0 - 46.0 %   MCV 88.9 78.0 - 100.0 fL   MCH 30.6 26.0 - 34.0 pg   MCHC 34.4 30.0 - 36.0 g/dL   RDW 14.7 11.5 - 15.5 %   Platelets 249 150 - 400 K/uL   Neutrophils Relative % 52 %   Neutro Abs 4.1 1.7 - 7.7 K/uL   Lymphocytes Relative 39 %   Lymphs Abs 3.1 0.7 - 4.0 K/uL   Monocytes Relative 8 %   Monocytes Absolute 0.6 0.1 - 1.0 K/uL   Eosinophils Relative 1 %   Eosinophils Absolute 0.1 0.0 - 0.7 K/uL   Basophils Relative 0 %   Basophils Absolute 0.0 0.0 - 0.1 K/uL  D-dimer, quantitative (not at [facility])  Result Value Ref Range   D-Dimer, Quant 5.84 (H) 0.00 - 0.50 ug/mL-FEU  Troponin I  Result Value Ref Range   Troponin I 0.03 (HH) <0.03 ng/mL  Troponin I  Result Value Ref Range   Troponin I 0.03 (HH) <0.03 ng/mL   Laboratory interpretation all normal with negative delta troponin, + ddimer  Dg Chest 2 View  Result Date: 04/13/2017 CLINICAL DATA:  Shortness of breath and chest pain with fatigue following tick bite 10 days ago. EXAM: CHEST  2 VIEW COMPARISON:  12/17/2016 and prior exams FINDINGS: The cardiomediastinal silhouette is unremarkable. There is no evidence of focal airspace disease, pulmonary edema, suspicious pulmonary nodule/mass, pleural effusion, or pneumothorax. No acute bony abnormalities are identified. IMPRESSION: No active cardiopulmonary disease. Electronically Signed   By: Margarette Canada M.D.   On: 04/13/2017 18:20   Dg Lumbar Spine Complete  Result Date: 04/13/2017 CLINICAL DATA:  Low back pain for 1-1/2 weeks EXAM: LUMBAR SPINE - COMPLETE 4+ VIEW COMPARISON:  None. FINDINGS: There is no abnormality at the right L4-L5 level and may be due to severe facet hypertrophy, as seen on prior abdominopelvic CT. The upper lumbar levels are normal. There is narrowing of the L4-L5 and L5-S1 disc spaces with associated facet arthrosis. IMPRESSION: 1. Abnormal appearance at the right aspect of the L4-L5 space. While this is probably due to severe  facet arthrosis, cross-sectional imaging is suggested for more definitive characterization and to exclude an acute fracture. 2. Normal appearance of the T12 -L3 levels. Electronically Signed   By: Ulyses Jarred M.D.   On: 04/13/2017 20:38   Ct Angio Chest Pe W Or Wo Contrast  Result Date: 04/14/2017 CLINICAL DATA:  Acute onset of generalized chest pain and fatigue. Joint aching. Bitten by tick on June 3rd. Initial encounter. EXAM: CT ANGIOGRAPHY CHEST WITH CONTRAST TECHNIQUE: Multidetector CT imaging of the chest was performed using the standard protocol during bolus administration of intravenous contrast. Multiplanar CT image reconstructions and MIPs were obtained to evaluate the vascular anatomy. CONTRAST:  100 mL of Isovue 370 IV contrast COMPARISON:  Chest radiograph performed earlier today at 6:02 p.m., and CTA of the chest performed 08/28/2016 FINDINGS: Cardiovascular:  There is no evidence of pulmonary embolus. There is no evidence of aortic dissection. There is no evidence of aneurysmal dilatation. No calcific atherosclerotic disease is seen. The great vessels are grossly unremarkable in appearance. Mediastinum/Nodes: The mediastinum is unremarkable appearance. No mediastinal lymphadenopathy is seen. No pericardial effusion is identified. The visualized portions of the thyroid gland are unremarkable. No axillary lymphadenopathy is appreciated. Lungs/Pleura: Minimal bibasilar atelectasis is noted. No pleural effusion or pneumothorax is seen. No masses are identified. Upper Abdomen: The visualized portions of the liver and spleen are grossly unremarkable. The visualized portions of the pancreas, adrenal glands and kidneys are within normal limits. Musculoskeletal: No acute osseous abnormalities are identified. The visualized musculature is unremarkable in appearance. Review of the MIP images confirms the above findings. IMPRESSION: 1. No evidence of pulmonary embolus. 2. No evidence of aortic dissection.  No evidence of aneurysmal dilatation. 3. Minimal bibasilar atelectasis noted.  Lungs otherwise clear. Electronically Signed   By: Garald Balding M.D.   On: 04/14/2017 00:31   Diagnoses that have been ruled out:  None  Diagnoses that are still under consideration:  None  Final diagnoses:  Pain  Chest pain in adult    New Prescriptions   FLUCONAZOLE (DIFLUCAN) 200 MG TABLET    Take 1 tablet (200 mg total) by mouth daily.   IBUPROFEN (ADVIL,MOTRIN) 800 MG TABLET    Take 1 tablet (800 mg total) by mouth every 8 (eight) hours as needed for moderate pain.   Plan discharge    Rolland Porter, MD 04/14/17 2505    Rolland Porter, MD 04/14/17 [phone]

## 2017-04-15 LAB — B. BURGDORFI ANTIBODIES

## 2017-04-16 LAB — ROCKY MTN SPOTTED FVR ABS PNL(IGG+IGM)
RMSF IgG: NEGATIVE
RMSF IgM: 0.42 index (ref 0.00–0.89)

## 2017-05-16 ENCOUNTER — Telehealth: Payer: Self-pay | Admitting: *Deleted

## 2017-05-16 ENCOUNTER — Encounter: Payer: Self-pay | Admitting: Family Medicine

## 2017-05-16 ENCOUNTER — Other Ambulatory Visit (HOSPITAL_COMMUNITY)
Admission: RE | Admit: 2017-05-16 | Discharge: 2017-05-16 | Disposition: A | Discharge status: Home / Self Care | Payer: Managed Care, Other (non HMO) | Source: Ambulatory Visit | Attending: Family Medicine | Admitting: Family Medicine

## 2017-05-16 ENCOUNTER — Ambulatory Visit (INDEPENDENT_AMBULATORY_CARE_PROVIDER_SITE_OTHER): Payer: Managed Care, Other (non HMO) | Admitting: Family Medicine

## 2017-05-16 ENCOUNTER — Ambulatory Visit (HOSPITAL_COMMUNITY)
Admission: RE | Admit: 2017-05-16 | Discharge: 2017-05-16 | Disposition: A | Discharge status: Home / Self Care | Payer: Managed Care, Other (non HMO) | Source: Ambulatory Visit | Attending: Family Medicine | Admitting: Family Medicine

## 2017-05-16 VITALS — BP 126/80 | HR 100 | Wt 277.0 lb

## 2017-05-16 DIAGNOSIS — R0609 Other forms of dyspnea: Secondary | ICD-10-CM

## 2017-05-16 DIAGNOSIS — R600 Localized edema: Secondary | ICD-10-CM | POA: Diagnosis not present

## 2017-05-16 DIAGNOSIS — R0602 Shortness of breath: Secondary | ICD-10-CM

## 2017-05-16 DIAGNOSIS — R7989 Other specified abnormal findings of blood chemistry: Secondary | ICD-10-CM

## 2017-05-16 DIAGNOSIS — R0600 Dyspnea, unspecified: Secondary | ICD-10-CM

## 2017-05-16 LAB — BRAIN NATRIURETIC PEPTIDE: B NATRIURETIC PEPTIDE 5: 26 pg/mL (ref 0.0–100.0)

## 2017-05-16 LAB — D-DIMER, QUANTITATIVE: D-Dimer, Quant: 4.41 ug/mL-FEU — ABNORMAL HIGH (ref 0.00–0.50)

## 2017-05-16 MED ORDER — POTASSIUM CHLORIDE CRYS ER 20 MEQ PO TBCR: 20.0000 meq | EXTENDED_RELEASE_TABLET | Freq: Every day | ORAL | 4 refills | Status: DC

## 2017-05-16 MED ORDER — FUROSEMIDE 20 MG PO TABS: 20.0000 mg | ORAL_TABLET | Freq: Every day | ORAL | 4 refills | Status: DC

## 2017-05-16 MED ORDER — ALPRAZOLAM 0.5 MG PO TABS: ORAL_TABLET | ORAL | 3 refills | Status: DC

## 2017-05-16 NOTE — Telephone Encounter (Signed)
Left message to return call to let pt know u/s negative per dr Nicki Reaper and labs are pending. Should be back tomorrow.

## 2017-05-16 NOTE — Telephone Encounter (Signed)
And also pt needs to follow up with dr Nicki Reaper next week

## 2017-05-16 NOTE — Progress Notes (Signed)
   Subjective:    Patient ID: Summer [name], female    DOB: 29-Mar-1961, 56 y.o.   MRN: [id number]  HPIRight leg swelling since last august. Shortness of breath since august but getting worse. Pt states she has stopped taking lasix.   Pt sees Dr. Bronson Ing. She states that originally they were going to do some sort of tests that was a 2 day test she could not go to sit therefore they set her up for a CAT scan of the coronary arteries but her insurance would not approve it severe for it was canceled. She states she is interested in getting further studies done regarding her heart.  Requesting refill on xanax.   On a previous echo she did have diastolic dysfunction.   Review of Systems She does relate a little bit of shortness of breath she denies chest pain denies abdominal pain denies leg pain    Objective:   Physical Exam Lungs are clear hearts regular she does have some swelling in the right lower leg her circumference in the right calf is approximately 1 inch greater than the left calf there is no tenderness present. Ankle and foot are normal except for the swelling in the ankle.       Assessment & Plan:  We will refill her Xanax  Because a swelling in the leg referral to hospital for ultrasound rule out DVT  D-dimer BNP ordered-May need echo  Some confusion in getting a test set up-patient is interested in getting further cardiac testing. She does have appointment with cardiology in August. I will forward this note as well as a message to cardiology to see if they would like to order any testing before her visit or if they just recommend for the patient to keep their visit and discuss it further on follow-up.  I believe the patient will benefit from having an echo. We will go ahead and be setting this up.  This patient has had chronic elevation of d-dimer but she is had 2 previous CAT scans of her chest which were negative for pulmonary embolus. I would not recommend another CT  Angio at this point.

## 2017-05-17 NOTE — Telephone Encounter (Signed)
Summer Drown, MD  [facility]        Please set the patient up for cardiac echo

## 2017-05-17 NOTE — Telephone Encounter (Signed)
Left message to return call 

## 2017-05-18 ENCOUNTER — Encounter: Payer: Self-pay | Admitting: Cardiovascular Disease

## 2017-05-18 NOTE — Telephone Encounter (Signed)
Patient advised that because of elevated d-dimer. Even though the ultrasound was negative it is recommended by protocol to repeat an ultrasound of the leg in 1 week with a follow-up office visit the following day. This is because some people can develop blood clots gradually and it will not show up on the first ultrasound. Certainly if having problems follow-up sooner. Patient also advised Dr Nicki Reaper recommends an ECHO. Patient verbalized understanding. Repeat venous ultrasound and ECHO scheduled at [facility].

## 2017-05-18 NOTE — Telephone Encounter (Signed)
ECHO scheduled at [facility] July 20 at 11:15am Venous U/S at [facility] July 23 at 10:15 am Patient notified of appointments and verbalized understanding

## 2017-05-19 ENCOUNTER — Other Ambulatory Visit: Payer: Self-pay | Admitting: *Deleted

## 2017-05-19 DIAGNOSIS — E876 Hypokalemia: Secondary | ICD-10-CM

## 2017-05-19 DIAGNOSIS — Z01812 Encounter for preprocedural laboratory examination: Secondary | ICD-10-CM

## 2017-05-20 ENCOUNTER — Ambulatory Visit (HOSPITAL_COMMUNITY)
Admission: RE | Admit: 2017-05-20 | Discharge: 2017-05-20 | Disposition: A | Discharge status: Home / Self Care | Payer: Managed Care, Other (non HMO) | Source: Ambulatory Visit | Attending: Family Medicine | Admitting: Family Medicine

## 2017-05-20 ENCOUNTER — Other Ambulatory Visit (HOSPITAL_COMMUNITY)
Admission: RE | Admit: 2017-05-20 | Discharge: 2017-05-20 | Disposition: A | Discharge status: Home / Self Care | Payer: Managed Care, Other (non HMO) | Source: Ambulatory Visit | Attending: Cardiovascular Disease | Admitting: Cardiovascular Disease

## 2017-05-20 DIAGNOSIS — R0602 Shortness of breath: Secondary | ICD-10-CM | POA: Insufficient documentation

## 2017-05-20 DIAGNOSIS — R7303 Prediabetes: Secondary | ICD-10-CM | POA: Diagnosis not present

## 2017-05-20 DIAGNOSIS — E876 Hypokalemia: Secondary | ICD-10-CM | POA: Insufficient documentation

## 2017-05-20 DIAGNOSIS — Z01812 Encounter for preprocedural laboratory examination: Secondary | ICD-10-CM | POA: Diagnosis present

## 2017-05-20 DIAGNOSIS — R7989 Other specified abnormal findings of blood chemistry: Secondary | ICD-10-CM | POA: Insufficient documentation

## 2017-05-20 DIAGNOSIS — Z6841 Body Mass Index (BMI) 40.0 and over, adult: Secondary | ICD-10-CM | POA: Insufficient documentation

## 2017-05-20 LAB — ECHOCARDIOGRAM COMPLETE
CHL CUP RV SYS PRESS: 19 mmHg
E decel time: 271 msec
EERAT: 7.02
FS: 41 % (ref 28–44)
IVS/LV PW RATIO, ED: 1.03
LA diam end sys: 34 mm
LA diam index: 1.38 cm/m2
LA vol A4C: 54.6 ml
LASIZE: 34 mm
LAVOL: 45.7 mL
LAVOLIN: 18.5 mL/m2
LV E/e' medial: 7.02
LV sys vol: 26 mL
LVDIAVOL: 72 mL (ref 46–106)
LVDIAVOLIN: 29 mL/m2
LVEEAVG: 7.02
LVELAT: 9.79 cm/s
LVOT VTI: 24.6 cm
LVOT area: 2.84 cm2
LVOT diameter: 19 mm
LVOT peak grad rest: 6 mmHg
LVOT peak vel: 127 cm/s
LVOTSV: 70 mL
LVSYSVOLIN: 11 mL/m2
MV Dec: 271
MV pk A vel: 92.5 m/s
MVPKEVEL: 68.7 m/s
PW: 9.81 mm — AB (ref 0.6–1.1)
Reg peak vel: 198 cm/s
Simpson's disk: 64
Stroke v: 46 ml
TAPSE: 16.6 mm
TDI e' lateral: 9.79
TDI e' medial: 6.85
TRMAXVEL: 198 cm/s

## 2017-05-20 LAB — BASIC METABOLIC PANEL WITH GFR
Anion gap: 11 (ref 5–15)
BUN: 15 mg/dL (ref 6–20)
CO2: 25 mmol/L (ref 22–32)
Calcium: 9.4 mg/dL (ref 8.9–10.3)
Chloride: 105 mmol/L (ref 101–111)
Creatinine, Ser: 0.74 mg/dL (ref 0.44–1.00)
GFR calc Af Amer: >60 mL/min (ref >60)
GFR calc non Af Amer: >60 mL/min (ref >60)
Glucose, Bld: 105 mg/dL — ABNORMAL HIGH (ref 65–99)
Potassium: 3.6 mmol/L (ref 3.5–5.1)
Sodium: 141 mmol/L (ref 135–145)

## 2017-05-20 NOTE — Progress Notes (Signed)
*  PRELIMINARY RESULTS* Echocardiogram 2D Echocardiogram has been performed.  Samuel Germany 05/20/2017, 11:59 AM

## 2017-05-23 ENCOUNTER — Ambulatory Visit (HOSPITAL_COMMUNITY)
Admission: RE | Admit: 2017-05-23 | Discharge: 2017-05-23 | Disposition: A | Discharge status: Home / Self Care | Payer: Managed Care, Other (non HMO) | Source: Ambulatory Visit | Attending: Family Medicine | Admitting: Family Medicine

## 2017-05-23 DIAGNOSIS — R7989 Other specified abnormal findings of blood chemistry: Secondary | ICD-10-CM | POA: Insufficient documentation

## 2017-05-24 ENCOUNTER — Other Ambulatory Visit: Payer: Self-pay

## 2017-05-24 MED ORDER — FUROSEMIDE 20 MG PO TABS: ORAL_TABLET | ORAL | 3 refills | Status: DC

## 2017-05-24 NOTE — Addendum Note (Signed)
Addended by: Launa Grill on: 05/24/2017 02:21 PM   Modules accepted: Orders

## 2017-05-25 ENCOUNTER — Telehealth: Payer: Self-pay | Admitting: Family Medicine

## 2017-05-25 NOTE — Telephone Encounter (Signed)
Please let the patient know her echo shows good ejection fraction. Does show mild diastolic dysfunction-this is essentially an issue where the heart muscle does not relax enough-being on a diuretic is the proper choice. It would be wise for the patient to keep her follow-up visits with cardiology.(They have her set for coronary artery CT scan next week and a follow-up visit in the middle of August) if everything checks out well there and patient still has shortness of breath and then we will pursue pulmonary testing

## 2017-05-25 NOTE — Telephone Encounter (Signed)
Patient said that she just got off the phone with her heart doctor and was told the results to her echo were in.  They told her that they couldn't tell her the results and she would have to call our office.  Please advise.

## 2017-05-26 NOTE — Telephone Encounter (Signed)
Spoke with patient and informed her per Dr.Scott Luking- her echo shows good ejection fraction. Does show mild diastolic dysfunction-this is essentially an issue where the heart muscle does not relax enough-being on a diuretic is the proper choice. It would be wise for the patient to keep her follow-up visits with cardiology.(They have her set for coronary artery CT scan next week and a follow-up visit in the middle of August) if everything checks out well there and patient still has shortness of breath and then we will pursue pulmonary testing. Patient verbalized understanding.

## 2017-05-31 ENCOUNTER — Other Ambulatory Visit: Payer: Self-pay | Admitting: Family Medicine

## 2017-05-31 DIAGNOSIS — E041 Nontoxic single thyroid nodule: Secondary | ICD-10-CM

## 2017-06-01 ENCOUNTER — Ambulatory Visit (HOSPITAL_COMMUNITY)
Admission: RE | Admit: 2017-06-01 | Discharge: 2017-06-01 | Disposition: A | Discharge status: Home / Self Care | Payer: Managed Care, Other (non HMO) | Source: Ambulatory Visit | Attending: Cardiovascular Disease | Admitting: Cardiovascular Disease

## 2017-06-01 DIAGNOSIS — R0602 Shortness of breath: Secondary | ICD-10-CM | POA: Insufficient documentation

## 2017-06-01 DIAGNOSIS — R079 Chest pain, unspecified: Secondary | ICD-10-CM | POA: Diagnosis not present

## 2017-06-01 DIAGNOSIS — Z9289 Personal history of other medical treatment: Secondary | ICD-10-CM

## 2017-06-01 HISTORY — DX: Personal history of other medical treatment: Z92.89

## 2017-06-01 MED ORDER — NITROGLYCERIN 0.4 MG SL SUBL
SUBLINGUAL_TABLET | SUBLINGUAL | Status: AC
  Filled 2017-06-01: qty 2

## 2017-06-01 MED ORDER — IOPAMIDOL (ISOVUE-370) INJECTION 76%
INTRAVENOUS | Status: AC
  Administered 2017-06-01: 100 mL
  Filled 2017-06-01: qty 100

## 2017-06-01 MED ORDER — METOPROLOL TARTRATE 5 MG/5ML IV SOLN
5.0000 mg | INTRAVENOUS | Status: DC | PRN
  Administered 2017-06-01 (×3): 5 mg via INTRAVENOUS

## 2017-06-01 MED ORDER — METOPROLOL TARTRATE 5 MG/5ML IV SOLN
INTRAVENOUS | Status: AC
  Filled 2017-06-01: qty 15

## 2017-06-01 MED ORDER — NITROGLYCERIN 0.4 MG SL SUBL
0.8000 mg | SUBLINGUAL_TABLET | SUBLINGUAL | Status: DC | PRN
  Administered 2017-06-01: 0.8 mg via SUBLINGUAL

## 2017-06-07 ENCOUNTER — Telehealth: Payer: Self-pay | Admitting: Family Medicine

## 2017-06-07 NOTE — Telephone Encounter (Signed)
Patient said she is needing an updated Rx for Lasix and Potassium to [facility].  She said they have told her that it is too early to have these filled and she believes its because they need updated Rx.  She said at some point she was called by our office and told to start taking 2, instead of the 1 she was prescribed.

## 2017-06-07 NOTE — Telephone Encounter (Signed)
Pharm still had old script for one a day. Called pharm and had that script canceled. Pt can pick up script now with new directions 2 a day. Pt notified.

## 2017-06-13 ENCOUNTER — Emergency Department (HOSPITAL_COMMUNITY): Payer: Managed Care, Other (non HMO)

## 2017-06-13 ENCOUNTER — Encounter (HOSPITAL_COMMUNITY): Payer: Self-pay

## 2017-06-13 ENCOUNTER — Emergency Department (HOSPITAL_COMMUNITY)
Admission: EM | Admit: 2017-06-13 | Discharge: 2017-06-14 | Disposition: A | Discharge status: Home / Self Care | Payer: Managed Care, Other (non HMO) | Attending: Emergency Medicine | Admitting: Emergency Medicine

## 2017-06-13 DIAGNOSIS — E876 Hypokalemia: Secondary | ICD-10-CM | POA: Diagnosis not present

## 2017-06-13 DIAGNOSIS — E039 Hypothyroidism, unspecified: Secondary | ICD-10-CM | POA: Diagnosis not present

## 2017-06-13 DIAGNOSIS — R0602 Shortness of breath: Secondary | ICD-10-CM

## 2017-06-13 DIAGNOSIS — R079 Chest pain, unspecified: Secondary | ICD-10-CM | POA: Diagnosis present

## 2017-06-13 DIAGNOSIS — Z87891 Personal history of nicotine dependence: Secondary | ICD-10-CM | POA: Diagnosis not present

## 2017-06-13 DIAGNOSIS — I110 Hypertensive heart disease with heart failure: Secondary | ICD-10-CM | POA: Insufficient documentation

## 2017-06-13 DIAGNOSIS — R0789 Other chest pain: Secondary | ICD-10-CM | POA: Insufficient documentation

## 2017-06-13 DIAGNOSIS — I509 Heart failure, unspecified: Secondary | ICD-10-CM | POA: Insufficient documentation

## 2017-06-13 DIAGNOSIS — Z79899 Other long term (current) drug therapy: Secondary | ICD-10-CM | POA: Insufficient documentation

## 2017-06-13 DIAGNOSIS — Z7982 Long term (current) use of aspirin: Secondary | ICD-10-CM | POA: Diagnosis not present

## 2017-06-13 HISTORY — DX: Heart failure, unspecified: I50.9

## 2017-06-13 LAB — CBC WITH DIFFERENTIAL/PLATELET
BASOS ABS: 0 10*3/uL (ref 0.0–0.1)
Basophils Relative: 0 %
Eosinophils Absolute: 0.2 10*3/uL (ref 0.0–0.7)
Eosinophils Relative: 2 %
HEMATOCRIT: 43.3 % (ref 36.0–46.0)
Hemoglobin: 14.8 g/dL (ref 12.0–15.0)
Lymphocytes Relative: 26 %
Lymphs Abs: 2.2 10*3/uL (ref 0.7–4.0)
MCH: 30.3 pg (ref 26.0–34.0)
MCHC: 34.2 g/dL (ref 30.0–36.0)
MCV: 88.5 fL (ref 78.0–100.0)
MONOS PCT: 9 %
Monocytes Absolute: 0.7 10*3/uL (ref 0.1–1.0)
NEUTROS ABS: 5.2 10*3/uL (ref 1.7–7.7)
Neutrophils Relative %: 63 %
Platelets: 212 10*3/uL (ref 150–400)
RBC: 4.89 MIL/uL (ref 3.87–5.11)
RDW: 15.4 % (ref 11.5–15.5)
WBC: 8.3 10*3/uL (ref 4.0–10.5)

## 2017-06-13 LAB — BASIC METABOLIC PANEL
Anion gap: 10 (ref 5–15)
BUN: 22 mg/dL — AB (ref 6–20)
CALCIUM: 9.5 mg/dL (ref 8.9–10.3)
CO2: 25 mmol/L (ref 22–32)
Chloride: 104 mmol/L (ref 101–111)
Creatinine, Ser: 0.87 mg/dL (ref 0.44–1.00)
GFR calc non Af Amer: >60 mL/min (ref >60)
GLUCOSE: 107 mg/dL — AB (ref 65–99)
Potassium: 3.2 mmol/L — ABNORMAL LOW (ref 3.5–5.1)
Sodium: 139 mmol/L (ref 135–145)

## 2017-06-13 LAB — TROPONIN I: Troponin I: <0.03 ng/mL (ref <0.03)

## 2017-06-13 MED ORDER — NITROGLYCERIN 2 % TD OINT
1.0000 [in_us] | TOPICAL_OINTMENT | Freq: Once | TRANSDERMAL | Status: AC
  Administered 2017-06-13: 1 [in_us] via TOPICAL
  Filled 2017-06-13: qty 1

## 2017-06-13 NOTE — ED Triage Notes (Signed)
Reports of mid chest pain x2 weeks and shortness of breath that started yesterday. Also has productive cough, denies fever.

## 2017-06-14 MED ORDER — FUROSEMIDE 10 MG/ML IJ SOLN
40.0000 mg | Freq: Once | INTRAMUSCULAR | Status: AC
  Administered 2017-06-14: 40 mg via INTRAMUSCULAR
  Filled 2017-06-14: qty 4

## 2017-06-14 MED ORDER — POTASSIUM CHLORIDE CRYS ER 20 MEQ PO TBCR
40.0000 meq | EXTENDED_RELEASE_TABLET | Freq: Once | ORAL | Status: AC
  Administered 2017-06-14: 40 meq via ORAL
  Filled 2017-06-14: qty 2

## 2017-06-14 NOTE — ED Notes (Signed)
O2 low of 96% while ambulating

## 2017-06-14 NOTE — Discharge Instructions (Signed)
You were seen today for chest pain or shortness of breath. This may be related to mild volume overload. You have recently had extensive workup to include coronary CT which has been reassuring. Take your potassium and Lasix as directed. Your given one additional dose of Lasix here in the emergency room. If you have any new or worsening symptoms you should be reevaluated.

## 2017-06-14 NOTE — ED Provider Notes (Signed)
[facility] DEPT Provider Note   CSN: [id number] Arrival date & time: 06/13/17  2144     History   Chief Complaint Chief Complaint  Patient presents with  . Chest Pain  . Shortness of Breath    HPI Summer [name] is a 56 y.o. female.  HPI  This a 55 year old female with a history of CHF, hypertension, thyroid disease who presents with chest pain and shortness of breath. Patient reports 2 day history of constant chest pain and "fluttering in my chest." She reports shortness of breath. Recent echocardiogram was obtained and patient had a normal systolic function but noted to have diastolic dysfunction. She is placed on Lasix. She reports increasing lower extremity swelling. No recent fevers. She has had a cough. Currently she rates her pain a 3 out of 10. It is pressure-like and anterior. No history of coronary artery disease.  Of note, patient is followed closely as an outpatient by Dr. Wolfgang Phoenix and Dr. Bobette Mo. She has recently had a coronary CT with a coronary score of 0. She also recently had an echocardiogram that showed grade 1 diastolic dysfunction.  Past Medical History:  Diagnosis Date  . Arthritis   . Bell's palsy   . CHF (congestive heart failure) ([place])   . Dysrhythmia, cardiac   . Fibromyalgia   . Hypertension   . Thyroid disease     Patient Active Problem List   Diagnosis Date Noted  . Palpitations 10/09/2016  . Prediabetes 06/10/2014  . Subclinical hypothyroidism 06/10/2014  . Abdominal pain, other specified site 12/26/2013  . Esophageal reflux 04/01/2013  . Fibromyalgia 03/22/2013  . Hypokalemia 03/22/2013  . Facial paralysis/Bells palsy 02/01/2013    Past Surgical History:  Procedure Laterality Date  . COLONOSCOPY N/A 01/23/2014   Procedure: COLONOSCOPY;  Surgeon: Rogene Houston, MD;  Location: [facility];  Service: Endoscopy;  Laterality: N/A;  200  . TONSILLECTOMY      OB History    No data available       Home Medications     Prior to Admission medications   Medication Sig Start Date End Date Taking? Authorizing Provider  Ascorbic Acid (VITAMIN C) POWD Take 5 mLs by mouth 2 (two) times daily.   Yes [provider]  aspirin EC 81 MG tablet Take 81 mg by mouth daily.   Yes [provider]  Cholecalciferol (VITAMIN D-3) 5000 UNITS TABS Take 1 tablet by mouth daily.   Yes [provider]  doxycycline (VIBRA-TABS) 100 MG tablet Take 1 tablet (100 mg total) by mouth 2 (two) times daily. 04/08/17  Yes Nilda Simmer, NP  fluconazole (DIFLUCAN) 200 MG tablet Take 200 mg by mouth daily. 06/09/17  Yes [provider]  Folic Acid-Vit P5-TIR W43 (HOMOCYSTEINE FORMULA) 0.8-50-0.1 MG TABS Take 1 tablet by mouth daily.   Yes [provider]  furosemide (LASIX) 20 MG tablet Take 2 tablets by mouth every morning. 05/24/17  Yes Luking, Elayne Snare, MD  Iodine CRYS Take 125 mcg by mouth daily. Mixed in water and drink daily   Yes [provider]  Magnesium 200 MG TABS Take 2 tablets by mouth 2 (two) times daily.    Yes [provider]  Multiple Vitamins-Minerals (CLINICAL NUTRIENTS FOR WOMEN PO) Take 2 capsules by mouth 2 (two) times daily.   Yes [provider]  NALTREXONE HCL PO Take 1 tablet by mouth at bedtime.   Yes [provider]  potassium chloride SA (K-DUR,KLOR-CON) 20 MEQ tablet  Take 1 tablet (20 mEq total) by mouth daily. 05/16/17  Yes Kathyrn Drown, MD  thyroid (ARMOUR THYROID) 60 MG tablet Take 1 tablet (60 mg total) by mouth daily before breakfast. 12/10/16  Yes Luking, Elayne Snare, MD  tinidazole (TINDAMAX) 500 MG tablet Take 500 mg by mouth 3 (three) times a week. 3 times a week    Yes [provider]    Family History Family History  Problem Relation Age of Onset  . Adopted: Yes  . Cancer Mother        lung    Social History Social History  Substance Use Topics  . Smoking status: Former Smoker    Types: Cigarettes    Quit  date: 11/08/2003  . Smokeless tobacco: Never Used     Comment: quit smoking 10 yrs   . Alcohol use No     Allergies   Avelox [moxifloxacin hcl in nacl]; Bee venom; and Shellfish allergy   Review of Systems Review of Systems  Constitutional: Negative for fever.  Respiratory: Positive for cough and shortness of breath.   Cardiovascular: Positive for chest pain and leg swelling.  Gastrointestinal: Negative for abdominal pain, nausea and vomiting.  Genitourinary: Negative for dysuria.  All other systems reviewed and are negative.    Physical Exam Updated Vital Signs BP 127/73 (BP Location: Right Arm)   Pulse 83   Temp 98.3 F (36.8 C) (Oral)   Resp 18   Ht 5\' 5"  (1.651 m)   Wt 121.1 kg (267 lb)   LMP 01/23/2014 Comment: spotting  SpO2 97%   BMI 44.43 kg/m   Physical Exam  Constitutional: She is oriented to person, place, and time. She appears well-developed and well-nourished.  Obese  HENT:  Head: Normocephalic and atraumatic.  Cardiovascular: Normal rate, regular rhythm and normal heart sounds.   No murmur heard. Pulmonary/Chest: Effort normal and breath sounds normal. No respiratory distress. She has no wheezes. She exhibits no tenderness.  Abdominal: Soft. Bowel sounds are normal. There is no tenderness.  Musculoskeletal: She exhibits edema.  1+ bilateral lower extremity edema  Neurological: She is alert and oriented to person, place, and time.  Skin: Skin is warm and dry.  Psychiatric: She has a normal mood and affect.  Nursing note and vitals reviewed.    ED Treatments / Results  Labs (all labs ordered are listed, but only abnormal results are displayed) Labs Reviewed  BASIC METABOLIC PANEL - Abnormal; Notable for the following:       Result Value   Potassium 3.2 (*)    Glucose, Bld 107 (*)    BUN 22 (*)    All other components within normal limits  CBC WITH DIFFERENTIAL/PLATELET  TROPONIN I    EKG  EKG Interpretation  Date/Time:  Monday June 13 2017 22:04:24 EDT Ventricular Rate:  83 PR Interval:    QRS Duration: 82 QT Interval:  375 QTC Calculation: 441 R Axis:   11 Text Interpretation:  Sinus rhythm Low voltage, precordial leads Baseline wander in lead(s) V6 improved rate, improved TWI in III but  no other changes since 04/13/17 Confirmed by Merrily Pew [phone]) on 06/13/2017 10:12:55 PM       Radiology Dg Chest 2 View  Result Date: 06/14/2017 CLINICAL DATA:  Mid chest pain EXAM: CHEST  2 VIEW COMPARISON:  04/03/2017 FINDINGS: The heart size and mediastinal contours are within normal limits. Both lungs are clear. The visualized skeletal structures are unremarkable. IMPRESSION: No active cardiopulmonary disease. Electronically  Signed   By: Donavan Foil M.D.   On: 06/14/2017 00:35    Procedures Procedures (including critical care time)  Medications Ordered in ED Medications  nitroGLYCERIN (NITROGLYN) 2 % ointment 1 inch (1 inch Topical Given 06/13/17 2342)  furosemide (LASIX) injection 40 mg (40 mg Intramuscular Given 06/14/17 0152)  potassium chloride SA (K-DUR,KLOR-CON) CR tablet 40 mEq (40 mEq Oral Given 06/14/17 0152)     Initial Impression / Assessment and Plan / ED Course  I have reviewed the triage vital signs and the nursing notes.  Pertinent labs & imaging results that were available during my care of the patient were reviewed by me and considered in my medical decision making (see chart for details).     Patient presents with 2 day history of worsening shortness of breath and some chest tightness. She is nontoxic on exam. Vital signs reassuring. She does appear mildly volume overloaded. She has recently had a fairly extensive workup to include echo and coronary CT which have been largely reassuring. She was started on Lasix. She is not hypoxic. EKG, troponin, chest x-ray obtained. All reassuring. Given that she appears mildly volume overloaded on exam, patient was given IM Lasix. Given the duration of her  symptoms, feel 1 troponin at this time is reassuring. She will need close follow-up with Dr. Bobette Mo.  She should continue Lasix and potassium at home as directed. She states that she has follow-up on Friday.  After history, exam, and medical workup I feel the patient has been appropriately medically screened and is safe for discharge home. Pertinent diagnoses were discussed with the patient. Patient was given return precautions.   Final Clinical Impressions(s) / ED Diagnoses   Final diagnoses:  Shortness of breath  Atypical chest pain  Hypokalemia    New Prescriptions New Prescriptions   No medications on file     Merryl Hacker, MD 06/14/17 0201

## 2017-06-17 ENCOUNTER — Encounter: Payer: Self-pay | Admitting: Adult Health

## 2017-06-17 ENCOUNTER — Ambulatory Visit (INDEPENDENT_AMBULATORY_CARE_PROVIDER_SITE_OTHER): Payer: Managed Care, Other (non HMO) | Admitting: Adult Health

## 2017-06-17 VITALS — BP 128/76 | HR 92 | Ht 65.0 in | Wt 276.0 lb

## 2017-06-17 DIAGNOSIS — I5032 Chronic diastolic (congestive) heart failure: Secondary | ICD-10-CM | POA: Diagnosis not present

## 2017-06-17 DIAGNOSIS — R002 Palpitations: Secondary | ICD-10-CM

## 2017-06-17 DIAGNOSIS — I10 Essential (primary) hypertension: Secondary | ICD-10-CM | POA: Diagnosis not present

## 2017-06-17 NOTE — Patient Instructions (Signed)
Medication Instructions:  Your physician recommends that you continue on your current medications as directed. Please refer to the Current Medication list given to you today.   Labwork: NONE  Testing/Procedures: NONE  Follow-Up: Your physician wants you to follow-up in: 9 Months . You will receive a reminder letter in the mail two months in advance. If you don't receive a letter, please call our office to schedule the follow-up appointment.    Any Other Special Instructions Will Be Listed Below (If Applicable).     If you need a refill on your cardiac medications before your next appointment, please call your pharmacy.

## 2017-06-17 NOTE — Progress Notes (Signed)
Cardiology Office Note   Date:  06/17/2017   ID:  Summer [name], DOB Jan 21, 1961, MRN [id number]  PCP:  Kathyrn Drown, MD  Cardiologist:  Bronson Ing Chief Complaint  Patient presents with  . Chest Pain  . Palpitations      History of Present Illness: Summer [name] is a 56 y.o. female who presents for ongoing assessment and management of tachycardia, dyspnea on exertion,chronic chest pain. The patient had a coronary CT scan on 06/01/2017, this did not find any cardiac calcifications. Coronary calcium score of zero. The patient was seen in the emergency room on 06/14/2017 for recurrent chest pain and shortness of breath along with palpitations. She is found to have mild volume  overload. She was given Lasix. It was advised to continue taking Lasix daily.  She is here today with continued anxiety over her physical health. She states no one is explain her heart function to her and she is worried about a lot. She feels frequent palpitations that she thinks it's related to her worry. She is walking in her home 20 minutes a day. She is on a low-carb no sugar diet. Has lost 4 pounds this week. She is apparently motivated to lose weight in order to better. She awakens at night several times because she doesn't sleep well, and sleeps in a chair as opposed to a bed for that reason. She is worried about her heart causing her problems when she lies down.  Past Medical History:  Diagnosis Date  . Arthritis   . Bell's palsy   . CHF (congestive heart failure) ([place])   . Dysrhythmia, cardiac   . Fibromyalgia   . Hypertension   . Thyroid disease     Past Surgical History:  Procedure Laterality Date  . COLONOSCOPY N/A 01/23/2014   Procedure: COLONOSCOPY;  Surgeon: Rogene Houston, MD;  Location: [facility];  Service: Endoscopy;  Laterality: N/A;  200  . TONSILLECTOMY       Current Outpatient Prescriptions  Medication Sig Dispense Refill  . Ascorbic Acid (VITAMIN C) POWD Take 5 mLs by mouth  2 (two) times daily.    Marland Kitchen aspirin EC 81 MG tablet Take 81 mg by mouth daily.    . Cholecalciferol (VITAMIN D-3) 5000 UNITS TABS Take 1 tablet by mouth daily.    Marland Kitchen doxycycline (VIBRA-TABS) 100 MG tablet Take 1 tablet (100 mg total) by mouth 2 (two) times daily. 42 tablet 0  . fluconazole (DIFLUCAN) 200 MG tablet Take 200 mg by mouth daily.    . Folic Acid-Vit E1-DEY C14 (HOMOCYSTEINE FORMULA) 0.8-50-0.1 MG TABS Take 1 tablet by mouth daily.    . furosemide (LASIX) 20 MG tablet Take 2 tablets by mouth every morning. 60 tablet 3  . Iodine CRYS Take 125 mcg by mouth daily. Mixed in water and drink daily    . Magnesium 200 MG TABS Take 2 tablets by mouth 2 (two) times daily.     . Multiple Vitamins-Minerals (CLINICAL NUTRIENTS FOR WOMEN PO) Take 2 capsules by mouth 2 (two) times daily.    Marland Kitchen NALTREXONE HCL PO Take 1 tablet by mouth at bedtime.    . potassium chloride SA (K-DUR,KLOR-CON) 20 MEQ tablet Take 1 tablet (20 mEq total) by mouth daily. 30 tablet 4  . thyroid (ARMOUR THYROID) 60 MG tablet Take 1 tablet (60 mg total) by mouth daily before breakfast. 30 tablet 6  . tinidazole (TINDAMAX) 500 MG tablet Take 500 mg by mouth 3 (three) times a  week. 3 times a week      No current facility-administered medications for this visit.     Allergies:   Avelox [moxifloxacin hcl in nacl]; Bee venom; and Shellfish allergy    Social History:  The patient  reports that she quit smoking about 13 years ago. Her smoking use included Cigarettes. She has never used smokeless tobacco. She reports that she does not drink alcohol or use drugs.   Family History:  The patient's family history includes Cancer in her mother. She was adopted.    ROS: All other systems are reviewed and negative. Unless otherwise mentioned in H&P    PHYSICAL EXAM: VS:  LMP 01/23/2014 Comment: spotting , BMI There is no height or weight on file to calculate BMI. GEN: Well nourished, well developed, in no acute distress Morbidly obese.   HEENT: normal  Neck: no JVD, carotid bruits, or masses Cardiac: RRR; occasional extra systole, no murmurs, rubs, or gallops,no edema  Respiratory:  clear to auscultation bilaterally, normal work of breathing GI: soft, nontender, nondistended, + BS MS: no deformity or atrophy  Skin: warm and dry, no rash Neuro:  Strength and sensation are intact Psych: euthymic mood, full affect   Recent Labs: 10/07/2016: TSH 2.59 04/13/2017: ALT 18 05/16/2017: B Natriuretic Peptide 26.0 06/13/2017: BUN 22; Creatinine, Ser 0.87; Hemoglobin 14.8; Platelets 212; Potassium 3.2; Sodium 139    Lipid Panel    Component Value Date/Time   CHOL 163 10/07/2016 1537   TRIG 112 10/07/2016 1537   HDL 54 10/07/2016 1537   CHOLHDL 3.0 10/07/2016 1537   VLDL 22 10/07/2016 1537   LDLCALC 87 10/07/2016 1537      Wt Readings from Last 3 Encounters:  06/13/17 267 lb (121.1 kg)  05/16/17 277 lb (125.6 kg)  04/13/17 273 lb (123.8 kg)      Other studies Reviewed: Echocardiogram 06-04-2017 Left ventricle: The cavity size was normal. Wall thickness was   normal. Systolic function was normal. The estimated ejection   fraction was in the range of 60% to 65%. Doppler parameters are   consistent with abnormal left ventricular relaxation (grade 1   diastolic dysfunction).  ASSESSMENT AND PLAN:  1.  Frequent palpitations: She was hypokalemic on recent ER visit has been given potassium and magnesium replacement. She states she still feels palpitations but they occur when she's worried her anxious. I've explained her heart function to her and gone over all of her test results. She states she feels much better knowing her test results in more detail. I've given her a copy of her recent echocardiogram. I've also explained that her CT scan was negative for significant coronary artery disease with a score of zero.  2. Hypertension: She is on Lasix 20 mg every morning, which is the only medication that would be affecting her  blood pressure. She is on potassium and magnesium replacement. There is no evidence of fluid retention. She is advised on continuing her low sodium low cholesterol calorie restricted diet.  3. Morbid obesity: There are some concerns of sleep apnea due to her body habitus but she would like to wait to have this test until she loses weight. She is continuing to increase her exercise by walking inside her home for 20 minutes at a time where she sets a timer.. I'm hopeful that this will continue concerning increased activity.      Current edicines are reviewed at length with the patient today.    Labs/ tests ordered today include:  Curt Bears  Brooks Sailors DNP, ANP, AACC   06/17/2017 12:51 PM    [facility] [address], [place], [place] [address] Phone: [phone]; Fax: [phone]

## 2017-07-06 ENCOUNTER — Ambulatory Visit (HOSPITAL_COMMUNITY)
Admission: RE | Admit: 2017-07-06 | Discharge: 2017-07-06 | Disposition: A | Discharge status: Home / Self Care | Payer: Managed Care, Other (non HMO) | Source: Ambulatory Visit | Attending: Cardiovascular Disease | Admitting: Cardiovascular Disease

## 2017-07-06 DIAGNOSIS — R0602 Shortness of breath: Secondary | ICD-10-CM | POA: Diagnosis present

## 2017-07-06 DIAGNOSIS — R079 Chest pain, unspecified: Secondary | ICD-10-CM | POA: Insufficient documentation

## 2017-07-08 ENCOUNTER — Telehealth: Payer: Self-pay | Admitting: *Deleted

## 2017-07-08 NOTE — Telephone Encounter (Signed)
Called patient with test results. No answer. Left message to call back.  

## 2017-07-08 NOTE — Telephone Encounter (Signed)
-----   Message from Herminio Commons, MD sent at 07/08/2017  1:46 PM EDT ----- Normal. No blockages.

## 2017-07-11 ENCOUNTER — Telehealth: Payer: Self-pay

## 2017-07-11 NOTE — Telephone Encounter (Signed)
Called pt., no answer. Left message for pt to return call.  

## 2017-07-11 NOTE — Telephone Encounter (Signed)
-----   Message from Herminio Commons, MD sent at 07/08/2017  1:46 PM EDT ----- Normal. No blockages.

## 2017-07-12 ENCOUNTER — Telehealth: Payer: Self-pay

## 2017-07-12 NOTE — Telephone Encounter (Signed)
No answer, left message for pt to return call. 

## 2017-07-12 NOTE — Telephone Encounter (Signed)
-----   Message from Herminio Commons, MD sent at 07/08/2017  1:46 PM EDT ----- Normal. No blockages.

## 2017-10-08 ENCOUNTER — Emergency Department (HOSPITAL_COMMUNITY)
Admission: EM | Admit: 2017-10-08 | Discharge: 2017-10-09 | Disposition: A | Discharge status: Home / Self Care | Payer: Managed Care, Other (non HMO) | Attending: Vascular Surgery | Admitting: Vascular Surgery

## 2017-10-08 ENCOUNTER — Emergency Department (HOSPITAL_COMMUNITY): Payer: Managed Care, Other (non HMO)

## 2017-10-08 ENCOUNTER — Encounter (HOSPITAL_COMMUNITY): Payer: Self-pay | Admitting: Emergency Medicine

## 2017-10-08 DIAGNOSIS — I509 Heart failure, unspecified: Secondary | ICD-10-CM | POA: Insufficient documentation

## 2017-10-08 DIAGNOSIS — I110 Hypertensive heart disease with heart failure: Secondary | ICD-10-CM | POA: Insufficient documentation

## 2017-10-08 DIAGNOSIS — Z7982 Long term (current) use of aspirin: Secondary | ICD-10-CM | POA: Diagnosis not present

## 2017-10-08 DIAGNOSIS — Z79899 Other long term (current) drug therapy: Secondary | ICD-10-CM | POA: Diagnosis not present

## 2017-10-08 DIAGNOSIS — E039 Hypothyroidism, unspecified: Secondary | ICD-10-CM | POA: Diagnosis not present

## 2017-10-08 DIAGNOSIS — G510 Bell's palsy: Secondary | ICD-10-CM | POA: Diagnosis not present

## 2017-10-08 DIAGNOSIS — M79604 Pain in right leg: Secondary | ICD-10-CM | POA: Insufficient documentation

## 2017-10-08 HISTORY — DX: Personal history of other medical treatment: Z92.89

## 2017-10-08 LAB — COMPREHENSIVE METABOLIC PANEL
ALT: 18 U/L (ref 14–54)
AST: 21 U/L (ref 15–41)
Albumin: 4.2 g/dL (ref 3.5–5.0)
Alkaline Phosphatase: 97 U/L (ref 38–126)
Anion gap: 7 (ref 5–15)
BUN: 26 mg/dL — ABNORMAL HIGH (ref 6–20)
CO2: 25 mmol/L (ref 22–32)
Calcium: 9.3 mg/dL (ref 8.9–10.3)
Chloride: 109 mmol/L (ref 101–111)
Creatinine, Ser: 0.64 mg/dL (ref 0.44–1.00)
GFR calc Af Amer: >60 mL/min (ref >60)
GFR calc non Af Amer: >60 mL/min (ref >60)
Glucose, Bld: 107 mg/dL — ABNORMAL HIGH (ref 65–99)
Potassium: 3.7 mmol/L (ref 3.5–5.1)
Sodium: 141 mmol/L (ref 135–145)
Total Bilirubin: 0.7 mg/dL (ref 0.3–1.2)
Total Protein: 7.5 g/dL (ref 6.5–8.1)

## 2017-10-08 LAB — CBC WITH DIFFERENTIAL/PLATELET
Basophils Absolute: 0.1 10*3/uL (ref 0.0–0.1)
Basophils Relative: 1 %
Eosinophils Absolute: 0.3 10*3/uL (ref 0.0–0.7)
Eosinophils Relative: 3 %
HCT: 47.6 % — ABNORMAL HIGH (ref 36.0–46.0)
Hemoglobin: 15.6 g/dL — ABNORMAL HIGH (ref 12.0–15.0)
Lymphocytes Relative: 40 %
Lymphs Abs: 3.6 10*3/uL (ref 0.7–4.0)
MCH: 29.9 pg (ref 26.0–34.0)
MCHC: 32.8 g/dL (ref 30.0–36.0)
MCV: 91.2 fL (ref 78.0–100.0)
Monocytes Absolute: 0.8 10*3/uL (ref 0.1–1.0)
Monocytes Relative: 9 %
Neutro Abs: 4.3 10*3/uL (ref 1.7–7.7)
Neutrophils Relative %: 47 %
Platelets: 249 10*3/uL (ref 150–400)
RBC: 5.22 MIL/uL — ABNORMAL HIGH (ref 3.87–5.11)
RDW: 15.1 % (ref 11.5–15.5)
WBC: 9 10*3/uL (ref 4.0–10.5)

## 2017-10-08 LAB — D-DIMER, QUANTITATIVE (NOT AT ARMC): D-Dimer, Quant: 8.01 ug/mL-FEU — ABNORMAL HIGH (ref 0.00–0.50)

## 2017-10-08 LAB — TROPONIN I: Troponin I: <0.03 ng/mL (ref <0.03)

## 2017-10-08 MED ORDER — OXYCODONE-ACETAMINOPHEN 5-325 MG PO TABS
1.0000 | ORAL_TABLET | Freq: Once | ORAL | Status: DC
  Filled 2017-10-08: qty 1

## 2017-10-08 MED ORDER — ENOXAPARIN SODIUM 150 MG/ML ~~LOC~~ SOLN
1.0000 mg/kg | Freq: Once | SUBCUTANEOUS | Status: AC
  Administered 2017-10-08: 125 mg via SUBCUTANEOUS
  Filled 2017-10-08: qty 1

## 2017-10-08 MED ORDER — IOPAMIDOL (ISOVUE-370) INJECTION 76%
100.0000 mL | Freq: Once | INTRAVENOUS | Status: AC | PRN
  Administered 2017-10-08: 100 mL via INTRAVENOUS

## 2017-10-08 NOTE — ED Triage Notes (Addendum)
Pt C/O right knee swelling and pain that started on Tuesday. Pt also stating she has SOB with the pain.

## 2017-10-08 NOTE — ED Notes (Signed)
Pt states right leg pain started on & off  few weeks ago. Pain has become persistent over the past week.

## 2017-10-08 NOTE — ED Provider Notes (Signed)
[facility] EMERGENCY DEPARTMENT Provider Note   CSN: [id number] Arrival date & time: 10/08/17  1932     History   Chief Complaint Chief Complaint  Patient presents with  . Leg Swelling    HPI ANISHKA BUSHARD is a 56 y.o. female.  HPI   56 year old female with right lower extremity pain and swelling.  R knee and proximal calf. Gradual onset about a week ago.  Denies any trauma or strain.  No fevers or chills.  Has felt short of breath.  No cough.  Does have chest pain but "I always do."  Denies any acute change in this.  Denies any past history of DVT/PE.  Past Medical History:  Diagnosis Date  . Arthritis   . Bell's palsy   . CHF (congestive heart failure) ([place])   . Dysrhythmia, cardiac   . Fibromyalgia   . Hypertension   . Thyroid disease     Patient Active Problem List   Diagnosis Date Noted  . Palpitations 10/09/2016  . Prediabetes 06/10/2014  . Subclinical hypothyroidism 06/10/2014  . Abdominal pain, other specified site 12/26/2013  . Esophageal reflux 04/01/2013  . Fibromyalgia 03/22/2013  . Hypokalemia 03/22/2013  . Facial paralysis/Bells palsy 02/01/2013    Past Surgical History:  Procedure Laterality Date  . COLONOSCOPY N/A 01/23/2014   Procedure: COLONOSCOPY;  Surgeon: Rogene Houston, MD;  Location: [facility] ENDO SUITE;  Service: Endoscopy;  Laterality: N/A;  200  . TONSILLECTOMY      OB History    No data available       Home Medications    Prior to Admission medications   Medication Sig Start Date End Date Taking? Authorizing Provider  Ascorbic Acid (VITAMIN C) 500 MG CAPS Take 1 capsule by mouth 2 (two) times daily.    Yes [provider]  aspirin EC 81 MG tablet Take 81 mg by mouth daily.   Yes [provider]  Cholecalciferol (VITAMIN D-3) 5000 UNITS TABS Take 1 tablet by mouth daily.   Yes [provider]  Folic Acid-Vit F0-YOV Z85 (HOMOCYSTEINE FORMULA) 0.8-50-0.1 MG TABS Take 1 tablet by mouth daily.   Yes  [provider]  furosemide (LASIX) 20 MG tablet Take 2 tablets by mouth every morning. 05/24/17  Yes Luking, Elayne Snare, MD  Magnesium 200 MG TABS Take 2 tablets by mouth 2 (two) times daily.    Yes [provider]  Multiple Vitamin (MULTIVITAMIN WITH MINERALS) TABS tablet Take 1 tablet by mouth daily.   Yes [provider]  NALTREXONE HCL PO Take 3 mg by mouth at bedtime.    Yes [provider]  potassium chloride SA (K-DUR,KLOR-CON) 20 MEQ tablet Take 1 tablet (20 mEq total) by mouth daily. 05/16/17  Yes Kathyrn Drown, MD  thyroid (ARMOUR THYROID) 60 MG tablet Take 1 tablet (60 mg total) by mouth daily before breakfast. 12/10/16  Yes Luking, Elayne Snare, MD    Family History Family History  Adopted: Yes  Problem Relation Age of Onset  . Cancer Mother        lung    Social History Social History   Tobacco Use  . Smoking status: Former Smoker    Types: Cigarettes    Last attempt to quit: 11/08/2003    Years since quitting: 13.9  . Smokeless tobacco: Never Used  . Tobacco comment: quit smoking 10 yrs   Substance Use Topics  . Alcohol use: No  . Drug use: No  Allergies   Avelox [moxifloxacin hcl in nacl]; Bee venom; and Shellfish allergy   Review of Systems Review of Systems  All systems reviewed and negative, other than as noted in HPI.  Physical Exam Updated Vital Signs BP (!) 148/82   Pulse 97   Temp 98.5 F (36.9 C) (Oral)   Resp 20   Ht 5\' 5"  (1.651 m)   Wt 125.2 kg (276 lb)   LMP 01/23/2014 Comment: spotting  SpO2 99%   BMI 45.93 kg/m   Physical Exam  Constitutional: She appears well-developed and well-nourished. No distress.  HENT:  Head: Normocephalic and atraumatic.  Eyes: Conjunctivae are normal. Right eye exhibits no discharge. Left eye exhibits no discharge.  Neck: Neck supple.  Cardiovascular: Normal rate, regular rhythm and normal heart sounds. Exam reveals no gallop and no friction rub.  No murmur  heard. Pulmonary/Chest: Effort normal and breath sounds normal. No respiratory distress.  Abdominal: Soft. She exhibits no distension. There is no tenderness.  Musculoskeletal: She exhibits edema. She exhibits no tenderness.  Swelling of RLE as compared to L. R calf tenderness. No palpable cords. Negative Homan's. Palpable DP pulse.   Neurological: She is alert.  Skin: Skin is warm and dry.  Psychiatric: She has a normal mood and affect. Her behavior is normal. Thought content normal.  Nursing note and vitals reviewed.    ED Treatments / Results  Labs (all labs ordered are listed, but only abnormal results are displayed) Labs Reviewed  COMPREHENSIVE METABOLIC PANEL - Abnormal; Notable for the following components:      Result Value   Glucose, Bld 107 (*)    BUN 26 (*)    All other components within normal limits  D-DIMER, QUANTITATIVE (NOT AT [facility]) - Abnormal; Notable for the following components:   D-Dimer, Quant 8.01 (*)    All other components within normal limits  CBC WITH DIFFERENTIAL/PLATELET - Abnormal; Notable for the following components:   RBC 5.22 (*)    Hemoglobin 15.6 (*)    HCT 47.6 (*)    All other components within normal limits  TROPONIN I    EKG  EKG Interpretation None       Radiology No results found.  Procedures Procedures (including critical care time)  Medications Ordered in ED Medications  enoxaparin (LOVENOX) injection 125 mg (not administered)     Initial Impression / Assessment and Plan / ED Course  I have reviewed the triage vital signs and the nursing notes.  Pertinent labs & imaging results that were available during my care of the patient were reviewed by me and considered in my medical decision making (see chart for details).     56 year old female with atraumatic right lower extremity pain and swelling.  She does have swelling in the right lower extremity as compared to the left and some right calf tenderness.  D-dimer is  markedly elevated.  She reports that symptoms began in the past week.  Upon further review of records, she has been evaluated for similar complaints previously.  D-dimer is been markedly elevated on previous presentations and subsequent imaging has been negative for PE.  Will again obtain CTa to r/o PE.    Unfortunately cannot obtain ultrasound at this time. Given empiric Lovenox. Final Clinical Impressions(s) / ED Diagnoses   Final diagnoses:  Right leg pain    ED Discharge Orders    None       Virgel Manifold, MD 10/09/17 1916

## 2017-10-09 ENCOUNTER — Encounter (HOSPITAL_COMMUNITY): Payer: Self-pay | Admitting: Emergency Medicine

## 2017-10-09 ENCOUNTER — Emergency Department (HOSPITAL_COMMUNITY): Payer: Managed Care, Other (non HMO)

## 2017-10-09 NOTE — Discharge Instructions (Signed)
Take your usual prescriptions as previously directed.  Take over the counter tylenol and/or ibuprofen, as directed on packaging, as needed for discomfort.  Apply moist heat or ice to the area(s) of discomfort, for 15 minutes at a time, several times per day for the next few days.  Do not fall asleep on a heating or ice pack.  Call your regular medical doctor tomorrow to schedule a follow up appointment this week.  Return to the Emergency Department immediately if worsening.

## 2017-10-09 NOTE — ED Provider Notes (Signed)
Patient signed out to me pending results of CT angiogram of chest.  CT angiogram shows no evidence of pulmonary embolism.  She will need to be sent for venous Doppler testing.  Because of heavy snowfall predicted for tonight, she will be kept in the ED for venous ultrasound to be done in the morning.   Delora Fuel, MD 09/81/19 [phone]

## 2017-10-09 NOTE — ED Provider Notes (Signed)
Received at sign out with [place] RLE pending. [place] negative; pt reassured. Dx and testing d/w pt.  Questions answered.  Verb understanding, agreeable to d/c home with outpt f/u.    US Venous Img Lower Unilateral Right Result Date: 10/09/2017 CLINICAL DATA:  56 year old female with right lower extremity pain and swelling for the past 2 weeks EXAM: RIGHT LOWER EXTREMITY VENOUS DOPPLER ULTRASOUND TECHNIQUE: Gray-scale sonography with graded compression, as well as color Doppler and duplex ultrasound were performed to evaluate the lower extremity deep venous systems from the level of the common femoral vein and including the common femoral, femoral, profunda femoral, popliteal and calf veins including the posterior tibial, peroneal and gastrocnemius veins when visible. The superficial great saphenous vein was also interrogated. Spectral Doppler was utilized to evaluate flow at rest and with distal augmentation maneuvers in the common femoral, femoral and popliteal veins. COMPARISON:  None. FINDINGS: Contralateral Common Femoral Vein: Respiratory phasicity is normal and symmetric with the symptomatic side. No evidence of thrombus. Normal compressibility. Common Femoral Vein: No evidence of thrombus. Normal compressibility, respiratory phasicity and response to augmentation. Saphenofemoral Junction: No evidence of thrombus. Normal compressibility and flow on color Doppler imaging. Profunda Femoral Vein: No evidence of thrombus. Normal compressibility and flow on color Doppler imaging. Femoral Vein: No evidence of thrombus. Normal compressibility, respiratory phasicity and response to augmentation. Popliteal Vein: No evidence of thrombus. Normal compressibility, respiratory phasicity and response to augmentation. Calf Veins: No evidence of thrombus. Normal compressibility and flow on color Doppler imaging. Superficial Great Saphenous Vein: No evidence of thrombus. Normal compressibility. Venous Reflux:  None. Other  Findings:  None. IMPRESSION: No evidence of deep venous thrombosis. Electronically Signed   By: Jacqulynn Cadet M.D.   On: 10/09/2017 10:23      Francine Graven, DO 10/09/17 1034

## 2017-10-12 ENCOUNTER — Other Ambulatory Visit: Payer: Self-pay

## 2017-10-12 NOTE — Patient Outreach (Signed)
[facility]) Care Management  10/12/2017  PAYSLEY POPLAR [birth year] [id number]   1st Telephone call to patient for ED Utilization screening.  No answer.  HIPAA complaint voice message left with contact information.    Plan: RN Health Coach will make an outreach attempt to the patient with in three business days.  Lazaro Arms RN, BSN, [place] Direct Dial:  [phone] Fax: [phone]

## 2017-10-14 ENCOUNTER — Other Ambulatory Visit: Payer: Self-pay

## 2017-10-14 NOTE — Patient Outreach (Signed)
[facility]) Care Management  10/14/2017  Summer [name] [birth year] [id number]   "[facility] Care Management criteria for ED census screening calls is 6 ED visits in 6 months. This patient does not meet this criteria, case is being closed."   Plan:  [facility] will notify [facility] administrative assistants of case status.  Lazaro Arms RN, BSN, [place] Direct Dial:  [phone] Fax: [phone]

## 2017-11-10 ENCOUNTER — Telehealth: Payer: Self-pay | Admitting: Cardiovascular Disease

## 2017-11-10 NOTE — Telephone Encounter (Signed)
New Message  Pt c/o of Chest Pain: STAT if CP now or developed within 24 hours  1. Are you having CP right now? No 2. Are you experiencing any other symptoms (ex. SOB, nausea, vomiting, sweating)? SOB, nausea   3. How long have you been experiencing CP? On and off for about a week   4. Is your CP continuous or coming and going? Comes and go  5. Have you taken Nitroglycerin? No  ?

## 2017-11-10 NOTE — Telephone Encounter (Signed)
Pt states CP and SOB is not daily. Pt will come see B. Strader on 1/15. If sx become worse she will be seen in ED at that time.

## 2017-11-14 NOTE — Progress Notes (Signed)
Cardiology Office Note    Date:  11/15/2017   ID:  Summer [name], DOB Nov 07, 1960, MRN [id number]  PCP:  Kathyrn Drown, MD  Cardiologist: Dr. Bronson Ing  Chief Complaint  Patient presents with  . Follow-up    Chest Pain     History of Present Illness:    Summer [name] is a 57 y.o. female with past medical history of chronic chest pain (Coronary CT in 06/2017 showing no calcifications with calcium score of zero), palpitations, HTN, and anxiety who presents to the office today for evaluation of chest pain.   She was last examined by Jory Sims, DNP in 06/2017 and reported frequent palpitations when feeling anxious at home. Also reported not sleeping well. She had lost over 4 lbs with increased exercise and was walking for 20 minute intervals at a time. Her recent Coronary CT was reviewed in detail which provided some reassurance and no further testing was pursued at that time.   She called the office on 11/10/2017 reporting intermittent episodes of chest pain over the past week, therefore Cardiology follow-up was arranged.   In talking with the patient today, she reports a variety of symptoms over the past few weeks. Her home was recently found to have black mold and she has been experiencing dyspnea and a dry cough since this was noted (recent Chest CT in 10/2017 showed no acute abnormalities) . She does have chest discomfort at times but reports this occurred when she initially found out about her home and when her dog passed away last week. She describes the pain as a sharp pain lasting for seconds at a time and spontaneously resolving.  She has chronic lower extremity edema but denies any recent orthopnea or PND.  She does not check her blood pressure regularly but it is significantly elevated at 170/102 during today's visit, improved to 158/92 on recheck.   Past Medical History:  Diagnosis Date  . Arthritis   . Bell's palsy   . CHF (congestive heart failure) ([place])   .  Dysrhythmia, cardiac   . Fibromyalgia   . History of CT scan 06/2017   "coronary CT scan on 06/01/2017, this did not find any cardiac calcifications. Coronary calcium score of zero."  . Hypertension   . Thyroid disease     Past Surgical History:  Procedure Laterality Date  . COLONOSCOPY N/A 01/23/2014   Procedure: COLONOSCOPY;  Surgeon: Rogene Houston, MD;  Location: [facility] ENDO SUITE;  Service: Endoscopy;  Laterality: N/A;  200  . TONSILLECTOMY      Current Medications: Outpatient Medications Prior to Visit  Medication Sig Dispense Refill  . Ascorbic Acid (VITAMIN C) 500 MG CAPS Take 1 capsule by mouth 2 (two) times daily.     Marland Kitchen aspirin EC 81 MG tablet Take 81 mg by mouth daily.    . Cholecalciferol (VITAMIN D-3) 5000 UNITS TABS Take 1 tablet by mouth daily.    . Folic Acid-Vit W0-JWJ X91 (HOMOCYSTEINE FORMULA) 0.8-50-0.1 MG TABS Take 1 tablet by mouth daily.    . furosemide (LASIX) 20 MG tablet Take 2 tablets by mouth every morning. 60 tablet 3  . Magnesium 200 MG TABS Take 2 tablets by mouth 2 (two) times daily.     . Multiple Vitamin (MULTIVITAMIN WITH MINERALS) TABS tablet Take 1 tablet by mouth daily.    Marland Kitchen NALTREXONE HCL PO Take 3 mg by mouth at bedtime.     . potassium chloride SA (K-DUR,KLOR-CON) 20 MEQ tablet Take  1 tablet (20 mEq total) by mouth daily. 30 tablet 4  . thyroid (ARMOUR THYROID) 60 MG tablet Take 1 tablet (60 mg total) by mouth daily before breakfast. 30 tablet 6   No facility-administered medications prior to visit.      Allergies:   Avelox [moxifloxacin hcl in nacl]; Bee venom; and Shellfish allergy   Social History   Socioeconomic History  . Marital status: Married    Spouse name: None  . Number of children: None  . Years of education: None  . Highest education level: None  Social Needs  . Financial resource strain: None  . Food insecurity - worry: None  . Food insecurity - inability: None  . Transportation needs - medical: None  . Transportation  needs - non-medical: None  Occupational History  . None  Tobacco Use  . Smoking status: Former Smoker    Types: Cigarettes    Last attempt to quit: 11/08/2003    Years since quitting: 14.0  . Smokeless tobacco: Never Used  . Tobacco comment: quit smoking 10 yrs   Substance and Sexual Activity  . Alcohol use: No  . Drug use: No  . Sexual activity: Not Currently    Birth control/protection: Post-menopausal  Other Topics Concern  . None  Social History Narrative  . None     Family History:  The patient's family history includes Cancer in her mother. She was adopted.   Review of Systems:   Please see the history of present illness.     General:  No chills, fever, night sweats or weight changes.  Cardiovascular:  No edema, orthopnea, palpitations, paroxysmal nocturnal dyspnea. Positive for chest pain and dyspnea on exertion.  Dermatological: No rash, lesions/masses Respiratory: Positive for dry cough and dyspnea. Urologic: No hematuria, dysuria Abdominal:   No nausea, vomiting, diarrhea, bright red blood per rectum, melena, or hematemesis Neurologic:  No visual changes, wkns, changes in mental status. All other systems reviewed and are otherwise negative except as noted above.   Physical Exam:    VS:  BP (!) 158/92   Pulse 94   Ht 5\' 5"  (1.651 m)   Wt 263 lb (119.3 kg)   LMP 01/23/2014 Comment: spotting  SpO2 98%   BMI 43.77 kg/m    General: Well developed, well nourished Caucasian female appearing in no acute distress. Head: Normocephalic, atraumatic, sclera non-icteric, no xanthomas, nares are without discharge.  Neck: No carotid bruits. JVD not elevated.  Lungs: Respirations regular and unlabored, without wheezes or rales.  Heart: Regular rate and rhythm. No S3 or S4.  No murmur, no rubs, or gallops appreciated. Abdomen: Soft, non-tender, non-distended with normoactive bowel sounds. No hepatomegaly. No rebound/guarding. No obvious abdominal masses. Msk:  Strength and  tone appear normal for age. No joint deformities or effusions. Extremities: No clubbing or cyanosis. No lower extremity edema.  Distal pedal pulses are 2+ bilaterally. Neuro: Alert and oriented X 3. Moves all extremities spontaneously. No focal deficits noted. Psych:  Responds to questions appropriately with a normal affect. Skin: No rashes or lesions noted  Wt Readings from Last 3 Encounters:  11/15/17 263 lb (119.3 kg)  10/08/17 276 lb (125.2 kg)  06/17/17 276 lb (125.2 kg)     Studies/Labs Reviewed:   EKG:  EKG is ordered today. The ekg ordered today demonstrates NSR, HR 84, with no acute ST or T-wave changes when compared to prior tracings.   Recent Labs: 05/16/2017: B Natriuretic Peptide 26.0 10/08/2017: ALT 18; BUN 26; Creatinine,  Ser 0.64; Hemoglobin 15.6; Platelets 249; Potassium 3.7; Sodium 141   Lipid Panel    Component Value Date/Time   CHOL 163 10/07/2016 1537   TRIG 112 10/07/2016 1537   HDL 54 10/07/2016 1537   CHOLHDL 3.0 10/07/2016 1537   VLDL 22 10/07/2016 1537   LDLCALC 87 10/07/2016 1537    Additional studies/ records that were reviewed today include:   Echocardiogram: 05/2017 Study Conclusions  - Left ventricle: The cavity size was normal. Wall thickness was   normal. Systolic function was normal. The estimated ejection   fraction was in the range of 60% to 65%. Doppler parameters are   consistent with abnormal left ventricular relaxation (grade 1   diastolic dysfunction).  Coronary CT: 07/2017 FINDINGS: A 120 kV prospective scan was triggered in the descending thoracic aorta at 111 HU's. Axial non-contrast 3 mm slices were carried out through the heart. The data set was analyzed on a dedicated work station and scored using the [place]. 5 mg of iv Metoprolol and 0.8 mg of sl NTG was given. The 3D data set was reconstructed in 5% intervals of the 67-82 % of the R-R cycle. Diastolic phases were analyzed on a dedicated work station using MPR,  MIP and VRT modes. The patient received 80 cc of contrast.  Aorta:  Normal size.  No calcifications.  No dissection.  Aortic Valve:  Trileaflet.  No calcifications.  Coronary Arteries:  Normal coronary origin.  Right dominance.  RCA is a medium size dominant artery that gives rise to PDA. There is no plaque. PLVB is not visualized.  Left main is a large artery that gives rise to LAD and LCX arteries.  LAD is a large vessel that gives rise to one small diagonal branch and has no plaque.  LCX is a medium size non-dominant artery that gives rise to one large OM1 branch. There is no plaque.  Other findings:  Normal pulmonary vein drainage into the left atrium.  Normal let atrial appendage without a thrombus.  Mildly dilated pulmonary artery measuring 31 x 27 mm suspicious for pulmonary hypertension.  IMPRESSION: 1. Coronary calcium score of 0. This was 0 percentile for age and sex matched control.  2. Normal coronary origin with right dominance.  3. This study is affected significantly by artifact - poor contrast opacification and cardiac motion. However there is no obvious CAD.  Assessment:    1. Chest pain, unspecified type   2. Diastolic CHF, chronic ([place])   3. Essential hypertension   4. Dyspnea on exertion   5. Dry cough      Plan:   In order of problems listed above:  1. Chronic Chest Pain - has undergone a thorough workup with Echo in 05/2017 showing a preserved EF of 60-65% with no regional WMA and Coronary CT in 07/2017 showing no obvious CAD with a calcium score of 0. - she reports episodes of chest pain occurring in stressful situations such as when she found out her home had black mold and when her dog passed away. EKG today shows no acute ST or T-wave changes. Symptoms likely exacerbated by anxiety.  - we reviewed her recent Coronary CT and echo in detail. No plans for further ischemic evaluation at this time. Will start Amlodipine to  assist with BP control as this could be playing a factor in her symptoms.   2. Chronic Diastolic CHF - the patient appears euvolemic on examination. Weight has been stable on her home scales.  -  continue Lasix at current dosing.   3. HTN - BP is initially elevated to 170/102, improved to 158/92 on recheck. - currently on Lasix. Will start Amlodipine 2.5mg  daily. I have asked the patient to follow her BP and to report back if BP remains elevated. If intolerant to this, consider Losartan in the setting of her diastolic dysfunction (patient reluctant to ACE-I/ARB's at this time secondary to recent recalls).    4. Dyspnea on Exertion/ Dry Cough - the patient reports this has been occurring for the past 3 weeks, ever since she found out her home had black mold.  - recent Chest CT showed no acute abnormalities and lungs are clear on examination. Will obtain repeat CXR to assess for any interval changes in the setting of her new symptoms.   Medication Adjustments/Labs and Tests Ordered: Current medicines are reviewed at length with the patient today.  Concerns regarding medicines are outlined above.  Medication changes, Labs and Tests ordered today are listed in the Patient Instructions below. Patient Instructions  Medication Instructions:  Your physician has recommended you make the following change in your medication: Start Norvasc 2.5 mg Daily    Labwork: NONE   Testing/Procedures: A chest x-ray takes a picture of the organs and structures inside the chest, including the heart, lungs, and blood vessels. This test can show several things, including, whether the heart is enlarges; whether fluid is building up in the lungs; and whether pacemaker / defibrillator leads are still in place.  Follow-Up: Your physician wants you to follow-up in: 6 Months with Dr. Bronson Ing. You will receive a reminder letter in the mail two months in advance. If you don't receive a letter, please call our office to  schedule the follow-up appointment.  Your physician recommends that you schedule a follow-up appointment in: 2-3 Months with Dr. Wolfgang Phoenix for management of your blood pressure.   Any Other Special Instructions Will Be Listed Below (If Applicable).  If you need a refill on your cardiac medications before your next appointment, please call your pharmacy.  Thank you for choosing [facility]!     Signed, Erma Heritage, PA-C  11/15/2017 3:30 PM    [facility] [address] [place],  [address] Phone: [phone]

## 2017-11-15 ENCOUNTER — Ambulatory Visit (INDEPENDENT_AMBULATORY_CARE_PROVIDER_SITE_OTHER): Payer: Managed Care, Other (non HMO) | Admitting: Student

## 2017-11-15 ENCOUNTER — Encounter: Payer: Self-pay | Admitting: Student

## 2017-11-15 ENCOUNTER — Ambulatory Visit (HOSPITAL_COMMUNITY)
Admission: RE | Admit: 2017-11-15 | Discharge: 2017-11-15 | Disposition: A | Discharge status: Home / Self Care | Payer: Managed Care, Other (non HMO) | Source: Ambulatory Visit | Attending: Student | Admitting: Student

## 2017-11-15 VITALS — BP 158/92 | HR 94 | Ht 65.0 in | Wt 263.0 lb

## 2017-11-15 DIAGNOSIS — R058 Other specified cough: Secondary | ICD-10-CM

## 2017-11-15 DIAGNOSIS — R0600 Dyspnea, unspecified: Secondary | ICD-10-CM

## 2017-11-15 DIAGNOSIS — R0609 Other forms of dyspnea: Secondary | ICD-10-CM

## 2017-11-15 DIAGNOSIS — I10 Essential (primary) hypertension: Secondary | ICD-10-CM

## 2017-11-15 DIAGNOSIS — I5032 Chronic diastolic (congestive) heart failure: Secondary | ICD-10-CM

## 2017-11-15 DIAGNOSIS — R05 Cough: Secondary | ICD-10-CM | POA: Insufficient documentation

## 2017-11-15 DIAGNOSIS — R079 Chest pain, unspecified: Secondary | ICD-10-CM

## 2017-11-15 MED ORDER — AMLODIPINE BESYLATE 2.5 MG PO TABS: 2.5000 mg | ORAL_TABLET | Freq: Every day | ORAL | 3 refills | Status: DC

## 2017-11-15 NOTE — Patient Instructions (Signed)
Medication Instructions:  Your physician has recommended you make the following change in your medication: Start Norvasc 2.5 mg Daily    Labwork: NONE   Testing/Procedures: A chest x-ray takes a picture of the organs and structures inside the chest, including the heart, lungs, and blood vessels. This test can show several things, including, whether the heart is enlarges; whether fluid is building up in the lungs; and whether pacemaker / defibrillator leads are still in place.  Follow-Up: Your physician wants you to follow-up in: 6 Months with Dr. Bronson Ing. You will receive a reminder letter in the mail two months in advance. If you don't receive a letter, please call our office to schedule the follow-up appointment.  Your physician recommends that you schedule a follow-up appointment in: 2-3 Months with Dr. Wolfgang Phoenix for management of your blood pressure.    Any Other Special Instructions Will Be Listed Below (If Applicable).     If you need a refill on your cardiac medications before your next appointment, please call your pharmacy.  Thank you for choosing [facility]!

## 2017-11-16 ENCOUNTER — Telehealth: Payer: Self-pay | Admitting: *Deleted

## 2017-11-16 NOTE — Telephone Encounter (Signed)
-----   Message from Erma Heritage, [place] sent at 11/16/2017  7:29 AM EST ----- Please let the patient know her CXR showed no acute abnormalities. Thank you.

## 2017-11-16 NOTE — Telephone Encounter (Signed)
Called patient with test results. No answer. Left message to call back.  

## 2017-11-23 ENCOUNTER — Telehealth: Payer: Self-pay | Admitting: *Deleted

## 2017-11-23 ENCOUNTER — Ambulatory Visit (INDEPENDENT_AMBULATORY_CARE_PROVIDER_SITE_OTHER): Payer: Managed Care, Other (non HMO) | Admitting: Family Medicine

## 2017-11-23 VITALS — BP 124/86 | Temp 98.2°F | Ht 65.0 in | Wt 260.2 lb

## 2017-11-23 DIAGNOSIS — R059 Cough, unspecified: Secondary | ICD-10-CM

## 2017-11-23 DIAGNOSIS — R0600 Dyspnea, unspecified: Secondary | ICD-10-CM

## 2017-11-23 DIAGNOSIS — R05 Cough: Secondary | ICD-10-CM

## 2017-11-23 DIAGNOSIS — R0609 Other forms of dyspnea: Secondary | ICD-10-CM

## 2017-11-23 DIAGNOSIS — Z77120 Contact with and (suspected) exposure to mold (toxic): Secondary | ICD-10-CM

## 2017-11-23 MED ORDER — ALPRAZOLAM 0.5 MG PO TBDP: 0.5000 mg | ORAL_TABLET | Freq: Two times a day (BID) | ORAL | 0 refills | Status: DC | PRN

## 2017-11-23 MED ORDER — ALPRAZOLAM 0.5 MG PO TABS: 0.5000 mg | ORAL_TABLET | Freq: Three times a day (TID) | ORAL | 0 refills | Status: DC | PRN

## 2017-11-23 MED ORDER — ALBUTEROL SULFATE (2.5 MG/3ML) 0.083% IN NEBU: 2.5000 mg | INHALATION_SOLUTION | RESPIRATORY_TRACT | 12 refills | Status: DC | PRN

## 2017-11-23 NOTE — Progress Notes (Signed)
   Subjective:    Patient ID: Summer [name], female    DOB: 1960/11/02, 57 y.o.   MRN: [id number]  HPI Patient arrives with SOB and chest pain. Seen cardiologist last week who stated this was not cardiac.   Patient went through extensive workup from cardiology and did not show any improvement but they did not feel that her issue was cardiac  She states she has been exposed to black mold at her house and is very extensive throughout the house and she feels it is making her sick she has been trying to do as much as she can and cannot be at home to minimize her exposure she will relates difficulty breathing coughing congestion Review of Systems  Constitutional: Negative for activity change and fever.  HENT: Positive for congestion. Negative for ear pain and rhinorrhea.   Eyes: Negative for discharge.  Respiratory: Positive for cough and shortness of breath. Negative for wheezing.   Cardiovascular: Negative for chest pain and palpitations.  Gastrointestinal: Negative for abdominal pain.       Objective:   Physical Exam  Constitutional: She appears well-developed.  HENT:  Head: Normocephalic.  Right Ear: External ear normal.  Left Ear: External ear normal.  Nose: Nose normal.  Mouth/Throat: Oropharynx is clear and moist. No oropharyngeal exudate.  Eyes: Right eye exhibits no discharge. Left eye exhibits no discharge.  Neck: Neck supple. No tracheal deviation present.  Cardiovascular: Normal rate and normal heart sounds.  No murmur heard. Pulmonary/Chest: Effort normal and breath sounds normal. She has no wheezes. She has no rales.  Lymphadenopathy:    She has no cervical adenopathy.  Skin: Skin is warm and dry.  Nursing note and vitals reviewed.         Assessment & Plan:  I do not feel the patient is not in any type of CHF I believe this is more pulmonary Could certainly be related to black mold but there is no way that I can improve this with I recommend the patient  definitely move to another location to avoid black mold Also believe patient would benefit from pulmonary function testing and pulmonary consult She does not want the pulmonary consult currently because she cannot afford it Therefore she will go with the PFT Hopefully hospital might be able to give her charitable care  She would also benefit from prescription assistance through the county program we will have 1 of our staff help set this up

## 2017-11-23 NOTE — Telephone Encounter (Signed)
Patient interested in the county assistance program. Patient will need to go to the health department to speak with the coordinator and sign up for the program and if qualifies they send the papers for the doctor to complete.

## 2017-11-23 NOTE — Telephone Encounter (Signed)
Left message to return call 

## 2017-12-05 NOTE — Telephone Encounter (Signed)
Patient is aware 

## 2017-12-06 ENCOUNTER — Ambulatory Visit (HOSPITAL_COMMUNITY): Admission: RE | Admit: 2017-12-06 | Payer: Self-pay | Source: Ambulatory Visit

## 2018-05-27 IMAGING — DX DG CHEST 2V
2 series · 2 of 2 positions shown · non-contrast
Comparison: 04/03/2017

CLINICAL DATA: Mid chest pain

EXAM:
CHEST  2 VIEW

[chest pa]
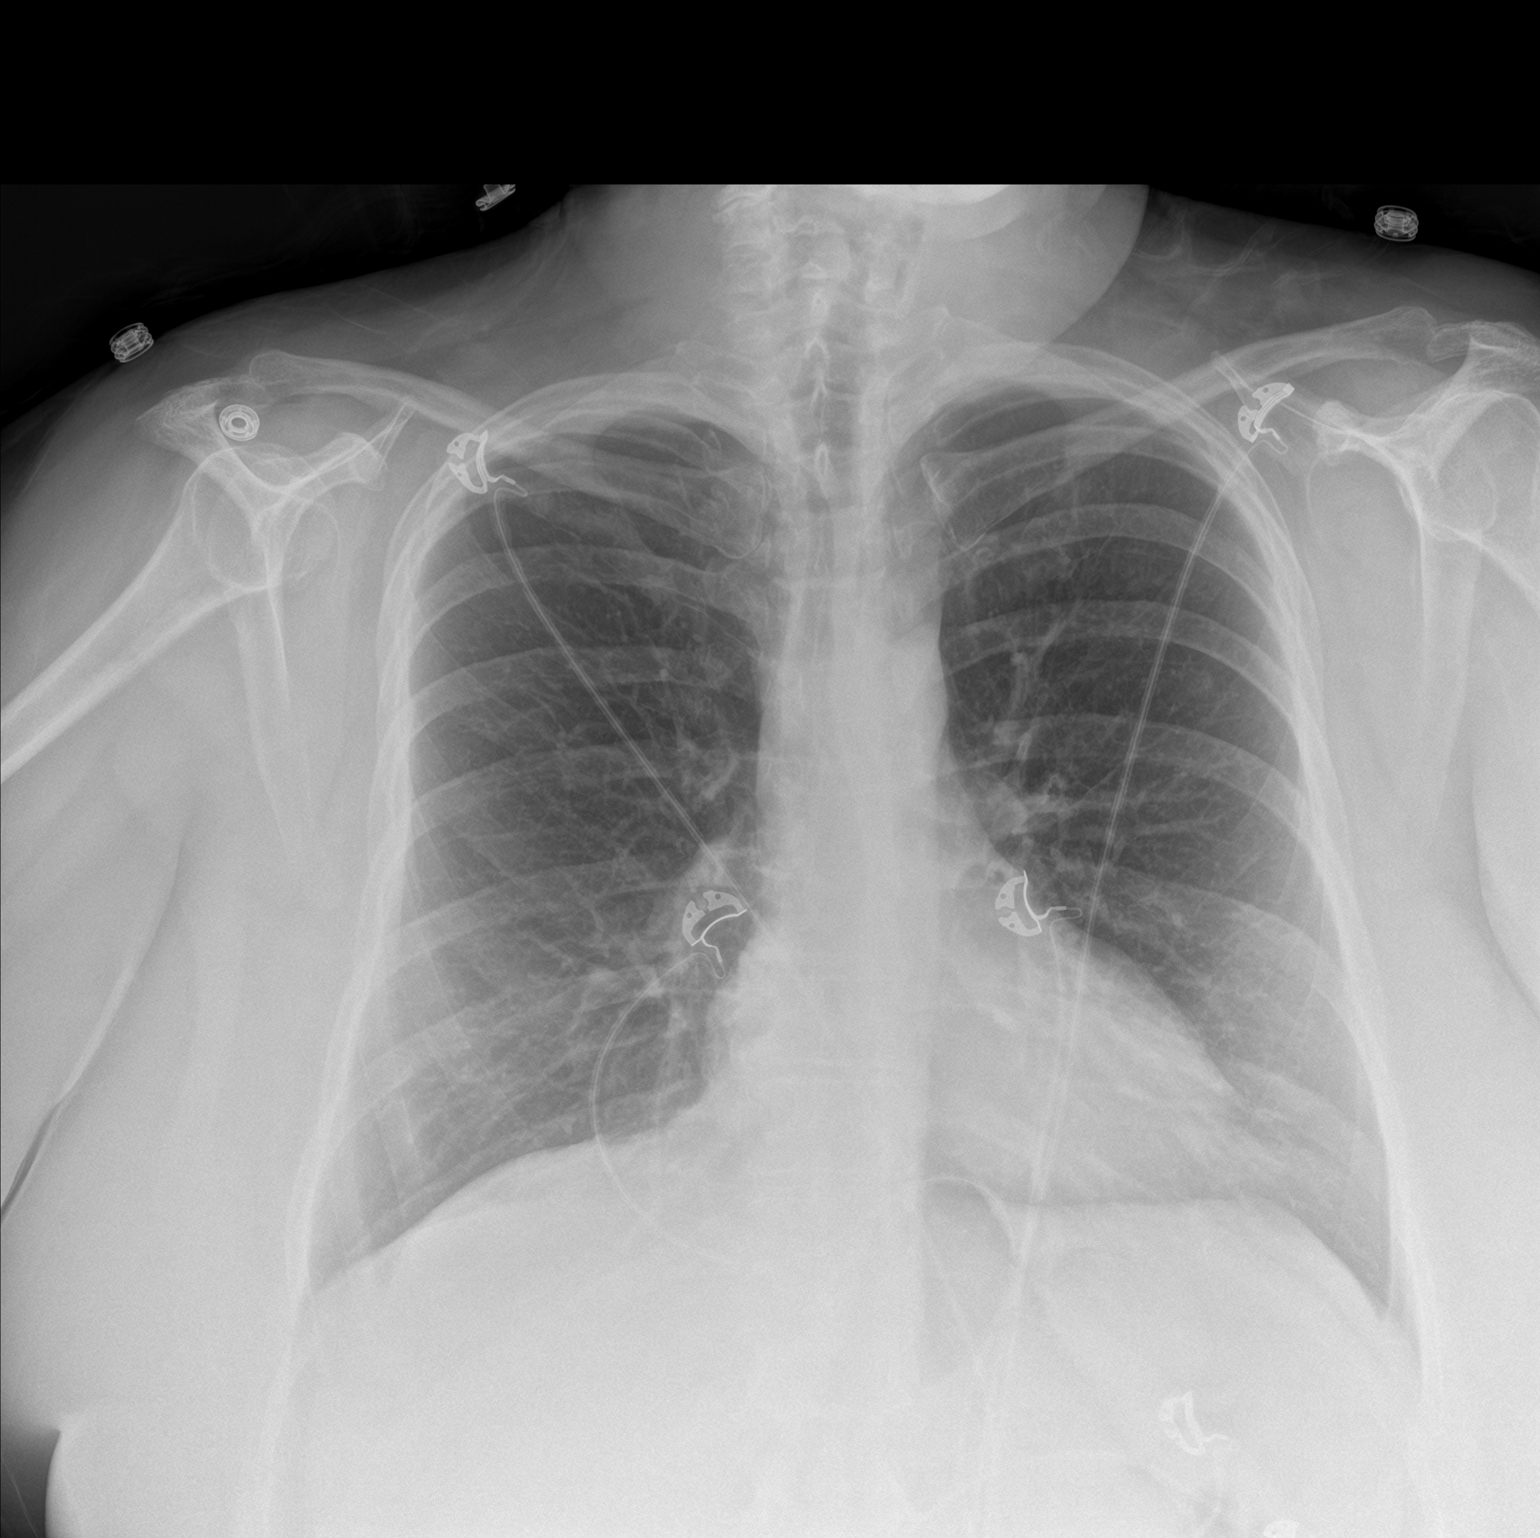

[chest lat]
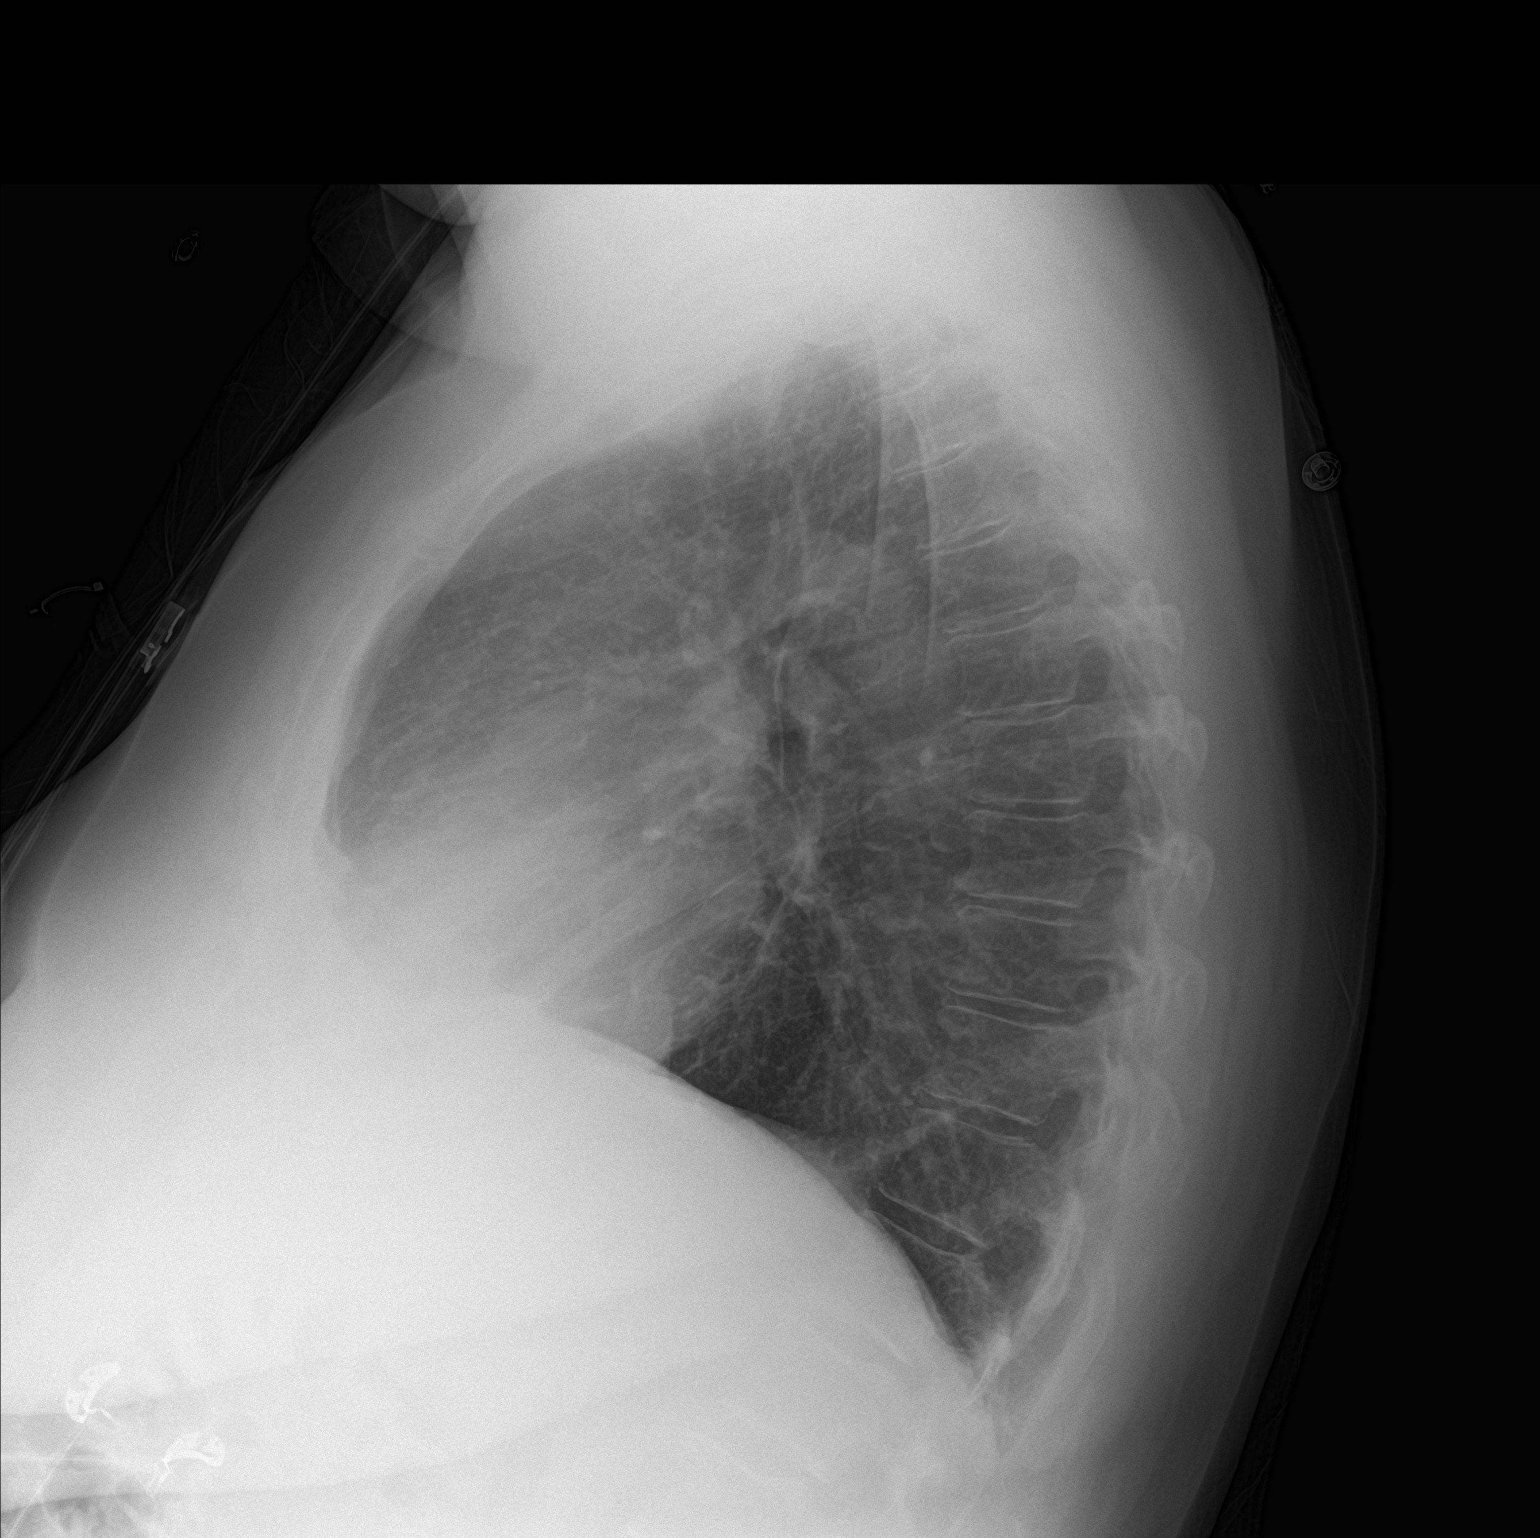

[2 of 2 positions shown; findings below may reference images not displayed]

FINDINGS: The heart size and mediastinal contours are within normal limits.
Both lungs are clear. The visualized skeletal structures are
unremarkable.
IMPRESSION: No active cardiopulmonary disease.

## 2018-07-06 ENCOUNTER — Telehealth: Payer: Self-pay | Admitting: Family Medicine

## 2018-07-06 NOTE — Telephone Encounter (Signed)
I called and left a message asked that she r/c. 

## 2018-07-06 NOTE — Telephone Encounter (Signed)
Patient advised Dr Nicki Reaper states this is certainly a concern It would be highly advisable for her to see OB/GYN as soon as possible for further evaluation more than likely they will need to do ultrasound and possibly put her on medicine there is even a possibility that it may need to do a D&C Very important she calls her gynecologist to get this set up for the near future If she is having difficulty getting this done she should let us know Patient verbalized understanding and stated she will call her GYN Dr Glo Herring for an appointment.

## 2018-07-06 NOTE — Telephone Encounter (Signed)
So this is certainly a concern It would be highly advisable for her to see OB/GYN as soon as possible for further evaluation more than likely they will need to do ultrasound and possibly put her on medicine there is even a possibility that it may need to do a D&C Very important she calls her gynecologist to get this set up for the near future If she is having difficulty getting this done she should let us know

## 2018-07-06 NOTE — Telephone Encounter (Signed)
Pt calling in this morning for advice. She has not had a menstral cycle in 3 years and woke up this morning and is bleeding. It is medium flow. She is wondering if she needs to come in for a follow up or go see an OBGYN or just wait and see how long it last.

## 2018-07-10 ENCOUNTER — Other Ambulatory Visit (HOSPITAL_COMMUNITY)
Admission: RE | Admit: 2018-07-10 | Discharge: 2018-07-10 | Disposition: A | Discharge status: Home / Self Care | Payer: BLUE CROSS/BLUE SHIELD | Source: Ambulatory Visit | Attending: Obstetrics and Gynecology | Admitting: Obstetrics and Gynecology

## 2018-07-10 ENCOUNTER — Encounter: Payer: Self-pay | Admitting: Obstetrics and Gynecology

## 2018-07-10 ENCOUNTER — Ambulatory Visit: Payer: BLUE CROSS/BLUE SHIELD | Admitting: Obstetrics and Gynecology

## 2018-07-10 VITALS — Ht 65.0 in | Wt 258.2 lb

## 2018-07-10 DIAGNOSIS — N950 Postmenopausal bleeding: Secondary | ICD-10-CM

## 2018-07-10 DIAGNOSIS — Z124 Encounter for screening for malignant neoplasm of cervix: Secondary | ICD-10-CM | POA: Diagnosis not present

## 2018-07-10 NOTE — Progress Notes (Signed)
Patient ID: Summer [name], female   DOB: 05-30-1961, 57 y.o.   MRN: [id number]    [facility] Visit  @DATE @            Patient name: Summer [name] MRN [id number]  Date of birth: June 06, 1961  CC & HPI:  Summer [name] is a 57 y.o. female presenting today for vaginal bleeding that started 9/5 and ended 9/8, when she hasn't had a period in 3-4 years. Her weight has dropped due to her and her husband splitting causing her situational stress. She has multiple health problems and is unable to drive due to health problems and has no form of an income. She has fibromyalgia, hypothyroidism. She has recently started experiencing dizziness. She is trying to get on SSI and just had her interview 07/06/18.  She had 1 vaginal delivery and lost 1 pregnancy.  ROS:  ROS +postmenopausal +vaginal bleeding +acute occasional dizziness -fever -chills All systems are negative except as noted in the HPI and PMH.   Pertinent History Reviewed:   Reviewed:  Medical         Past Medical History:  Diagnosis Date  . Arthritis   . Bell's palsy   . CHF (congestive heart failure) ([place])   . Dysrhythmia, cardiac   . Fibromyalgia   . History of CT scan 06/2017   "coronary CT scan on 06/01/2017, this did not find any cardiac calcifications. Coronary calcium score of zero."  . Hypertension   . Thyroid disease                               Surgical Hx:    Past Surgical History:  Procedure Laterality Date  . COLONOSCOPY N/A 01/23/2014   Procedure: COLONOSCOPY;  Surgeon: Rogene Houston, MD;  [facility];  Service: Endoscopy;  Laterality: N/A;  200  . TONSILLECTOMY     Medications: Reviewed & Updated - see associated section                       Current Outpatient Medications:  .  albuterol (PROVENTIL) (2.5 MG/3ML) 0.083% nebulizer solution, Take 3 mLs (2.5 mg total) by nebulization every 4 (four) hours as needed for wheezing., Disp: 75 mL, Rfl: 12 .  ALPRAZolam (XANAX) 0.5 MG tablet,  Take 1 tablet (0.5 mg total) by mouth 3 (three) times daily as needed for sleep or anxiety., Disp: 30 tablet, Rfl: 0 .  amLODipine (NORVASC) 2.5 MG tablet, Take 1 tablet (2.5 mg total) by mouth daily., Disp: 90 tablet, Rfl: 3 .  Ascorbic Acid (VITAMIN C) 500 MG CAPS, Take 1 capsule by mouth 2 (two) times daily. , Disp: , Rfl:  .  aspirin EC 81 MG tablet, Take 81 mg by mouth daily., Disp: , Rfl:  .  Cholecalciferol (VITAMIN D-3) 5000 UNITS TABS, Take 1 tablet by mouth daily., Disp: , Rfl:  .  Folic Acid-Vit Q7-RFF M38 (HOMOCYSTEINE FORMULA) 0.8-50-0.1 MG TABS, Take 1 tablet by mouth daily., Disp: , Rfl:  .  furosemide (LASIX) 20 MG tablet, Take 2 tablets by mouth every morning., Disp: 60 tablet, Rfl: 3 .  Magnesium 200 MG TABS, Take 2 tablets by mouth 2 (two) times daily. , Disp: , Rfl:  .  Multiple Vitamin (MULTIVITAMIN WITH MINERALS) TABS tablet, Take 1 tablet by mouth daily., Disp: , Rfl:  .  NALTREXONE HCL PO, Take 3 mg by mouth at  bedtime. , Disp: , Rfl:  .  potassium chloride SA (K-DUR,KLOR-CON) 20 MEQ tablet, Take 1 tablet (20 mEq total) by mouth daily., Disp: 30 tablet, Rfl: 4 .  thyroid (ARMOUR THYROID) 60 MG tablet, Take 1 tablet (60 mg total) by mouth daily before breakfast., Disp: 30 tablet, Rfl: 6   Social History: Reviewed -  reports that she quit smoking about 14 years ago. Her smoking use included cigarettes. She has never used smokeless tobacco.  Objective Findings:  Vitals: Last menstrual period 01/23/2014.  PHYSICAL EXAMINATION General appearance - alert, well appearing, and in no distress, oriented to person, place, and time and overweight Mental status - alert, oriented to person, place, and time, normal mood, behavior, speech, dress, motor activity, and thought processes, affect appropriate to mood  PELVIC Vagina - normal appearing Cervix - atropic, clot coming out  Uterus - bimanual deferred PAP: Pap smear done today test for GC/CHL.   Assessment & Plan:   A:   1. Postmenopausal bleeding  P:  1. Transvag u/s 9/16 2. recview u/s , Probable Endo Biopsy in 2 weeks    By signing my name below, I, Samul Dada, attest that this documentation has been prepared under the direction and in the presence of Jonnie Kind, MD. Electronically Signed: [place]. 07/10/18. 11:32 AM.  I personally performed the services described in this documentation, which was SCRIBED in my presence. The recorded information has been reviewed and considered accurate. It has been edited as necessary during review. Jonnie Kind, MD

## 2018-07-11 LAB — CYTOLOGY - PAP
Chlamydia: NEGATIVE
DIAGNOSIS: NEGATIVE
HPV: NOT DETECTED
Neisseria Gonorrhea: NEGATIVE

## 2018-07-17 ENCOUNTER — Other Ambulatory Visit: Payer: Self-pay | Admitting: Obstetrics and Gynecology

## 2018-07-17 DIAGNOSIS — N950 Postmenopausal bleeding: Secondary | ICD-10-CM

## 2018-07-19 ENCOUNTER — Ambulatory Visit: Payer: BLUE CROSS/BLUE SHIELD | Admitting: Obstetrics and Gynecology

## 2018-07-19 ENCOUNTER — Ambulatory Visit (INDEPENDENT_AMBULATORY_CARE_PROVIDER_SITE_OTHER): Payer: BLUE CROSS/BLUE SHIELD

## 2018-07-19 ENCOUNTER — Encounter: Payer: Self-pay | Admitting: Obstetrics and Gynecology

## 2018-07-19 ENCOUNTER — Other Ambulatory Visit: Payer: Self-pay

## 2018-07-19 ENCOUNTER — Ambulatory Visit (INDEPENDENT_AMBULATORY_CARE_PROVIDER_SITE_OTHER): Payer: BLUE CROSS/BLUE SHIELD | Admitting: Obstetrics and Gynecology

## 2018-07-19 DIAGNOSIS — N950 Postmenopausal bleeding: Secondary | ICD-10-CM

## 2018-07-19 NOTE — Progress Notes (Signed)
[facility] Visit  @DATE @            Patient name: Summer [name] MRN [id number]  Date of birth: 1961-01-06  CC & HPI:  Summer [name] is a 57 y.o. female presenting today for [place] preliminary results and follow up regarding post-menopausal bleeding. Pt reports associated vaginal spotting. Pt has not tried any medications for her symptoms. She denies any other symptoms.   ROS:  ROS +vaginal spotting and PMB All systems are negative except as noted in the HPI and PMH.    Pertinent History Reviewed:   Reviewed: Significant for  Medical         Past Medical History:  Diagnosis Date  . Arthritis   . Bell's palsy   . CHF (congestive heart failure) ([place])   . Dysrhythmia, cardiac   . Fibromyalgia   . Hashimoto's disease   . History of CT scan 06/2017   "coronary CT scan on 06/01/2017, this did not find any cardiac calcifications. Coronary calcium score of zero."  . Hypertension   . Thyroid disease                               Surgical Hx:    Past Surgical History:  Procedure Laterality Date  . COLONOSCOPY N/A 01/23/2014   Procedure: COLONOSCOPY;  Surgeon: Rogene Houston, MD;  [facility];  Service: Endoscopy;  Laterality: N/A;  200  . TONSILLECTOMY     Medications: Reviewed & Updated - see associated section                       Current Outpatient Medications:  .  albuterol (PROVENTIL) (2.5 MG/3ML) 0.083% nebulizer solution, Take 3 mLs (2.5 mg total) by nebulization every 4 (four) hours as needed for wheezing., Disp: 75 mL, Rfl: 12 .  ALPRAZolam (XANAX) 0.5 MG tablet, Take 1 tablet (0.5 mg total) by mouth 3 (three) times daily as needed for sleep or anxiety., Disp: 30 tablet, Rfl: 0 .  amLODipine (NORVASC) 2.5 MG tablet, Take 1 tablet (2.5 mg total) by mouth daily., Disp: 90 tablet, Rfl: 3 .  Ascorbic Acid (VITAMIN C) 500 MG CAPS, Take 1 capsule by mouth 2 (two) times daily. , Disp: , Rfl:  .  aspirin EC 81 MG tablet, Take 81 mg by mouth daily., Disp: ,  Rfl:  .  Cholecalciferol (VITAMIN D-3) 5000 UNITS TABS, Take 1 tablet by mouth daily., Disp: , Rfl:  .  Folic Acid-Vit V9-DGL O75 (HOMOCYSTEINE FORMULA) 0.8-50-0.1 MG TABS, Take 1 tablet by mouth daily., Disp: , Rfl:  .  furosemide (LASIX) 20 MG tablet, Take 2 tablets by mouth every morning., Disp: 60 tablet, Rfl: 3 .  Magnesium 200 MG TABS, Take 2 tablets by mouth 2 (two) times daily. , Disp: , Rfl:  .  Multiple Vitamin (MULTIVITAMIN WITH MINERALS) TABS tablet, Take 1 tablet by mouth daily., Disp: , Rfl:  .  NALTREXONE HCL PO, Take 3.5 mg by mouth at bedtime. , Disp: , Rfl:  .  potassium chloride SA (K-DUR,KLOR-CON) 20 MEQ tablet, Take 1 tablet (20 mEq total) by mouth daily., Disp: 30 tablet, Rfl: 4 .  thyroid (ARMOUR THYROID) 60 MG tablet, Take 1 tablet (60 mg total) by mouth daily before breakfast., Disp: 30 tablet, Rfl: 6   Social History: Reviewed -  reports that she quit smoking about 14 years ago. Her smoking  use included cigarettes. She has never used smokeless tobacco.  Objective Findings:  Vitals: Last menstrual period 01/23/2014.  PHYSICAL EXAMINATION General appearance - alert, well appearing, and in no distress Mental status - alert, oriented to person, place, and time  Discussion: 1. Discussed with pt regarding her recent US on today, 07/19/2018, sonohysterogram, and endometrial bx.  At end of discussion, pt had opportunity to ask questions and has no further questions at this time.   Specific discussion of recent US results, sonohysterogram, and endometrial bx as noted above. Greater than 50% was spent in counseling and coordination of care with the patient.   Total time greater than: 25 minutes.    Assessment & Plan:   A:  1.  PMB  P:  1.  follow up for endometrial biopsy in 1 week    By signing my name below, I, Soijett Blue, attest that this documentation has been prepared under the direction and in the presence of Jonnie Kind, MD. Electronically Signed:  Soijett Blue, Medical Scribe. 07/19/18. 2:07 PM.  I personally performed the services described in this documentation, which was SCRIBED in my presence. The recorded information has been reviewed and considered accurate. It has been edited as necessary during review. Jonnie Kind, MD

## 2018-07-19 NOTE — Progress Notes (Signed)
PELVIC US TA/TV:heterogeneous anteverted uterus,? Posterior submucosal fibroid 2.1 x 1.6 x 2.4 cm,borderline thickened endometrium 5.4 mm,right ovary not visualized,normal left ovary (only seen on TA images),no free fluid,pelvic pain during ultrasound

## 2018-08-04 ENCOUNTER — Ambulatory Visit: Payer: BLUE CROSS/BLUE SHIELD | Admitting: Family Medicine

## 2018-08-04 ENCOUNTER — Encounter: Payer: Self-pay | Admitting: Family Medicine

## 2018-08-04 VITALS — BP 130/86 | Ht 65.0 in | Wt 262.2 lb

## 2018-08-04 DIAGNOSIS — H8390 Unspecified disease of inner ear, unspecified ear: Secondary | ICD-10-CM | POA: Diagnosis not present

## 2018-08-04 DIAGNOSIS — E039 Hypothyroidism, unspecified: Secondary | ICD-10-CM | POA: Diagnosis not present

## 2018-08-04 DIAGNOSIS — R0600 Dyspnea, unspecified: Secondary | ICD-10-CM | POA: Diagnosis not present

## 2018-08-04 DIAGNOSIS — I889 Nonspecific lymphadenitis, unspecified: Secondary | ICD-10-CM

## 2018-08-04 DIAGNOSIS — E038 Other specified hypothyroidism: Secondary | ICD-10-CM

## 2018-08-04 MED ORDER — AMOXICILLIN 500 MG PO CAPS: 500.0000 mg | ORAL_CAPSULE | Freq: Three times a day (TID) | ORAL | 0 refills | Status: DC

## 2018-08-04 MED ORDER — THYROID 60 MG PO TABS: 60.0000 mg | ORAL_TABLET | Freq: Every day | ORAL | 6 refills | Status: DC

## 2018-08-04 MED ORDER — CITALOPRAM HYDROBROMIDE 10 MG PO TABS
10.0000 mg | ORAL_TABLET | Freq: Every day | ORAL | 3 refills | Status: DC
Start: 2018-08-04 — End: 2018-11-28

## 2018-08-04 MED ORDER — ALPRAZOLAM 0.5 MG PO TABS: 0.5000 mg | ORAL_TABLET | Freq: Three times a day (TID) | ORAL | 0 refills | Status: DC | PRN
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
## 2018-08-10 ENCOUNTER — Other Ambulatory Visit: Payer: Self-pay | Admitting: Obstetrics and Gynecology

## 2018-08-10 ENCOUNTER — Encounter: Payer: Self-pay | Admitting: Obstetrics and Gynecology

## 2018-08-10 ENCOUNTER — Ambulatory Visit: Payer: BLUE CROSS/BLUE SHIELD | Admitting: Obstetrics and Gynecology

## 2018-08-10 VITALS — BP 153/100 | HR 84 | Ht 65.0 in | Wt 257.6 lb

## 2018-08-10 DIAGNOSIS — N950 Postmenopausal bleeding: Secondary | ICD-10-CM | POA: Diagnosis not present

## 2018-08-10 NOTE — Progress Notes (Signed)
Added by:  Nehemiah Settle Hover for details     [facility] Visit  @DATE @ Patient name: Summer [name] MRN [id number] Date of birth: [birth year]  CC & HPI:  Summer [name] is a 57 y.o. female presenting today for [place] preliminary results and follow up regarding post-menopausal bleeding. Pt reports associated vaginal spotting. Pt has not tried any medications for her symptoms. She denies any other symptoms.  ROS:  ROS  +vaginal spotting and PMB  All systems are negative except as noted in the HPI and PMH.  Pertinent History Reviewed:  Reviewed: Significant for  Medical      Past Medical History:  Diagnosis Date  . Arthritis   . Bell's palsy   . CHF (congestive heart failure) ([place])   . Dysrhythmia, cardiac   . Fibromyalgia   . Hashimoto's disease   . History of CT scan 06/2017   "coronary CT scan on 06/01/2017, this did not find any cardiac calcifications. Coronary calcium score of zero."  . Hypertension   . Thyroid disease    Surgical Hx:       Past Surgical History:  Procedure Laterality Date  . COLONOSCOPY N/A 01/23/2014   Procedure: COLONOSCOPY; Surgeon: Rogene Houston, MD; [facility]; Service: Endoscopy; Laterality: N/A; 200  . TONSILLECTOMY     Medications: Reviewed & Updated - see associated section   Current Outpatient Medications:  . albuterol (PROVENTIL) (2.5 MG/3ML) 0.083% nebulizer solution, Take 3 mLs (2.5 mg total) by nebulization every 4 (four) hours as needed for wheezing., Disp: 75 mL, Rfl: 12  . ALPRAZolam (XANAX) 0.5 MG tablet, Take 1 tablet (0.5 mg total) by mouth 3 (three) times daily as needed for sleep or anxiety., Disp: 30 tablet, Rfl: 0  . amLODipine (NORVASC) 2.5 MG tablet, Take 1 tablet (2.5 mg total) by mouth daily., Disp: 90 tablet, Rfl: 3  . Ascorbic Acid (VITAMIN C) 500 MG CAPS, Take 1 capsule by mouth 2 (two) times daily. , Disp: , Rfl:  . aspirin EC 81 MG tablet, Take 81 mg by mouth daily., Disp: , Rfl:  .  Cholecalciferol (VITAMIN D-3) 5000 UNITS TABS, Take 1 tablet by mouth daily., Disp: , Rfl:  . Folic Acid-Vit L2-XNT Z00 (HOMOCYSTEINE FORMULA) 0.8-50-0.1 MG TABS, Take 1 tablet by mouth daily., Disp: , Rfl:  . furosemide (LASIX) 20 MG tablet, Take 2 tablets by mouth every morning., Disp: 60 tablet, Rfl: 3  . Magnesium 200 MG TABS, Take 2 tablets by mouth 2 (two) times daily. , Disp: , Rfl:  . Multiple Vitamin (MULTIVITAMIN WITH MINERALS) TABS tablet, Take 1 tablet by mouth daily., Disp: , Rfl:  . NALTREXONE HCL PO, Take 3.5 mg by mouth at bedtime. , Disp: , Rfl:  . potassium chloride SA (K-DUR,KLOR-CON) 20 MEQ tablet, Take 1 tablet (20 mEq total) by mouth daily., Disp: 30 tablet, Rfl: 4  . thyroid (ARMOUR THYROID) 60 MG tablet, Take 1 tablet (60 mg total) by mouth daily before breakfast., Disp: 30 tablet, Rfl: 6  Social History: Reviewed - reports that she quit smoking about 14 years ago. Her smoking use included cigarettes. She has never used smokeless tobacco.  Objective Findings:  Vitals: Last menstrual period 01/23/2014.  PHYSICAL EXAMINATION  General appearance - alert, well appearing, and in no distress  Mental status - alert, oriented to person, place, and time  Discussion:  1. Discussed with pt regarding her recent US on today, 07/19/2018, sonohysterogram, and endometrial bx.  At end  of discussion, pt had opportunity to ask questions and has no further questions at this time.  Specific discussion of recent US results, sonohysterogram, and endometrial bx as noted above. Greater than 50% was spent in counseling and coordination of care with the patient.  Total time greater than: 25 minutes.  Assessment & Plan:  A:  1. PMB P:  1. follow up for endometrial biopsy in 1 week  By signing my name below, I, Soijett Blue, attest that this documentation has been prepared under the direction and in the presence of Jonnie Kind, MD.  Electronically Signed: Soijett Blue, Medical Scribe.  07/19/18. 2:07 PM.   I personally performed the services described in this documentation, which was SCRIBED in my presence. The recorded information has been reviewed and considered accurate. It has been edited as necessary during review. Jonnie Kind, MD

## 2018-08-10 NOTE — Progress Notes (Signed)
Patient ID: Summer [name], female   DOB: [birth year], 57 y.o.   MRN: [id number]  Endometrial Biopsy:  Patient given informed consent, signed copy in the chart, time out was performed. Time out taken. The patient was placed in the lithotomy position and the cervix brought into view with sterile speculum.  Portio of cervix cleansed x 2 with betadine swabs.  A tenaculum was placed in the anterior lip of the cervix. The uterus was sounded for depth of 9.5 cm,. Milex uterine Explora 3 mm was introduced to into the uterus, suction created,  and an endometrial sample was obtained. All equipment was removed and accounted for.   8cc paracervical block was used. 1% Lidocaine at 2,4,6,8 and intracervical  Patient given post procedure instructions.  Followup by phone 3-5 working days.  By signing my name below, I, De Burrs, attest that this documentation has been prepared under the direction and in the presence of Jonnie Kind, MD. Electronically Signed: De Burrs, Medical Scribe. 08/10/18. 10:36 AM.  I personally performed the services described in this documentation, which was SCRIBED in my presence. The recorded information has been reviewed and considered accurate. It has been edited as necessary during review. Jonnie Kind, MD

## 2018-08-11 DIAGNOSIS — Z029 Encounter for administrative examinations, unspecified: Secondary | ICD-10-CM

## 2018-08-15 ENCOUNTER — Ambulatory Visit: Payer: BLUE CROSS/BLUE SHIELD | Admitting: Cardiovascular Disease

## 2018-08-15 ENCOUNTER — Encounter: Payer: Self-pay | Admitting: Cardiovascular Disease

## 2018-08-15 VITALS — BP 130/78 | HR 92 | Ht 65.0 in | Wt 260.0 lb

## 2018-08-15 DIAGNOSIS — R0602 Shortness of breath: Secondary | ICD-10-CM

## 2018-08-15 DIAGNOSIS — F419 Anxiety disorder, unspecified: Secondary | ICD-10-CM

## 2018-08-15 DIAGNOSIS — F32A Depression, unspecified: Secondary | ICD-10-CM

## 2018-08-15 DIAGNOSIS — M25473 Effusion, unspecified ankle: Secondary | ICD-10-CM

## 2018-08-15 DIAGNOSIS — M25474 Effusion, right foot: Secondary | ICD-10-CM

## 2018-08-15 DIAGNOSIS — I10 Essential (primary) hypertension: Secondary | ICD-10-CM | POA: Diagnosis not present

## 2018-08-15 DIAGNOSIS — M25476 Effusion, unspecified foot: Secondary | ICD-10-CM

## 2018-08-15 DIAGNOSIS — R079 Chest pain, unspecified: Secondary | ICD-10-CM | POA: Diagnosis not present

## 2018-08-15 DIAGNOSIS — R002 Palpitations: Secondary | ICD-10-CM

## 2018-08-15 DIAGNOSIS — F329 Major depressive disorder, single episode, unspecified: Secondary | ICD-10-CM

## 2018-08-15 DIAGNOSIS — M25475 Effusion, left foot: Secondary | ICD-10-CM
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
## 2018-08-15 NOTE — Patient Instructions (Signed)
Medication Instructions:   Your physician recommends that you continue on your current medications as directed. Please refer to the Current Medication list given to you today.  Labwork:  NONE  Testing/Procedures:  NONE  Follow-Up:  Your physician recommends that you schedule a follow-up appointment in: as needed.   Any Other Special Instructions Will Be Listed Below (If Applicable).  If you need a refill on your cardiac medications before your next appointment, please call your pharmacy. 

## 2018-08-16 ENCOUNTER — Telehealth: Payer: Self-pay | Admitting: Family Medicine

## 2018-08-16 NOTE — Telephone Encounter (Signed)
Swollen lymph nodes  better than what it was. No sob, low grade fever, finished antibiotic today. Also see message below about trush.

## 2018-08-16 NOTE — Telephone Encounter (Signed)
YPatient saw Dr. Nicki Reaper on 08-04-18 and was put on amoxiciilan for swollen lymph nodes and congestion,etc.  Patient said she is a little better but wants another round of antibiotics to knock it out. Also now has thrush on her tongue (white and has bad taste on tongue) from the medicine and wants something called in for that. [facility] in [place]

## 2018-08-17 ENCOUNTER — Telehealth: Payer: Self-pay | Admitting: *Deleted

## 2018-08-17 MED ORDER — NYSTATIN 100000 UNIT/ML MT SUSP: OROMUCOSAL | 2 refills | Status: DC

## 2018-08-17 MED ORDER — AMOXICILLIN 500 MG PO TABS: ORAL_TABLET | ORAL | 0 refills | Status: DC

## 2018-08-17 NOTE — Telephone Encounter (Signed)
Patient called for biopsy results.  Informed no malignancy.  Patient wanting to know what to do next as she is not scheduled for any future appts.  Please advise.

## 2018-08-17 NOTE — Telephone Encounter (Signed)
May have 1 refill of amoxicillin 500 mg 3 times daily 10 days As for the thrush I recommend nystatin oral solution 1 teaspoon swish and spit 4 times daily for 7 days may have 2 refills

## 2018-08-17 NOTE — Telephone Encounter (Signed)
Patient is aware 

## 2018-08-17 NOTE — Telephone Encounter (Signed)
Medication sent in. Have not notified the pt.

## 2018-08-22 ENCOUNTER — Telehealth: Payer: Self-pay | Admitting: Obstetrics and Gynecology

## 2018-08-22 NOTE — Telephone Encounter (Signed)
Pt results reviewed.  Pt comfortable with no further eval at this time. Biopsy benign Pt had elevated T levels in DRs [place] and [place] office in [place] Pt to get copy of records for [employer].

## 2018-08-23 ENCOUNTER — Ambulatory Visit (HOSPITAL_COMMUNITY)
Admission: RE | Admit: 2018-08-23 | Discharge: 2018-08-23 | Disposition: A | Discharge status: Home / Self Care | Payer: BLUE CROSS/BLUE SHIELD | Source: Ambulatory Visit | Attending: Family Medicine | Admitting: Family Medicine

## 2018-08-23 DIAGNOSIS — R0600 Dyspnea, unspecified: Secondary | ICD-10-CM | POA: Diagnosis not present

## 2018-08-23 DIAGNOSIS — R918 Other nonspecific abnormal finding of lung field: Secondary | ICD-10-CM | POA: Insufficient documentation

## 2018-08-23 DIAGNOSIS — Z87891 Personal history of nicotine dependence: Secondary | ICD-10-CM | POA: Diagnosis not present

## 2018-08-23 DIAGNOSIS — Z79899 Other long term (current) drug therapy: Secondary | ICD-10-CM | POA: Diagnosis not present

## 2018-08-23 LAB — PULMONARY FUNCTION TEST
DL/VA % pred: 103 %
DL/VA: 5.1 ml/min/mmHg/L
DLCO unc % pred: 102 %
DLCO unc: 26.3 ml/min/mmHg
FEF 25-75 PRE: 1.78 L/s
FEF 25-75 Post: 2.13 L/sec
FEF2575-%Change-Post: 19 %
FEF2575-%PRED-POST: 81 %
FEF2575-%PRED-PRE: 68 %
FEV1-%Change-Post: 6 %
FEV1-%Pred-Post: 87 %
FEV1-%Pred-Pre: 82 %
FEV1-Post: 2.44 L
FEV1-Pre: 2.3 L
FEV1FVC-%Change-Post: 3 %
FEV1FVC-%PRED-PRE: 92 %
FEV6-%Change-Post: 1 %
FEV6-%Pred-Post: 92 %
FEV6-%Pred-Pre: 91 %
FEV6-Post: 3.2 L
FEV6-Pre: 3.14 L
FEV6FVC-%Pred-Post: 103 %
FEV6FVC-%Pred-Pre: 103 %
FVC-%CHANGE-POST: 2 %
FVC-%PRED-POST: 90 %
FVC-%Pred-Pre: 88 %
FVC-PRE: 3.14 L
FVC-Post: 3.22 L
POST FEV1/FVC RATIO: 76 %
Post FEV6/FVC ratio: 100 %
Pre FEV1/FVC ratio: 73 %
Pre FEV6/FVC Ratio: 100 %
RV % pred: 162 %
RV: 3.15 L
TLC % pred: 120 %
TLC: 6.29 L

## 2018-08-23 MED ORDER — ALBUTEROL SULFATE (2.5 MG/3ML) 0.083% IN NEBU
2.5000 mg | INHALATION_SOLUTION | Freq: Once | RESPIRATORY_TRACT | Status: AC
  Administered 2018-08-23: 2.5 mg via RESPIRATORY_TRACT

## 2018-08-28 ENCOUNTER — Other Ambulatory Visit: Payer: Self-pay | Admitting: *Deleted

## 2018-08-29 ENCOUNTER — Other Ambulatory Visit: Payer: Self-pay | Admitting: *Deleted

## 2018-08-29 DIAGNOSIS — R942 Abnormal results of pulmonary function studies: Secondary | ICD-10-CM

## 2018-08-30 ENCOUNTER — Encounter: Payer: Self-pay | Admitting: Family Medicine

## 2018-09-18 ENCOUNTER — Other Ambulatory Visit: Payer: Self-pay | Admitting: Family Medicine

## 2018-09-18 NOTE — Telephone Encounter (Signed)
This +3 refills on each

## 2018-09-26 ENCOUNTER — Ambulatory Visit (INDEPENDENT_AMBULATORY_CARE_PROVIDER_SITE_OTHER): Payer: BLUE CROSS/BLUE SHIELD | Admitting: Pulmonary Disease

## 2018-09-26 ENCOUNTER — Encounter: Payer: Self-pay | Admitting: Pulmonary Disease

## 2018-09-26 VITALS — BP 126/88 | HR 88 | Ht 64.0 in | Wt 266.4 lb

## 2018-09-26 DIAGNOSIS — J449 Chronic obstructive pulmonary disease, unspecified: Secondary | ICD-10-CM

## 2018-09-26 DIAGNOSIS — Z87891 Personal history of nicotine dependence: Secondary | ICD-10-CM

## 2018-09-26 DIAGNOSIS — J4489 Other specified chronic obstructive pulmonary disease: Secondary | ICD-10-CM

## 2018-09-26 MED ORDER — FLUTICASONE FUROATE-VILANTEROL 200-25 MCG/INH IN AEPB: 1.0000 | INHALATION_SPRAY | Freq: Every day | RESPIRATORY_TRACT | 0 refills | Status: DC

## 2018-09-26 MED ORDER — ALBUTEROL SULFATE (2.5 MG/3ML) 0.083% IN NEBU: 2.5000 mg | INHALATION_SOLUTION | RESPIRATORY_TRACT | 11 refills | Status: DC | PRN

## 2018-09-26 NOTE — Progress Notes (Signed)
Synopsis: Referred in November 2019 or an abnormal PFT she smoked at least 1 pack of cigarettes daily for 23 years, quit around 2005.  Subjective:   PATIENT ID: Summer [name] GENDER: female DOB: 02/21/1961, MRN: [id number]   HPI  Chief Complaint  Patient presents with  . CONSULT    increasing SOB / recent PFT and was told abnormal findings    Dyspnea: > feels like she can't get enough air > has been going on for a year > climbing stairs makes her short of breath  > carrying groceries makes her dyspnea > sometimes just walking on level ground makes her short of breath > She has not changed her behavior over this > some days are worse than others > she says taht she has a hart dime with hot or cold temperatures > strong perfumes or fumes will make her short of breath  She doesn't have her albuterol or neb machine at home.   CHildhood was characterized by a lot of allergies and problems breathing.  She was on allergy shots for years.  She knows that she was allergic to bees.    She was adopted.    She smoked int he past, not now.    She gets bronchitis multiple times per year.  She doesn't cough up mucus every day, just when she is sick.   Past Medical History:  Diagnosis Date  . Arthritis   . Bell's palsy   . CHF (congestive heart failure) ([place])   . Dysrhythmia, cardiac   . Fibromyalgia   . Hashimoto's disease   . History of CT scan 06/2017   "coronary CT scan on 06/01/2017, this did not find any cardiac calcifications. Coronary calcium score of zero."  . Hypertension   . Thyroid disease      Family History  Adopted: Yes  Problem Relation Age of Onset  . Cancer Mother        lung  . Other Daughter        Lyme's disease     Social History   Socioeconomic History  . Marital status: Legally Separated    Spouse name: Not on file  . Number of children: Not on file  . Years of education: Not on file  . Highest education level: Not on file  Occupational  History  . Not on file  Social Needs  . Financial resource strain: Not on file  . Food insecurity:    Worry: Not on file    Inability: Not on file  . Transportation needs:    Medical: Not on file    Non-medical: Not on file  Tobacco Use  . Smoking status: Former Smoker    Packs/day: 1.00    Years: 20.00    Pack years: 20.00    Types: Cigarettes    Last attempt to quit: 11/08/2003    Years since quitting: 14.8  . Smokeless tobacco: Never Used  . Tobacco comment: quit smoking 10 yrs   Substance and Sexual Activity  . Alcohol use: No  . Drug use: No  . Sexual activity: Not Currently    Birth control/protection: Post-menopausal  Lifestyle  . Physical activity:    Days per week: Not on file    Minutes per session: Not on file  . Stress: Not on file  Relationships  . Social connections:    Talks on phone: Not on file    Gets together: Not on file    Attends religious service: Not on  file    Active member of club or organization: Not on file    Attends meetings of clubs or organizations: Not on file    Relationship status: Not on file  . Intimate partner violence:    Fear of current or ex partner: Not on file    Emotionally abused: Not on file    Physically abused: Not on file    Forced sexual activity: Not on file  Other Topics Concern  . Not on file  Social History Narrative  . Not on file     Allergies  Allergen Reactions  . Avelox [Moxifloxacin Hcl In Nacl] Other (See Comments)    "heart beats weird"  . Bee Venom Hives  . Shellfish Allergy Hives     Outpatient Medications Prior to Visit  Medication Sig Dispense Refill  . acyclovir (ZOVIRAX) 800 MG tablet Take 800 mg by mouth 2 (two) times daily.    Marland Kitchen ALPRAZolam (XANAX) 0.5 MG tablet TAKE 1 TABLET BY MOUTH THREE TIMES DAILY AS NEEDED FOR SLEEP FOR ANXIETY 30 tablet 3  . Ascorbic Acid (VITAMIN C) 500 MG CAPS Take 1 capsule by mouth 2 (two) times daily.     Marland Kitchen aspirin EC 81 MG tablet Take 81 mg by mouth daily.      . Cholecalciferol (VITAMIN D-3) 5000 UNITS TABS Take 1 tablet by mouth daily.    . citalopram (CELEXA) 10 MG tablet Take 1 tablet (10 mg total) by mouth daily. 30 tablet 3  . Folic Acid-Vit V4-QVZ D63 (HOMOCYSTEINE FORMULA) 0.8-50-0.1 MG TABS Take 1 tablet by mouth daily.    . furosemide (LASIX) 20 MG tablet TAKE 2 TABLETS BY MOUTH IN THE MORNING 60 tablet 3  . Magnesium 200 MG TABS Take 2 tablets by mouth 2 (two) times daily.     . Multiple Vitamin (MULTIVITAMIN WITH MINERALS) TABS tablet Take 1 tablet by mouth daily.    Marland Kitchen NALTREXONE HCL PO Take 3.5 mg by mouth at bedtime.     . potassium chloride SA (K-DUR,KLOR-CON) 20 MEQ tablet TAKE 1 TABLET BY MOUTH ONCE DAILY 30 tablet 3  . thyroid (ARMOUR THYROID) 60 MG tablet Take 1 tablet (60 mg total) by mouth daily before breakfast. 30 tablet 6  . amoxicillin (AMOXIL) 500 MG capsule Take 1 capsule (500 mg total) by mouth 3 (three) times daily. 30 capsule 0  . amoxicillin (AMOXIL) 500 MG tablet One po Three times a day for 10 days 30 tablet 0  . albuterol (PROVENTIL) (2.5 MG/3ML) 0.083% nebulizer solution Take 3 mLs (2.5 mg total) by nebulization every 4 (four) hours as needed for wheezing. (Patient not taking: Reported on 09/26/2018) 75 mL 12  . nystatin (MYCOSTATIN) 100000 UNIT/ML suspension One tsp swish and spit four times per day for seven days. (Patient not taking: Reported on 09/26/2018) 140 mL 2  . amLODipine (NORVASC) 2.5 MG tablet Take 1 tablet (2.5 mg total) by mouth daily. 90 tablet 3   No facility-administered medications prior to visit.     Review of Systems  Constitutional: Negative for chills, fever, malaise/fatigue and weight loss.  HENT: Negative for congestion, nosebleeds, sinus pain and sore throat.   Eyes: Negative for photophobia, pain and discharge.  Respiratory: Positive for cough and shortness of breath. Negative for hemoptysis, sputum production and wheezing.   Cardiovascular: Negative for chest pain, palpitations,  orthopnea and leg swelling.  Gastrointestinal: Negative for abdominal pain, constipation, diarrhea, nausea and vomiting.  Genitourinary: Negative for dysuria, frequency, hematuria and urgency.  Musculoskeletal: Negative for back pain, joint pain, myalgias and neck pain.  Skin: Negative for itching and rash.  Neurological: Negative for tingling, tremors, sensory change, speech change, focal weakness, seizures, weakness and headaches.  Psychiatric/Behavioral: Negative for memory loss, substance abuse and suicidal ideas. The patient is not nervous/anxious.       Objective:  Physical Exam   Vitals:   09/26/18 1118  BP: 126/88  Pulse: 88  SpO2: 96%  Weight: 266 lb 6.4 oz (120.8 kg)  Height: 5\' 4"  (1.626 m)    RA  Gen: morbidly obese but chronically ill appearing, no acute distress HENT: NCAT, OP clear, neck supple without masses Eyes: PERRL, EOMi Lymph: no cervical lymphadenopathy PULM: CTA B CV: RRR, no mgr, no JVD GI: BS+, soft, nontender, no hsm Derm: no rash or skin breakdown MSK: normal bulk and tone Neuro: A&Ox4, CN II-XII intact, strength 5/5 in all 4 extremities Psyche: normal mood and affect   CBC    Component Value Date/Time   WBC 9.0 10/08/2017 2231   RBC 5.22 (H) 10/08/2017 2231   HGB 15.6 (H) 10/08/2017 2231   HCT 47.6 (H) 10/08/2017 2231   PLT 249 10/08/2017 2231   MCV 91.2 10/08/2017 2231   MCH 29.9 10/08/2017 2231   MCHC 32.8 10/08/2017 2231   RDW 15.1 10/08/2017 2231   LYMPHSABS 3.6 10/08/2017 2231   MONOABS 0.8 10/08/2017 2231   EOSABS 0.3 10/08/2017 2231   BASOSABS 0.1 10/08/2017 2231     Chest imaging: January 2019 chest x-ray showed hyperinflation, no pulmonary parenchymal abnormality, images independently reviewed  PFT: October 2019 ratio 73%, FEV1 2.44 L 87% predicted, total lung capacity 6.29 L 120% predicted, residual volume 162% predicted, DLCO 26.3 102% predicted  Labs:  Path:  Echo:  Heart Catheterization:  Records from  her visit with Dr. Bronson Ing with cardiology reviewed from August 15, 2018 where she was seen for chronic chest pain felt to be due to noncardiac etiology, hypertension and shortness of breath.  It was noted that she smoked for 23 years.  She had bilateral ankle swelling due to venous insufficiency treated with Lasix.     Assessment & Plan:   No diagnosis found.  Discussion: Summer [name] comes to my clinic today for evaluation of episodic shortness of breath which has been worsening in the last year.  She notes that she has heavy exposure to mold from a home she lived in recently and she had several dogs in the home.  She also has an extensive smoking history.  Her physical exam is within normal limits with the exception of morbid obesity.  So I think her shortness of breath is likely due in large part to obesity and deconditioning though her lung function testing did show air trapping.  Given her heavy smoking history, her history of severe allergies as a child and episodic nature of her shortness of breath I think she likely has asthma with small airways disease related to her prior heavy smoking use.  Plan: Shortness of breath: I think this is due to asthma, early COPD, and being overweight Try taking Breo 1 puff daily no matter how you feel Use albuterol nebulized every 4-6 hours as needed for chest tightness wheezing or shortness of breath Practice good hand hygiene Stay active  To help lose weight: The following behaviors have been associated with weight loss: Weigh yourself daily Write down everything you eat Drink a glass of water prior to eating a meal Only eat when you are  hungry Buy food from the periphery of the grocery store, not the middle  I would like for you to come back in 2 weeks to see a nurse practitioner to make sure your breathing has improved with Breo and the DuoNeb.  If you are doing well at that point then we can call in a prescription for the [facility].    Current  Outpatient Medications:  .  acyclovir (ZOVIRAX) 800 MG tablet, Take 800 mg by mouth 2 (two) times daily., Disp: , Rfl:  .  ALPRAZolam (XANAX) 0.5 MG tablet, TAKE 1 TABLET BY MOUTH THREE TIMES DAILY AS NEEDED FOR SLEEP FOR ANXIETY, Disp: 30 tablet, Rfl: 3 .  Ascorbic Acid (VITAMIN C) 500 MG CAPS, Take 1 capsule by mouth 2 (two) times daily. , Disp: , Rfl:  .  aspirin EC 81 MG tablet, Take 81 mg by mouth daily., Disp: , Rfl:  .  Cholecalciferol (VITAMIN D-3) 5000 UNITS TABS, Take 1 tablet by mouth daily., Disp: , Rfl:  .  citalopram (CELEXA) 10 MG tablet, Take 1 tablet (10 mg total) by mouth daily., Disp: 30 tablet, Rfl: 3 .  Folic Acid-Vit Z3-YQM V78 (HOMOCYSTEINE FORMULA) 0.8-50-0.1 MG TABS, Take 1 tablet by mouth daily., Disp: , Rfl:  .  furosemide (LASIX) 20 MG tablet, TAKE 2 TABLETS BY MOUTH IN THE MORNING, Disp: 60 tablet, Rfl: 3 .  Magnesium 200 MG TABS, Take 2 tablets by mouth 2 (two) times daily. , Disp: , Rfl:  .  Multiple Vitamin (MULTIVITAMIN WITH MINERALS) TABS tablet, Take 1 tablet by mouth daily., Disp: , Rfl:  .  NALTREXONE HCL PO, Take 3.5 mg by mouth at bedtime. , Disp: , Rfl:  .  potassium chloride SA (K-DUR,KLOR-CON) 20 MEQ tablet, TAKE 1 TABLET BY MOUTH ONCE DAILY, Disp: 30 tablet, Rfl: 3 .  thyroid (ARMOUR THYROID) 60 MG tablet, Take 1 tablet (60 mg total) by mouth daily before breakfast., Disp: 30 tablet, Rfl: 6 .  albuterol (PROVENTIL) (2.5 MG/3ML) 0.083% nebulizer solution, Take 3 mLs (2.5 mg total) by nebulization every 4 (four) hours as needed for wheezing. (Patient not taking: Reported on 09/26/2018), Disp: 75 mL, Rfl: 12 .  nystatin (MYCOSTATIN) 100000 UNIT/ML suspension, One tsp swish and spit four times per day for seven days. (Patient not taking: Reported on 09/26/2018), Disp: 140 mL, Rfl: 2

## 2018-09-26 NOTE — Patient Instructions (Signed)
Shortness of breath: I think this is due to asthma, early COPD, and being overweight Try taking Breo 1 puff daily no matter how you feel Use albuterol nebulized every 4-6 hours as needed for chest tightness wheezing or shortness of breath Practice good hand hygiene Stay active  To help lose weight: The following behaviors have been associated with weight loss: Weigh yourself daily Write down everything you eat Drink a glass of water prior to eating a meal Only eat when you are hungry Buy food from the periphery of the grocery store, not the middle  I would like for you to come back in 2 weeks to see a nurse practitioner to make sure your breathing has improved with Breo and the DuoNeb.  If you are doing well at that point then we can call in a prescription for the [facility].

## 2018-10-10 ENCOUNTER — Ambulatory Visit: Payer: Self-pay | Admitting: Primary Care
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
## 2018-11-06 ENCOUNTER — Ambulatory Visit: Payer: Self-pay | Admitting: Primary Care

## 2018-11-28 ENCOUNTER — Ambulatory Visit: Payer: Self-pay | Admitting: Family Medicine

## 2018-11-28 ENCOUNTER — Encounter: Payer: Self-pay | Admitting: Family Medicine

## 2018-11-28 ENCOUNTER — Ambulatory Visit: Payer: BLUE CROSS/BLUE SHIELD | Admitting: Family Medicine

## 2018-11-28 VITALS — BP 122/80 | Temp 97.8°F | Ht 64.0 in | Wt 268.0 lb

## 2018-11-28 DIAGNOSIS — M545 Low back pain, unspecified: Secondary | ICD-10-CM

## 2018-11-28 DIAGNOSIS — R109 Unspecified abdominal pain: Secondary | ICD-10-CM

## 2018-11-28 LAB — POCT URINALYSIS DIPSTICK
Spec Grav, UA: 1.01 (ref 1.010–1.025)
Urobilinogen, UA: 4 E.U./dL — AB
pH, UA: 7 (ref 5.0–8.0)

## 2018-11-28 MED ORDER — HYDROCODONE-ACETAMINOPHEN 5-325 MG PO TABS: 1.0000 | ORAL_TABLET | Freq: Four times a day (QID) | ORAL | 0 refills | Status: DC | PRN

## 2018-11-28 MED ORDER — CITALOPRAM HYDROBROMIDE 10 MG PO TABS: 10.0000 mg | ORAL_TABLET | Freq: Every day | ORAL | 3 refills | Status: DC

## 2018-11-28 MED ORDER — CEPHALEXIN 500 MG PO CAPS: 500.0000 mg | ORAL_CAPSULE | Freq: Four times a day (QID) | ORAL | 0 refills | Status: DC
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
## 2018-12-05 ENCOUNTER — Ambulatory Visit: Payer: Self-pay | Admitting: Primary Care

## 2018-12-21 ENCOUNTER — Ambulatory Visit (HOSPITAL_COMMUNITY)
Admission: RE | Admit: 2018-12-21 | Discharge: 2018-12-21 | Disposition: A | Discharge status: Home / Self Care | Payer: Self-pay | Source: Ambulatory Visit | Attending: Family Medicine | Admitting: Family Medicine

## 2018-12-21 ENCOUNTER — Ambulatory Visit: Payer: Self-pay | Admitting: Family Medicine

## 2018-12-21 ENCOUNTER — Encounter: Payer: Self-pay | Admitting: Family Medicine

## 2018-12-21 VITALS — Temp 98.0°F | Wt 268.2 lb

## 2018-12-21 DIAGNOSIS — S96912A Strain of unspecified muscle and tendon at ankle and foot level, left foot, initial encounter: Secondary | ICD-10-CM

## 2018-12-21 DIAGNOSIS — S40011A Contusion of right shoulder, initial encounter: Secondary | ICD-10-CM | POA: Insufficient documentation

## 2018-12-21 DIAGNOSIS — M25511 Pain in right shoulder: Secondary | ICD-10-CM

## 2018-12-21 DIAGNOSIS — T148XXA Other injury of unspecified body region, initial encounter: Secondary | ICD-10-CM

## 2018-12-21 DIAGNOSIS — M79672 Pain in left foot: Secondary | ICD-10-CM

## 2018-12-21 MED ORDER — HYDROCODONE-ACETAMINOPHEN 5-325 MG PO TABS: 1.0000 | ORAL_TABLET | Freq: Four times a day (QID) | ORAL | 0 refills | Status: AC | PRN

## 2018-12-21 NOTE — Progress Notes (Signed)
   Subjective:    Patient ID: Summer [name], female    DOB: 15-Aug-1961, 58 y.o.   MRN: [id number]  Fall  The accident occurred 3 to 5 days ago. Fall occurred: taking friends dog out to use bathroom. She landed on grass. The point of impact was the neck, left foot and right shoulder (collarbone). Pain location: right clavicle; left foot. The pain is moderate. Associated symptoms include headaches. Pertinent negatives include no abdominal pain, fever or nausea. Associated symptoms comments: wheezing. She has tried NSAID for the symptoms.   Patient slipped and fell landed on her right shoulder clavicle pain also left foot pain this happened over a week ago still very tender painful hard to move about   Review of Systems  Constitutional: Negative for activity change, fatigue and fever.  HENT: Negative for congestion and rhinorrhea.   Respiratory: Negative for cough, chest tightness and shortness of breath.   Cardiovascular: Negative for chest pain and leg swelling.  Gastrointestinal: Negative for abdominal pain and nausea.  Skin: Negative for color change.  Neurological: Positive for headaches. Negative for dizziness.  Psychiatric/Behavioral: Negative for agitation and behavioral problems.       Objective:   Physical Exam Vitals signs reviewed.  Constitutional:      General: She is not in acute distress. HENT:     Head: Normocephalic.  Cardiovascular:     Rate and Rhythm: Normal rate and regular rhythm.     Heart sounds: Normal heart sounds. No murmur.  Pulmonary:     Effort: Pulmonary effort is normal.     Breath sounds: Normal breath sounds.  Lymphadenopathy:     Cervical: No cervical adenopathy.  Neurological:     Mental Status: She is alert.  Psychiatric:        Behavior: Behavior normal.           Assessment & Plan:  Clavicle tenderness Left foot tenderness X-rays ordered Pain medication for sparing use Caution drowsiness Follow-up if progressive troubles

## 2018-12-28 ENCOUNTER — Encounter (INDEPENDENT_AMBULATORY_CARE_PROVIDER_SITE_OTHER): Payer: Self-pay | Admitting: *Deleted

## 2019-01-02 ENCOUNTER — Ambulatory Visit: Payer: Self-pay | Admitting: Primary Care

## 2019-01-17 ENCOUNTER — Ambulatory Visit: Payer: Self-pay | Admitting: Family Medicine

## 2019-01-17 ENCOUNTER — Other Ambulatory Visit: Payer: Self-pay

## 2019-01-17 ENCOUNTER — Ambulatory Visit (INDEPENDENT_AMBULATORY_CARE_PROVIDER_SITE_OTHER): Payer: Self-pay | Admitting: Family Medicine

## 2019-01-17 ENCOUNTER — Telehealth: Payer: Self-pay | Admitting: *Deleted

## 2019-01-17 ENCOUNTER — Telehealth: Payer: Self-pay | Admitting: Family Medicine

## 2019-01-17 VITALS — Ht 64.0 in

## 2019-01-17 DIAGNOSIS — R6889 Other general symptoms and signs: Secondary | ICD-10-CM

## 2019-01-17 DIAGNOSIS — R059 Cough, unspecified: Secondary | ICD-10-CM

## 2019-01-17 DIAGNOSIS — R05 Cough: Secondary | ICD-10-CM

## 2019-01-17 LAB — POCT INFLUENZA A/B
INFLUENZA B, POC: NEGATIVE
Influenza A, POC: NEGATIVE

## 2019-01-17 NOTE — Telephone Encounter (Signed)
Pt called concerned because her roommate died two days ago of pneumonia and she has had cough and low grade fever for 3 days and sob since yesterday. Offered appt here today. Pt states she does not have a ride to the office because nobody wants to come near her and she does not drive. Advised pt to call for ambulance and go to ED. Pt verbalized understanding.

## 2019-01-17 NOTE — Progress Notes (Signed)
   Subjective:    Patient ID: Summer [name], female    DOB: 19-Feb-1961, 58 y.o.   MRN: [id number]  HPI Patient is here today with complaints of a non productive cough that started three days ago.  Fever that comes and goes checked last yesterday 100.2.  Shortness of breath started yesterday.States she is using neb machine and albuterol inhaler.  Body aches: This started three days ago also.  Sinus drainage:she states very little.  She states her roommate (83 yrs old in good health other than seizure) passed away of pneumonia two days ago. She had been sick for eight to nine days. The roommate had traveled to [place] two weeks ago.(no testing done that the patient knows of )   She has been taking Tylenol, and Vit C.  Review of Systems  Constitutional: Negative for activity change and fever.  HENT: Positive for congestion and rhinorrhea. Negative for ear pain.   Eyes: Negative for discharge.  Respiratory: Positive for cough. Negative for shortness of breath and wheezing.   Cardiovascular: Negative for chest pain.       Objective:   Physical Exam Vitals signs and nursing note reviewed.  Constitutional:      Appearance: She is well-developed.  HENT:     Head: Normocephalic.     Nose: Nose normal.     Mouth/Throat:     Pharynx: No oropharyngeal exudate.  Neck:     Musculoskeletal: Neck supple.  Cardiovascular:     Rate and Rhythm: Normal rate.     Heart sounds: Normal heart sounds. No murmur.  Pulmonary:     Effort: Pulmonary effort is normal.     Breath sounds: Normal breath sounds. No wheezing.  Lymphadenopathy:     Cervical: No cervical adenopathy.  Skin:    General: Skin is warm and dry.           Assessment & Plan:  I find no evidence of pneumonia on exam I do not recommend x-rays or lab testing currently Very significant extenuating circumstances Roommate died of pneumonia fairly quickly along with patient having flu like symptoms yet a negative flu  test Therefore I would recommend coronavirus testing order was given she will go through the drive-through testing in [place] regarding coronavirus given I do not feel patient needs to go to the ER currently.  Warnings were discussed in detail.

## 2019-01-26 ENCOUNTER — Telehealth: Payer: Self-pay | Admitting: Family Medicine

## 2019-01-26 NOTE — Telephone Encounter (Signed)
I called [facility]) and they state the test normally takes 3-4 days,but with the country increased testing they are back logged. They say the results should be ready tonight or tomorrow.They state if the test should come back positive they notify the ordering Dr and the health dept. The test is now being ran at the [place] testing site. I spoke with the patient made her aware that as we get information she would be notified. She states she is feeling a lot better. I advised that she still needed to shelter in placed until she hears differently. She states understanding.

## 2019-01-26 NOTE — Telephone Encounter (Signed)
Patient checking on COVID-19 testing. Not back yet.

## 2019-01-28 NOTE — Telephone Encounter (Signed)
Her test is still pending at 9 AM on 01/28/2019

## 2019-01-29 NOTE — Telephone Encounter (Signed)
Results discussed with patient. Patient advised test came back negative. Patient verbalized understanding.

## 2019-01-29 NOTE — Telephone Encounter (Signed)
Test came back negative, see lab result note

## 2019-02-05 LAB — NOVEL CORONAVIRUS, NAA: SARS-CoV-2, NAA: NOT DETECTED

## 2019-02-20 ENCOUNTER — Encounter (HOSPITAL_COMMUNITY): Payer: Self-pay | Admitting: Emergency Medicine

## 2019-02-20 ENCOUNTER — Other Ambulatory Visit: Payer: Self-pay

## 2019-02-20 ENCOUNTER — Emergency Department (HOSPITAL_COMMUNITY)
Admission: EM | Admit: 2019-02-20 | Discharge: 2019-02-20 | Disposition: A | Discharge status: Home / Self Care | Payer: Self-pay | Attending: Emergency Medicine | Admitting: Emergency Medicine

## 2019-02-20 DIAGNOSIS — R609 Edema, unspecified: Secondary | ICD-10-CM | POA: Insufficient documentation

## 2019-02-20 DIAGNOSIS — M25571 Pain in right ankle and joints of right foot: Secondary | ICD-10-CM | POA: Insufficient documentation

## 2019-02-20 NOTE — ED Triage Notes (Signed)
Pt /o of right ankle pain with redness and swelling x 4 days

## 2019-02-20 NOTE — Discharge Instructions (Addendum)
Your blood pressure is slightly elevated at 177/95, otherwise your vital signs within normal limits.  Your oxygen level is normal at 96% on room air.  Your examination is negative for any neurologic or vascular deficit involving your right lower extremity.  There is no sores or ulcers or signs of an infection.  There is no significant asymmetry that would suggest a deep vein thrombus/blood clot.  Your examination and your history is consistent with dependent edema.  When you are sitting, please have your feet on pillows above your waist.  When you are lying down please have your legs elevated above your heart.  Please use the ankle stirrup splint to assist with comfort.  You do not need to sleep in this device.  Please see Dr. Wolfgang Phoenix if this problem continues.  Return to the emergency department if any changes in your condition, worsening of symptoms, problems, or concerns.

## 2019-02-20 NOTE — ED Provider Notes (Signed)
[facility] Provider Note   CSN: [id number] Arrival date & time: 02/20/19  1700    History   Chief Complaint Chief Complaint  Patient presents with  . Ankle Pain    HPI Summer [name] is a 58 y.o. female.     Patient is a 58 year old female who presents to the emergency department with complaint of right ankle pain and swelling.  The patient states this problem started about 4 days ago.  She noted swelling mostly of the right foot and ankle.  She denies any recent injury or trauma to the area.  No recent operations or procedures.  Patient states she has some pain when she applies pressure at times.  She does not remember twisting the area.  She is not stepped on anything that she is aware of.  She presents now for assistance with this issue.    The history is provided by the patient.  Ankle Pain  Associated symptoms: no back pain and no neck pain     Past Medical History:  Diagnosis Date  . Arthritis   . Bell's palsy   . CHF (congestive heart failure) ([place])   . Dysrhythmia, cardiac   . Fibromyalgia   . Hashimoto's disease   . History of CT scan 06/2017   "coronary CT scan on 06/01/2017, this did not find any cardiac calcifications. Coronary calcium score of zero."  . Hypertension   . Thyroid disease     Patient Active Problem List   Diagnosis Date Noted  . Postmenopausal bleeding 08/10/2018  . Palpitations 10/09/2016  . Prediabetes 06/10/2014  . Subclinical hypothyroidism 06/10/2014  . Abdominal pain, other specified site 12/26/2013  . Esophageal reflux 04/01/2013  . Fibromyalgia 03/22/2013  . Hypokalemia 03/22/2013  . Facial paralysis/Bells palsy 02/01/2013    Past Surgical History:  Procedure Laterality Date  . COLONOSCOPY N/A 01/23/2014   Procedure: COLONOSCOPY;  Surgeon: Rogene Houston, MD;  Location: [facility];  Service: Endoscopy;  Laterality: N/A;  200  . TONSILLECTOMY       OB History    Gravida  3   Para  1   Term  1   Preterm      AB  2   Living  1     SAB  1   TAB      Ectopic      Multiple      Live Births  1            Home Medications    Prior to Admission medications   Medication Sig Start Date End Date Taking? Authorizing Provider  acyclovir (ZOVIRAX) 800 MG tablet Take 800 mg by mouth 2 (two) times daily.    [provider]  albuterol (PROVENTIL) (2.5 MG/3ML) 0.083% nebulizer solution Take 3 mLs (2.5 mg total) by nebulization every 4 (four) hours as needed for wheezing. 09/26/18   Juanito Doom, MD  ALPRAZolam (XANAX) 0.5 MG tablet TAKE 1 TABLET BY MOUTH THREE TIMES DAILY AS NEEDED FOR SLEEP FOR ANXIETY 09/18/18   Kathyrn Drown, MD  Ascorbic Acid (VITAMIN C) 500 MG CAPS Take 1 capsule by mouth 2 (two) times daily.     [provider]  aspirin EC 81 MG tablet Take 81 mg by mouth daily.    [provider]  cephALEXin (KEFLEX) 500 MG capsule Take 1 capsule (500 mg total) by mouth 4 (four) times daily. Patient not taking: Reported on 12/21/2018 11/28/18   Sallee Lange  A, MD  Cholecalciferol (VITAMIN D-3) 5000 UNITS TABS Take 1 tablet by mouth daily.    [provider]  citalopram (CELEXA) 10 MG tablet Take 1 tablet (10 mg total) by mouth daily. 11/28/18   Kathyrn Drown, MD  fluticasone furoate-vilanterol (BREO ELLIPTA) 200-25 MCG/INH AEPB Inhale 1 puff into the lungs daily. 09/26/18   Juanito Doom, MD  Folic Acid-Vit Y8-XKG Y18 (HOMOCYSTEINE FORMULA) 0.8-50-0.1 MG TABS Take 1 tablet by mouth daily.    [provider]  furosemide (LASIX) 20 MG tablet TAKE 2 TABLETS BY MOUTH IN THE MORNING 09/18/18   Kathyrn Drown, MD  Magnesium 200 MG TABS Take 2 tablets by mouth 2 (two) times daily.     [provider]  Multiple Vitamin (MULTIVITAMIN WITH MINERALS) TABS tablet Take 1 tablet by mouth daily.    [provider]  nystatin (MYCOSTATIN) 100000 UNIT/ML suspension One tsp swish and spit four times per day  for seven days. Patient not taking: Reported on 11/28/2018 08/17/18   Kathyrn Drown, MD  potassium chloride SA (K-DUR,KLOR-CON) 20 MEQ tablet TAKE 1 TABLET BY MOUTH ONCE DAILY 09/18/18   Kathyrn Drown, MD  thyroid [facility] THYROID) 60 MG tablet Take 1 tablet (60 mg total) by mouth daily before breakfast. 08/04/18   Kathyrn Drown, MD    Family History Family History  Adopted: Yes  Problem Relation Age of Onset  . Cancer Mother        lung  . Other Daughter        Lyme's disease    Social History Social History   Tobacco Use  . Smoking status: Former Smoker    Packs/day: 1.00    Years: 20.00    Pack years: 20.00    Types: Cigarettes    Last attempt to quit: 11/08/2003    Years since quitting: 15.2  . Smokeless tobacco: Never Used  . Tobacco comment: quit smoking 10 yrs   Substance Use Topics  . Alcohol use: No  . Drug use: No     Allergies   Avelox [moxifloxacin hcl in nacl]; Bee venom; and Shellfish allergy   Review of Systems Review of Systems  Constitutional: Negative for activity change.       All ROS Neg except as noted in HPI  HENT: Negative for nosebleeds.   Eyes: Negative for photophobia and discharge.  Respiratory: Negative for cough, shortness of breath and wheezing.   Cardiovascular: Negative for chest pain and palpitations.  Gastrointestinal: Negative for abdominal pain and blood in stool.  Genitourinary: Negative for dysuria, frequency and hematuria.  Musculoskeletal: Positive for arthralgias. Negative for back pain and neck pain.  Skin: Negative.   Neurological: Negative for dizziness, seizures and speech difficulty.  Psychiatric/Behavioral: Negative for confusion and hallucinations. The patient is nervous/anxious.      Physical Exam Updated Vital Signs BP (!) 177/95   Pulse 94   Temp 98.9 F (37.2 C)   Resp 18   Ht 5\' 5"  (1.651 m)   Wt 108.9 kg   LMP 01/23/2014 Comment: spotting  SpO2 96%   BMI 39.94 kg/m   Physical Exam Vitals  signs and nursing note reviewed.  Constitutional:      Appearance: She is well-developed. She is not toxic-appearing.  HENT:     Head: Normocephalic.     Right Ear: Tympanic membrane and external ear normal.     Left Ear: Tympanic membrane and external ear normal.  Eyes:  General: Lids are normal.     Pupils: Pupils are equal, round, and reactive to light.  Neck:     Musculoskeletal: Normal range of motion and neck supple.     Vascular: No carotid bruit.  Cardiovascular:     Rate and Rhythm: Normal rate and regular rhythm.     Pulses: Normal pulses.     Heart sounds: Normal heart sounds.  Pulmonary:     Effort: No respiratory distress.     Breath sounds: Normal breath sounds.  Abdominal:     General: Bowel sounds are normal.     Palpations: Abdomen is soft.     Tenderness: There is no abdominal tenderness. There is no guarding.  Musculoskeletal: Normal range of motion.     Comments: There is good range of motion of the right hip and knee.  There is no deformity of the tibial area.  There is no pitting edema of the lower extremity.  Capillary refill is less than 2 seconds.  Dorsalis pedis pulse and posterior tibial pulses are 2+.  There are no changes in the temperature nor the size of the right versus the left lower extremity.  The Achilles tendon is intact.  There are no puncture wounds noted of the plantar surface of the right foot. Negative Homans sign  Lymphadenopathy:     Head:     Right side of head: No submandibular adenopathy.     Left side of head: No submandibular adenopathy.     Cervical: No cervical adenopathy.  Skin:    General: Skin is warm and dry.  Neurological:     Mental Status: She is alert and oriented to person, place, and time.     Cranial Nerves: No cranial nerve deficit.     Sensory: No sensory deficit.  Psychiatric:        Speech: Speech normal.      ED Treatments / Results  Labs (all labs ordered are listed, but only abnormal results are  displayed) Labs Reviewed - No data to display  EKG None  Radiology No results found.  Procedures Procedures (including critical care time)  Medications Ordered in ED Medications - No data to display   Initial Impression / Assessment and Plan / ED Course  I have reviewed the triage vital signs and the nursing notes.  Pertinent labs & imaging results that were available during my care of the patient were reviewed by me and considered in my medical decision making (see chart for details).          Final Clinical Impressions(s) / ED Diagnoses MDM  Blood pressure is elevated at 177/95, otherwise vital signs within normal limits.  Pulse oximetry is within normal limits at 96% on room air by my interpretation.  The patient has not had any recent falls, has not had injury to the ankle.  Has not twisted the ankle, has not stepped on any objects to have a foreign body in the foot, has not had any recent operations or procedures.  The examination is most consistent with peripheral edema.  The patient has multiple varicose veins present of the lower extremity.  I have asked the patient to keep her lower extremity elevated above her waist when sitting and above her heart when lying down.  I have asked her to decrease the salt content of her diet.  I have asked her to follow-up with Dr. Wolfgang Phoenix for additional evaluation concerning this issue.  Patient will return to the emergency department if  any emergent changes in her condition, problems, or concerns.   Final diagnoses:  Acute right ankle pain  Dependent edema    ED Discharge Orders    None       Lily Kocher, PA-C 02/20/19 [place], Ankit, MD 02/20/19 1859

## 2019-03-09 ENCOUNTER — Telehealth: Payer: Self-pay | Admitting: *Deleted

## 2019-03-09 NOTE — Telephone Encounter (Signed)
Pt having chest pain, dizziness since waking up yesterday. Worse today. Nausea started last night. Sob all the time but worse since the chest pain started. No fever. Pt advised to go to ED. She states she does not have insurance but I advised her she should go anyway since symptoms could be life threatening. She verbalized understanding. Tried to call Triage nurse at [facility] notified phone rang til it hung up.

## 2019-03-09 NOTE — Telephone Encounter (Signed)
With her symptomatology I agree that the patient should go to the ER

## 2019-05-22 ENCOUNTER — Other Ambulatory Visit: Payer: Self-pay | Admitting: Nurse Practitioner

## 2019-05-22 NOTE — Telephone Encounter (Signed)
Pt states she usually takes the nebulizer treatments. Pt states where it has been so hot, she feels as if she needs the inhaler since she can not take the neb machine with her. Please advise. Thank you

## 2019-07-14 ENCOUNTER — Other Ambulatory Visit: Payer: Self-pay | Admitting: Family Medicine

## 2019-07-17 NOTE — Telephone Encounter (Signed)
Patent made appointment for 9/30

## 2019-07-17 NOTE — Telephone Encounter (Signed)
LVM

## 2019-07-17 NOTE — Telephone Encounter (Signed)
May have this +1 refill needs follow-up office visit will also need lab work. If patient hesitant to come in then virtual visit

## 2019-08-01 ENCOUNTER — Ambulatory Visit (INDEPENDENT_AMBULATORY_CARE_PROVIDER_SITE_OTHER): Payer: BC Managed Care – PPO | Admitting: Family Medicine

## 2019-08-01 ENCOUNTER — Other Ambulatory Visit: Payer: Self-pay

## 2019-08-01 DIAGNOSIS — R635 Abnormal weight gain: Secondary | ICD-10-CM

## 2019-08-01 DIAGNOSIS — E038 Other specified hypothyroidism: Secondary | ICD-10-CM

## 2019-08-01 DIAGNOSIS — E039 Hypothyroidism, unspecified: Secondary | ICD-10-CM | POA: Diagnosis not present

## 2019-08-01 DIAGNOSIS — R7303 Prediabetes: Secondary | ICD-10-CM

## 2019-08-01 DIAGNOSIS — E063 Autoimmune thyroiditis: Secondary | ICD-10-CM | POA: Diagnosis not present

## 2019-08-01 NOTE — Progress Notes (Signed)
   Subjective:    Patient ID: Summer [name], female    DOB: 1961-09-24, 58 y.o.   MRN: [id number]  HPImed check up. Wants to get thyroid checked. Has gained weight and feels tired all the time.  Patient with significant fatigue tiredness feeling rundown taking her thyroid medicine but states her overall energy level is subpar she also relates how she is having significant troubles with giving out of energy when she moves around but denies any wheezing difficulty breathing Virtual Visit via Telephone Note  I connected with Summer [name] on 08/01/19 at  2:00 PM EDT by telephone and verified that I am speaking with the correct person using two identifiers.  Location: Patient: home Provider: office   I discussed the limitations, risks, security and privacy concerns of performing an evaluation and management service by telephone and the availability of in person appointments. I also discussed with the patient that there may be a patient responsible charge related to this service. The patient expressed understanding and agreed to proceed.   History of Present Illness:    Observations/Objective:   Assessment and Plan:   Follow Up Instructions:    I discussed the assessment and treatment plan with the patient. The patient was provided an opportunity to ask questions and all were answered. The patient agreed with the plan and demonstrated an understanding of the instructions.   The patient was advised to call back or seek an in-person evaluation if the symptoms worsen or if the condition fails to improve as anticipated.  I provided 15 minutes of non-face-to-face time during this encounter.    Review of Systems  Constitutional: Positive for fatigue and unexpected weight change. Negative for activity change and appetite change.  HENT: Negative for congestion and rhinorrhea.   Respiratory: Negative for cough and shortness of breath.   Cardiovascular: Negative for chest pain and leg  swelling.  Gastrointestinal: Negative for abdominal pain, nausea and vomiting.  Skin: Negative for color change.  Neurological: Negative for dizziness and weakness.  Psychiatric/Behavioral: Negative for agitation and confusion.       Objective:   Physical Exam  Today's visit was via telephone Physical exam was not possible for this visit       Assessment & Plan:  Fatigue tiredness weight gain It is quite possible looking at her previous labs at a wellness facility did that she may well have PCOS and this could be contributing to her weight gain She also has underlying thyroid condition Also has underlying Hashimoto's  Patient is interested in doing lab work but currently wants to wait on ordering it until she can see if she can afford it TSH, free T4, metabolic 7, 123456 Also thyroid globulin antibody Thyroid peroxidase antibody

## 2019-08-07 ENCOUNTER — Telehealth: Payer: Self-pay | Admitting: Family Medicine

## 2019-08-07 DIAGNOSIS — E038 Other specified hypothyroidism: Secondary | ICD-10-CM

## 2019-08-07 DIAGNOSIS — R7303 Prediabetes: Secondary | ICD-10-CM

## 2019-08-07 DIAGNOSIS — E063 Autoimmune thyroiditis: Secondary | ICD-10-CM

## 2019-08-07 DIAGNOSIS — E039 Hypothyroidism, unspecified: Secondary | ICD-10-CM

## 2019-08-07 NOTE — Telephone Encounter (Signed)
She has worked out a deal with [facility] for payment and wants to go ahead and have her labwork done.  Needs lab orders put in.

## 2019-08-08 NOTE — Telephone Encounter (Signed)
TSH, free T4, metabolic 7, 123456 Also thyroid globulin antibody Thyroid peroxidase antibody  These are the test that the patient would like to have completed  Reason hypothyroidism, Hashimoto's, hyperglycemia

## 2019-08-09 DIAGNOSIS — E039 Hypothyroidism, unspecified: Secondary | ICD-10-CM | POA: Diagnosis not present

## 2019-08-09 DIAGNOSIS — R7303 Prediabetes: Secondary | ICD-10-CM | POA: Diagnosis not present

## 2019-08-09 DIAGNOSIS — E063 Autoimmune thyroiditis: Secondary | ICD-10-CM | POA: Diagnosis not present

## 2019-08-09 NOTE — Telephone Encounter (Signed)
Blood work ordered in Epic. Patient notified. 

## 2019-08-09 NOTE — Telephone Encounter (Signed)
dont worry about that particular one

## 2019-08-10 LAB — BASIC METABOLIC PANEL
BUN/Creatinine Ratio: 21 (ref 9–23)
BUN: 15 mg/dL (ref 6–24)
CO2: 27 mmol/L (ref 20–29)
Calcium: 9.7 mg/dL (ref 8.7–10.2)
Chloride: 101 mmol/L (ref 96–106)
Creatinine, Ser: 0.72 mg/dL (ref 0.57–1.00)
GFR calc Af Amer: 108 mL/min/{1.73_m2} (ref >59)
GFR calc non Af Amer: 94 mL/min/{1.73_m2} (ref >59)
Glucose: 98 mg/dL (ref 65–99)
Potassium: 4.6 mmol/L (ref 3.5–5.2)
Sodium: 139 mmol/L (ref 134–144)

## 2019-08-10 LAB — THYROID PEROXIDASE ANTIBODY: Thyroperoxidase Ab SerPl-aCnc: >600 IU/mL — ABNORMAL HIGH (ref 0–34)

## 2019-08-10 LAB — T4, FREE: Free T4: 1.09 ng/dL (ref 0.82–1.77)

## 2019-08-10 LAB — TSH: TSH: 4.16 u[IU]/mL (ref 0.450–4.500)

## 2019-08-10 LAB — HEMOGLOBIN A1C
Est. average glucose Bld gHb Est-mCnc: 131 mg/dL
Hgb A1c MFr Bld: 6.2 % — ABNORMAL HIGH (ref 4.8–5.6)

## 2020-01-23 ENCOUNTER — Other Ambulatory Visit: Payer: Self-pay | Admitting: Family Medicine

## 2020-01-23 DIAGNOSIS — E039 Hypothyroidism, unspecified: Secondary | ICD-10-CM

## 2020-01-24 NOTE — Telephone Encounter (Signed)
May have 3 months needs to do a follow-up visit lab work coming up

## 2020-01-25 NOTE — Telephone Encounter (Signed)
Please contact patient to set up appt; then may route back to nurses. Thank you  

## 2020-01-25 NOTE — Telephone Encounter (Signed)
Phone visit scheduled, pt states she currently does not have insurance & would like to know cost of labs, told her the nurse may could tell her what tests were being ordered so she can call the lab to check cost   Please advise

## 2020-01-25 NOTE — Telephone Encounter (Signed)
Taking a minimal approach TSH only,we can do A1C in office-cheaper

## 2020-01-28 NOTE — Telephone Encounter (Signed)
Tried to call no answer

## 2020-02-12 ENCOUNTER — Telehealth: Payer: Self-pay | Admitting: Family Medicine

## 2020-02-12 NOTE — Telephone Encounter (Signed)
I am not opposed to ordering her lab work through [facility] if they are willing to draw

## 2020-02-12 NOTE — Telephone Encounter (Signed)
Pt is unable to get thyroid lab work done. [facility] is refusing to do lab work due to her not having insurance.   She found online this thing called [facility] where she can order her own lab work and have [facility] process it. She is not sure if this is something that is legit and wanted to know what her options would be to get her thyroid checked.

## 2020-02-12 NOTE — Telephone Encounter (Signed)
Patient states [facility] and they will not do her labs without paying it -[facility] will draw her labs but wants her to pay up front- patient will call [facility] back tomorrow to see how much it will be for a TSH and decide what she can do

## 2020-02-14 ENCOUNTER — Ambulatory Visit: Payer: BC Managed Care – PPO | Admitting: Family Medicine

## 2020-02-21 ENCOUNTER — Telehealth: Payer: Self-pay | Admitting: Family Medicine

## 2020-02-22 ENCOUNTER — Telehealth: Payer: Self-pay | Admitting: *Deleted

## 2020-02-22 NOTE — Telephone Encounter (Signed)
Patient called in stating she spoke to [place] [facility] and they advised her there was an order for TSH, t4, t3 and reverse t3 basically everything together where she would only pay 1 price verses having them all separate. With her not having insurance she is interested in doing it that way. Okay to order this? I can check with [facility] and see exactly what the order is

## 2020-02-23 NOTE — Telephone Encounter (Signed)
It is okay with me that you proceed forward with doing that thank you

## 2020-02-25 ENCOUNTER — Other Ambulatory Visit: Payer: Self-pay | Admitting: *Deleted

## 2020-02-25 DIAGNOSIS — R739 Hyperglycemia, unspecified: Secondary | ICD-10-CM

## 2020-02-25 DIAGNOSIS — E063 Autoimmune thyroiditis: Secondary | ICD-10-CM

## 2020-02-25 DIAGNOSIS — E039 Hypothyroidism, unspecified: Secondary | ICD-10-CM

## 2020-02-25 NOTE — Telephone Encounter (Signed)
Called main [facility] and what sounded to the most similar test was the thyroid panel w/tsh. Patient was notified of the lab work and cost. Per [facility] it was $182 but if patient advised them she didn't have insurance and paid the day of the draw it would be $89. Patient stated she was going to have labs done tomorrow.

## 2020-02-27 LAB — THYROID PANEL WITH TSH
Free Thyroxine Index: 1.8 (ref 1.2–4.9)
T3 Uptake Ratio: 25 % (ref 24–39)
T4, Total: 7.1 ug/dL (ref 4.5–12.0)
TSH: 5.79 u[IU]/mL — ABNORMAL HIGH (ref 0.450–4.500)

## 2020-02-29 ENCOUNTER — Telehealth: Payer: Self-pay | Admitting: Family Medicine

## 2020-02-29 ENCOUNTER — Other Ambulatory Visit: Payer: Self-pay

## 2020-02-29 ENCOUNTER — Telehealth (INDEPENDENT_AMBULATORY_CARE_PROVIDER_SITE_OTHER): Payer: Self-pay | Admitting: Family Medicine

## 2020-02-29 DIAGNOSIS — E063 Autoimmune thyroiditis: Secondary | ICD-10-CM

## 2020-02-29 DIAGNOSIS — E039 Hypothyroidism, unspecified: Secondary | ICD-10-CM

## 2020-02-29 MED ORDER — CYCLOBENZAPRINE HCL 10 MG PO TABS: ORAL_TABLET | ORAL | 3 refills | Status: DC

## 2020-02-29 NOTE — Telephone Encounter (Signed)
Ms. karagan, sirkin are scheduled for a virtual visit with your provider today.    Just as we do with appointments in the office, we must obtain your consent to participate.  Your consent will be active for this visit and any virtual visit you may have with one of our providers in the next 365 days.    If you have a MyChart account, I can also send a copy of this consent to you electronically.  All virtual visits are billed to your insurance company just like a traditional visit in the office.  As this is a virtual visit, video technology does not allow for your provider to perform a traditional examination.  This may limit your provider's ability to fully assess your condition.  If your provider identifies any concerns that need to be evaluated in person or the need to arrange testing such as labs, EKG, etc, we will make arrangements to do so.    Although advances in technology are sophisticated, we cannot ensure that it will always work on either your end or our end.  If the connection with a video visit is poor, we may have to switch to a telephone visit.  With either a video or telephone visit, we are not always able to ensure that we have a secure connection.   I need to obtain your verbal consent now.   Are you willing to proceed with your visit today?   BENJI CABALLEROS has provided verbal consent on 02/29/2020 for a virtual visit (video or telephone).   Vicente Males, LPN [id number]  [id number] PM

## 2020-02-29 NOTE — Progress Notes (Signed)
   Subjective:    Patient ID: Summer [name], female    DOB: 1961/04/19, 59 y.o.   MRN: [id number]  HPI Pt is needing refill on all chronic meds. Pt states she is taking all meds as prescribed. No issues or concerns. Pt is also needing to go over recent lab results.  I reviewed over the labs with the patient Results for orders placed or performed in visit on 02/25/20  Thyroid Panel With TSH  Result Value Ref Range   TSH 5.790 (H) 0.450 - 4.500 uIU/mL   T4, Total 7.1 4.5 - 12.0 ug/dL   T3 Uptake Ratio 25 24 - 39 %   Free Thyroxine Index 1.8 1.2 - 4.9    Virtual Visit via Telephone Note  I connected with Summer [name] on 02/29/20 at  2:00 PM EDT by telephone and verified that I am speaking with the correct person using two identifiers.  Location: Patient: home Provider: office   I discussed the limitations, risks, security and privacy concerns of performing an evaluation and management service by telephone and the availability of in person appointments. I also discussed with the patient that there may be a patient responsible charge related to this service. The patient expressed understanding and agreed to proceed.   History of Present Illness:    Observations/Objective:   Assessment and Plan:   Follow Up Instructions:    I discussed the assessment and treatment plan with the patient. The patient was provided an opportunity to ask questions and all were answered. The patient agreed with the plan and demonstrated an understanding of the instructions.   The patient was advised to call back or seek an in-person evaluation if the symptoms worsen or if the condition fails to improve as anticipated.  I provided 20 minutes of non-face-to-face time during this encounter.       Review of Systems     Objective:   Physical Exam  Today's visit was via telephone Physical exam was not possible for this visit       Assessment & Plan:  Significant fatigue with subpar  compliance She forgets her doses of thyroid medicine I did tell her she could take 1 late in the afternoon if necessary if she forgets it in the morning She will strive to be much more compliant She will repeat lab work again in a few months and follow-up in 6 months Continue current dosage of medicine

## 2020-03-23 ENCOUNTER — Other Ambulatory Visit: Payer: Self-pay | Admitting: Family Medicine

## 2020-03-24 ENCOUNTER — Telehealth: Payer: Self-pay | Admitting: Family Medicine

## 2020-03-24 MED ORDER — THYROID 60 MG PO TABS: ORAL_TABLET | ORAL | 5 refills | Status: DC

## 2020-03-24 MED ORDER — POTASSIUM CHLORIDE CRYS ER 20 MEQ PO TBCR: 20.0000 meq | EXTENDED_RELEASE_TABLET | Freq: Every day | ORAL | 5 refills | Status: DC

## 2020-03-24 MED ORDER — FUROSEMIDE 20 MG PO TABS: ORAL_TABLET | ORAL | 5 refills | Status: DC

## 2020-03-24 NOTE — Telephone Encounter (Signed)
Medications sent to pharmacy and pt is aware 

## 2020-03-24 NOTE — Telephone Encounter (Signed)
Med check up on 4/30. Thyroid med sent in today for only 30 day supply and lasix and potassium last sent in for one year supply nov 2019 so I called pt to see if she was still taking those and she said yes. Is it ok to fill all three with refills  [facility] [place]

## 2020-03-24 NOTE — Telephone Encounter (Signed)
Patient was seen on 4/30 with Dr Nicki Reaper, patient is calling she needs her meds for NP THYROID 60 MG tablet , potassium chloride SA (K-DUR,KLOR-CON) 20 MEQ , furosemide (LASIX) 20 MG tablet said nothing was sent over to the pharmacy.

## 2020-03-24 NOTE — Telephone Encounter (Signed)
May have 6 months on those refills

## 2020-10-20 ENCOUNTER — Telehealth: Payer: Self-pay | Admitting: Adult Health

## 2020-10-20 NOTE — Telephone Encounter (Signed)
Pt called concerned with having a period, not had one in years, states it's medium to light - mild to moderate cramping - some quarter size clots  We have her scheduled for 11/06/2020 but pt wasn't sure she'd be ok until then, please advise pt

## 2020-10-20 NOTE — Telephone Encounter (Signed)
Returned patient's call.  Unable to leave voicemail. 

## 2020-10-21 ENCOUNTER — Telehealth: Payer: Self-pay | Admitting: *Deleted

## 2020-10-21 NOTE — Telephone Encounter (Signed)
VM not set up. If patient calls back, she is ok to wait until appointment on 1/6.

## 2020-10-28 ENCOUNTER — Encounter: Payer: Self-pay | Admitting: Family Medicine

## 2020-10-28 ENCOUNTER — Ambulatory Visit (INDEPENDENT_AMBULATORY_CARE_PROVIDER_SITE_OTHER): Payer: Self-pay | Admitting: Family Medicine

## 2020-10-28 ENCOUNTER — Other Ambulatory Visit: Payer: Self-pay

## 2020-10-28 VITALS — BP 122/74 | HR 92 | Temp 97.6°F | Ht 65.0 in | Wt 301.8 lb

## 2020-10-28 DIAGNOSIS — K625 Hemorrhage of anus and rectum: Secondary | ICD-10-CM | POA: Insufficient documentation

## 2020-10-28 DIAGNOSIS — N3001 Acute cystitis with hematuria: Secondary | ICD-10-CM

## 2020-10-28 DIAGNOSIS — R824 Acetonuria: Secondary | ICD-10-CM

## 2020-10-28 LAB — POCT URINALYSIS DIPSTICK (MANUAL)
Leukocytes, UA: NEGATIVE
Nitrite, UA: NEGATIVE
Poct Bilirubin: NEGATIVE
Poct Blood: 250 — AB
Poct Glucose: NORMAL mg/dL
Poct Protein: 30 mg/dL — AB
Poct Urobilinogen: NORMAL mg/dL
Spec Grav, UA: 1.02 (ref 1.010–1.025)
pH, UA: 6 (ref 5.0–8.0)

## 2020-10-28 LAB — HEMOCCULT GUIAC POC 1CARD (OFFICE): Fecal Occult Blood, POC: POSITIVE — AB

## 2020-10-28 LAB — POCT GLYCOSYLATED HEMOGLOBIN (HGB A1C): Hemoglobin A1C: 5.5 % (ref 4.0–5.6)

## 2020-10-28 LAB — GLUCOSE, POCT (MANUAL RESULT ENTRY): POC Glucose: 129 mg/dl — AB (ref 70–99)

## 2020-10-28 LAB — POCT HEMOGLOBIN: Hemoglobin: 16 g/dL — AB (ref 11–14.6)

## 2020-10-28 MED ORDER — NITROFURANTOIN MONOHYD MACRO 100 MG PO CAPS: 100.0000 mg | ORAL_CAPSULE | Freq: Two times a day (BID) | ORAL | 0 refills | Status: DC

## 2020-10-28 NOTE — Progress Notes (Signed)
Patient ID: Summer [name], female    DOB: [birth year], 59 y.o.   MRN: [id number]   No chief complaint on file.  Subjective:  CC: vaginal bleeding and rectal bleeding  This is a new problem.  Presents today with a complaint of feeling dizzy, weak, washed out, tired, vaginal and rectal bleeding, symptoms present for 2 weeks.  Patient is postmenopausal, has an appointment to see GYN on 11/07/2019.  Does have a history of hemorrhoids, started a keto diet approximately 2 weeks ago as well.  Her last colonoscopy was 5 to 6 years ago she reports she had 2 to polyps removed.  She is reporting that she has flank back pain as well.  Denies fever, chills, has chest pain or shortness of breath this is not a new problem, this has been worked up years ago by cardiology and had found nothing to cause her symptoms.  This has not worsened at this time.  Reports that the night before the bleeding started she did have a bout of diarrhea.   pt states she is passing blood rectally, vaginally and in her urine. Started 2 weeks ago. Sees gyn on January 6th for vaginal bleeding.   Pt states she almost passed out yesterday and has been feeling weak.   Results for orders placed or performed in visit on 10/28/20  POCT hemoglobin  Result Value Ref Range   Hemoglobin 16.0 (A) 11 - 14.6 g/dL  POCT Urinalysis Dip Manual  Result Value Ref Range   Spec Grav, UA 1.020 1.010 - 1.025   pH, UA 6.0 5.0 - 8.0   Leukocytes, UA Negative Negative   Nitrite, UA Negative Negative   Poct Protein +30 (A) Negative, trace mg/dL   Poct Glucose Normal Normal mg/dL   Poct Ketones + small (A) Negative   Poct Urobilinogen Normal Normal mg/dL   Poct Bilirubin Negative Negative   Poct Blood =250 (A) Negative, trace   ASCORBIC ACID, POC +   POCT Glucose (CBG)  Result Value Ref Range   POC Glucose 129 (A) 70 - 99 mg/dl  POCT glycosylated hemoglobin (Hb A1C)  Result Value Ref Range   Hemoglobin A1C 5.5 4.0 - 5.6 %   HbA1c POC (<>  result, manual entry)     HbA1c, POC (prediabetic range)     HbA1c, POC (controlled diabetic range)    POCT occult blood stool  Result Value Ref Range   Fecal Occult Blood, POC Positive (A) Negative   Card #1 Date     Card #2 Fecal Occult Blod, POC     Card #2 Date     Card #3 Fecal Occult Blood, POC     Card #3 Date       Medical History Summer [name] has a past medical history of Arthritis, Bell's palsy, CHF (congestive heart failure) (HCC), Dysrhythmia, cardiac, Fibromyalgia, Hashimoto's disease, History of CT scan (06/2017), Hypertension, and Thyroid disease.   Outpatient Encounter Medications as of 10/28/2020  Medication Sig  . albuterol (PROVENTIL) (2.5 MG/3ML) 0.083% nebulizer solution Take 3 mLs (2.5 mg total) by nebulization every 4 (four) hours as needed for wheezing.  Marland Kitchen albuterol (VENTOLIN HFA) 108 (90 Base) MCG/ACT inhaler INHALE TWO PUFFS BY MOUTH EVERY 4 HOURS INTO  THE  LUNGS  AS  NEEDED  . ALPRAZolam (XANAX) 0.5 MG tablet TAKE 1 TABLET BY MOUTH THREE TIMES DAILY AS NEEDED FOR SLEEP FOR ANXIETY  . Ascorbic Acid (VITAMIN C) 500 MG CAPS Take 1 capsule by mouth 2 (  two) times daily.   Marland Kitchen aspirin EC 81 MG tablet Take 81 mg by mouth daily.  . Cholecalciferol (VITAMIN D-3) 5000 UNITS TABS Take 1 tablet by mouth daily.  . Folic Acid-Vit Q000111Q 123456 0.8-50-0.1 MG TABS Take 1 tablet by mouth daily.  . furosemide (LASIX) 20 MG tablet TAKE 2 TABLETS BY MOUTH IN THE MORNING  . Magnesium 200 MG TABS Take 2 tablets by mouth 2 (two) times daily.   . Multiple Vitamin (MULTIVITAMIN WITH MINERALS) TABS tablet Take 1 tablet by mouth daily.  . Multiple Vitamins-Minerals (ZINC PO) Take by mouth.  . nitrofurantoin, macrocrystal-monohydrate, (MACROBID) 100 MG capsule Take 1 capsule (100 mg total) by mouth 2 (two) times daily.  . potassium chloride SA (KLOR-CON) 20 MEQ tablet Take 1 tablet (20 mEq total) by mouth daily.  Marland Kitchen thyroid (NP THYROID) 60 MG tablet TAKE 1 TABLET BY MOUTH ONCE DAILY BEFORE  BREAKFAST  . [DISCONTINUED] cyclobenzaprine (FLEXERIL) 10 MG tablet Take one tablet po q hs prn   No facility-administered encounter medications on file as of 10/28/2020.     Review of Systems  Constitutional: Positive for chills and fatigue. Negative for fever.  HENT: Negative for ear pain.   Respiratory: Positive for shortness of breath.   Cardiovascular: Positive for chest pain.       Not new- has been to card. Has heart failure.Not currently under the care of card. Years ago.   Gastrointestinal: Positive for anal bleeding, blood in stool and diarrhea. Negative for abdominal pain, nausea and vomiting.  Neurological: Positive for dizziness and weakness.     Vitals BP 122/74   Pulse 92   Temp 97.6 F (36.4 C)   Ht 5\' 5"  (1.651 m)   Wt (!) 301 lb 12.8 oz (136.9 kg)   LMP 01/23/2014 Comment: spotting  SpO2 96%   BMI 50.22 kg/m   Objective:   Physical Exam Vitals and nursing note reviewed. Exam conducted with a chaperone present.  Constitutional:      General: She is not in acute distress.    Appearance: Normal appearance.  Cardiovascular:     Rate and Rhythm: Normal rate and regular rhythm.     Heart sounds: Normal heart sounds.  Pulmonary:     Effort: Pulmonary effort is normal.     Breath sounds: Normal breath sounds.  Abdominal:     General: Bowel sounds are normal.     Palpations: Abdomen is soft.     Tenderness: There is abdominal tenderness in the right lower quadrant and left lower quadrant. There is right CVA tenderness and left CVA tenderness. There is no guarding or rebound.     Comments: Describes abdominal pain as cramping.  Genitourinary:    Rectum: Guaiac result positive. External hemorrhoid present.  Skin:    General: Skin is warm and dry.  Neurological:     General: No focal deficit present.     Mental Status: She is alert.  Psychiatric:        Behavior: Behavior normal.     Results for orders placed or performed in visit on 10/28/20  POCT  hemoglobin  Result Value Ref Range   Hemoglobin 16.0 (A) 11 - 14.6 g/dL  POCT Urinalysis Dip Manual  Result Value Ref Range   Spec Grav, UA 1.020 1.010 - 1.025   pH, UA 6.0 5.0 - 8.0   Leukocytes, UA Negative Negative   Nitrite, UA Negative Negative   Poct Protein +30 (A) Negative, trace mg/dL   Poct  Glucose Normal Normal mg/dL   Poct Ketones + small (A) Negative   Poct Urobilinogen Normal Normal mg/dL   Poct Bilirubin Negative Negative   Poct Blood =250 (A) Negative, trace   ASCORBIC ACID, POC +   POCT Glucose (CBG)  Result Value Ref Range   POC Glucose 129 (A) 70 - 99 mg/dl  POCT glycosylated hemoglobin (Hb A1C)  Result Value Ref Range   Hemoglobin A1C 5.5 4.0 - 5.6 %   HbA1c POC (<> result, manual entry)     HbA1c, POC (prediabetic range)     HbA1c, POC (controlled diabetic range)    POCT occult blood stool  Result Value Ref Range   Fecal Occult Blood, POC Positive (A) Negative   Card #1 Date     Card #2 Fecal Occult Blod, POC     Card #2 Date     Card #3 Fecal Occult Blood, POC     Card #3 Date      Assessment and Plan   1. Rectal bleeding - POCT hemoglobin - POCT Urinalysis Dip Manual - POCT occult blood stool  2. Ketonuria - POCT Glucose (CBG) - POCT glycosylated hemoglobin (Hb A1C)  3. Acute cystitis with hematuria - nitrofurantoin, macrocrystal-monohydrate, (MACROBID) 100 MG capsule; Take 1 capsule (100 mg total) by mouth 2 (two) times daily.  Dispense: 20 capsule; Refill: 0   Vital signs stable. Point-of-care hemoglobin 16.0 Glucose 129 Point-of-care A1c 5.5 Urine: Positive protein, ketones, blood Right and left CVA tenderness Fecal occult positive blood Small external hemorrhoid, no obvious bleeding. Postmenopausal bleeding: Has appointment with GYN on 11/06/2020. Appears stable, will not send to emergency department today.  Warning signs discussed to proceed to the emergency department.  Due to blood in the urine, and the left and right CVA  tenderness, will treat for urinary tract infection.  Plan to discuss with Dr. Sallee Lange for next steps.  Patient reports she is uninsured, willing to get lab work and go to a GI specialist, will call [facility] tomorrow to see if she can work out a Agricultural consultant.  She will see GYN for the vaginal bleeding in January.  Agrees with plan of care discussed today. Understands warning signs to seek further care: Chest pain, shortness of breath, significant bleeding, any significant change in health, any worrisome symptoms. Understands to follow-up in 1 week, will discuss with Dr. Sallee Lange, and follow-up with patient as soon as possible.  She will bring back a stool sample to test for blood.  Pecolia Ades, FNP-C 10/28/2020

## 2020-10-29 ENCOUNTER — Telehealth: Payer: Self-pay

## 2020-10-29 DIAGNOSIS — K625 Hemorrhage of anus and rectum: Secondary | ICD-10-CM

## 2020-10-29 DIAGNOSIS — R824 Acetonuria: Secondary | ICD-10-CM

## 2020-10-29 DIAGNOSIS — E039 Hypothyroidism, unspecified: Secondary | ICD-10-CM

## 2020-10-29 DIAGNOSIS — Z79899 Other long term (current) drug therapy: Secondary | ICD-10-CM

## 2020-10-29 DIAGNOSIS — R7303 Prediabetes: Secondary | ICD-10-CM

## 2020-10-29 LAB — IFOBT (OCCULT BLOOD): IFOBT: POSITIVE

## 2020-10-29 NOTE — Addendum Note (Signed)
Addended by: Marlowe Shores on: 10/29/2020 03:36 PM   Modules accepted: Orders

## 2020-10-29 NOTE — Telephone Encounter (Signed)
The delay for me (really) was that she is uninsured and she said she wanted to check to see if the lab would work with her for payment. I did discuss her with Dr. Lorin Picket (but not to see if I should order labs) :)  Please order:  CBC CMP TSH A1C U/A (done at the lab) not another dipstick at our office (need to verify blood in her urine) please instruct her to try to not let any vaginal blood get in the urine if possible....  GI referral for rectal bleeding She already has GYN appointment scheduled (please verify that she is going to keep that appointment)  Thank you very much! KD

## 2020-10-29 NOTE — Telephone Encounter (Signed)
Lab orders placed and pt is aware. GI referral placed, pt prefers Dr.Rehman. Pt is going to gyn next week.

## 2020-10-29 NOTE — Telephone Encounter (Signed)
Pt seen Clydie Braun the other day and was mention on having blood work done she wants to have her Thyroid checked as well. Pt said Clydie Braun would have to check with Dr Lorin Picket   Pt call back [phone]

## 2020-10-30 ENCOUNTER — Telehealth: Payer: Self-pay | Admitting: Family Medicine

## 2020-10-30 ENCOUNTER — Encounter (INDEPENDENT_AMBULATORY_CARE_PROVIDER_SITE_OTHER): Payer: Self-pay | Admitting: *Deleted

## 2020-10-30 ENCOUNTER — Encounter (HOSPITAL_COMMUNITY): Payer: Self-pay

## 2020-10-30 ENCOUNTER — Other Ambulatory Visit: Payer: Self-pay

## 2020-10-30 DIAGNOSIS — K625 Hemorrhage of anus and rectum: Secondary | ICD-10-CM | POA: Insufficient documentation

## 2020-10-30 DIAGNOSIS — Z7982 Long term (current) use of aspirin: Secondary | ICD-10-CM | POA: Insufficient documentation

## 2020-10-30 DIAGNOSIS — I110 Hypertensive heart disease with heart failure: Secondary | ICD-10-CM | POA: Insufficient documentation

## 2020-10-30 DIAGNOSIS — K219 Gastro-esophageal reflux disease without esophagitis: Secondary | ICD-10-CM | POA: Insufficient documentation

## 2020-10-30 DIAGNOSIS — I509 Heart failure, unspecified: Secondary | ICD-10-CM | POA: Insufficient documentation

## 2020-10-30 DIAGNOSIS — Z87891 Personal history of nicotine dependence: Secondary | ICD-10-CM | POA: Insufficient documentation

## 2020-10-30 DIAGNOSIS — N281 Cyst of kidney, acquired: Secondary | ICD-10-CM | POA: Insufficient documentation

## 2020-10-30 DIAGNOSIS — Z79899 Other long term (current) drug therapy: Secondary | ICD-10-CM | POA: Insufficient documentation

## 2020-10-30 DIAGNOSIS — N939 Abnormal uterine and vaginal bleeding, unspecified: Secondary | ICD-10-CM | POA: Insufficient documentation

## 2020-10-30 LAB — URINE CULTURE

## 2020-10-30 LAB — URINALYSIS, ROUTINE W REFLEX MICROSCOPIC
Bacteria, UA: NONE SEEN
Bilirubin Urine: NEGATIVE
Glucose, UA: NEGATIVE mg/dL
Ketones, ur: 80 mg/dL — AB
Leukocytes,Ua: NEGATIVE
Nitrite: NEGATIVE
Protein, ur: NEGATIVE mg/dL
Specific Gravity, Urine: 1.019 (ref 1.005–1.030)
pH: 5 (ref 5.0–8.0)

## 2020-10-30 NOTE — Telephone Encounter (Signed)
NP wanted pt to have lab work due to bleeding. Pt called [facility] 1-800 number and was informed to tell [facility] that she was a "hardship patient". Pt states [facility] in our building would not draw her labs because the lady there did not know what she was talking about. Informed pt that there was a [facility] on [address] that she could go to and see if they would be able to accommodate her. Pt informed if they could, call us back. Pt verbalized understanding.

## 2020-10-30 NOTE — Telephone Encounter (Signed)
Pt calling to let us know that she did have her labs done at [facility] Dr. Gerald Stabs. Pt is worried because as she was getting her labs done, the tech told her that her husband tested positive for COVID on Christmas. Both were wearing a mask and pt states she was in the lab about 15 minutes. Pt is worried. Please advise. Thank you

## 2020-10-30 NOTE — ED Triage Notes (Signed)
Pt reports blood in urine, bleeding from rectum and vagina x 2 weeks. Pt says she saw her dr today and had lab work done today and urinalysis. Pt says that she has had no reports of abnormalities in blood work. Pt prescribed Macrobid yesterday.

## 2020-10-30 NOTE — Telephone Encounter (Signed)
*  in previous message, there was typo. Pt informed if they could, call us back. It should have said Pt informed if they could not, call us back*  Pt called back and states that [facility] [facility] would not draw labs either. Pt states she called 1-800 number for [facility] again. They told her that if she has a bill in the system they could set her up on payment arrangements but if she walks in with no insurance, they can not help her. Please advise. Thank you

## 2020-10-30 NOTE — ED Notes (Signed)
No answer at triage x1

## 2020-10-31 ENCOUNTER — Emergency Department (HOSPITAL_COMMUNITY): Payer: Self-pay

## 2020-10-31 ENCOUNTER — Emergency Department (HOSPITAL_COMMUNITY)
Admission: EM | Admit: 2020-10-31 | Discharge: 2020-10-31 | Disposition: A | Discharge status: Home / Self Care | Payer: Self-pay | Attending: Emergency Medicine | Admitting: Emergency Medicine

## 2020-10-31 DIAGNOSIS — R319 Hematuria, unspecified: Secondary | ICD-10-CM

## 2020-10-31 DIAGNOSIS — N281 Cyst of kidney, acquired: Secondary | ICD-10-CM

## 2020-10-31 DIAGNOSIS — R109 Unspecified abdominal pain: Secondary | ICD-10-CM

## 2020-10-31 DIAGNOSIS — N939 Abnormal uterine and vaginal bleeding, unspecified: Secondary | ICD-10-CM

## 2020-10-31 DIAGNOSIS — K625 Hemorrhage of anus and rectum: Secondary | ICD-10-CM

## 2020-10-31 LAB — COMPREHENSIVE METABOLIC PANEL
ALT: 21 U/L (ref 0–44)
AST: 23 U/L (ref 15–41)
Albumin: 4.4 g/dL (ref 3.5–5.0)
Alkaline Phosphatase: 74 U/L (ref 38–126)
Anion gap: 13 (ref 5–15)
BUN: 15 mg/dL (ref 6–20)
CO2: 24 mmol/L (ref 22–32)
Calcium: 9.4 mg/dL (ref 8.9–10.3)
Chloride: 103 mmol/L (ref 98–111)
Creatinine, Ser: 0.75 mg/dL (ref 0.44–1.00)
GFR, Estimated: >60 mL/min (ref >60)
Glucose, Bld: 117 mg/dL — ABNORMAL HIGH (ref 70–99)
Potassium: 3.4 mmol/L — ABNORMAL LOW (ref 3.5–5.1)
Sodium: 140 mmol/L (ref 135–145)
Total Bilirubin: 1.2 mg/dL (ref 0.3–1.2)
Total Protein: 7.6 g/dL (ref 6.5–8.1)

## 2020-10-31 LAB — CBC
HCT: 52.9 % — ABNORMAL HIGH (ref 36.0–46.0)
Hemoglobin: 17.2 g/dL — ABNORMAL HIGH (ref 12.0–15.0)
MCH: 29.7 pg (ref 26.0–34.0)
MCHC: 32.5 g/dL (ref 30.0–36.0)
MCV: 91.4 fL (ref 80.0–100.0)
Platelets: 283 10*3/uL (ref 150–400)
RBC: 5.79 MIL/uL — ABNORMAL HIGH (ref 3.87–5.11)
RDW: 15.5 % (ref 11.5–15.5)
WBC: 8.6 10*3/uL (ref 4.0–10.5)
nRBC: 0 % (ref 0.0–0.2)

## 2020-10-31 LAB — LIPASE, BLOOD: Lipase: 21 U/L (ref 11–51)

## 2020-10-31 MED ORDER — IOHEXOL 300 MG/ML  SOLN
100.0000 mL | Freq: Once | INTRAMUSCULAR | Status: AC | PRN
  Administered 2020-10-31: 100 mL via INTRAVENOUS

## 2020-10-31 NOTE — ED Provider Notes (Signed)
[facility] EMERGENCY DEPARTMENT Provider Note   CSN: [id number] Arrival date & time: 10/30/20  1924     History Chief Complaint  Patient presents with  . Hematuria    Rectal and vaginal bleeding as well per pt    Summer [name] is a 59 y.o. female.  The history is provided by the patient.  Abdominal Pain Pain location:  Suprapubic Pain quality: aching   Pain severity:  Moderate Onset quality:  Gradual Duration:  1 day Timing:  Intermittent Progression:  Worsening Chronicity:  New Relieved by:  Nothing Worsened by:  Nothing Associated symptoms: hematuria   Associated symptoms: no fever   Patient reports over the past day she has had increasing lower abdominal pain.  No fevers or vomiting.  No diarrhea.  Patient reports she has had recent hematuria, vaginal bleeding and rectal bleeding.  She has been seen by her PCP for this and has follow-up with GYN as well as gastroenterology in the future She is not on anticoagulation  She was recently found to have a UTI and is on antibiotics    Past Medical History:  Diagnosis Date  . Arthritis   . Bell's palsy   . CHF (congestive heart failure) (HCC)   . Dysrhythmia, cardiac   . Fibromyalgia   . Hashimoto's disease   . History of CT scan 06/2017   "coronary CT scan on 06/01/2017, this did not find any cardiac calcifications. Coronary calcium score of zero."  . Hypertension   . Thyroid disease     Patient Active Problem List   Diagnosis Date Noted  . Acute cystitis with hematuria 10/28/2020  . Ketonuria 10/28/2020  . Rectal bleeding 10/28/2020  . Postmenopausal bleeding 08/10/2018  . Palpitations 10/09/2016  . Prediabetes 06/10/2014  . Subclinical hypothyroidism 06/10/2014  . Abdominal pain, other specified site 12/26/2013  . Esophageal reflux 04/01/2013  . Fibromyalgia 03/22/2013  . Hypokalemia 03/22/2013  . Facial paralysis/Bells palsy 02/01/2013    Past Surgical History:  Procedure Laterality Date  .  COLONOSCOPY N/A 01/23/2014   Procedure: COLONOSCOPY;  Surgeon: Malissa Hippo, MD;  Location: [facility] ENDO SUITE;  Service: Endoscopy;  Laterality: N/A;  200  . TONSILLECTOMY       OB History    Gravida  3   Para  1   Term  1   Preterm      AB  2   Living  1     SAB  1   IAB      Ectopic      Multiple      Live Births  1           Family History  Adopted: Yes  Problem Relation Age of Onset  . Cancer Mother        lung  . Other Daughter        Lyme's disease    Social History   Tobacco Use  . Smoking status: Former Smoker    Packs/day: 1.00    Years: 20.00    Pack years: 20.00    Types: Cigarettes    Quit date: 11/08/2003    Years since quitting: 16.9  . Smokeless tobacco: Never Used  . Tobacco comment: quit smoking 10 yrs   Vaping Use  . Vaping Use: Never used  Substance Use Topics  . Alcohol use: No  . Drug use: No    Home Medications Prior to Admission medications   Medication Sig Start Date End Date Taking? Authorizing  Provider  albuterol (PROVENTIL) (2.5 MG/3ML) 0.083% nebulizer solution Take 3 mLs (2.5 mg total) by nebulization every 4 (four) hours as needed for wheezing. 09/26/18   Juanito Doom, MD  albuterol (VENTOLIN HFA) 108 (90 Base) MCG/ACT inhaler INHALE TWO PUFFS BY MOUTH EVERY 4 HOURS INTO  THE  LUNGS  AS  NEEDED 05/22/19   Kathyrn Drown, MD  ALPRAZolam (XANAX) 0.5 MG tablet TAKE 1 TABLET BY MOUTH THREE TIMES DAILY AS NEEDED FOR SLEEP FOR ANXIETY 09/18/18   Kathyrn Drown, MD  Ascorbic Acid (VITAMIN C) 500 MG CAPS Take 1 capsule by mouth 2 (two) times daily.     [provider]  aspirin EC 81 MG tablet Take 81 mg by mouth daily.    [provider]  Cholecalciferol (VITAMIN D-3) 5000 UNITS TABS Take 1 tablet by mouth daily.    [provider]  Folic Acid-Vit Q000111Q 123456 0.8-50-0.1 MG TABS Take 1 tablet by mouth daily.    [provider]  furosemide (LASIX) 20 MG tablet TAKE 2 TABLETS BY MOUTH IN  THE MORNING 03/24/20   Kathyrn Drown, MD  Magnesium 200 MG TABS Take 2 tablets by mouth 2 (two) times daily.     [provider]  Multiple Vitamin (MULTIVITAMIN WITH MINERALS) TABS tablet Take 1 tablet by mouth daily.    [provider]  Multiple Vitamins-Minerals (ZINC PO) Take by mouth.    [provider]  nitrofurantoin, macrocrystal-monohydrate, (MACROBID) 100 MG capsule Take 1 capsule (100 mg total) by mouth 2 (two) times daily. 10/28/20   Chalmers Guest, NP  potassium chloride SA (KLOR-CON) 20 MEQ tablet Take 1 tablet (20 mEq total) by mouth daily. 03/24/20   Kathyrn Drown, MD  thyroid (NP THYROID) 60 MG tablet TAKE 1 TABLET BY MOUTH ONCE DAILY BEFORE BREAKFAST 03/24/20   Kathyrn Drown, MD    Allergies    Avelox [moxifloxacin hcl in nacl], Bee venom, and Shellfish allergy  Review of Systems   Review of Systems  Constitutional: Negative for fever.  Gastrointestinal: Positive for abdominal pain and blood in stool.  Genitourinary: Positive for hematuria.  All other systems reviewed and are negative.   Physical Exam Updated Vital Signs BP (!) 153/105 (BP Location: Right Arm)   Pulse 94   Temp 98 F (36.7 C) (Oral)   Resp 18   Ht 1.651 m (5\' 5" )   Wt (!) 136.1 kg   LMP 01/23/2014 Comment: spotting  SpO2 98%   BMI 49.93 kg/m   Physical Exam CONSTITUTIONAL: Well developed/well nourished HEAD: Normocephalic/atraumatic EYES: EOMI/PERRL ENMT: Mask in place NECK: supple no meningeal signs SPINE/BACK:entire spine nontender CV: S1/S2 noted, no murmurs/rubs/gallops noted LUNGS: Lungs are clear to auscultation bilaterally, no apparent distress ABDOMEN: soft, moderate suprapubic tenderness, no rebound or guarding, bowel sounds noted throughout abdomen.  Patient is obese GU:no cva tenderness NEURO: Pt is awake/alert/appropriate, moves all extremitiesx4.  No facial droop.   EXTREMITIES: pulses normal/equal, full ROM SKIN: warm, color normal PSYCH: no  abnormalities of mood noted, alert and oriented to situation  ED Results / Procedures / Treatments   Labs (all labs ordered are listed, but only abnormal results are displayed) Labs Reviewed  URINALYSIS, ROUTINE W REFLEX MICROSCOPIC - Abnormal; Notable for the following components:      Result Value   APPearance HAZY (*)    Hgb urine dipstick MODERATE (*)    Ketones, ur 80 (*)    All other components within normal  limits  COMPREHENSIVE METABOLIC PANEL - Abnormal; Notable for the following components:   Potassium 3.4 (*)    Glucose, Bld 117 (*)    All other components within normal limits  CBC - Abnormal; Notable for the following components:   RBC 5.79 (*)    Hemoglobin 17.2 (*)    HCT 52.9 (*)    All other components within normal limits  LIPASE, BLOOD    EKG None  Radiology CT ABDOMEN PELVIS W CONTRAST  Result Date: 10/31/2020 CLINICAL DATA:  59 year old female with acute abdominal pain, hematuria and bleeding from rectum and vagina for 2 weeks. Postprandial nausea. EXAM: CT ABDOMEN AND PELVIS WITH CONTRAST TECHNIQUE: Multidetector CT imaging of the abdomen and pelvis was performed using the standard protocol following bolus administration of intravenous contrast. CONTRAST:  177mL OMNIPAQUE IOHEXOL 300 MG/ML  SOLN COMPARISON:  CT Abdomen and Pelvis 09/06/2012. Report of [facility] CT Abdomen and Pelvis 10/24/2014 (no images available). FINDINGS: Lower chest: Cardiac size at the upper limits of normal. Negative lung bases, mild chronic linear scarring in the right lower lobe. Hepatobiliary: Negative liver and gallbladder. No bile duct enlargement. Pancreas: Negative. Spleen: Negative. Adrenals/Urinary Tract: Normal adrenal glands. Symmetric renal enhancement. No hydronephrosis. Symmetric renal contrast excretion. Chronic and benign appearing exophytic right renal lower pole cyst has simple fluid density, and is only modestly enlarged since 2013 (now 6 cm versus 2.6 cm 8  years ago). No nephrolithiasis. Both ureters appear normal to the bladder. Diminutive and unremarkable urinary bladder. Incidental chronic pelvic phleboliths. Stomach/Bowel: Large bowel is decompressed and normal. Normal appendix (series 2, image 56). Negative terminal ileum. No dilated or abnormal small bowel. Decompressed stomach and duodenum. No free air, free fluid, mesenteric stranding. Vascular/Lymphatic: Major vascular structures in the abdomen and pelvis appear to be patent including the portal venous system. No significant atherosclerosis identified. No lymphadenopathy. Reproductive: Negative. Other: No pelvic free fluid. Musculoskeletal: Chronic degeneration in the spine. No acute osseous abnormality identified. IMPRESSION: Negative. No acute or inflammatory process identified in the abdomen or pelvis. Electronically Signed   By: Genevie Ann M.D.   On: 10/31/2020 06:45    Procedures Procedures Medications Ordered in ED Medications  iohexol (OMNIPAQUE) 300 MG/ML solution 100 mL (has no administration in time range)    ED Course  I have reviewed the triage vital signs and the nursing notes.  Pertinent labs results that were available during my care of the patient were reviewed by me and considered in my medical decision making (see chart for details).    MDM Rules/Calculators/A&P                          5:37 AM Patient's biggest concern is the new abdominal pain.  She has had work-up and evaluation for the hematuria, vaginal bleeding and rectal bleeding.  She reports this is actually improving.  Labs overall reassuring.  However due to abdominal pain, will proceed with CT imaging.  If this is negative she can follow-up as an outpatient  7:04 AM Pt stable Labs overall reassuring CT scan is negative She was informed for renal cyst  I informed patient that priority should be to have her postmenopausal bleeding evaluated as this could represent cancer She can then follow-up with  gastroenterology.  If the hematuria continues she could then follow-up with urology Final Clinical Impression(s) / ED Diagnoses Final diagnoses:  Hematuria, unspecified type  Rectal bleeding  Vaginal bleeding  Abdominal pain, unspecified  abdominal location  Renal cyst    Rx / DC Orders ED Discharge Orders    None       Ripley Fraise, MD 10/31/20 [phone]

## 2020-10-31 NOTE — ED Notes (Signed)
ED Provider at bedside. 

## 2020-11-01 LAB — COMPREHENSIVE METABOLIC PANEL
ALT: 18 IU/L (ref 0–32)
AST: 22 IU/L (ref 0–40)
Albumin/Globulin Ratio: 1.8 (ref 1.2–2.2)
Albumin: 4.7 g/dL (ref 3.8–4.9)
Alkaline Phosphatase: 99 IU/L (ref 44–121)
BUN/Creatinine Ratio: 19 (ref 9–23)
BUN: 14 mg/dL (ref 6–24)
Bilirubin Total: 0.7 mg/dL (ref 0.0–1.2)
CO2: 24 mmol/L (ref 20–29)
Calcium: 9.9 mg/dL (ref 8.7–10.2)
Chloride: 100 mmol/L (ref 96–106)
Creatinine, Ser: 0.75 mg/dL (ref 0.57–1.00)
GFR calc Af Amer: 102 mL/min/{1.73_m2} (ref >59)
GFR calc non Af Amer: 89 mL/min/{1.73_m2} (ref >59)
Globulin, Total: 2.6 g/dL (ref 1.5–4.5)
Glucose: 94 mg/dL (ref 65–99)
Potassium: 4.2 mmol/L (ref 3.5–5.2)
Sodium: 139 mmol/L (ref 134–144)
Total Protein: 7.3 g/dL (ref 6.0–8.5)

## 2020-11-01 LAB — CBC WITH DIFFERENTIAL/PLATELET
Basophils Absolute: 0.1 10*3/uL (ref 0.0–0.2)
Basos: 1 %
EOS (ABSOLUTE): 0.2 10*3/uL (ref 0.0–0.4)
Eos: 3 %
Hematocrit: 52.2 % — ABNORMAL HIGH (ref 34.0–46.6)
Hemoglobin: 17.6 g/dL — ABNORMAL HIGH (ref 11.1–15.9)
Immature Grans (Abs): 0.1 10*3/uL (ref 0.0–0.1)
Immature Granulocytes: 1 %
Lymphocytes Absolute: 3.4 10*3/uL — ABNORMAL HIGH (ref 0.7–3.1)
Lymphs: 37 %
MCH: 30.6 pg (ref 26.6–33.0)
MCHC: 33.7 g/dL (ref 31.5–35.7)
MCV: 91 fL (ref 79–97)
Monocytes Absolute: 0.8 10*3/uL (ref 0.1–0.9)
Monocytes: 8 %
Neutrophils Absolute: 4.6 10*3/uL (ref 1.4–7.0)
Neutrophils: 50 %
Platelets: 277 10*3/uL (ref 150–450)
RBC: 5.76 x10E6/uL — ABNORMAL HIGH (ref 3.77–5.28)
RDW: 14.9 % (ref 11.7–15.4)
WBC: 9.1 10*3/uL (ref 3.4–10.8)

## 2020-11-01 LAB — URINALYSIS
Bilirubin, UA: NEGATIVE
Glucose, UA: NEGATIVE
Leukocytes,UA: NEGATIVE
Nitrite, UA: NEGATIVE
Protein,UA: NEGATIVE
Specific Gravity, UA: 1.017 (ref 1.005–1.030)
Urobilinogen, Ur: 0.2 mg/dL (ref 0.2–1.0)
pH, UA: 8.5 — ABNORMAL HIGH (ref 5.0–7.5)

## 2020-11-01 LAB — HEMOGLOBIN A1C
Est. average glucose Bld gHb Est-mCnc: 137 mg/dL
Hgb A1c MFr Bld: 6.4 % — ABNORMAL HIGH (ref 4.8–5.6)

## 2020-11-01 LAB — TSH: TSH: 3.9 u[IU]/mL (ref 0.450–4.500)

## 2020-11-02 ENCOUNTER — Other Ambulatory Visit: Payer: Self-pay | Admitting: Family Medicine

## 2020-11-02 DIAGNOSIS — B955 Unspecified streptococcus as the cause of diseases classified elsewhere: Secondary | ICD-10-CM | POA: Insufficient documentation

## 2020-11-02 DIAGNOSIS — B951 Streptococcus, group B, as the cause of diseases classified elsewhere: Secondary | ICD-10-CM

## 2020-11-02 DIAGNOSIS — N390 Urinary tract infection, site not specified: Secondary | ICD-10-CM

## 2020-11-02 MED ORDER — AMPICILLIN 500 MG PO CAPS: 500.0000 mg | ORAL_CAPSULE | Freq: Four times a day (QID) | ORAL | 0 refills | Status: DC

## 2020-11-02 NOTE — Progress Notes (Signed)
Antibiotic changed based on culture results. Dorena Bodo, NP

## 2020-11-05 ENCOUNTER — Ambulatory Visit (INDEPENDENT_AMBULATORY_CARE_PROVIDER_SITE_OTHER): Payer: Self-pay | Admitting: Family Medicine

## 2020-11-05 ENCOUNTER — Encounter: Payer: Self-pay | Admitting: Family Medicine

## 2020-11-05 ENCOUNTER — Other Ambulatory Visit: Payer: Self-pay

## 2020-11-05 VITALS — HR 89 | Temp 97.1°F | Ht 65.0 in | Wt 305.0 lb

## 2020-11-05 DIAGNOSIS — R059 Cough, unspecified: Secondary | ICD-10-CM

## 2020-11-05 DIAGNOSIS — R319 Hematuria, unspecified: Secondary | ICD-10-CM

## 2020-11-05 NOTE — Progress Notes (Signed)
Patient ID: Summer [name], female    DOB: 1960-11-11, 60 y.o.   MRN: [id number]   No chief complaint on file.  Subjective:  CC: follow-up for blood in urine  This is not a new problem.  Reports today for follow-up for blood in her urine, vaginal bleeding, rectal bleeding.  She went to the emergency department on December 31, had a CT scan at that time.  No acute findings at that time.  She reports that the vaginal and rectal bleeding have since resolved, she still seeing some blood in her urine.  She did start the clindamycin for her UTI yesterday, this was changed based on culture results.  She has not yet made the appointment for gastrointestinal, due to having Covid-like symptoms for 1 day she will reschedule her GYN appointment that is due for tomorrow.  She reports that after having her CT scan she felt chest and back burning, she was afraid this was a symptom of Covid.  The symptoms have resolved.  She denies fever, chills, chest pain, shortness of breath.   follow up. Pt states to listen to your lungs. Pt states she had covid symptoms starting on 1/1 and lasted 2 days. Did not have a covid test. States she went to testing site and it was closed due to weather.   Still having some blood in urine and bloating in abdomen. States she was getting a referral to GI doctor. And has appt tomorrow at gyn.    Medical History Summer [name] has a past medical history of Arthritis, Bell's palsy, CHF (congestive heart failure) ([place]), Dysrhythmia, cardiac, Fibromyalgia, Hashimoto's disease, History of CT scan (06/2017), Hypertension, and Thyroid disease.   Outpatient Encounter Medications as of 11/05/2020  Medication Sig  . albuterol (PROVENTIL) (2.5 MG/3ML) 0.083% nebulizer solution Take 3 mLs (2.5 mg total) by nebulization every 4 (four) hours as needed for wheezing.  Marland Kitchen albuterol (VENTOLIN HFA) 108 (90 Base) MCG/ACT inhaler INHALE TWO PUFFS BY MOUTH EVERY 4 HOURS INTO  THE  LUNGS  AS  NEEDED  .  ALPRAZolam (XANAX) 0.5 MG tablet TAKE 1 TABLET BY MOUTH THREE TIMES DAILY AS NEEDED FOR SLEEP FOR ANXIETY  . ampicillin (PRINCIPEN) 500 MG capsule Take 1 capsule (500 mg total) by mouth 4 (four) times daily.  . Ascorbic Acid (VITAMIN C) 500 MG CAPS Take 1 capsule by mouth 2 (two) times daily.   Marland Kitchen aspirin EC 81 MG tablet Take 81 mg by mouth daily.  . Cholecalciferol (VITAMIN D-3) 5000 UNITS TABS Take 1 tablet by mouth daily.  . Folic Acid-Vit Q000111Q 123456 0.8-50-0.1 MG TABS Take 1 tablet by mouth daily.  . furosemide (LASIX) 20 MG tablet TAKE 2 TABLETS BY MOUTH IN THE MORNING  . Magnesium 200 MG TABS Take 2 tablets by mouth 2 (two) times daily.   . Multiple Vitamin (MULTIVITAMIN WITH MINERALS) TABS tablet Take 1 tablet by mouth daily.  . Multiple Vitamins-Minerals (ZINC PO) Take by mouth.  . nitrofurantoin, macrocrystal-monohydrate, (MACROBID) 100 MG capsule Take 1 capsule (100 mg total) by mouth 2 (two) times daily.  . potassium chloride SA (KLOR-CON) 20 MEQ tablet Take 1 tablet (20 mEq total) by mouth daily.  Marland Kitchen thyroid (NP THYROID) 60 MG tablet TAKE 1 TABLET BY MOUTH ONCE DAILY BEFORE BREAKFAST   No facility-administered encounter medications on file as of 11/05/2020.     Review of Systems  Constitutional: Negative for chills and fever.  Respiratory: Negative for shortness of breath.   Cardiovascular:  Chest and back burning after CT scan  Gastrointestinal: Negative for abdominal pain, anal bleeding, blood in stool and rectal pain.       Rectal bleeding has resolved.  Genitourinary: Positive for hematuria. Negative for vaginal bleeding.       Vaginal bleeding has resolved.     Vitals Pulse 89   Temp (!) 97.1 F (36.2 C)   Ht 5\' 5"  (1.651 m)   Wt (!) 305 lb (138.3 kg)   LMP 01/23/2014 Comment: spotting  SpO2 95%   BMI 50.75 kg/m   Objective:   Physical Exam Vitals reviewed.  Constitutional:      Appearance: Normal appearance.  Cardiovascular:     Rate and Rhythm:  Normal rate and regular rhythm.     Heart sounds: Normal heart sounds.  Pulmonary:     Effort: Pulmonary effort is normal.     Breath sounds: Normal breath sounds.  Skin:    General: Skin is warm and dry.  Neurological:     General: No focal deficit present.     Mental Status: She is alert.  Psychiatric:        Behavior: Behavior normal.      Assessment and Plan   1. Cough - Novel Coronavirus, NAA ([facility])  2. Hematuria, unspecified type   Due to 1 to 2 days of cough, chest burning will check for Covid infection to be sure.  Symptoms have now resolved.  Was seen in the emergency department on December 31, with abdominal pain.  CT scan was done at that time, no acute findings.  Due to urine culture, antibiotic was changed.  Reports that she started the new antibiotic yesterday.  She will follow-up after this antibiotic is complete and we will recheck her urine for blood.  If she continues to have hematuria, will refer to urology.  Reports that vaginal and rectal bleeding has resolved, she has appointment with GYN tomorrow for evaluation and GI referral has been initiated-fecal occult blood test was positive..  Lab work completed no obvious signs of infection or anemia.  TSH within normal limits.  Kidney and liver functions normal.  This information was shared with the patient.  Agrees with plan of care discussed today. Understands warning signs to seek further care: Chest pain, shortness of breath, blood in stool, any significant change in health. Understands to follow-up with GYN, GI, this office after antibiotic completed, sooner if needed.  08-21-1981, FNP-C 11/05/2020

## 2020-11-05 NOTE — Telephone Encounter (Signed)
Sorry I did not address this 6 days ago... I tested her today for Covid. Doubt she has it. KD

## 2020-11-06 ENCOUNTER — Encounter: Payer: Self-pay | Admitting: Adult Health

## 2020-11-06 ENCOUNTER — Ambulatory Visit (INDEPENDENT_AMBULATORY_CARE_PROVIDER_SITE_OTHER): Payer: Self-pay | Admitting: Adult Health

## 2020-11-06 VITALS — BP 162/93 | HR 96 | Ht 65.0 in | Wt 304.5 lb

## 2020-11-06 DIAGNOSIS — N950 Postmenopausal bleeding: Secondary | ICD-10-CM

## 2020-11-06 NOTE — Progress Notes (Signed)
  Subjective:     Patient ID: Summer [name], female   DOB: 1961/08/15, 60 y.o.   MRN: [id number]  HPI Summer [name] is a 60 year old white female, married, PM in complaining of vaginal bleeding for about 2 weeks, none now. She was seen in ER for vaginal and rectal bleeding and blood in her urine and postprandial nausea. Treated for strep B in urine and is supposed to see GI.  See saw Summer Bodo NP yesterday in follow up. Summer [name] had PMB in  08/10/2018 and had endometrial biopsy then, showing no malignancy and inactive endometrial breakdown. Last pap was 07/10/18 negative for malignancy and HRHPV. PCP is Summer [name].   Review of Systems Has had vaginal bleeding for about 2 weeks, seems to have stopped,  Also had rectal bleeding and blood in urine Had used sex toy about 2 days before this started Being treated for Strep B in urine  Reviewed past medical,surgical, social and family history. Reviewed medications and allergies.     Objective:   Physical Exam BP (!) 162/93 (BP Location: Left Arm, Patient Position: Sitting, Cuff Size: Large)   Pulse 96   Ht 5\' 5"  (1.651 m)   Wt (!) 304 lb 8 oz (138.1 kg)   LMP 01/23/2014 Comment: spotting  BMI 50.67 kg/m  Skin warm and dry.Pelvic: external genitalia is normal in appearance no lesions, vagina: scant tan discharge without odor, no tears or lesions seen,urethra has no lesions or masses noted, cervix:smooth and bulbous, uterus: normal size, shape and contour, + tender, no masses felt, adnexa: no masses or tenderness noted. Bladder is non tender and no masses felt.  Fall risk is moderate  [place] - 11/06/20 1447      Pregnancy Intention Screening   Does the patient want to become pregnant in the next year? N/A    Does the patient's partner want to become pregnant in the next year? N/A    Would the patient like to discuss contraceptive options today? N/A      Contraception Wrap Up   Current Method No Method - Other Reason   postmenopausal    End Method No Method - Other Reason   postmenopausal   Contraception Counseling Provided No         Examination chaperoned by 01/04/21 Summer [name]    Assessment:     1. PMB (postmenopausal bleeding) Pelvic Summer [name] 11/13/20 at 12:30 pm at [facility]     Plan:     We will talk when [facility] results back, she is aware that if thickened endometrium will need endometrial biopsy Consider applying for [facility],as she is self pay  Follow up with GI

## 2020-11-07 LAB — NOVEL CORONAVIRUS, NAA: SARS-CoV-2, NAA: NOT DETECTED

## 2020-11-07 LAB — SARS-COV-2, NAA 2 DAY TAT

## 2020-11-13 ENCOUNTER — Ambulatory Visit (HOSPITAL_COMMUNITY)
Admission: RE | Admit: 2020-11-13 | Discharge: 2020-11-13 | Disposition: A | Discharge status: Home / Self Care | Payer: Self-pay | Source: Ambulatory Visit | Attending: Adult Health | Admitting: Adult Health

## 2020-11-13 DIAGNOSIS — N950 Postmenopausal bleeding: Secondary | ICD-10-CM | POA: Insufficient documentation

## 2020-11-14 ENCOUNTER — Other Ambulatory Visit: Payer: Self-pay

## 2020-11-14 ENCOUNTER — Other Ambulatory Visit (INDEPENDENT_AMBULATORY_CARE_PROVIDER_SITE_OTHER): Payer: Self-pay | Admitting: *Deleted

## 2020-11-14 DIAGNOSIS — R300 Dysuria: Secondary | ICD-10-CM

## 2020-11-14 LAB — POCT URINALYSIS DIPSTICK OB
Blood, UA: NEGATIVE
Glucose, UA: NEGATIVE
Leukocytes, UA: NEGATIVE
Nitrite, UA: NEGATIVE
POC,PROTEIN,UA: NEGATIVE

## 2020-11-14 NOTE — Progress Notes (Signed)
   NURSE VISIT- UTI SYMPTOMS   SUBJECTIVE:  Summer [name] is a 60 y.o. [id number] female here for UTI symptoms. She is a GYN patient. She reports dysuria, flank pain bilaterally and lower abdominal pain.  OBJECTIVE:  LMP 01/23/2014 Comment: spotting  Appears well, in no apparent distress  Results for orders placed or performed in visit on 11/14/20 (from the past 24 hour(s))  POC Urinalysis Dipstick OB   Collection Time: 11/14/20 12:07 PM  Result Value Ref Range   Color, UA     Clarity, UA     Glucose, UA Negative Negative   Bilirubin, UA     Ketones, UA small    Spec Grav, UA     Blood, UA neg    pH, UA     POC,PROTEIN,UA Negative Negative, Trace, Small (1+), Moderate (2+), Large (3+), 4+   Urobilinogen, UA     Nitrite, UA neg    Leukocytes, UA Negative Negative   Appearance     Odor      ASSESSMENT: GYN patient with UTI symptoms and negative nitrites  PLAN: Note routed to Summer [name], AGNP   Rx sent by provider today: No Urine culture sent Call or return to clinic prn if these symptoms worsen or fail to improve as anticipated. Follow-up: as scheduled   Summer [name]  11/14/2020 12:07 PM

## 2020-11-14 NOTE — Progress Notes (Signed)
Chart reviewed for nurse visit. Agree with plan of care.  Derrek Monaco A, NP 11/14/2020 1:15 PM

## 2020-11-22 LAB — URINE CULTURE

## 2020-11-24 ENCOUNTER — Telehealth: Payer: Self-pay | Admitting: Adult Health

## 2020-11-24 MED ORDER — NITROFURANTOIN MONOHYD MACRO 100 MG PO CAPS: 100.0000 mg | ORAL_CAPSULE | Freq: Two times a day (BID) | ORAL | 0 refills | Status: DC

## 2020-11-24 NOTE — Telephone Encounter (Signed)
Pt aware Macrobid is sensitive to this bacteria so it should take care of it. Pt was advised to take all of med until gone. Pt voiced understanding. [place]

## 2020-11-24 NOTE — Telephone Encounter (Signed)
Patient wants to discuss urine culture results with Derrek Monaco. Clinical staff will follow up patient.

## 2020-11-24 NOTE — Telephone Encounter (Signed)
Left message for pt that urine culture + Rx sent to [place] for Macrobid

## 2020-11-25 ENCOUNTER — Other Ambulatory Visit: Payer: Self-pay | Admitting: Obstetrics & Gynecology

## 2020-12-08 ENCOUNTER — Ambulatory Visit (INDEPENDENT_AMBULATORY_CARE_PROVIDER_SITE_OTHER): Payer: Self-pay | Admitting: Obstetrics & Gynecology

## 2020-12-08 ENCOUNTER — Other Ambulatory Visit: Payer: Self-pay

## 2020-12-08 ENCOUNTER — Other Ambulatory Visit: Payer: Self-pay | Admitting: Obstetrics & Gynecology

## 2020-12-08 ENCOUNTER — Encounter: Payer: Self-pay | Admitting: Obstetrics & Gynecology

## 2020-12-08 VITALS — BP 155/98 | HR 99 | Wt 303.0 lb

## 2020-12-08 DIAGNOSIS — N950 Postmenopausal bleeding: Secondary | ICD-10-CM

## 2020-12-08 DIAGNOSIS — R399 Unspecified symptoms and signs involving the genitourinary system: Secondary | ICD-10-CM

## 2020-12-08 NOTE — Progress Notes (Signed)
Endometrial Biopsy Procedure Note  Pre-operative Diagnosis: Post menopausal bleeding with thickened endometrium on sonogram  Post-operative Diagnosis: same  Indications: postmenopausal bleeding  Procedure Details   Urine pregnancy test was not done.  The risks (including infection, bleeding, pain, and uterine perforation) and benefits of the procedure were explained to the patient and Written informed consent was obtained.  Antibiotic prophylaxis against endocarditis was not indicated.   The patient was placed in the dorsal lithotomy position.  Bimanual exam showed the uterus to be in the neutral position.  A Graves' speculum inserted in the vagina, and the cervix prepped with povidone iodine.  Endocervical curettage with a Kevorkian curette was not performed.   A sharp tenaculum was applied to the anterior lip of the cervix for stabilization.  A sterile uterine sound was used to sound the uterus to a depth of 6.5 cm.  A Pipelle endometrial aspirator was used to sample the endometrium.  Sample was sent for pathologic examination.  Condition: Stable  Complications: None  Plan:  The patient was advised to call for any fever or for prolonged or severe pain or bleeding. She was advised to use OTC analgesics as needed for mild to moderate pain. She was advised to avoid vaginal intercourse for 48 hours or until the bleeding has completely stopped.  Attending Physician Documentation: I was present for or performed the following: endometrial biopsy   

## 2020-12-10 ENCOUNTER — Telehealth: Payer: Self-pay

## 2020-12-10 LAB — URINE CULTURE

## 2020-12-10 NOTE — Telephone Encounter (Signed)
Returned your call.

## 2020-12-10 NOTE — Telephone Encounter (Signed)
-----   Message from Estill Dooms, NP sent at 12/10/2020  8:41 AM EST ----- Let pt know urine did not grow any dominant organism

## 2020-12-10 NOTE — Telephone Encounter (Signed)
Informed pt of urine cx results. Pt confirmed understanding.

## 2020-12-15 ENCOUNTER — Encounter: Payer: Self-pay | Admitting: Obstetrics & Gynecology

## 2020-12-15 ENCOUNTER — Other Ambulatory Visit: Payer: Self-pay

## 2020-12-15 ENCOUNTER — Ambulatory Visit (INDEPENDENT_AMBULATORY_CARE_PROVIDER_SITE_OTHER): Payer: Self-pay | Admitting: Obstetrics & Gynecology

## 2020-12-15 VITALS — BP 166/90 | HR 84 | Ht 65.0 in | Wt 304.0 lb

## 2020-12-15 DIAGNOSIS — N950 Postmenopausal bleeding: Secondary | ICD-10-CM

## 2020-12-15 DIAGNOSIS — R109 Unspecified abdominal pain: Secondary | ICD-10-CM

## 2020-12-15 MED ORDER — MEDROXYPROGESTERONE ACETATE 10 MG PO TABS: 10.0000 mg | ORAL_TABLET | Freq: Every day | ORAL | 11 refills | Status: DC

## 2020-12-15 NOTE — Progress Notes (Signed)
Follow up appointment for results  Chief Complaint  Patient presents with  . Results    Blood pressure (!) 166/90, pulse 84, height 5\' 5"  (1.651 m), weight (!) 304 lb (137.9 kg), last menstrual period 01/23/2014.    Endometrial biopsy reveals proliferative endometrium without atypia hyperplasia or malignancy  MEDS ordered this encounter: No orders of the defined types were placed in this encounter.   Orders for this encounter: No orders of the defined types were placed in this encounter.   Impression: 1. PMB (postmenopausal bleeding)    Plan: Cyclical progesterone for 3 months then reassess her bleeding pattern  Follow Up: No follow-ups on file.    Meds ordered this encounter  Medications  . medroxyPROGESTERone (PROVERA) 10 MG tablet    Sig: Take 1 tablet (10 mg total) by mouth daily. The first 10 days of the month each month    Dispense:  30 tablet    Refill:  11      Face to face time:  15 minutes  Greater than 50% of the visit time was spent in counseling and coordination of care with the patient.  The summary and outline of the counseling and care coordination is summarized in the note above.   All questions were answered.  Past Medical History:  Diagnosis Date  . Arthritis   . Bell's palsy   . CHF (congestive heart failure) ([place])   . Dysrhythmia, cardiac   . Fibromyalgia   . Hashimoto's disease   . History of CT scan 06/2017   "coronary CT scan on 06/01/2017, this did not find any cardiac calcifications. Coronary calcium score of zero."  . Hypertension   . Thyroid disease     Past Surgical History:  Procedure Laterality Date  . COLONOSCOPY N/A 01/23/2014   Procedure: COLONOSCOPY;  Surgeon: Rogene Houston, MD;  Location: [facility];  Service: Endoscopy;  Laterality: N/A;  200  . TONSILLECTOMY      OB History    Gravida  3   Para  1   Term  1   Preterm      AB  2   Living  1     SAB  1   IAB      Ectopic      Multiple       Live Births  1           Allergies  Allergen Reactions  . Avelox [Moxifloxacin Hcl In Nacl] Other (See Comments)    "heart beats weird"  . Bee Venom Hives  . Shellfish Allergy Hives    Social History   Socioeconomic History  . Marital status: Married    Spouse name: Not on file  . Number of children: Not on file  . Years of education: Not on file  . Highest education level: Not on file  Occupational History  . Not on file  Tobacco Use  . Smoking status: Former Smoker    Packs/day: 1.00    Years: 20.00    Pack years: 20.00    Types: Cigarettes    Quit date: 11/08/2003    Years since quitting: 17.1  . Smokeless tobacco: Never Used  . Tobacco comment: quit smoking 10 yrs   Vaping Use  . Vaping Use: Never used  Substance and Sexual Activity  . Alcohol use: No  . Drug use: No  . Sexual activity: Yes    Birth control/protection: Post-menopausal  Other Topics Concern  . Not on file  Social History Narrative  . Not on file   Social Determinants of Health   Financial Resource Strain: Not on file  Food Insecurity: Not on file  Transportation Needs: Not on file  Physical Activity: Not on file  Stress: Not on file  Social Connections: Not on file    Family History  Adopted: Yes  Problem Relation Age of Onset  . Cancer Mother        lung  . Other Daughter        Lyme's disease

## 2020-12-17 ENCOUNTER — Encounter: Payer: Self-pay | Admitting: Family Medicine

## 2020-12-17 ENCOUNTER — Ambulatory Visit (INDEPENDENT_AMBULATORY_CARE_PROVIDER_SITE_OTHER): Payer: Self-pay | Admitting: Family Medicine

## 2020-12-17 ENCOUNTER — Other Ambulatory Visit: Payer: Self-pay

## 2020-12-17 ENCOUNTER — Other Ambulatory Visit (HOSPITAL_COMMUNITY)
Admission: RE | Admit: 2020-12-17 | Discharge: 2020-12-17 | Disposition: A | Discharge status: Home / Self Care | Payer: Self-pay | Source: Ambulatory Visit | Attending: Family Medicine | Admitting: Family Medicine

## 2020-12-17 VITALS — BP 154/93 | HR 102 | Temp 97.7°F | Wt 300.8 lb

## 2020-12-17 DIAGNOSIS — R079 Chest pain, unspecified: Secondary | ICD-10-CM | POA: Insufficient documentation

## 2020-12-17 DIAGNOSIS — R1012 Left upper quadrant pain: Secondary | ICD-10-CM | POA: Insufficient documentation

## 2020-12-17 LAB — AMYLASE: Amylase: 43 U/L (ref 28–100)

## 2020-12-17 LAB — COMPREHENSIVE METABOLIC PANEL
ALT: 21 U/L (ref 0–44)
AST: 21 U/L (ref 15–41)
Albumin: 4.1 g/dL (ref 3.5–5.0)
Alkaline Phosphatase: 74 U/L (ref 38–126)
Anion gap: 9 (ref 5–15)
BUN: 14 mg/dL (ref 6–20)
CO2: 23 mmol/L (ref 22–32)
Calcium: 9.2 mg/dL (ref 8.9–10.3)
Chloride: 105 mmol/L (ref 98–111)
Creatinine, Ser: 0.65 mg/dL (ref 0.44–1.00)
GFR, Estimated: >60 mL/min (ref >60)
Glucose, Bld: 112 mg/dL — ABNORMAL HIGH (ref 70–99)
Potassium: 3.4 mmol/L — ABNORMAL LOW (ref 3.5–5.1)
Sodium: 137 mmol/L (ref 135–145)
Total Bilirubin: 1.1 mg/dL (ref 0.3–1.2)
Total Protein: 7.3 g/dL (ref 6.5–8.1)

## 2020-12-17 LAB — CBC WITH DIFFERENTIAL/PLATELET
Abs Immature Granulocytes: 0.02 10*3/uL (ref 0.00–0.07)
Basophils Absolute: 0.1 10*3/uL (ref 0.0–0.1)
Basophils Relative: 1 %
Eosinophils Absolute: 0.2 10*3/uL (ref 0.0–0.5)
Eosinophils Relative: 2 %
HCT: 50.8 % — ABNORMAL HIGH (ref 36.0–46.0)
Hemoglobin: 17.1 g/dL — ABNORMAL HIGH (ref 12.0–15.0)
Immature Granulocytes: 0 %
Lymphocytes Relative: 30 %
Lymphs Abs: 2.1 10*3/uL (ref 0.7–4.0)
MCH: 30.5 pg (ref 26.0–34.0)
MCHC: 33.7 g/dL (ref 30.0–36.0)
MCV: 90.7 fL (ref 80.0–100.0)
Monocytes Absolute: 0.6 10*3/uL (ref 0.1–1.0)
Monocytes Relative: 8 %
Neutro Abs: 4.1 10*3/uL (ref 1.7–7.7)
Neutrophils Relative %: 59 %
Platelets: 239 10*3/uL (ref 150–400)
RBC: 5.6 MIL/uL — ABNORMAL HIGH (ref 3.87–5.11)
RDW: 15.4 % (ref 11.5–15.5)
WBC: 7 10*3/uL (ref 4.0–10.5)
nRBC: 0 % (ref 0.0–0.2)

## 2020-12-17 LAB — URINE CULTURE

## 2020-12-17 LAB — LIPASE, BLOOD: Lipase: 23 U/L (ref 11–51)

## 2020-12-17 NOTE — Progress Notes (Signed)
Pt having left side pain under left breast. Pt states she is not sure if it radiates due to have fibromyalgia. Took hot epsom salt bath last night and did not help. Pt states she has also been nauseous and burping really loud.     Patient ID: Summer [name], female    DOB: 07-Oct-1961, 60 y.o.   MRN: [id number]   Chief Complaint  Patient presents with  . Left Side Pain   Subjective:  CC: left side pain under breast  This is a new problem.  Presents today for an acute visit with left-sided pain under the breast.  Reports that sharp pains come and go, dull pain is constant.  Also describes as burning.  Symptoms started yesterday morning.  Associated symptoms include nausea and burping.  Has tried Epson salt bath without relief.  Specialist include GYN for postmenopausal bleeding, has a GI specialist appointment February 05, 2021, for rectal bleeding.  Urine rechecked November 14, 2020 for recurring hematuria, negative for blood at that time, culture pending (per GYN).  Denies fever, chills, endorses chest tightness, shortness of breath abdominal pain and nausea.  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    Medical History Summer [name] has a past medical history of Arthritis, Bell's palsy, CHF (congestive heart failure) ([place]), Dysrhythmia, cardiac, Fibromyalgia, Hashimoto's disease, History of CT scan (06/2017), Hypertension, and Thyroid disease.   Outpatient Encounter Medications as of 12/17/2020  Medication Sig  . albuterol (PROVENTIL) (2.5 MG/3ML) 0.083% nebulizer solution Take 3 mLs (2.5 mg total) by nebulization every 4 (four) hours as needed for wheezing.  Marland Kitchen albuterol (VENTOLIN HFA) 108 (90 Base) MCG/ACT inhaler INHALE TWO PUFFS BY MOUTH EVERY 4 HOURS INTO  THE  LUNGS  AS  NEEDED  . ALPRAZolam (XANAX) 0.5 MG tablet TAKE 1 TABLET BY MOUTH THREE TIMES DAILY AS NEEDED FOR SLEEP FOR ANXIETY  . Ascorbic Acid (VITAMIN C) 500 MG CAPS Take 1  capsule by mouth 2 (two) times daily.   Marland Kitchen aspirin EC 81 MG tablet Take 81 mg by mouth daily.  . Cholecalciferol (VITAMIN D-3) 5000 UNITS TABS Take 1 tablet by mouth daily.  . Folic Acid-Vit N4-OEV O35 0.8-50-0.1 MG TABS Take 1 tablet by mouth daily.  . furosemide (LASIX) 20 MG tablet TAKE 2 TABLETS BY MOUTH IN THE MORNING  . Magnesium 200 MG TABS Take 2 tablets by mouth 2 (two) times daily.   . medroxyPROGESTERone (PROVERA) 10 MG tablet Take 1 tablet (10 mg total) by mouth daily. The first 10 days of the month each month  . Multiple Vitamin (MULTIVITAMIN WITH MINERALS) TABS tablet Take 1 tablet by mouth daily.  . Multiple Vitamins-Minerals (ZINC PO) Take by mouth.  . OLIVE LEAF PO Take by mouth.  . OREGANO PO Take by mouth.  . potassium chloride SA (KLOR-CON) 20 MEQ tablet Take 1 tablet (20 mEq total) by mouth daily.  Marland Kitchen thyroid (NP THYROID) 60 MG tablet TAKE 1 TABLET BY MOUTH ONCE DAILY BEFORE BREAKFAST  . UNABLE TO FIND Med Name: Grape Root   No facility-administered encounter medications on file as of 12/17/2020.     Review of Systems  Constitutional: Negative for chills and fever.  Respiratory: Positive for chest tightness and shortness of breath.        Not new.   Cardiovascular: Positive for chest pain, palpitations and leg swelling.  Gastrointestinal: Positive for abdominal pain, blood in stool and nausea. Negative for  vomiting.       Has GI specialist appointment 02/05/21.  Nausea started yesterday.   Genitourinary: Negative for dysuria.  Musculoskeletal: Positive for back pain.       Not new.      Vitals BP (!) 154/93   Pulse (!) 102   Temp 97.7 F (36.5 C)   Wt (!) 300 lb 12.8 oz (136.4 kg)   LMP 01/23/2014 Comment: spotting  SpO2 98%   BMI 50.06 kg/m   Objective:   Physical Exam Vitals reviewed.  Constitutional:      General: She is not in acute distress.    Appearance: Normal appearance.  Cardiovascular:     Rate and Rhythm: Normal rate and regular rhythm.      Heart sounds: Normal heart sounds.  Pulmonary:     Effort: Pulmonary effort is normal.     Breath sounds: Normal breath sounds.  Abdominal:     General: Bowel sounds are normal.     Tenderness: There is abdominal tenderness in the right upper quadrant, right lower quadrant, left upper quadrant and left lower quadrant.  Skin:    General: Skin is warm and dry.  Neurological:     General: No focal deficit present.     Mental Status: She is alert.  Psychiatric:        Behavior: Behavior normal.      Assessment and Plan   1. Chest pain, unspecified type - EKG 12-Lead - CBC with Differential - Comprehensive Metabolic Panel (CMET) - Amylase - Lipase  2. Left upper quadrant abdominal pain - CBC with Differential - Comprehensive Metabolic Panel (CMET) - Amylase - Lipase   Symptoms concerning for abdominal pathology.  EKG normal in the office today, no ectopy, no evidence of ischemia.  Reviewed EKG with Dr. Sallee Lange while patient in the office.  Due to financial issues, unable to get lab work today at the [facility], will send to [facility] for stat labs.  Update: Overall lab work looks good.  Consistently has elevated hemoglobin and hematocrit.  Potassium slightly decreased at 3.4.  Instructed patient to increase potassium rich foods, and to take her potassium supplement as prescribed.  Kidney and liver functions normal, amylase and lipase normal.  Agrees with plan of care discussed today. Understands warning signs to seek further care: chest pain, shortness of breath, any significant change in health.  Understands to follow-up if symptoms do not improve, or worsen.  Encouraged patient to speak to GYN concerning progesterone therapy, has GI specialist appointment February 05, 2021 for previous rectal bleeding.  If abdominal pain worsens, advised to go to the emergency department.   Chalmers Guest, NP 12/17/2020

## 2021-02-05 ENCOUNTER — Ambulatory Visit (INDEPENDENT_AMBULATORY_CARE_PROVIDER_SITE_OTHER): Payer: Self-pay | Admitting: Gastroenterology

## 2021-02-16 ENCOUNTER — Other Ambulatory Visit: Payer: Self-pay | Admitting: Family Medicine

## 2021-02-17 ENCOUNTER — Telehealth: Payer: Self-pay

## 2021-02-17 NOTE — Telephone Encounter (Signed)
Contacted patient. Last thyroid labs were 10/30/20. No recent labs (that I see). Pt states she had labs drawn at [facility] and she asked them to include thyroid and they said they would. Pt is going to call [facility] in the morning to see if she can find out any information.

## 2021-02-17 NOTE — Telephone Encounter (Signed)
Pt just called wanting to know why she has to do a follow up when she just had blood work done a month ago for her thyroid (NP THYROID) 60 MG tablet    Pt call back [phone]

## 2021-02-19 MED ORDER — THYROID 60 MG PO TABS: ORAL_TABLET | ORAL | 3 refills | Status: DC

## 2021-02-19 NOTE — Addendum Note (Signed)
Addended by: Vicente Males on: 02/19/2021 01:47 PM   Modules accepted: Orders

## 2021-02-19 NOTE — Telephone Encounter (Signed)
Please advise. Thank you

## 2021-02-19 NOTE — Telephone Encounter (Signed)
I am fine with TSH if she just had blood drawn otherwise and regardless she may have 3 refills and do a follow-up by June/early July

## 2021-02-19 NOTE — Telephone Encounter (Signed)
Medication sent to pharmacy with note to follow up in June/July

## 2021-03-23 ENCOUNTER — Telehealth: Payer: Self-pay | Admitting: Family Medicine

## 2021-03-23 ENCOUNTER — Other Ambulatory Visit: Payer: Self-pay | Admitting: *Deleted

## 2021-03-23 MED ORDER — THYROID 60 MG PO TABS: ORAL_TABLET | ORAL | 0 refills | Status: DC

## 2021-03-23 NOTE — Telephone Encounter (Signed)
Refill sent to pharm per protocol and pt was notified.

## 2021-03-23 NOTE — Telephone Encounter (Signed)
Patient is requesting refill on thyroid NP 60 mg called in to [facility] enough until her appointment 6/1 for medication followup

## 2021-04-01 ENCOUNTER — Ambulatory Visit (INDEPENDENT_AMBULATORY_CARE_PROVIDER_SITE_OTHER): Payer: 59 | Admitting: Family Medicine

## 2021-04-01 ENCOUNTER — Other Ambulatory Visit: Payer: Self-pay

## 2021-04-01 ENCOUNTER — Encounter: Payer: Self-pay | Admitting: Family Medicine

## 2021-04-01 VITALS — BP 122/92 | HR 90 | Temp 98.4°F | Ht 65.0 in | Wt 298.0 lb

## 2021-04-01 DIAGNOSIS — E039 Hypothyroidism, unspecified: Secondary | ICD-10-CM

## 2021-04-01 DIAGNOSIS — R030 Elevated blood-pressure reading, without diagnosis of hypertension: Secondary | ICD-10-CM | POA: Diagnosis not present

## 2021-04-01 DIAGNOSIS — F32A Depression, unspecified: Secondary | ICD-10-CM

## 2021-04-01 DIAGNOSIS — R7303 Prediabetes: Secondary | ICD-10-CM

## 2021-04-01 DIAGNOSIS — E785 Hyperlipidemia, unspecified: Secondary | ICD-10-CM | POA: Diagnosis not present

## 2021-04-01 MED ORDER — FUROSEMIDE 20 MG PO TABS
ORAL_TABLET | ORAL | 5 refills | Status: DC
Start: 2021-04-01 — End: 2021-12-21

## 2021-04-01 MED ORDER — POTASSIUM CHLORIDE CRYS ER 20 MEQ PO TBCR: 20.0000 meq | EXTENDED_RELEASE_TABLET | Freq: Every day | ORAL | 5 refills | Status: DC

## 2021-04-01 MED ORDER — HYDROCODONE-ACETAMINOPHEN 5-325 MG PO TABS: 1.0000 | ORAL_TABLET | Freq: Four times a day (QID) | ORAL | 0 refills | Status: AC | PRN

## 2021-04-01 MED ORDER — ALPRAZOLAM 0.5 MG PO TABS: ORAL_TABLET | ORAL | 1 refills | Status: DC

## 2021-04-01 MED ORDER — THYROID 60 MG PO TABS: ORAL_TABLET | ORAL | 5 refills | Status: DC

## 2021-04-01 MED ORDER — FLUOXETINE HCL 20 MG PO CAPS: 20.0000 mg | ORAL_CAPSULE | Freq: Every day | ORAL | 2 refills | Status: DC

## 2021-04-01 MED ORDER — FLUOXETINE HCL 20 MG PO CAPS: 20.0000 mg | ORAL_CAPSULE | Freq: Every day | ORAL | 4 refills | Status: DC

## 2021-04-01 MED ORDER — ALBUTEROL SULFATE HFA 108 (90 BASE) MCG/ACT IN AERS: INHALATION_SPRAY | RESPIRATORY_TRACT | 3 refills | Status: DC

## 2021-04-01 NOTE — Progress Notes (Signed)
   Subjective:    Patient ID: Summer [name], female    DOB: 15-May-1961, 60 y.o.   MRN: [id number]  HPIhypothyroidism. Pt concerned about having symptoms of dry brittle hair and itching.   Has not started provera from gyn. Wanted to talk with dr Nicki Reaper about med first. Concerned about family history of strokes and heart attacks.    Would like refill on vicodin. States she has not had in a while. Takes for fibromyalgia pain. States she rarely takes med.   Patient gets a lot of knee pain back pain uses Vicodin sparingly but it does help her when Tylenol does not do enough she understands that its not medication for frequent use   Review of Systems     Objective:   Physical Exam  Moderate obesity patient is trying to lose weight Blood pressure recheck 122/92 Lungs clear heart regular extremities no edema      Assessment & Plan:  1. Elevated blood pressure reading Recheck her again in several weeks at her physical if it is still elevated add additional medicine minimize salt stay active - Comprehensive metabolic panel  2. Hyperlipidemia, unspecified hyperlipidemia type Check lipid profile may or may not need statin has prediabetes - Comprehensive metabolic panel - Lipid panel  3. Prediabetes Minimize starches check A1c follow-up for wellness later this month - Comprehensive metabolic panel - Hemoglobin A1c  4. Hypothyroidism, unspecified type No thyroid enlargement check lab tests including antibodies per patient request - Thyroid Peroxidase Antibodies (TPO) (REFL) - TSH - T4, free - Comprehensive metabolic panel  5. Depression, unspecified depression type Moderate depression recommend counseling she recently went through the loss of a relative we will go ahead and start Prozac she will follow-up in less than 4 weeks for her wellness and see how she is doing at that time patient not suicidal currently warning signs were discussed what to do if she starts feeling suicidal  was discussed - Ambulatory referral to Psychology

## 2021-04-01 NOTE — Patient Instructions (Addendum)

## 2021-04-02 ENCOUNTER — Telehealth: Payer: Self-pay | Admitting: *Deleted

## 2021-04-02 NOTE — Telephone Encounter (Signed)
Left message to return call 

## 2021-04-02 NOTE — Telephone Encounter (Signed)
Pt seen yesterday and let go before I did bw orders. Need to let her know that one test printed out on [facility] and the rest at [facility]. She needs to pick up both sets of orders to take to [facility] or we can redo orders all for [facility]. Also dr scott wanted to give her the number to [facility] scheduling. [phone]. bw orders put up front for pt to pick up since I could not get that one test on [facility] order.

## 2021-04-02 NOTE — Telephone Encounter (Signed)
Discussed with pt. Pt verbalized understanding.  °

## 2021-04-04 LAB — COMPREHENSIVE METABOLIC PANEL
ALT: 20 IU/L (ref 0–32)
AST: 18 IU/L (ref 0–40)
Albumin/Globulin Ratio: 1.6 (ref 1.2–2.2)
Albumin: 4.3 g/dL (ref 3.8–4.9)
Alkaline Phosphatase: 122 IU/L — ABNORMAL HIGH (ref 44–121)
BUN/Creatinine Ratio: 23 (ref 9–23)
BUN: 16 mg/dL (ref 6–24)
Bilirubin Total: 0.5 mg/dL (ref 0.0–1.2)
CO2: 23 mmol/L (ref 20–29)
Calcium: 9.7 mg/dL (ref 8.7–10.2)
Chloride: 102 mmol/L (ref 96–106)
Creatinine, Ser: 0.69 mg/dL (ref 0.57–1.00)
Globulin, Total: 2.7 g/dL (ref 1.5–4.5)
Glucose: 105 mg/dL — ABNORMAL HIGH (ref 65–99)
Potassium: 4.5 mmol/L (ref 3.5–5.2)
Sodium: 142 mmol/L (ref 134–144)
Total Protein: 7 g/dL (ref 6.0–8.5)
eGFR: 101 mL/min/{1.73_m2} (ref >59)

## 2021-04-04 LAB — THYROID PEROXIDASE ANTIBODY: Thyroperoxidase Ab SerPl-aCnc: >600 IU/mL — ABNORMAL HIGH (ref 0–34)

## 2021-04-04 LAB — LIPID PANEL
Chol/HDL Ratio: 3.3 ratio (ref 0.0–4.4)
Cholesterol, Total: 173 mg/dL (ref 100–199)
HDL: 52 mg/dL (ref >39)
LDL Chol Calc (NIH): 98 mg/dL (ref 0–99)
Triglycerides: 127 mg/dL (ref 0–149)
VLDL Cholesterol Cal: 23 mg/dL (ref 5–40)

## 2021-04-04 LAB — HEMOGLOBIN A1C
Est. average glucose Bld gHb Est-mCnc: 131 mg/dL
Hgb A1c MFr Bld: 6.2 % — ABNORMAL HIGH (ref 4.8–5.6)

## 2021-04-04 LAB — TSH: TSH: 2.9 u[IU]/mL (ref 0.450–4.500)

## 2021-04-04 LAB — T4, FREE: Free T4: 1.3 ng/dL (ref 0.82–1.77)

## 2021-04-08 ENCOUNTER — Encounter (HOSPITAL_COMMUNITY): Payer: Self-pay

## 2021-04-08 ENCOUNTER — Emergency Department (HOSPITAL_COMMUNITY)
Admission: EM | Admit: 2021-04-08 | Discharge: 2021-04-08 | Disposition: A | Discharge status: Home / Self Care | Payer: 59 | Attending: Emergency Medicine | Admitting: Emergency Medicine

## 2021-04-08 ENCOUNTER — Emergency Department (HOSPITAL_COMMUNITY): Payer: 59

## 2021-04-08 ENCOUNTER — Other Ambulatory Visit: Payer: Self-pay

## 2021-04-08 DIAGNOSIS — R0789 Other chest pain: Secondary | ICD-10-CM | POA: Diagnosis not present

## 2021-04-08 DIAGNOSIS — Z7982 Long term (current) use of aspirin: Secondary | ICD-10-CM | POA: Insufficient documentation

## 2021-04-08 DIAGNOSIS — I509 Heart failure, unspecified: Secondary | ICD-10-CM | POA: Insufficient documentation

## 2021-04-08 DIAGNOSIS — R600 Localized edema: Secondary | ICD-10-CM | POA: Diagnosis not present

## 2021-04-08 DIAGNOSIS — Z87891 Personal history of nicotine dependence: Secondary | ICD-10-CM | POA: Diagnosis not present

## 2021-04-08 DIAGNOSIS — R0602 Shortness of breath: Secondary | ICD-10-CM | POA: Insufficient documentation

## 2021-04-08 DIAGNOSIS — I110 Hypertensive heart disease with heart failure: Secondary | ICD-10-CM | POA: Insufficient documentation

## 2021-04-08 DIAGNOSIS — R079 Chest pain, unspecified: Secondary | ICD-10-CM | POA: Diagnosis present

## 2021-04-08 LAB — BASIC METABOLIC PANEL
Anion gap: 7 (ref 5–15)
BUN: 18 mg/dL (ref 6–20)
CO2: 26 mmol/L (ref 22–32)
Calcium: 9.2 mg/dL (ref 8.9–10.3)
Chloride: 108 mmol/L (ref 98–111)
Creatinine, Ser: 0.66 mg/dL (ref 0.44–1.00)
GFR, Estimated: >60 mL/min (ref >60)
Glucose, Bld: 103 mg/dL — ABNORMAL HIGH (ref 70–99)
Potassium: 3.5 mmol/L (ref 3.5–5.1)
Sodium: 141 mmol/L (ref 135–145)

## 2021-04-08 LAB — TROPONIN I (HIGH SENSITIVITY)
Troponin I (High Sensitivity): 22 ng/L — ABNORMAL HIGH (ref <18)
Troponin I (High Sensitivity): 24 ng/L — ABNORMAL HIGH (ref <18)

## 2021-04-08 LAB — HEPATIC FUNCTION PANEL
ALT: 19 U/L (ref 0–44)
AST: 20 U/L (ref 15–41)
Albumin: 4 g/dL (ref 3.5–5.0)
Alkaline Phosphatase: 88 U/L (ref 38–126)
Bilirubin, Direct: 0.1 mg/dL (ref 0.0–0.2)
Indirect Bilirubin: 0.6 mg/dL (ref 0.3–0.9)
Total Bilirubin: 0.7 mg/dL (ref 0.3–1.2)
Total Protein: 7.2 g/dL (ref 6.5–8.1)

## 2021-04-08 LAB — CBC
HCT: 51 % — ABNORMAL HIGH (ref 36.0–46.0)
Hemoglobin: 16.7 g/dL — ABNORMAL HIGH (ref 12.0–15.0)
MCH: 29.9 pg (ref 26.0–34.0)
MCHC: 32.7 g/dL (ref 30.0–36.0)
MCV: 91.4 fL (ref 80.0–100.0)
Platelets: 239 10*3/uL (ref 150–400)
RBC: 5.58 MIL/uL — ABNORMAL HIGH (ref 3.87–5.11)
RDW: 14.9 % (ref 11.5–15.5)
WBC: 8.5 10*3/uL (ref 4.0–10.5)
nRBC: 0 % (ref 0.0–0.2)

## 2021-04-08 LAB — BRAIN NATRIURETIC PEPTIDE: B Natriuretic Peptide: 36 pg/mL (ref 0.0–100.0)

## 2021-04-08 MED ORDER — DOXYCYCLINE HYCLATE 100 MG PO CAPS: 100.0000 mg | ORAL_CAPSULE | Freq: Two times a day (BID) | ORAL | 0 refills | Status: DC

## 2021-04-08 NOTE — Discharge Instructions (Signed)
Start taking your Lasix once a day and follow-up with your doctor next week

## 2021-04-08 NOTE — ED Provider Notes (Signed)
Emergency Medicine Provider Triage Evaluation Note  Summer [name] , a 60 y.o. female  was evaluated in triage.  Pt complains of shortness of breath, chest pain and a tick bite   Review of Systems  Positive: cough Negative: No fever  Physical Exam  BP (!) 143/99 (BP Location: Right Arm)   Pulse 92   Temp 98.5 F (36.9 C) (Oral)   Resp 18   Ht 5\' 5"  (1.651 m)   Wt 135.2 kg   LMP 01/23/2014 Comment: spotting  SpO2 97%   BMI 49.59 kg/m  Gen:   Awake, no distress   Resp:  Normal effort  MSK:   Moves extremities without difficulty  Other:    Medical Decision Making  Medically screening exam initiated at 6:03 PM.  Appropriate orders placed.  LASANDRA BATLEY was informed that the remainder of the evaluation will be completed by another provider, this initial triage assessment does not replace that evaluation, and the importance of remaining in the ED until their evaluation is complete.     Fransico Meadow, [place] 04/08/21 Mirian Capuchin    Milton Ferguson, MD 04/09/21 1039

## 2021-04-08 NOTE — ED Triage Notes (Signed)
Pt to er, pt states that she is here for chest pain and shortness of breath, states that she first noticed it when she was talking three days ago, states that the pain seems to come and go, states that if she gets angry or upset it gets worse.  States that she also has a headache.

## 2021-04-08 NOTE — ED Provider Notes (Signed)
[facility] EMERGENCY DEPARTMENT Provider Note   CSN: [id number] Arrival date & time: 04/08/21  1643     History Chief Complaint  Patient presents with   Chest Pain    Summer [name] is a 59 y.o. female.  Patient complains of some swelling in her legs and a recent tick bite and some shortness of breath.  Patient has a history of heart failure  The history is provided by the patient and medical records. No language interpreter was used.  Chest Pain Pain location:  L chest Pain quality: aching   Pain radiates to:  Does not radiate Pain severity:  Mild Onset quality:  Sudden Duration:  10 minutes Timing:  Intermittent Progression:  Waxing and waning Chronicity:  New Relieved by:  Nothing Worsened by:  Nothing Associated symptoms: no abdominal pain, no back pain, no cough, no fatigue and no headache       Past Medical History:  Diagnosis Date   Arthritis    Bell's palsy    CHF (congestive heart failure) (HCC)    Dysrhythmia, cardiac    Fibromyalgia    Hashimoto's disease    History of CT scan 06/2017   "coronary CT scan on 06/01/2017, this did not find any cardiac calcifications. Coronary calcium score of zero."   Hypertension    Thyroid disease     Patient Active Problem List   Diagnosis Date Noted   Chest pain 12/17/2020   Left upper quadrant abdominal pain 12/17/2020   Hematuria 11/05/2020   Cough 11/05/2020   Group B streptococcal UTI 11/02/2020   Acute cystitis with hematuria 10/28/2020   Ketonuria 10/28/2020   Rectal bleeding 10/28/2020   Postmenopausal bleeding 08/10/2018   Palpitations 10/09/2016   Prediabetes 06/10/2014   Subclinical hypothyroidism 06/10/2014   Abdominal pain, other specified site 12/26/2013   Esophageal reflux 04/01/2013   Fibromyalgia 03/22/2013   Hypokalemia 03/22/2013   Facial paralysis/Bells palsy 02/01/2013    Past Surgical History:  Procedure Laterality Date   COLONOSCOPY N/A 01/23/2014   Procedure: COLONOSCOPY;   Surgeon: Rogene Houston, MD;  Location: [facility];  Service: Endoscopy;  Laterality: N/A;  200   TONSILLECTOMY       OB History     Gravida  3   Para  1   Term  1   Preterm      AB  2   Living  1      SAB  1   IAB      Ectopic      Multiple      Live Births  1           Family History  Adopted: Yes  Problem Relation Age of Onset   Cancer Mother        lung   Other Daughter        Lyme's disease    Social History   Tobacco Use   Smoking status: Former Smoker    Packs/day: 1.00    Years: 20.00    Pack years: 20.00    Types: Cigarettes    Quit date: 11/08/2003    Years since quitting: 17.4   Smokeless tobacco: Never Used   Tobacco comment: quit smoking 10 yrs   Vaping Use   Vaping Use: Never used  Substance Use Topics   Alcohol use: No   Drug use: No    Home Medications Prior to Admission medications   Medication Sig Start Date End Date Taking? Authorizing Provider  Acetylcysteine (NAC PO) Take 1 tablet by mouth daily.   Yes [provider]  albuterol (PROVENTIL) (2.5 MG/3ML) 0.083% nebulizer solution Take 3 mLs (2.5 mg total) by nebulization every 4 (four) hours as needed for wheezing. 09/26/18  Yes Juanito Doom, MD  albuterol (VENTOLIN HFA) 108 (90 Base) MCG/ACT inhaler INHALE TWO PUFFS BY MOUTH EVERY 4 HOURS INTO  THE  LUNGS  AS  NEEDED 04/01/21  Yes Luking, Elayne Snare, MD  ALPRAZolam (XANAX) 0.5 MG tablet TAKE 1 TABLET BY MOUTH THREE TIMES DAILY AS NEEDED FOR SLEEP FOR ANXIETY 04/01/21  Yes Luking, Elayne Snare, MD  Ascorbic Acid (VITAMIN C) 500 MG CAPS Take 1 capsule by mouth 2 (two) times daily.    Yes [provider]  aspirin EC 81 MG tablet Take 81 mg by mouth daily.   Yes [provider]  Cholecalciferol (VITAMIN D-3) 5000 UNITS TABS Take 1 tablet by mouth daily.   Yes [provider]  doxycycline (VIBRAMYCIN) 100 MG capsule Take 1 capsule (100 mg total) by mouth 2 (two) times daily. One po bid x 7 days  04/08/21  Yes Milton Ferguson, MD  Folic Acid-Vit Z6-XWR U04 0.8-50-0.1 MG TABS Take 1 tablet by mouth daily.   Yes [provider]  furosemide (LASIX) 20 MG tablet TAKE 2 TABLETS BY MOUTH IN THE MORNING AS NEEDED 04/01/21  Yes Luking, Elayne Snare, MD  Magnesium 200 MG TABS Take 1 tablet by mouth daily.   Yes [provider]  Multiple Vitamin (MULTIVITAMIN WITH MINERALS) TABS tablet Take 1 tablet by mouth daily.   Yes [provider]  Multiple Vitamins-Minerals (ZINC PO) Take 1 tablet by mouth daily.   Yes [provider]  OLIVE LEAF PO Take 1 tablet by mouth daily.   Yes [provider]  potassium chloride SA (KLOR-CON) 20 MEQ tablet Take 1 tablet (20 mEq total) by mouth daily. 04/01/21  Yes Kathyrn Drown, MD  thyroid (NP THYROID) 60 MG tablet TAKE 1 TABLET BY MOUTH ONCE DAILY BEFORE BREAKFAST 04/01/21  Yes Luking, Scott A, MD  FLUoxetine (PROZAC) 20 MG capsule Take 1 capsule (20 mg total) by mouth daily. 04/01/21   Kathyrn Drown, MD  FLUoxetine (PROZAC) 20 MG capsule Take 1 capsule (20 mg total) by mouth daily. Patient not taking: No sig reported 04/01/21   Kathyrn Drown, MD  HYDROcodone-acetaminophen (NORCO/VICODIN) 5-325 MG tablet Take 1 tablet by mouth every 6 (six) hours as needed. 04/07/21   [provider]  medroxyPROGESTERone (PROVERA) 10 MG tablet Take 1 tablet (10 mg total) by mouth daily. The first 10 days of the month each month Patient not taking: No sig reported 12/15/20   Florian Buff, MD  UNABLE TO FIND Med Name: Grape Root Patient not taking: No sig reported    [provider]    Allergies    Avelox [moxifloxacin hcl in nacl], Bee venom, and Shellfish allergy  Review of Systems   Review of Systems  Constitutional:  Negative for appetite change and fatigue.  HENT:  Negative for congestion, ear discharge and sinus pressure.   Eyes:  Negative for discharge.  Respiratory:  Negative for cough.   Cardiovascular:  Positive for  chest pain.  Gastrointestinal:  Negative for abdominal pain and diarrhea.  Genitourinary:  Negative for frequency and hematuria.  Musculoskeletal:  Negative for back pain.       Edema in legs  Skin:  Negative for rash.  Neurological:  Negative for  seizures and headaches.  Psychiatric/Behavioral:  Negative for hallucinations.    Physical Exam Updated Vital Signs BP (!) 159/78 (BP Location: Right Arm)   Pulse 78   Temp 98.5 F (36.9 C) (Oral)   Resp 17   Ht 5\' 5"  (1.651 m)   Wt 135.2 kg   LMP 01/23/2014 Comment: spotting  SpO2 99%   BMI 49.59 kg/m   Physical Exam Vitals and nursing note reviewed.  Constitutional:      Appearance: She is well-developed.  HENT:     Head: Normocephalic.     Nose: Nose normal.  Eyes:     General: No scleral icterus.    Conjunctiva/sclera: Conjunctivae normal.  Neck:     Thyroid: No thyromegaly.  Cardiovascular:     Rate and Rhythm: Normal rate and regular rhythm.     Heart sounds: No murmur heard.   No friction rub. No gallop.  Pulmonary:     Breath sounds: No stridor. No wheezing or rales.  Chest:     Chest wall: No tenderness.  Abdominal:     General: There is no distension.     Tenderness: There is no abdominal tenderness. There is no rebound.  Musculoskeletal:        General: Normal range of motion.     Cervical back: Neck supple.  Lymphadenopathy:     Cervical: No cervical adenopathy.  Skin:    Findings: No erythema or rash.  Neurological:     Mental Status: She is alert and oriented to person, place, and time.     Motor: No abnormal muscle tone.     Coordination: Coordination normal.  Psychiatric:        Behavior: Behavior normal.    ED Results / Procedures / Treatments   Labs (all labs ordered are listed, but only abnormal results are displayed) Labs Reviewed  BASIC METABOLIC PANEL - Abnormal; Notable for the following components:      Result Value   Glucose, Bld 103 (*)    All other components within normal limits   CBC - Abnormal; Notable for the following components:   RBC 5.58 (*)    Hemoglobin 16.7 (*)    HCT 51.0 (*)    All other components within normal limits  TROPONIN I (HIGH SENSITIVITY) - Abnormal; Notable for the following components:   Troponin I (High Sensitivity) 22 (*)    All other components within normal limits  TROPONIN I (HIGH SENSITIVITY) - Abnormal; Notable for the following components:   Troponin I (High Sensitivity) 24 (*)    All other components within normal limits  HEPATIC FUNCTION PANEL  BRAIN NATRIURETIC PEPTIDE    EKG EKG Interpretation  Date/Time:  Wednesday April 08 2021 17:00:27 EDT Ventricular Rate:  96 PR Interval:  142 QRS Duration: 72 QT Interval:  338 QTC Calculation: 427 R Axis:   -19 Text Interpretation: Normal sinus rhythm Anterior infarct , age undetermined Abnormal ECG Confirmed by Milton Ferguson [id number]) on 04/08/2021 7:48:31 PM   Radiology DG Chest 2 View  Result Date: 04/08/2021 CLINICAL DATA:  Chest pain, shortness of breath EXAM: CHEST - 2 VIEW COMPARISON:  11/15/2017 FINDINGS: Heart and mediastinal contours are within normal limits. No focal opacities or effusions. No acute bony abnormality. IMPRESSION: No active cardiopulmonary disease. Electronically Signed   By: Rolm Baptise M.D.   On: 04/08/2021 17:28    Procedures Procedures   Medications Ordered in ED Medications - No data to display  ED Course  I have reviewed  the triage vital signs and the nursing notes.  Pertinent labs & imaging results that were available during my care of the patient were reviewed by me and considered in my medical decision making (see chart for details).    MDM Rules/Calculators/A&P                          Patient with minimally elevated troponins.  Doubt coronary artery disease.  Patient does have a history of a normal calcium score few years ago on chest CT.  Patient with some peripheral edema she will start back up on her Lasix and is given Doxy for a  tick bite she had few days ago Final Clinical Impression(s) / ED Diagnoses Final diagnoses:  Atypical chest pain    Rx / DC Orders ED Discharge Orders          Ordered    doxycycline (VIBRAMYCIN) 100 MG capsule  2 times daily        04/08/21 2246             Milton Ferguson, MD 04/09/21 1103

## 2021-04-14 ENCOUNTER — Telehealth: Payer: Self-pay

## 2021-04-15 ENCOUNTER — Encounter: Payer: Self-pay | Admitting: Family Medicine

## 2021-04-15 ENCOUNTER — Other Ambulatory Visit: Payer: Self-pay

## 2021-04-15 ENCOUNTER — Other Ambulatory Visit: Payer: Self-pay | Admitting: *Deleted

## 2021-04-15 ENCOUNTER — Ambulatory Visit (INDEPENDENT_AMBULATORY_CARE_PROVIDER_SITE_OTHER): Payer: 59 | Admitting: Family Medicine

## 2021-04-15 VITALS — BP 172/104 | HR 92 | Temp 96.9°F | Wt 301.4 lb

## 2021-04-15 DIAGNOSIS — R0600 Dyspnea, unspecified: Secondary | ICD-10-CM | POA: Diagnosis not present

## 2021-04-15 DIAGNOSIS — R079 Chest pain, unspecified: Secondary | ICD-10-CM | POA: Diagnosis not present

## 2021-04-15 DIAGNOSIS — R0609 Other forms of dyspnea: Secondary | ICD-10-CM

## 2021-04-15 DIAGNOSIS — E039 Hypothyroidism, unspecified: Secondary | ICD-10-CM

## 2021-04-15 DIAGNOSIS — R600 Localized edema: Secondary | ICD-10-CM

## 2021-04-15 DIAGNOSIS — Z780 Asymptomatic menopausal state: Secondary | ICD-10-CM

## 2021-04-15 MED ORDER — DOXYCYCLINE HYCLATE 100 MG PO CAPS: 100.0000 mg | ORAL_CAPSULE | Freq: Two times a day (BID) | ORAL | 0 refills | Status: DC

## 2021-04-15 NOTE — Progress Notes (Signed)
echo

## 2021-04-15 NOTE — Telephone Encounter (Signed)
Error

## 2021-04-15 NOTE — Progress Notes (Signed)
   Subjective:    Patient ID: Summer [name], female    DOB: Apr 23, 1961, 60 y.o.   MRN: [id number]  HPI Pt here for follow up. Pt went to ER on 04/08/21 for chest pain. Pt still having chest pain that comes and goes, bad headache, swollen lymph nodes under arm and throat, spine pain and still short of breath. Pt had recent tick bite on left arm. Pt was placed on Doxycycline for 7 days.   Pt states her legs will begin to twitch. Legs and feet swelling, taking Lasix daily; not helping a lot. Pt states when she is talking, "its like in mid sentence somebody shut the air off".  Patient is able to talk well without shortness of breath.  She states that she notices this more when she sometimes has it happen out of the blue other times with activity denies any chest pain currently denies hemoptysis. Review of Systems     Objective:   Physical Exam General-in no acute distress Eyes-no discharge Lungs-respiratory rate normal, CTA CV-no murmurs,RRR Extremities skin warm dry some lower leg edema not severe Neuro grossly normal Behavior normal, alert        Assessment & Plan:  1. Chest pain, unspecified type Patient with chest pain.  Troponin slightly elevated in the ER but this could have been related to other issues did not go up.  EKG looks good.  Patient had negative calcium score just a few years ago unlikely to have coronary artery disease of significant nature currently we will get up-to-date echo if there are any wall motion abnormalities may need referral to cardiology  2. Pedal edema Patient will stick with diuretics.  Watch diet stay physically active no tenderness in the leg I doubt DVT I will not order it D-dimer I do not feel that is indicated plus also every single time she has had a D-dimer in the past few years its been elevated  3. DOE (dyspnea on exertion) She does get out of breath with activity could be related to heart related issues also potentially related to  comorbidities of morbid obesity and deconditioning.  We will do the echo as stated above  Will also look into doing a VQ scan to rule out pulmonary embolus although I think the likelihood of this is low  We did discuss warning signs of a tick bite she is on doxycycline follow-up if any problems

## 2021-04-16 NOTE — Telephone Encounter (Signed)
error 

## 2021-04-28 ENCOUNTER — Other Ambulatory Visit: Payer: Self-pay

## 2021-04-28 ENCOUNTER — Ambulatory Visit (INDEPENDENT_AMBULATORY_CARE_PROVIDER_SITE_OTHER): Payer: 59 | Admitting: Family Medicine

## 2021-04-28 VITALS — BP 143/84 | HR 98 | Temp 97.1°F | Ht 65.0 in | Wt 299.4 lb

## 2021-04-28 DIAGNOSIS — R0609 Other forms of dyspnea: Secondary | ICD-10-CM

## 2021-04-28 DIAGNOSIS — Z0001 Encounter for general adult medical examination with abnormal findings: Secondary | ICD-10-CM | POA: Diagnosis not present

## 2021-04-28 DIAGNOSIS — R0600 Dyspnea, unspecified: Secondary | ICD-10-CM | POA: Diagnosis not present

## 2021-04-28 DIAGNOSIS — R000 Tachycardia, unspecified: Secondary | ICD-10-CM

## 2021-04-28 DIAGNOSIS — Z0000 Encounter for general adult medical examination without abnormal findings: Secondary | ICD-10-CM
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
## 2021-05-01 ENCOUNTER — Telehealth: Payer: Self-pay | Admitting: Family Medicine

## 2021-05-07 ENCOUNTER — Other Ambulatory Visit: Payer: Self-pay | Admitting: Family Medicine

## 2021-05-07 ENCOUNTER — Telehealth: Payer: Self-pay | Admitting: Family Medicine

## 2021-05-07 DIAGNOSIS — R0609 Other forms of dyspnea: Secondary | ICD-10-CM

## 2021-05-07 DIAGNOSIS — R0600 Dyspnea, unspecified: Secondary | ICD-10-CM

## 2021-05-07 DIAGNOSIS — R000 Tachycardia, unspecified: Secondary | ICD-10-CM

## 2021-05-07 NOTE — Telephone Encounter (Signed)
-----   Message ----- From: Kathyrn Drown, MD Sent: 05/06/2021   2:07 PM EDT To: Vicente Males, LPN   Please go ahead and replace the order Do not make stat Please notify patient that it is important for her to answer the phone call from [facility] when scheduling ----- Message ----- From: Durene Fruits Sent: 05/05/2021   3:37 PM EDT To: Kathyrn Drown, MD   Good Afternoon - Can we place another order for this CT Angio that is not STAT- I have LM for Patient and she has not returned my call in regards to scheduling. Thanks for your help :)    Order replaced as Normal and pt is aware to answer phone

## 2021-05-08 ENCOUNTER — Telehealth: Payer: Self-pay | Admitting: Family Medicine

## 2021-05-12 ENCOUNTER — Ambulatory Visit (HOSPITAL_COMMUNITY)
Admission: RE | Admit: 2021-05-12 | Discharge: 2021-05-12 | Disposition: A | Discharge status: Home / Self Care | Payer: 59 | Source: Ambulatory Visit | Attending: Family Medicine | Admitting: Family Medicine

## 2021-05-12 ENCOUNTER — Other Ambulatory Visit: Payer: Self-pay

## 2021-05-12 ENCOUNTER — Encounter (HOSPITAL_COMMUNITY): Payer: Self-pay

## 2021-05-12 DIAGNOSIS — R0600 Dyspnea, unspecified: Secondary | ICD-10-CM | POA: Insufficient documentation

## 2021-05-12 DIAGNOSIS — R000 Tachycardia, unspecified: Secondary | ICD-10-CM | POA: Diagnosis present

## 2021-05-12 LAB — POCT I-STAT CREATININE: Creatinine, Ser: 0.8 mg/dL (ref 0.44–1.00)

## 2021-05-12 MED ORDER — IOHEXOL 350 MG/ML SOLN
100.0000 mL | Freq: Once | INTRAVENOUS | Status: AC | PRN
  Administered 2021-05-12: 100 mL via INTRAVENOUS

## 2021-05-20 ENCOUNTER — Ambulatory Visit (HOSPITAL_COMMUNITY)
Admission: RE | Admit: 2021-05-20 | Discharge: 2021-05-20 | Disposition: A | Discharge status: Home / Self Care | Payer: 59 | Source: Ambulatory Visit | Attending: Family Medicine | Admitting: Family Medicine

## 2021-05-20 ENCOUNTER — Other Ambulatory Visit: Payer: Self-pay

## 2021-05-20 DIAGNOSIS — R0600 Dyspnea, unspecified: Secondary | ICD-10-CM | POA: Diagnosis present

## 2021-05-20 DIAGNOSIS — R079 Chest pain, unspecified: Secondary | ICD-10-CM

## 2021-05-20 DIAGNOSIS — R0609 Other forms of dyspnea: Secondary | ICD-10-CM | POA: Diagnosis not present

## 2021-05-20 LAB — ECHOCARDIOGRAM COMPLETE
Area-P 1/2: 2.81 cm2
S' Lateral: 2.4 cm

## 2021-05-20 NOTE — Progress Notes (Signed)
*  PRELIMINARY RESULTS* Echocardiogram 2D Echocardiogram has been performed.  Summer [name] 05/20/2021, 10:12 AM

## 2021-05-25 ENCOUNTER — Other Ambulatory Visit: Payer: Self-pay | Admitting: *Deleted

## 2021-05-25 DIAGNOSIS — E039 Hypothyroidism, unspecified: Secondary | ICD-10-CM

## 2021-05-25 NOTE — Addendum Note (Signed)
Addended by: Dairl Ponder on: 05/25/2021 03:31 PM   Modules accepted: Orders

## 2021-05-25 NOTE — Progress Notes (Signed)
Patient advised per Dr Nicki Reaper: as for the progesterone she would need to be under the care of gynecology for this recommend [facility] they would have to be in direct care to manage this   As for Hashimoto's the TSH T4 look good.  Endocrinology would do a follow-up in 6 months if any abnormality with the thyroid.  Recommend TSH, free T4, TPOantibodies in 4 months if abnormal will refer to endocrinology   Patient verbalized understanding. Referral ordered in Epic. Blood work ordered in [employer]

## 2021-05-28 ENCOUNTER — Ambulatory Visit (INDEPENDENT_AMBULATORY_CARE_PROVIDER_SITE_OTHER): Payer: 59 | Admitting: Gastroenterology

## 2021-05-28 ENCOUNTER — Encounter (INDEPENDENT_AMBULATORY_CARE_PROVIDER_SITE_OTHER): Payer: Self-pay | Admitting: Gastroenterology

## 2021-05-28 ENCOUNTER — Other Ambulatory Visit: Payer: Self-pay

## 2021-05-28 VITALS — BP 159/109 | HR 94 | Temp 98.3°F | Ht 65.0 in | Wt 298.6 lb

## 2021-05-28 DIAGNOSIS — R1030 Lower abdominal pain, unspecified: Secondary | ICD-10-CM | POA: Diagnosis not present

## 2021-05-28 DIAGNOSIS — K219 Gastro-esophageal reflux disease without esophagitis: Secondary | ICD-10-CM

## 2021-05-28 DIAGNOSIS — K625 Hemorrhage of anus and rectum: Secondary | ICD-10-CM | POA: Diagnosis not present

## 2021-05-28 MED ORDER — OMEPRAZOLE 40 MG PO CPDR: 40.0000 mg | DELAYED_RELEASE_CAPSULE | Freq: Every day | ORAL | 3 refills | Status: DC

## 2021-05-28 MED ORDER — DICYCLOMINE HCL 10 MG PO CAPS: 10.0000 mg | ORAL_CAPSULE | Freq: Two times a day (BID) | ORAL | 1 refills | Status: DC | PRN

## 2021-05-28 NOTE — Progress Notes (Signed)
Summer [name], M.D. Gastroenterology & Hepatology [facility] [address] [place], [place] [address] Primary Care Physician: Kathyrn Drown, MD [place] [address]  Referring MD: PCP  Chief Complaint: Rectal bleeding  History of Present Illness: Summer [name] is a 60 y.o. female with Pmh heart failure with decreased ejection fraction, fibromyalgia, Bell's palsy, hypothyroidism, hypertension, who presents for evaluation of rectal bleeding.  Patient reports that in December 2021 she had rectal bleeding with every single bowel movement. States that it lasted 2 weeks. Reports she had lower abdominal pain at that time. Never had previous bleeding and it has not recurred since then.  However, she states the pain has been present on and off  and has has varyied in severity.  She describes the pain as a pressure that caused significant discomfort.  Has tried keto diet for the last month- has decreased weight, from 315 lb to 298 lb. She had similar episode in 2015. She is taking Vicodin occasionally for pain, but mostly takes ibuprofen daily for multiple years for fibromyalgia.  States that while taking Tylenol she does not feel any improvement.  She has 1-2 Bms per day. She feels very occasional nausea.  Notably, the patient came to our office in 2015 for similar abdominal pain which improved on its own for multiple years but came back in the most recent December.  States that she has had episodes of heartburn for at least 2 years. She was taking omeprazole , but stopped after a month as she followed the label indication. She states that the omeprazole over-the-counter 20 mg every day helped to decrease the heartburn episodes but was still feeling regurgitation frequently.  The patient denies having any vomiting, fever, chills, melena, hematemesis, diarrhea, jaundice, pruritus.  Most recent blood work-up includes a  CBC with a hemoglobin of 16.7, low cell count of 8.5 and platelets of 239, CMP was within normal limits except for mild elevation of alkaline phosphatase 122, BUN was 16.  The patient had a CT of the abdomen and pelvis with IV contrast on 10/31/2020 which was within normal limits without explanation for abdominal pain, I reviewed these images personally.  There was only presence of a right kidney cyst but no other acute abnormalities.  Last [id number] Last Colonoscopy:2015  Prep excellent. Small polyp ablated via cold biopsy from ascending colon. Another small polyp ablated via cold biopsy from descending colon. Mucosa rest of the colon and rectum was normal. Small hemorrhoids below the dentate line. Pathology, both polyps were tubular adenomas.  Recommend to have repeat in 5 years.  FHx: neg for any gastrointestinal/liver disease,mother lung cancer Social: quit  smoking 15 years ago, neg alcohol or illicit drug use Surgical: no abdominal surgeries  Past Medical History: Past Medical History:  Diagnosis Date   Arthritis    Bell's palsy    CHF (congestive heart failure) (HCC)    Dysrhythmia, cardiac    Fibromyalgia    Hashimoto's disease    History of CT scan 06/2017   "coronary CT scan on 06/01/2017, this did not find any cardiac calcifications. Coronary calcium score of zero."   Hypertension    Thyroid disease     Past Surgical History: Past Surgical History:  Procedure Laterality Date   COLONOSCOPY N/A 01/23/2014   Procedure: COLONOSCOPY;  Surgeon: Rogene Houston, MD;  [facility];  Service: Endoscopy;  Laterality: N/A;  200   TONSILLECTOMY  Family History: Family History  Adopted: Yes  Problem Relation Age of Onset   Cancer Mother        lung   Other Daughter        Lyme's disease    Social History: Social History   Tobacco Use  Smoking Status Former   Packs/day: 1.00   Years: 20.00   Pack years: 20.00   Types: Cigarettes   Quit date:  11/08/2003   Years since quitting: 17.5  Smokeless Tobacco Never  Tobacco Comments   quit smoking 10 yrs    Social History   Substance and Sexual Activity  Alcohol Use No   Social History   Substance and Sexual Activity  Drug Use No    Allergies: Allergies  Allergen Reactions   Avelox [Moxifloxacin Hcl In Nacl] Other (See Comments)    "heart beats weird"   Bee Venom Hives   Shellfish Allergy Hives    Medications: Current Outpatient Medications  Medication Sig Dispense Refill   albuterol (PROVENTIL) (2.5 MG/3ML) 0.083% nebulizer solution Take 3 mLs (2.5 mg total) by nebulization every 4 (four) hours as needed for wheezing. 75 mL 11   albuterol (VENTOLIN HFA) 108 (90 Base) MCG/ACT inhaler INHALE TWO PUFFS BY MOUTH EVERY 4 HOURS INTO  THE  LUNGS  AS  NEEDED 18 g 3   ALPRAZolam (XANAX) 0.5 MG tablet TAKE 1 TABLET BY MOUTH THREE TIMES DAILY AS NEEDED FOR SLEEP FOR ANXIETY 30 tablet 1   Ascorbic Acid (VITAMIN C) 500 MG CAPS Take 1 capsule by mouth 2 (two) times daily.      aspirin EC 81 MG tablet Take 81 mg by mouth daily.     Cholecalciferol (VITAMIN D-3) 5000 UNITS TABS Take 1 tablet by mouth daily.     FLUoxetine (PROZAC) 20 MG capsule Take 1 capsule (20 mg total) by mouth daily. 30 capsule 4   Folic Acid-Vit Q000111Q 123456 0.8-50-0.1 MG TABS Take 1 tablet by mouth daily.     HYDROcodone-acetaminophen (NORCO/VICODIN) 5-325 MG tablet Take 1 tablet by mouth every 6 (six) hours as needed.     Magnesium 200 MG TABS Take 1 tablet by mouth daily.     Multiple Vitamin (MULTIVITAMIN WITH MINERALS) TABS tablet Take 1 tablet by mouth daily.     Multiple Vitamins-Minerals (ZINC PO) Take 1 tablet by mouth daily.     OLIVE LEAF PO Take 1 tablet by mouth daily.     potassium chloride SA (KLOR-CON) 20 MEQ tablet Take 1 tablet (20 mEq total) by mouth daily. 30 tablet 5   thyroid (NP THYROID) 60 MG tablet TAKE 1 TABLET BY MOUTH ONCE DAILY BEFORE BREAKFAST 30 tablet 5   furosemide (LASIX) 20 MG  tablet TAKE 2 TABLETS BY MOUTH IN THE MORNING AS NEEDED (Patient taking differently: Take 20 mg by mouth. 1-2 daily) 60 tablet 5   No current facility-administered medications for this visit.    Review of Systems: GENERAL: negative for malaise, night sweats HEENT: No changes in hearing or vision, no nose bleeds or other nasal problems. NECK: Negative for lumps, goiter, pain and significant neck swelling RESPIRATORY: Negative for cough, wheezing CARDIOVASCULAR: Negative for chest pain, leg swelling, palpitations, orthopnea GI: SEE HPI MUSCULOSKELETAL: Negative for joint pain or swelling, back pain, and muscle pain. SKIN: Negative for lesions, rash PSYCH: Negative for sleep disturbance, mood disorder and recent psychosocial stressors. HEMATOLOGY Negative for prolonged bleeding, bruising easily, and swollen nodes. ENDOCRINE: Negative for cold or heat intolerance, polyuria, polydipsia and  goiter. NEURO: negative for tremor, gait imbalance, syncope and seizures. The remainder of the review of systems is noncontributory.   Physical Exam: BP (!) 159/109 (BP Location: Left Arm, Patient Position: Sitting, Cuff Size: Small)   Pulse 94   Temp 98.3 F (36.8 C) (Oral)   Ht '5\' 5"'$  (1.651 m)   Wt 298 lb 9.6 oz (135.4 kg)   LMP 01/23/2014 Comment: spotting  BMI 49.69 kg/m  GENERAL: The patient is AO x3, in no acute distress. Obese. HEENT: Head is normocephalic and atraumatic. EOMI are intact. Mouth is well hydrated and without lesions. NECK: Supple. No masses LUNGS: Clear to auscultation. No presence of rhonchi/wheezing/rales. Adequate chest expansion HEART: RRR, normal s1 and s2. ABDOMEN: tender to palpation in the hypogastric and LLQ area, no guarding, no peritoneal signs, and nondistended. BS +. No masses. EXTREMITIES: Without any cyanosis, clubbing, rash, lesions or edema. NEUROLOGIC: AOx3, no focal motor deficit. SKIN: no jaundice, no rashes   Imaging/Labs: as above  I personally  reviewed and interpreted the available labs, imaging and endoscopic files.  Impression and Plan: Summer [name] is a 60 y.o. female with Pmh heart failure with decreased ejection fraction, fibromyalgia, Bell's palsy, hypothyroidism, hypertension, who presents for evaluation of rectal bleeding.  Patient has presented chronic symptoms of abdominal pain of unclear etiology.  She has not presented any red flag signs besides the presence of rectal bleeding.  She had cross-sectional abdominal imaging in December 2021 that was unremarkable for any alterations that could explain her abdominal pain.  I explained to the patient that the next step will be to perform an EGD to evaluate her abdominal pain further, although I explained that there is a possibility she may have some bowel hypersensitivity/irritable bowel syndrome that is leading to her symptoms.  She would benefit from implementing a low FODMAP diet.  In fact, we will tailor her diet more with the aid of a nutritionist as she is interested in losing weight.  I explained to her that there was a possibility she may need bariatric surgery for major improvement of her weight but she may need to lose significant weight prior in order to become eligible.  The patient should agreed.  For now, we will give her Bentyl to decrease her abdominal pain episodes and will check celiac serology testing. In terms of her GERD episodes, she can restart at the higher dose of omeprazole which may provide better relief of her symptoms, but I also explained to her that she will benefit from losing weight to improve her acid reflux episodes. Finally, we will explore her rectal bleeding episodes with a colonoscopy.  It is possible that this was related to hemorrhoidal bleeding.  She is due for colorectal cancer screening given her history of colon polyps.  -- Explained presumed etiology of IBS symptoms. Patient was counseled about the benefit of implementing a low FODMAP to  improve symptoms and recurrent episodes. A dietary list was provided to the patient. Also, the patient was counseled about the benefit of avoiding stressing situations and potential environmental triggers leading to symptomatology. -Start omeprazole 40 mg qday -Schedule EGD and colonoscopy -Referral to nutrition -Start Bentyl 1 tablet q12h as needed for abdominal pain -Check celiac serology  All questions were answered.      Summer Peppers, MD Gastroenterology and Hepatology [facility]

## 2021-05-28 NOTE — Patient Instructions (Addendum)
Start omeprazole 40 mg qday Schedule EGD and colonoscopy Referral to nutrition Start Bentyl 1 tablet q12h as needed for abdominal pain Perform blood workup

## 2021-06-10 NOTE — Progress Notes (Signed)
Cardiology Office Note   Date:  06/11/2021   ID:  Summer [name], DOB July 21, 1961, MRN [id number]  PCP:  Kathyrn Drown, MD  Cardiologist:   Dorris Carnes, MD       History of Present Illness: Summer [name] is a 60 y.o. female with a history of chronic CP (CT calcium score 0 in Aug 2018), palpitations, HTN, anxiety   She was last seen in cardiology by B Strader in 2019 and S Koneswaran in 2019 In early Jun 2022 she was seen in ED for CP    Trop 22 and 24    The pt was seen by Lance Sell in June   Complained of SOB (chornic)  Noted to be tachycardic    Set up for CT angio of chest  Neg for D Dimer   Emphysema noted and aortic atherosclerosis    Patient says she is still having issues of chest pressure and SOB  She says chest pressure is a heaviness.   Worse with movement   SOB a little when came in to parking lot        Current Meds  Medication Sig   albuterol (PROVENTIL) (2.5 MG/3ML) 0.083% nebulizer solution Take 3 mLs (2.5 mg total) by nebulization every 4 (four) hours as needed for wheezing.   albuterol (VENTOLIN HFA) 108 (90 Base) MCG/ACT inhaler INHALE TWO PUFFS BY MOUTH EVERY 4 HOURS INTO  THE  LUNGS  AS  NEEDED   ALPRAZolam (XANAX) 0.5 MG tablet TAKE 1 TABLET BY MOUTH THREE TIMES DAILY AS NEEDED FOR SLEEP FOR ANXIETY   Ascorbic Acid (VITAMIN C) 500 MG CAPS Take 1 capsule by mouth 2 (two) times daily.    aspirin EC 81 MG tablet Take 81 mg by mouth daily.   cetirizine (ZYRTEC) 10 MG tablet Take 10 mg by mouth daily.   Cholecalciferol (VITAMIN D-3) 5000 UNITS TABS Take 1 tablet by mouth daily.   dicyclomine (BENTYL) 10 MG capsule Take 1 capsule (10 mg total) by mouth every 12 (twelve) hours as needed for spasms (abdominal pain).   FLUoxetine (PROZAC) 20 MG capsule Take 1 capsule (20 mg total) by mouth daily.   Folic Acid-Vit Q000111Q 123456 0.8-50-0.1 MG TABS Take 1 tablet by mouth daily.   furosemide (LASIX) 20 MG tablet TAKE 2 TABLETS BY MOUTH IN THE MORNING AS NEEDED (Patient  taking differently: Take 20 mg by mouth. 1-2 daily)   HYDROcodone-acetaminophen (NORCO/VICODIN) 5-325 MG tablet Take 1 tablet by mouth every 6 (six) hours as needed.   Magnesium 200 MG TABS Take 1 tablet by mouth daily.   Multiple Vitamin (MULTIVITAMIN WITH MINERALS) TABS tablet Take 1 tablet by mouth daily.   Multiple Vitamins-Minerals (ZINC PO) Take 1 tablet by mouth daily.   OLIVE LEAF PO Take 1 tablet by mouth daily.   omeprazole (PRILOSEC) 40 MG capsule Take 1 capsule (40 mg total) by mouth daily.   potassium chloride SA (KLOR-CON) 20 MEQ tablet Take 1 tablet (20 mEq total) by mouth daily.   thyroid (NP THYROID) 60 MG tablet TAKE 1 TABLET BY MOUTH ONCE DAILY BEFORE BREAKFAST     Allergies:   Avelox [moxifloxacin hcl in nacl], Bee venom, and Shellfish allergy   Past Medical History:  Diagnosis Date   Arthritis    Bell's palsy    CHF (congestive heart failure) (HCC)    Dysrhythmia, cardiac    Fibromyalgia    Hashimoto's disease    History of CT scan 06/2017   "  coronary CT scan on 06/01/2017, this did not find any cardiac calcifications. Coronary calcium score of zero."   Hypertension    Thyroid disease     Past Surgical History:  Procedure Laterality Date   COLONOSCOPY N/A 01/23/2014   Procedure: COLONOSCOPY;  Surgeon: Rogene Houston, MD;  [facility];  Service: Endoscopy;  Laterality: N/A;  200   TONSILLECTOMY       Social History:  The patient  reports that she quit smoking about 17 years ago. Her smoking use included cigarettes. She has a 20.00 pack-year smoking history. She has never used smokeless tobacco. She reports that she does not drink alcohol and does not use drugs.   Family History:  The patient's family history includes Cancer in her mother; Other in her daughter. She was adopted.    ROS:  Please see the history of present illness. All other systems are reviewed and  Negative to the above problem except as noted.    PHYSICAL EXAM: VS:  BP  122/84   Pulse 98   Ht '5\' 5"'$  (1.651 m)   Wt 298 lb 12.8 oz (135.5 kg)   LMP 01/23/2014 Comment: spotting  SpO2 100%   BMI 49.72 kg/m   GEN: Morbildly obese 60 yo in no acute distress  HEENT: normal  Neck: no JVD, carotid bruits Cardiac: RRR; no murmurs  No LE edema  Respiratory:  clear to auscultation bilaterally  No wheezes  Moving air OK   GI: soft, nontender, nondistended, + BS  No hepatomegaly  MS: no deformity Moving all extremities   Skin: warm and dry, no rash Neuro:  Strength and sensation are intact Psych: euthymic mood, full affect   EKG:  EKG is not ordered today.  Echo   05/20/21  1. Left ventricular ejection fraction, by estimation, is 60 to 65%. The left ventricle has normal function. The left ventricle has no regional wall motion abnormalities. Left ventricular diastolic parameters are indeterminate. 2. Right ventricular systolic function is normal. The right ventricular size is normal. There is normal pulmonary artery systolic pressure. The estimated right ventricular systolic pressure is 0000000 mmHg. 3. The mitral valve is grossly normal. Trivial mitral valve regurgitation. 4. The aortic valve is tricuspid. Aortic valve regurgitation is not visualized. 5. The inferior vena cava is normal in size with greater than 50% respiratory variability, suggesting right atrial pressure of 3 mmHg. Lipid Panel    Component Value Date/Time   CHOL 173 04/03/2021 1134   TRIG 127 04/03/2021 1134   HDL 52 04/03/2021 1134   CHOLHDL 3.3 04/03/2021 1134   CHOLHDL 3.0 10/07/2016 1537   VLDL 22 10/07/2016 1537   LDLCALC 98 04/03/2021 1134      Wt Readings from Last 3 Encounters:  06/11/21 298 lb 12.8 oz (135.5 kg)  05/28/21 298 lb 9.6 oz (135.4 kg)  04/28/21 299 lb 6.4 oz (135.8 kg)      ASSESSMENT AND PLAN:  1  Dypsnea.   Chronic problem  Moving air on exam   CT neg for PE    Will set up for cardiopulmonary stress test to evaluate limitations further  2     Current  medicines are reviewed at length with the patient today.  The patient does not have concerns regarding medicines.  Signed, Dorris Carnes, MD  06/11/2021 10:08 PM    [address], [place], [place]  [address] Phone: [phone]; Fax: [phone]

## 2021-06-11 ENCOUNTER — Encounter: Payer: Self-pay | Admitting: Internal Medicine

## 2021-06-11 ENCOUNTER — Encounter: Payer: Self-pay | Admitting: *Deleted

## 2021-06-11 ENCOUNTER — Other Ambulatory Visit: Payer: Self-pay

## 2021-06-11 ENCOUNTER — Ambulatory Visit: Payer: 59 | Admitting: Internal Medicine

## 2021-06-11 VITALS — BP 122/84 | HR 98 | Ht 65.0 in | Wt 298.8 lb

## 2021-06-11 DIAGNOSIS — R0602 Shortness of breath: Secondary | ICD-10-CM

## 2021-06-11 NOTE — Patient Instructions (Signed)
Medication Instructions:  Your physician recommends that you continue on your current medications as directed. Please refer to the Current Medication list given to you today.  *If you need a refill on your cardiac medications before your next appointment, please call your pharmacy*   Lab Work: NONE   If you have labs (blood work) drawn today and your tests are completely normal, you will receive your results only by: [place] (if you have MyChart) OR A paper copy in the mail If you have any lab test that is abnormal or we need to change your treatment, we will call you to review the results.   Testing/Procedures: Your physician has recommended that you have a cardiopulmonary stress test (CPX). CPX testing is a non-invasive measurement of heart and lung function. It replaces a traditional treadmill stress test. This type of test provides a tremendous amount of information that relates not only to your present condition but also for future outcomes. This test combines measurements of you ventilation, respiratory gas exchange in the lungs, electrocardiogram (EKG), blood pressure and physical response before, during, and following an exercise protocol.    Follow-Up: At [facility], you and your health needs are our priority.  As part of our continuing mission to provide you with exceptional heart care, we have created designated Provider Care Teams.  These Care Teams include your primary Cardiologist (physician) and Advanced Practice Providers (APPs -  Physician Assistants and Nurse Practitioners) who all work together to provide you with the care you need, when you need it.  We recommend signing up for the patient portal called "MyChart".  Sign up information is provided on this After Visit Summary.  MyChart is used to connect with patients for Virtual Visits (Telemedicine).  Patients are able to view lab/test results, encounter notes, upcoming appointments, etc.  Non-urgent messages can  be sent to your provider as well.   To learn more about what you can do with MyChart, go to [email or web address].    Your next appointment:    To Be Determined   The format for your next appointment:   In Person  Provider:   Dorris Carnes, MD   Other Instructions Thank you for choosing [facility]!

## 2021-06-22 ENCOUNTER — Ambulatory Visit: Payer: 59 | Admitting: Obstetrics & Gynecology

## 2021-07-15 ENCOUNTER — Other Ambulatory Visit (INDEPENDENT_AMBULATORY_CARE_PROVIDER_SITE_OTHER): Payer: Self-pay

## 2021-07-16 ENCOUNTER — Other Ambulatory Visit (INDEPENDENT_AMBULATORY_CARE_PROVIDER_SITE_OTHER): Payer: Self-pay

## 2021-07-17 ENCOUNTER — Telehealth (INDEPENDENT_AMBULATORY_CARE_PROVIDER_SITE_OTHER): Payer: Self-pay

## 2021-07-17 ENCOUNTER — Encounter (INDEPENDENT_AMBULATORY_CARE_PROVIDER_SITE_OTHER): Payer: Self-pay

## 2021-07-17 MED ORDER — PEG 3350-KCL-NA BICARB-NACL 420 G PO SOLR
4000.0000 mL | ORAL | 0 refills | Status: DC
Start: 2021-07-17 — End: 2021-12-23

## 2021-07-17 NOTE — Telephone Encounter (Signed)
Summer [name], CMA  

## 2021-07-27 ENCOUNTER — Other Ambulatory Visit: Payer: Self-pay

## 2021-07-27 ENCOUNTER — Ambulatory Visit (INDEPENDENT_AMBULATORY_CARE_PROVIDER_SITE_OTHER): Payer: 59 | Admitting: Family Medicine

## 2021-07-27 VITALS — BP 128/78 | Wt 302.6 lb

## 2021-07-27 DIAGNOSIS — R002 Palpitations: Secondary | ICD-10-CM

## 2021-07-27 DIAGNOSIS — L719 Rosacea, unspecified: Secondary | ICD-10-CM | POA: Diagnosis not present

## 2021-07-27 DIAGNOSIS — R0609 Other forms of dyspnea: Secondary | ICD-10-CM

## 2021-07-27 DIAGNOSIS — Z79899 Other long term (current) drug therapy: Secondary | ICD-10-CM

## 2021-07-27 DIAGNOSIS — E785 Hyperlipidemia, unspecified: Secondary | ICD-10-CM

## 2021-07-27 DIAGNOSIS — R0600 Dyspnea, unspecified: Secondary | ICD-10-CM | POA: Diagnosis not present

## 2021-07-27 MED ORDER — FLUOXETINE HCL 20 MG PO CAPS: 20.0000 mg | ORAL_CAPSULE | Freq: Every day | ORAL | 4 refills | Status: DC

## 2021-07-27 MED ORDER — ROSUVASTATIN CALCIUM 5 MG PO TABS: 5.0000 mg | ORAL_TABLET | Freq: Every day | ORAL | 5 refills | Status: DC

## 2021-07-27 MED ORDER — DOXYCYCLINE HYCLATE 100 MG PO TABS: 100.0000 mg | ORAL_TABLET | Freq: Every day | ORAL | 1 refills | Status: DC

## 2021-07-27 MED ORDER — ALPRAZOLAM 0.5 MG PO TABS: ORAL_TABLET | ORAL | 2 refills | Status: DC

## 2021-07-27 NOTE — Progress Notes (Signed)
   Subjective:    Patient ID: Summer [name], female    DOB: 31-Mar-1961, 60 y.o.   MRN: [id number]  Hypertension This is a chronic problem. The current episode started more than 1 year ago. Risk factors for coronary artery disease include post-menopausal state and obesity. Treatments tried: lasix. There are no compliance problems.     Patient states she would like to discuss her visit with the cardiologist- she was not comfortable with their recommendations and would like referral to a different cardiologist.  Heart races- feels SOB with the spells, lasts a few seconds to an hour, c/o fatigue, occasionally has cough, some DOE  Patient would like a letter stating she has health problems and needs low cost housing and also needs a letter for her cat to be an emotional support animal.   Review of Systems     Objective:   Physical Exam  Lungs clear heart regular pulse normal extremities trace edema morbid obesity noted      Assessment & Plan:  1. Acne rosacea She does have pustules on her face recommend doxycycline once daily with a snack tall glass of water over the course the next 30 days with 1 refill  2. DOE (dyspnea on exertion) It is hard to know if this is underlying lung issue versus deconditioning versus heart.  Cardiologist wants her to do a stress test she states she cannot get onto a treadmill because of her orthopedic problems we will connect with cardiology to see if they can try different approach  3. Palpitations Patient states over the past several weeks having multiple times where her heart runs fast she is requesting telemetry we will connect with cardiology to see if they are willing  4. Hyperlipidemia, unspecified hyperlipidemia type Hyperlipidemia check lipid profile continue to eat healthy low-dose statin recommended side effects discussed she should be able to tolerate it - Lipid panel  5. High risk medication use See above - Hepatic function panel  6.  Severe obesity (BMI >= 40) (HCC) Important for the patient to try to stay active watch diet to lose weight.  Follow-up 3 months

## 2021-07-29 ENCOUNTER — Ambulatory Visit: Payer: 59 | Admitting: Family Medicine

## 2021-07-30 ENCOUNTER — Other Ambulatory Visit (INDEPENDENT_AMBULATORY_CARE_PROVIDER_SITE_OTHER): Payer: Self-pay

## 2021-08-03 NOTE — Progress Notes (Signed)
Please set patient up for a 3 wk monitor

## 2021-08-06 ENCOUNTER — Encounter: Payer: Self-pay | Admitting: *Deleted

## 2021-08-06 ENCOUNTER — Telehealth: Payer: Self-pay

## 2021-08-06 DIAGNOSIS — R002 Palpitations: Secondary | ICD-10-CM

## 2021-08-06 NOTE — Telephone Encounter (Signed)
Per Dr. Harrington Challenger..   Please set patient up for a 3 wk monitor   LM for the pt to call back.   Ordered placed for 3 week event monitor.

## 2021-08-06 NOTE — Telephone Encounter (Signed)
-----   Message from Fay Records, MD sent at 08/03/2021  9:39 PM EDT -----    ----- Message ----- From: Kathyrn Drown, MD Sent: 07/29/2021  10:32 AM EDT To: Fay Records, MD  Mutual patient-Hi Summer [name] recently saw you.  I appreciate your efforts.  I saw her today in follow-up.  She is complaining of having frequent palpitations and feeling like her heart is running away.  She also states that she does not feel she can do a stress test because of her significant orthopedic issues.  On today's exam I do not find evidence of atrial fibrillation.  [name] is a nice patient but tends to have multiple symptoms and tends to be very symptomatic.  Would telemetry be of any value to rule out more serious pathology?  Also do not know if other measures can be done if she cannot do the stress test.  I appreciate your ongoing help-[facility]

## 2021-08-06 NOTE — Telephone Encounter (Signed)
Left a message for the pt to call back.   Cardiac event monitor ordered.

## 2021-08-06 NOTE — Progress Notes (Signed)
Patient ID: Summer [name], female   DOB: 1961/06/09, 60 y.o.   MRN: [id number] Patient enrolled for [employer] to ship a 21 day cardiac event monitor to her address on file. Letter with instructions mailed to patient.

## 2021-08-07 NOTE — Telephone Encounter (Signed)
Left a message for the pt to call back.  

## 2021-08-10 NOTE — Telephone Encounter (Signed)
Unable to reach the pt x2.. phone says call cannot be completed at this time.

## 2021-08-10 NOTE — Patient Instructions (Signed)
Summer [name]  08/10/2021     @PREFPERIOPPHARMACY @   Your procedure is scheduled on  08/14/2021.   Report to [facility] at  0930 A.M.   Call this number if you have problems the morning of surgery:  [phone]   Remember:  Follow the diet and prep instructions given to you by the office.     Use your nebulizer and your inhaler before you come and bring your rescue inhaler with you.     Take these medicines the morning of surgery with A SIP OF WATER          xanax (if eneded), zyrtec, prozac, hydrocodone (if needed), prilosec, NP Thyroid.     Do not wear jewelry, make-up or nail polish.  Do not wear lotions, powders, or perfumes, or deodorant.  Do not shave 48 hours prior to surgery.  Men may shave face and neck.  Do not bring valuables to the hospital.  [facility] is not responsible for any belongings or valuables.  Contacts, dentures or bridgework may not be worn into surgery.  Leave your suitcase in the car.  After surgery it may be brought to your room.  For patients admitted to the hospital, discharge time will be determined by your treatment team.  Patients discharged the day of surgery will not be allowed to drive home and must have someone with them for 24 hours.    Special instructions:        DO NOT smoke tobacco or vape for 24 hours before your procedure.   Please read over the following fact sheets that you were given. Anesthesia Post-op Instructions and Care and Recovery After Surgery      Upper Endoscopy, Adult, Care After This sheet gives you information about how to care for yourself after your procedure. Your health care provider may also give you more specific instructions. If you have problems or questions, contact your health care provider. What can I expect after the procedure? After the procedure, it is common to have: A sore throat. Mild stomach pain or discomfort. Bloating. Nausea. Follow these instructions at  home:  Follow instructions from your health care provider about what to eat or drink after your procedure. Return to your normal activities as told by your health care provider. Ask your health care provider what activities are safe for you. Take over-the-counter and prescription medicines only as told by your health care provider. If you were given a sedative during the procedure, it can affect you for several hours. Do not drive or operate machinery until your health care provider says that it is safe. Keep all follow-up visits as told by your health care provider. This is important. Contact a health care provider if you have: A sore throat that lasts longer than one day. Trouble swallowing. Get help right away if: You vomit blood or your vomit looks like coffee grounds. You have: A fever. Bloody, black, or tarry stools. A severe sore throat or you cannot swallow. Difficulty breathing. Severe pain in your chest or abdomen. Summary After the procedure, it is common to have a sore throat, mild stomach discomfort, bloating, and nausea. If you were given a sedative during the procedure, it can affect you for several hours. Do not drive or operate machinery until your health care provider says that it is safe. Follow instructions from your health care provider about what to eat or drink after your procedure. Return to your normal  activities as told by your health care provider. This information is not intended to replace advice given to you by your health care provider. Make sure you discuss any questions you have with your health care provider. Document Revised: 10/16/2019 Document Reviewed: 03/20/2018 Elsevier Patient Education  2022 [place]. Colonoscopy, Adult, Care After This sheet gives you information about how to care for yourself after your procedure. Your health care provider may also give you more specific instructions. If you have problems or questions, contact your health  care provider. What can I expect after the procedure? After the procedure, it is common to have: A small amount of blood in your stool for 24 hours after the procedure. Some gas. Mild cramping or bloating of your abdomen. Follow these instructions at home: Eating and drinking  Drink enough fluid to keep your urine pale yellow. Follow instructions from your health care provider about eating or drinking restrictions. Resume your normal diet as instructed by your health care provider. Avoid heavy or fried foods that are hard to digest. Activity Rest as told by your health care provider. Avoid sitting for a long time without moving. Get up to take short walks every 1-2 hours. This is important to improve blood flow and breathing. Ask for help if you feel weak or unsteady. Return to your normal activities as told by your health care provider. Ask your health care provider what activities are safe for you. Managing cramping and bloating  Try walking around when you have cramps or feel bloated. Apply heat to your abdomen as told by your health care provider. Use the heat source that your health care provider recommends, such as a moist heat pack or a heating pad. Place a towel between your skin and the heat source. Leave the heat on for 20-30 minutes. Remove the heat if your skin turns bright red. This is especially important if you are unable to feel pain, heat, or cold. You may have a greater risk of getting burned. General instructions If you were given a sedative during the procedure, it can affect you for several hours. Do not drive or operate machinery until your health care provider says that it is safe. For the first 24 hours after the procedure: Do not sign important documents. Do not drink alcohol. Do your regular daily activities at a slower pace than normal. Eat soft foods that are easy to digest. Take over-the-counter and prescription medicines only as told by your health care  provider. Keep all follow-up visits as told by your health care provider. This is important. Contact a health care provider if: You have blood in your stool 2-3 days after the procedure. Get help right away if you have: More than a small spotting of blood in your stool. Large blood clots in your stool. Swelling of your abdomen. Nausea or vomiting. A fever. Increasing pain in your abdomen that is not relieved with medicine. Summary After the procedure, it is common to have a small amount of blood in your stool. You may also have mild cramping and bloating of your abdomen. If you were given a sedative during the procedure, it can affect you for several hours. Do not drive or operate machinery until your health care provider says that it is safe. Get help right away if you have a lot of blood in your stool, nausea or vomiting, a fever, or increased pain in your abdomen. This information is not intended to replace advice given to you by your health  care provider. Make sure you discuss any questions you have with your health care provider. Document Revised: 10/12/2019 Document Reviewed: 05/14/2019 Elsevier Patient Education  [place] After This sheet gives you information about how to care for yourself after your procedure. Your health care provider may also give you more specific instructions. If you have problems or questions, contact your health care provider. What can I expect after the procedure? After the procedure, it is common to have: Tiredness. Forgetfulness about what happened after the procedure. Impaired judgment for important decisions. Nausea or vomiting. Some difficulty with balance. Follow these instructions at home: For the time period you were told by your health care provider:   Rest as needed. Do not participate in activities where you could fall or become injured. Do not drive or use machinery. Do not drink alcohol. Do not  take sleeping pills or medicines that cause drowsiness. Do not make important decisions or sign legal documents. Do not take care of children on your own. Eating and drinking Follow the diet that is recommended by your health care provider. Drink enough fluid to keep your urine pale yellow. If you vomit: Drink water, juice, or soup when you can drink without vomiting. Make sure you have little or no nausea before eating solid foods. General instructions Have a responsible adult stay with you for the time you are told. It is important to have someone help care for you until you are awake and alert. Take over-the-counter and prescription medicines only as told by your health care provider. If you have sleep apnea, surgery and certain medicines can increase your risk for breathing problems. Follow instructions from your health care provider about wearing your sleep device: Anytime you are sleeping, including during daytime naps. While taking prescription pain medicines, sleeping medicines, or medicines that make you drowsy. Avoid smoking. Keep all follow-up visits as told by your health care provider. This is important. Contact a health care provider if: You keep feeling nauseous or you keep vomiting. You feel light-headed. You are still sleepy or having trouble with balance after 24 hours. You develop a rash. You have a fever. You have redness or swelling around the IV site. Get help right away if: You have trouble breathing. You have new-onset confusion at home. Summary For several hours after your procedure, you may feel tired. You may also be forgetful and have poor judgment. Have a responsible adult stay with you for the time you are told. It is important to have someone help care for you until you are awake and alert. Rest as told. Do not drive or operate machinery. Do not drink alcohol or take sleeping pills. Get help right away if you have trouble breathing, or if you suddenly  become confused. This information is not intended to replace advice given to you by your health care provider. Make sure you discuss any questions you have with your health care provider. Document Revised: 07/03/2020 Document Reviewed: 09/20/2019 Elsevier Patient Education  2022 [employer].

## 2021-08-12 ENCOUNTER — Encounter (HOSPITAL_COMMUNITY): Payer: Self-pay

## 2021-08-12 ENCOUNTER — Encounter (HOSPITAL_COMMUNITY)
Admission: RE | Admit: 2021-08-12 | Discharge: 2021-08-12 | Disposition: A | Discharge status: Home / Self Care | Payer: 59 | Source: Ambulatory Visit | Attending: Gastroenterology | Admitting: Gastroenterology

## 2021-08-12 NOTE — Pre-Procedure Instructions (Signed)
Notified Leigh Jinger Neighbors and Blair Hailey, [place] scheduler that patient did not show for her pre-op today.

## 2021-08-14 ENCOUNTER — Ambulatory Visit (HOSPITAL_COMMUNITY): Admission: RE | Admit: 2021-08-14 | Payer: 59 | Source: Home / Self Care | Admitting: Gastroenterology

## 2021-08-14 ENCOUNTER — Encounter (HOSPITAL_COMMUNITY): Admission: RE | Payer: Self-pay | Source: Home / Self Care

## 2021-08-14 SURGERY — COLONOSCOPY WITH PROPOFOL
Anesthesia: Monitor Anesthesia Care

## 2021-08-17 ENCOUNTER — Telehealth: Payer: Self-pay | Admitting: Family Medicine

## 2021-08-17 NOTE — Telephone Encounter (Signed)
Dr.Ross [facility] ordered this for patient and mailed letter to pt on 08/06/21 with instructions. Pt states she did not receive a letter. Pt advised to contact [facility] for specific instructions. Pt verbalized understanding.

## 2021-08-17 NOTE — Telephone Encounter (Signed)
Patient stated she received a heart monitor in the mail today and there were no instructions. She stated that Dr. Nicki Reaper ordered this for her. Please advise.  CB#  [phone]

## 2021-08-18 NOTE — Telephone Encounter (Signed)
Monitor instructions were included in the monitor kit which had not been opened yet. Reviewed instructions with patient. She had not received letter with instructions mailed 08/06/21.  I will send a duplicate letter to address verified with patient.

## 2021-08-18 NOTE — Telephone Encounter (Signed)
LMVM Please call Doris Gruhn in monitor department at [phone].

## 2021-08-22 ENCOUNTER — Ambulatory Visit (INDEPENDENT_AMBULATORY_CARE_PROVIDER_SITE_OTHER): Payer: 59

## 2021-08-22 DIAGNOSIS — R002 Palpitations: Secondary | ICD-10-CM

## 2021-08-26 ENCOUNTER — Telehealth: Payer: Self-pay | Admitting: Internal Medicine

## 2021-08-26 NOTE — Telephone Encounter (Signed)
Patient having skin reaction to monitor strips, causing redness, bumps, and itching. If necessary patient can remove monitor until she receives the alternative electrodes. Email send to Evelena Asa and customer service at [employer] to please ship a couple monitor [employer] and [employer] Dot Repositionable electrodes [phone]).   If the patient is unable to tolerate this option, she was instructed to call us back, and we will try another type of electrode.

## 2021-08-26 NOTE — Telephone Encounter (Signed)
Will route this message to our monitor techs, to further follow-up and assist the pt with this matter.

## 2021-08-26 NOTE — Telephone Encounter (Signed)
Patient is having an allergic reaction to the adhesive on her heart monitor. Her skin is red, itchy and breaking out.

## 2021-09-11 ENCOUNTER — Telehealth: Payer: Self-pay | Admitting: Internal Medicine

## 2021-09-11 NOTE — Telephone Encounter (Signed)
Patient states she had reaction to tape on skin with first monitor.She received hypoallergenic tape from [employer] and still has problems.She states she has worn monitor for 7 days and she was suppose to wear it for 21 days but at this point, she wants to return monitor. She is going to speak with [employer] again as they have told her they may have another option.    I will message Dr.Ross

## 2021-09-11 NOTE — Telephone Encounter (Signed)
Patient calling in need to speak to someone bout her heart monitor. She having a reaction to it. Please advise

## 2021-09-14 NOTE — Telephone Encounter (Signed)
I left message for patient to update [place] regarding monitor to see if she was able to get new adhesive.

## 2021-09-17 ENCOUNTER — Ambulatory Visit (INDEPENDENT_AMBULATORY_CARE_PROVIDER_SITE_OTHER): Payer: 59 | Admitting: Nurse Practitioner

## 2021-09-17 ENCOUNTER — Other Ambulatory Visit: Payer: Self-pay

## 2021-09-17 ENCOUNTER — Encounter: Payer: Self-pay | Admitting: Nurse Practitioner

## 2021-09-17 VITALS — BP 134/76 | HR 100 | Temp 97.0°F | Ht 65.0 in | Wt 308.8 lb

## 2021-09-17 DIAGNOSIS — R319 Hematuria, unspecified: Secondary | ICD-10-CM | POA: Diagnosis not present

## 2021-09-17 LAB — POCT URINALYSIS DIPSTICK
Spec Grav, UA: 1.015 (ref 1.010–1.025)
pH, UA: 6 (ref 5.0–8.0)

## 2021-09-17 MED ORDER — SULFAMETHOXAZOLE-TRIMETHOPRIM 800-160 MG PO TABS: 1.0000 | ORAL_TABLET | Freq: Two times a day (BID) | ORAL | 0 refills | Status: DC

## 2021-09-17 NOTE — Progress Notes (Signed)
   Subjective:    Patient ID: Summer [name], female    DOB: 02-Jan-1961, 60 y.o.   MRN: [id number]  HPI Pt having lower back pain and noticing blood in urine. Noticed pink tinge to urine on Thursday. Friday saw quarter size spot of blood. Saturday and Sunday was clear. Monday blood returned. Patient report having urgency and frequency. Denies having dysuria.    Patient also admits to not drinking enough water. Patient states that she might drink 2-3 16oz bottles of water a day and there are days she does not drink any water.    Results for orders placed or performed in visit on 09/17/21  POCT Urinalysis Dipstick  Result Value Ref Range   Color, UA     Clarity, UA     Glucose, UA     Bilirubin, UA     Ketones, UA     Spec Grav, UA 1.015 1.010 - 1.025   Blood, UA trace    pH, UA 6.0 5.0 - 8.0   Protein, UA     Urobilinogen, UA     Nitrite, UA     Leukocytes, UA     Appearance     Odor     *No RBC or bacteria noted under microscope.   Review of Systems  Genitourinary:  Positive for frequency, hematuria and urgency. Negative for dysuria and flank pain.  Musculoskeletal:  Positive for back pain.      Objective:   Physical Exam Constitutional:      Appearance: Normal appearance. She is obese.  Cardiovascular:     Rate and Rhythm: Normal rate and regular rhythm.     Heart sounds: Normal heart sounds. No murmur heard.   No gallop.  Pulmonary:     Effort: Pulmonary effort is normal. No respiratory distress.     Breath sounds: Normal breath sounds. No wheezing.  Abdominal:     Tenderness: There is no right CVA tenderness or left CVA tenderness.  Musculoskeletal:     Comments: General pain to lower back. Tender to touch   Neurological:     Mental Status: She is alert.          Assessment & Plan:   1. Hematuria, unspecified type - Start Bactrim 800mg /160mg  BID x3 days. -  Unique symptomology. Hematuria with back pain with frequency. No dysuria. Frequency explained  with use of lasix.  - POCT Urinalysis Dipstick: trace blood, no bacteria or RBCs visualized under microscope. - CULTURE, URINE COMPREHENSIVE - If urine cx neg, will refer to urology for further evaluation. - Drink at least 64oz of water a day. - F/u in two weeks to recheck urine.

## 2021-09-21 ENCOUNTER — Telehealth: Payer: Self-pay | Admitting: Family Medicine

## 2021-09-21 NOTE — Telephone Encounter (Signed)
Patient saw Barbee Shropshire 12/17 for a UTI and was given three days of an antibiotic. Stated that she has stopped bleeding and her back pain is gone. Wanted to get the remaining 10 days of meds. Please advise.  [place]  CB# [phone]

## 2021-09-21 NOTE — Telephone Encounter (Signed)
Pt seen 09/17/21. Please advise. Thank you

## 2021-09-22 ENCOUNTER — Ambulatory Visit: Payer: 59

## 2021-09-22 NOTE — Telephone Encounter (Signed)
FYI  Patient stated she took her last antibiotic last night and didn't know if she would have to start all over again. I explained it took 24 to 48 hours but we were working on her prescription.

## 2021-09-23 NOTE — Telephone Encounter (Signed)
[facility], Trenton Gammon, NP    Please let patient know that her Antibiotic course was only for 3 days and if she is feeling better there is no need for additional antibiotics. If patient is still symptomatic please have patient schedule another appointment.

## 2021-09-23 NOTE — Telephone Encounter (Signed)
Left message to return call 

## 2021-09-23 NOTE — Telephone Encounter (Signed)
Patient returned the call and she has been informed per provider recommendations. She states the bleeding has subsided and back hurts a little bit, patient has been advised per recommendations, she verbalizes understanding.

## 2021-09-25 LAB — CULTURE, URINE COMPREHENSIVE

## 2021-09-25 LAB — SPECIMEN STATUS REPORT

## 2021-09-25 NOTE — Progress Notes (Signed)
Please call the patient with the following message:  Urine Cx was positive, however, you were treated with the appropriate abx. Please let me know if you continue to have symptoms. Otherwise we will see you at your next visit on 10/01/2021.

## 2021-09-28 ENCOUNTER — Telehealth: Payer: Self-pay | Admitting: Internal Medicine

## 2021-09-28 NOTE — Telephone Encounter (Signed)
Summer [name] is calling stating her heart monitor was picked up today, but she realized as the man who picked it up was leaving that she forgot to put the charger in the box. She is wanting to know what she should do due to this.

## 2021-09-28 NOTE — Telephone Encounter (Signed)
I left message for Summer [name] to call [employer] direct to have them make arrangements for her to return charger. [phone]

## 2021-10-01 ENCOUNTER — Ambulatory Visit (INDEPENDENT_AMBULATORY_CARE_PROVIDER_SITE_OTHER): Payer: 59 | Admitting: Nurse Practitioner

## 2021-10-01 ENCOUNTER — Other Ambulatory Visit: Payer: Self-pay

## 2021-10-01 VITALS — BP 138/88 | HR 86 | Temp 97.1°F | Ht 65.0 in | Wt 306.0 lb

## 2021-10-01 DIAGNOSIS — R21 Rash and other nonspecific skin eruption: Secondary | ICD-10-CM

## 2021-10-01 DIAGNOSIS — K219 Gastro-esophageal reflux disease without esophagitis: Secondary | ICD-10-CM | POA: Diagnosis not present

## 2021-10-01 NOTE — Progress Notes (Signed)
Lab reviewed in detail during patient visit.

## 2021-10-01 NOTE — Progress Notes (Signed)
Subjective   Patient ID: Summer [name], female    DOB: 01/27/1961, 60 y.o.   MRN: [id number]   Gastroesophageal Reflux She complains of abdominal pain, nausea and a sore throat. She reports no coughing. Pertinent negatives include no fatigue.    Hematuria Patient here for follow up visit for hematuria. Patient prescribed Bactrim at that time. Patient states that urinary symptoms have resolved.    GERD Patient would like to address her GERD today. Patient states that she has a burning sensation to her throat despite taking her Protonix as prescribed. Patient states that she feels the burning all the time and that she is concerned about damage to her throat caused by her hx of GERD. Patient admits that GERD is worse when she becomes anxious and worse in the morning. She has tried to take her protonix around 4am which sometimes helps.  Patient had an endoscopy and colonoscopy scheduled on 08/14/2021 but she cancelled it because she was unsure if she should attend that appointment due to her cardiac hx. Patient has not been able to reschedule.    Rash Patient also has a concern of a recent development of hive like rash that she occasionally experiences at home. Patient describes hive-like rash that is very itchy and red and appear on her back chest, arms, and neck. Patient states that hives are relieved with Zyrtec. Patient believes she is allergic to something in her apartment. Patient also believes she has mold in her apartment.    Review of Systems  Constitutional:  Negative for activity change, appetite change, chills, diaphoresis, fatigue, fever and unexpected weight change.  HENT:  Positive for sore throat. Negative for congestion, postnasal drip, sinus pressure, sinus pain and trouble swallowing.   Respiratory:  Negative for cough, chest tightness and shortness of breath.   Gastrointestinal:  Positive for abdominal pain and nausea. Negative for abdominal distention, blood in stool,  constipation, diarrhea and vomiting.  Genitourinary:  Negative for dysuria, frequency, hematuria and urgency.  Skin:  Positive for rash. Negative for color change, pallor and wound.  Neurological:  Negative for light-headedness and headaches.        Objective:    Objective   Physical Exam Constitutional:      General: She is not in acute distress.    Appearance: Normal appearance. She is obese. She is not ill-appearing or toxic-appearing.  Cardiovascular:     Rate and Rhythm: Normal rate and regular rhythm.     Pulses: Normal pulses.     Heart sounds: Normal heart sounds. No murmur heard. Pulmonary:     Effort: Pulmonary effort is normal. No respiratory distress.     Breath sounds: Normal breath sounds. No wheezing.  Abdominal:     General: Abdomen is protuberant. Bowel sounds are normal. There is no distension.     Palpations: Abdomen is soft. There is no shifting dullness, fluid wave, hepatomegaly, splenomegaly, mass or pulsatile mass.     Tenderness: There is abdominal tenderness in the epigastric area. There is no guarding or rebound. Negative signs include Murphy's sign and McBurney's sign.     Hernia: No hernia is present.  Skin:    General: Skin is warm.     Findings: No erythema, lesion or rash.  Neurological:     General: No focal deficit present.     Mental Status: She is alert and oriented to person, place, and time.  Psychiatric:        Mood and Affect: Mood normal.  Behavior: Behavior normal.                Assessment & Plan:  1. Gastroesophageal reflux disease, unspecified whether esophagitis present - Possibly sequelae to long term GERD - Endoscopy to r/o esophagitis - CBC with Differential to assess if anemia is present and WBC elevation - Ambulatory referral to Gastroenterology for colonoscopy and endoscopy - Patient has an outstanding lab bill that she is unable to pay. As a result, [facility] will not draw labs on her. CBC ordered but patient may  not be able to have it drawn. - May take OTC Tums - Avoid high fatty and high acidic foods. - Next appointment 10/27/2021 - Go to ED or urgent care if you experience coughing up blood, worsening pain, fever, chills.    2. Rash - CBC with Differential to assess WBC - Ambulatory referral to Allergy for allergy testing - Next appointment 10/27/2021 - Go to ED if you experience anaphylaxis reaction and or have difficulty breathing.

## 2021-10-02 ENCOUNTER — Encounter: Payer: Self-pay | Admitting: Nurse Practitioner

## 2021-10-02 NOTE — Progress Notes (Signed)
Results reviewed with patient during OV

## 2021-10-05 ENCOUNTER — Telehealth: Payer: Self-pay | Admitting: Nurse Practitioner

## 2021-10-05 ENCOUNTER — Telehealth (INDEPENDENT_AMBULATORY_CARE_PROVIDER_SITE_OTHER): Payer: 59 | Admitting: Nurse Practitioner

## 2021-10-05 DIAGNOSIS — K219 Gastro-esophageal reflux disease without esophagitis: Secondary | ICD-10-CM

## 2021-10-05 DIAGNOSIS — R079 Chest pain, unspecified: Secondary | ICD-10-CM

## 2021-10-05 NOTE — Telephone Encounter (Signed)
Referral to allergy specialist was ordered by the provider 10/01/21 Patient notified.

## 2021-10-05 NOTE — Telephone Encounter (Signed)
Spoke with Dr. Harrington Challenger with Cardiology regarding if patient's cardiac health was stable for a colonoscopy and endoscopy with anesthesia. Dr. Harrington Challenger suggests conducting a stress test prior to the colonoscopy and endoscopy.  Spoke with patient to inform her of the recommendation. Patient states that she would like to have the endoscopy done without anesthesia first since her throat is hurting so bad. Patient would like to then schedule a stress test and then do the colonoscopy afterwards if she is candidate for it.   Will place referral to gastro for patient and specialist to discuss endoscopy without profofol. Will also place referral to cardiology for stress test.

## 2021-10-05 NOTE — Telephone Encounter (Signed)
Patient is seen 12/1 and wanting to if you sent in referral for allergist specialist too.She states you spoke about this at her visit.

## 2021-10-06 ENCOUNTER — Telehealth: Payer: Self-pay | Admitting: Internal Medicine

## 2021-10-06 NOTE — Telephone Encounter (Signed)
Pt notified and verbalized understanding of results

## 2021-10-06 NOTE — Telephone Encounter (Signed)
Patient states she was returning call. Please advise  

## 2021-10-06 NOTE — Telephone Encounter (Signed)
Fay Records, MD  10/04/2021  9:33 PM EST     For time she wore monitor no arrhythmias detected.

## 2021-10-07 ENCOUNTER — Encounter (INDEPENDENT_AMBULATORY_CARE_PROVIDER_SITE_OTHER): Payer: Self-pay | Admitting: *Deleted

## 2021-10-19 ENCOUNTER — Other Ambulatory Visit: Payer: Self-pay

## 2021-10-19 ENCOUNTER — Ambulatory Visit (INDEPENDENT_AMBULATORY_CARE_PROVIDER_SITE_OTHER): Payer: 59 | Admitting: Gastroenterology

## 2021-10-19 ENCOUNTER — Encounter (INDEPENDENT_AMBULATORY_CARE_PROVIDER_SITE_OTHER): Payer: Self-pay | Admitting: Gastroenterology

## 2021-10-19 VITALS — BP 179/118 | HR 89 | Temp 97.8°F | Ht 65.0 in | Wt 299.3 lb

## 2021-10-19 DIAGNOSIS — K219 Gastro-esophageal reflux disease without esophagitis: Secondary | ICD-10-CM | POA: Diagnosis not present

## 2021-10-19 DIAGNOSIS — K591 Functional diarrhea: Secondary | ICD-10-CM

## 2021-10-19 MED ORDER — SUCRALFATE 1 GM/10ML PO SUSP: 1.0000 g | Freq: Three times a day (TID) | ORAL | 1 refills | Status: DC

## 2021-10-19 NOTE — Patient Instructions (Addendum)
We will get you scheduled for EGD and colonoscopy You can continue Pepcid 20mg  twice a day and restart your zyrtec 10mg  twice a day after seeing the allergist. I have sent carafate for you to try in the meantime, this is a liquid you will drink prior to eating and at bedtime, if it is too expensive let me know. Please keep your appt with the allergist for further evaluation of your symptoms.    Follow up will be determined after EGD and colonoscopy

## 2021-10-19 NOTE — Progress Notes (Signed)
Referring Provider: Ameduite, Trenton Gammon, NP Primary Care Physician:  Kathyrn Drown, MD Primary GI Physician: Jenetta Downer   Chief Complaint  Patient presents with   Gastroesophageal Reflux    Burning in throat, inside of mouth burning, lips burning, has lost weight due not wanting to eat due to reflux. Cancel colonoscopy back in October and would like to reschedule.    HPI:   Summer [name] is a 60 y.o. female with past medical history of heart failure (normal EF July 2022), Fibromyalgia, Bell's palsy, hypothyroidism, HTN.   Patient presenting today for follow up of GERD and Rectal bleeding, last seen 05/28/21. Patient had EGD and colonoscopy scheduled for October, but was advised by PCP to cancel procedures until she could be cleared by cardiologist as she was having some irregular heartbeats. She was orderd a holter monitor to wear x21 days but could only complete 7 due to allergic reaction from the electrode stickers. She reports no significant abnormalities were noted during those 7 days. She reports that cardiologist cleared her to have procedures but she cannot see her until April for in person evaluation.   She was started on omeprazole 40mg  at last visit, due to uncontrolled GERD on omeprazole 20mg  daily, she was recommended to have EGD done for ongoing reflux, . She reports that even after increase in PPI dosing, she was still having daily reflux. She stopped the omeprazole and started zyrtec 10mg  bid and pepcid 20mg  bid. She reports significant improvement in her symptoms. She also reports that her abdominal pain and swelling in her legs improved. She is seeing the allergist on Wednesday but was told she had to stop her zyrtec due to upcoming allergy appt, she reports that she is having worsening gerd, with epigastric pain and burning in her throat with almost anything she eats or drinks.  She has occasional dysphagia and feels as though she has a lump in her throat which also improved  with the medication, she denies sore throat or cough. She endorses some nausea without vomiting. She also has a rash on her back, she states that she has cut out many different foods and is eating mostly cheese toast at this point because it is all she can tolerate that does not cause significant burning to her throat and mouth.  Rectal bleeding has resolved, she has had no further bleeding since her last OV. She reports that she has diarrhea intermittently, she has normal BMs in between episodes of diarrhea, 1-2x/day. No reported melena.   Last Colonoscopy:Examination performed to cecum. Two small polyps ablated via cold biopsy and submitted together. One was located at ascending colon and the second one at descending colon. External hemorrhoids. Last Endoscopy:never  Recommendations:  Needs colonoscopy and EGD  Past Medical History:  Diagnosis Date   Arthritis    Bell's palsy    CHF (congestive heart failure) (HCC)    Dysrhythmia, cardiac    Fibromyalgia    Hashimoto's disease    History of CT scan 06/2017   "coronary CT scan on 06/01/2017, this did not find any cardiac calcifications. Coronary calcium score of zero."   Hypertension    Thyroid disease     Past Surgical History:  Procedure Laterality Date   COLONOSCOPY N/A 01/23/2014   Procedure: COLONOSCOPY;  Surgeon: Rogene Houston, MD;  Location: [facility];  Service: Endoscopy;  Laterality: N/A;  200   TONSILLECTOMY      Current Outpatient Medications  Medication Sig Dispense Refill  albuterol (PROVENTIL) (2.5 MG/3ML) 0.083% nebulizer solution Take 3 mLs (2.5 mg total) by nebulization every 4 (four) hours as needed for wheezing. 75 mL 11   albuterol (VENTOLIN HFA) 108 (90 Base) MCG/ACT inhaler INHALE TWO PUFFS BY MOUTH EVERY 4 HOURS INTO  THE  LUNGS  AS  NEEDED 18 g 3   ALPRAZolam (XANAX) 0.5 MG tablet 1 bid prn anxiety or insomnia caution drowsiness 30 tablet 2   Ascorbic Acid (VITAMIN C) 500 MG CAPS Take 1 capsule by  mouth 2 (two) times daily.      cetirizine (ZYRTEC) 10 MG tablet Take 10 mg by mouth 2 (two) times daily.     Cholecalciferol (VITAMIN D-3) 5000 UNITS TABS Take 1 tablet by mouth daily.     famotidine (PEPCID) 20 MG tablet Take 20 mg by mouth 2 (two) times daily.     Folic Acid-Vit F5-DDU K02 0.8-50-0.1 MG TABS Take 1 tablet by mouth daily.     furosemide (LASIX) 20 MG tablet TAKE 2 TABLETS BY MOUTH IN THE MORNING AS NEEDED (Patient taking differently: Take 20 mg by mouth. 1-2 daily) 60 tablet 5   HYDROcodone-acetaminophen (NORCO/VICODIN) 5-325 MG tablet Take 1 tablet by mouth every 6 (six) hours as needed.     Magnesium 200 MG TABS Take 1 tablet by mouth daily.     Multiple Vitamin (MULTIVITAMIN WITH MINERALS) TABS tablet Take 1 tablet by mouth daily.     Multiple Vitamins-Minerals (ZINC PO) Take 1 tablet by mouth daily.     OLIVE LEAF PO Take 1 tablet by mouth daily.     potassium chloride SA (KLOR-CON) 20 MEQ tablet Take 1 tablet (20 mEq total) by mouth daily. 30 tablet 5   rosuvastatin (CRESTOR) 5 MG tablet Take 1 tablet (5 mg total) by mouth daily. 30 tablet 5   thyroid (NP THYROID) 60 MG tablet TAKE 1 TABLET BY MOUTH ONCE DAILY BEFORE BREAKFAST 30 tablet 5   polyethylene glycol-electrolytes (TRILYTE) 420 g solution Take 4,000 mLs by mouth as directed. (Patient not taking: Reported on 10/19/2021) 4000 mL 0   No current facility-administered medications for this visit.    Allergies as of 10/19/2021 - Review Complete 10/19/2021  Allergen Reaction Noted   Avelox [moxifloxacin hcl in nacl] Other (See Comments) 11/22/2013   Bee venom Hives 09/06/2012   Shellfish allergy Hives 09/06/2012    Family History  Adopted: Yes  Problem Relation Age of Onset   Cancer Mother        lung   Other Daughter        Lyme's disease    Social History   Socioeconomic History   Marital status: Married    Spouse name: Not on file   Number of children: Not on file   Years of education: Not on file    Highest education level: Not on file  Occupational History   Not on file  Tobacco Use   Smoking status: Former    Packs/day: 1.00    Years: 20.00    Pack years: 20.00    Types: Cigarettes    Quit date: 11/08/2003    Years since quitting: 17.9   Smokeless tobacco: Never   Tobacco comments:    quit smoking 10 yrs   Vaping Use   Vaping Use: Never used  Substance and Sexual Activity   Alcohol use: No   Drug use: No   Sexual activity: Yes    Birth control/protection: Post-menopausal  Other Topics Concern   Not on  file  Social History Narrative   Not on file   Social Determinants of Health   Financial Resource Strain: Not on file  Food Insecurity: Not on file  Transportation Needs: Not on file  Physical Activity: Not on file  Stress: Not on file  Social Connections: Not on file   Review of systems General: negative for malaise, night sweats, fever, chills, weight loss Neck: Negative for lumps, goiter, pain and significant neck swelling Resp: Negative for cough, wheezing, dyspnea at rest CV: Negative for chest pain, leg swelling, palpitations, orthopnea GI: denies melena, hematochezia, nausea, vomiting,constipation, dysphagia, odyonophagia, early satiety or unintentional weight loss. +diarrhea +burning to throat and mouth  MSK: Negative for joint pain or swelling, back pain, and muscle pain. Derm: rash present to back  Psych: Denies depression, anxiety, memory loss, confusion. No homicidal or suicidal ideation.  Heme: Negative for prolonged bleeding, bruising easily, and swollen nodes. Endocrine: Negative for cold or heat intolerance, polyuria, polydipsia and goiter. Neuro: negative for tremor, gait imbalance, syncope and seizures. The remainder of the review of systems is noncontributory.  Physical Exam: BP (!) 179/118 (BP Location: Right Arm, Patient Position: Sitting, Cuff Size: Normal) Comment (Cuff Size): forearm   Pulse 89    Temp 97.8 F (36.6 C) (Oral)    Ht 5\' 5"   (1.651 m)    Wt 299 lb 4.8 oz (135.8 kg)    LMP 01/23/2014 Comment: spotting   BMI 49.81 kg/m  General:   Alert and oriented. No distress noted. Pleasant and cooperative.  Head:  Normocephalic and atraumatic. Eyes:  Conjuctiva clear without scleral icterus. Mouth:  Oral mucosa pink and moist. Good dentition. No lesions. Heart: Normal rate and rhythm, s1 and s2 heart sounds present.  Lungs: Clear lung sounds in all lobes. Respirations equal and unlabored. Abdomen:  +BS, soft, non-tender and non-distended. No rebound or guarding. No HSM or masses noted. Derm: No palmar erythema or jaundice. Diffuse red rash to back. Msk:  Symmetrical without gross deformities. Normal posture. Extremities:  Without edema. Neurologic:  Alert and  oriented x4 Psych:  Alert and cooperative. Normal mood and affect.  Invalid input(s): 6 MONTHS   ASSESSMENT: PRISTINE GLADHILL is a 60 y.o. female presenting today for ongoing GERD symptoms.  Reflux symptoms did not improve even with increased dose of omeprazole. She reports she did some research and felt that she may have Mast cell disease, she stopped PPI and started zyrtec 10mg  BID and Pepcid 20mg  BID which improved her symptoms quite a bit, however, they did not resolve completely. She is seeing an allergist for further evaluation on Wednesday and unfortunately had to stop her zyrtec prior to the visit. She reports that reflux symptoms have since worsened. I am sending carafate 1g for her to take in the meantime to see if this provides any relief of her throat burning, she should continue her Pepcid 20mg  BID and we will get her scheduled for EGD as her Echo in July revealed normal EF. She should keep her appt with allergist on Wednesday, as I suspect there is some aspect of histamine response causing her severe mouth and throat burning, given H2B in addition to an H1B have provided better control of her symptoms.   Rectal bleeding resolved and she has had no further  episodes since last OV in July, 2022, however, she is over due for surveillance colonoscopy, we will get her scheduled for this.    PLAN:  EGD and colonoscopy 2. Continue Pepcid 20mg   BID, restart zyrtec 10mg  BID 3. Carafate 1g before meals and at bedtime. 4. Keep appt with Allergist   Follow Up: TBD after double  Lillyana Majette L. Alver Sorrow, MSN, APRN, AGNP-C Adult-Gerontology Nurse Practitioner [facility]

## 2021-10-21 ENCOUNTER — Ambulatory Visit: Payer: 59 | Admitting: Allergy & Immunology

## 2021-10-21 ENCOUNTER — Encounter: Payer: Self-pay | Admitting: Allergy & Immunology

## 2021-10-21 ENCOUNTER — Other Ambulatory Visit: Payer: Self-pay

## 2021-10-21 VITALS — BP 132/92 | HR 97 | Temp 98.2°F | Ht 65.0 in | Wt 301.6 lb

## 2021-10-21 DIAGNOSIS — J3089 Other allergic rhinitis: Secondary | ICD-10-CM

## 2021-10-21 DIAGNOSIS — T7840XD Allergy, unspecified, subsequent encounter: Secondary | ICD-10-CM

## 2021-10-21 MED ORDER — CROMOLYN SODIUM 100 MG/5ML PO CONC: 200.0000 mg | Freq: Four times a day (QID) | ORAL | 1 refills | Status: AC

## 2021-10-21 MED ORDER — MONTELUKAST SODIUM 10 MG PO TABS: 10.0000 mg | ORAL_TABLET | Freq: Every day | ORAL | 5 refills | Status: DC

## 2021-10-21 NOTE — Progress Notes (Addendum)
NEW PATIENT  Date of Service/Encounter:  10/21/21  Consult requested by: Kathyrn Drown, MD   Assessment:   Perennial allergic rhinitis (indoor molds)  Allergic reaction - unknown trigger  Consider spiro at the next visit  Plan/Recommendations:   1. Perennial allergic rhinitis - Testing today showed: indoor molds. - Copy of test results provided. - Avoidance measures provided. - Continue with: Zyrtec (cetirizine) 10mg  tablet twice daily and Pepcid (famotidine) 40mg  twice daily - Start taking: Singulair (montelukast) 10mg  daily and Cromolyn 200mg  four times daily (30 min before meals).  - You can use an extra dose of the antihistamine, if needed, for breakthrough symptoms.   2. Allergic reaction - unknown trigger - Testing to all of the selected foods was negative. - There is a the low positive predictive value of food allergy testing and hence the high possibility of false positives. - In contrast, food allergy testing has a high negative predictive value, therefore if testing is negative we can be relatively assured that they are indeed negative.  - We are going to tentatively treat this as a mast cell mediated disease, although I am not sure what is going on with you. - I do want to do labs when you are able to get those done, but call us when you can step foot back into [facility].  3. Return in about 2 months (around 12/22/2021).    This note in its entirety was forwarded to the Provider who requested this consultation.  Subjective:   Summer [name] is a 60 y.o. female presenting today for evaluation of  Chief Complaint  Patient presents with   Allergic Reaction    Constant watering eyes, patient has been having reactions such as arm, back, and chest burning. Breathing is affected as well, to certain smells, such as alcohol, her face cleanser. Also states she's been itchy. She was taking Pepcid and zyrtec together to help with the reaction and this has helped.     Summer [name] has a history of the following: Patient Active Problem List   Diagnosis Date Noted   Severe obesity (BMI >= 40) ([place]) 05/28/2021   Chest pain 12/17/2020   Left upper quadrant abdominal pain 12/17/2020   Hematuria 11/05/2020   Cough 11/05/2020   Group B streptococcal UTI 11/02/2020   Acute cystitis with hematuria 10/28/2020   Ketonuria 10/28/2020   Rectal bleeding 10/28/2020   Postmenopausal bleeding 08/10/2018   Palpitations 10/09/2016   Prediabetes 06/10/2014   Subclinical hypothyroidism 06/10/2014   Abdominal pain 12/26/2013   Esophageal reflux 04/01/2013   Fibromyalgia 03/22/2013   Hypokalemia 03/22/2013   Facial paralysis/Bells palsy 02/01/2013    History obtained from: chart review and patient.  Summer [name] was referred by Kathyrn Drown, MD.     Summer [name] is a 59 y.o. female presenting for an evaluation of reactions to "everything and nothing" .   Smells are a big trigger and they have gotten a whole lot worse. She can go into a public place and there is cologne and her arms and skin and back starts burning. She reports that she has dizziness. She is reacting to borax and the smell of alcohol in her office. She started taking cetirizine and famotidine and this is working well. She had to stop for the last three days for the rest today. It is getting worse. She also reacts to to something in the car; she thinks that it might be mold. She cannot figure out what else  it might be. She avoids anything with scents like air fresheners and scents.    Allergic Rhinitis Symptom History: She had allergy testing when she was very young.   She did have allergy shots when she was very young for a "long time". She does get occasional sinus infections and she has congestion intermittently. She did have a sinus infection a couple of weeks ago. But she does not get them routinely.   Food Allergy Symptom History: She has a shellfish allergy with hives. Last time that  this happened was six years ago and she was eating Benadryl around the clock for one week.  She reports that she is sensitive to milk.  Skin Symptom History: She uses baby oil to calm down her skin.   Stinging Insect Symptom History: She reacts to yellow jacket. She had an EpiPen when she was little but she has not had one in a long time. She had venom shots at some point.   GERD Symptom History: She reports reflux symptoms for a long time and it has gotten back in the last month or so.  She was on omeprazole 60mg  and it was not working. The cetirizine/famotidine combination is working well. She is scheduled for colonoscopy and endoscopy in January 2023. She is followed by Dr. Eula Listen here in [place].   She has mold in the building where she lives. She also has one of our other patients who she lives near.   Otherwise, there is no history of other atopic diseases, including asthma, drug allergies, or contact dermatitis. There is no significant infectious history. Vaccinations are up to date.    Past Medical History: Patient Active Problem List   Diagnosis Date Noted   Severe obesity (BMI >= 40) ([place]) 05/28/2021   Chest pain 12/17/2020   Left upper quadrant abdominal pain 12/17/2020   Hematuria 11/05/2020   Cough 11/05/2020   Group B streptococcal UTI 11/02/2020   Acute cystitis with hematuria 10/28/2020   Ketonuria 10/28/2020   Rectal bleeding 10/28/2020   Postmenopausal bleeding 08/10/2018   Palpitations 10/09/2016   Prediabetes 06/10/2014   Subclinical hypothyroidism 06/10/2014   Abdominal pain 12/26/2013   Esophageal reflux 04/01/2013   Fibromyalgia 03/22/2013   Hypokalemia 03/22/2013   Facial paralysis/Bells palsy 02/01/2013    Medication List:  Allergies as of 10/21/2021       Reactions   Avelox [moxifloxacin Hcl In Nacl] Other (See Comments)   "heart beats weird"   Bee Venom Hives   Shellfish Allergy Hives        Medication List        Accurate as of  October 21, 2021 11:59 PM. If you have any questions, ask your nurse or doctor.          albuterol (2.5 MG/3ML) 0.083% nebulizer solution Commonly known as: PROVENTIL Take 3 mLs (2.5 mg total) by nebulization every 4 (four) hours as needed for wheezing.   albuterol 108 (90 Base) MCG/ACT inhaler Commonly known as: VENTOLIN HFA INHALE TWO PUFFS BY MOUTH EVERY 4 HOURS INTO  THE  LUNGS  AS  NEEDED   ALPRAZolam 0.5 MG tablet Commonly known as: XANAX 1 bid prn anxiety or insomnia caution drowsiness   cetirizine 10 MG tablet Commonly known as: ZYRTEC Take 10 mg by mouth 2 (two) times daily.   cromolyn 100 MG/5ML solution Commonly known as: GASTROCROM Take 10 mLs (200 mg total) by mouth in the morning, at noon, in the evening, and at bedtime. Started by: Gwenith Daily  Ernst Bowler, MD   famotidine 20 MG tablet Commonly known as: PEPCID Take 20 mg by mouth 2 (two) times daily.   Folic Acid-Vit T2-IZT I45 0.8-50-0.1 MG Tabs Take 1 tablet by mouth daily.   furosemide 20 MG tablet Commonly known as: LASIX TAKE 2 TABLETS BY MOUTH IN THE MORNING AS NEEDED What changed:  how much to take how to take this additional instructions   HYDROcodone-acetaminophen 5-325 MG tablet Commonly known as: NORCO/VICODIN Take 1 tablet by mouth every 6 (six) hours as needed.   Magnesium 200 MG Tabs Take 1 tablet by mouth daily.   montelukast 10 MG tablet Commonly known as: Singulair Take 1 tablet (10 mg total) by mouth at bedtime. Started by: Valentina Shaggy, MD   multivitamin with minerals Tabs tablet Take 1 tablet by mouth daily.   OLIVE LEAF PO Take 1 tablet by mouth daily.   polyethylene glycol-electrolytes 420 g solution Commonly known as: TriLyte Take 4,000 mLs by mouth as directed.   potassium chloride SA 20 MEQ tablet Commonly known as: KLOR-CON M Take 1 tablet (20 mEq total) by mouth daily.   rosuvastatin 5 MG tablet Commonly known as: Crestor Take 1 tablet (5 mg total)  by mouth daily.   sucralfate 1 GM/10ML suspension Commonly known as: CARAFATE Take 10 mLs (1 g total) by mouth 4 (four) times daily -  with meals and at bedtime.   thyroid 60 MG tablet Commonly known as: NP Thyroid TAKE 1 TABLET BY MOUTH ONCE DAILY BEFORE BREAKFAST   Vitamin C 500 MG Caps Take 1 capsule by mouth 2 (two) times daily.   Vitamin D-3 125 MCG (5000 UT) Tabs Take 1 tablet by mouth daily.   ZINC PO Take 1 tablet by mouth daily.        Birth History: non-contributory  Developmental History: non-contributory  Past Surgical History: Past Surgical History:  Procedure Laterality Date   COLONOSCOPY N/A 01/23/2014   Procedure: COLONOSCOPY;  Surgeon: Rogene Houston, MD;  Location: [facility];  Service: Endoscopy;  Laterality: N/A;  200   TONSILLECTOMY       Family History: Family History  Adopted: Yes  Problem Relation Age of Onset   Cancer Mother        lung   Other Father    Other Daughter        Lyme's disease     Social History: Summer [name] lives at home with her husband.  She was in an apartment of unknown age.  There is tiling throughout the apartment.  She has electric heating and window units for cooling.  There is 1 cat inside of the home.  There are no dust mite covers on the bedding.  There is tobacco exposure in the home.  She also works for [employer], but spends majority of her time helping her husband who is disabled and has a lot of medical issues.  She is not exposed to fumes, chemicals, or dust.   Review of Systems  Constitutional: Negative.  Negative for chills, fever, malaise/fatigue and weight loss.  HENT: Negative.  Negative for congestion, ear discharge and ear pain.   Eyes:  Negative for pain, discharge and redness.  Respiratory:  Negative for cough, sputum production, shortness of breath and wheezing.   Cardiovascular: Negative.  Negative for chest pain and palpitations.  Gastrointestinal:  Negative for abdominal pain, constipation,  diarrhea, heartburn, nausea and vomiting.  Skin:  Positive for itching and rash.  Neurological:  Negative for dizziness  and headaches.  Endo/Heme/Allergies:  Negative for environmental allergies. Does not bruise/bleed easily.      Objective:   Blood pressure (!) 132/92, pulse 97, temperature 98.2 F (36.8 C), temperature source Temporal, height 5\' 5"  (1.651 m), weight (!) 301 lb 9.6 oz (136.8 kg), last menstrual period 01/23/2014, SpO2 97 %. Body mass index is 50.19 kg/m.   Physical Exam:   Physical Exam Vitals reviewed.  Constitutional:      Appearance: She is well-developed.  HENT:     Head: Normocephalic and atraumatic.     Right Ear: Tympanic membrane, ear canal and external ear normal. No drainage, swelling or tenderness. Tympanic membrane is not injected, scarred, erythematous, retracted or bulging.     Left Ear: Tympanic membrane, ear canal and external ear normal. No drainage, swelling or tenderness. Tympanic membrane is not injected, scarred, erythematous, retracted or bulging.     Nose: No nasal deformity, septal deviation, mucosal edema or rhinorrhea.     Right Turbinates: Enlarged, swollen and pale.     Left Turbinates: Enlarged, swollen and pale.     Right Sinus: No maxillary sinus tenderness or frontal sinus tenderness.     Left Sinus: No maxillary sinus tenderness or frontal sinus tenderness.     Mouth/Throat:     Mouth: Mucous membranes are not pale and not dry.     Pharynx: Uvula midline.  Eyes:     General:        Right eye: No discharge.        Left eye: No discharge.     Conjunctiva/sclera: Conjunctivae normal.     Right eye: Right conjunctiva is not injected. No chemosis.    Left eye: Left conjunctiva is not injected. No chemosis.    Pupils: Pupils are equal, round, and reactive to light.  Cardiovascular:     Rate and Rhythm: Normal rate and regular rhythm.     Heart sounds: Normal heart sounds.  Pulmonary:     Effort: Pulmonary effort is normal. No  tachypnea, accessory muscle usage or respiratory distress.     Breath sounds: Normal breath sounds. No wheezing, rhonchi or rales.  Chest:     Chest wall: No tenderness.  Abdominal:     Tenderness: There is no abdominal tenderness. There is no guarding or rebound.  Lymphadenopathy:     Head:     Right side of head: No submandibular, tonsillar or occipital adenopathy.     Left side of head: No submandibular, tonsillar or occipital adenopathy.     Cervical: No cervical adenopathy.  Skin:    General: Skin is warm.     Capillary Refill: Capillary refill takes less than 2 seconds.     Coloration: Skin is not pale.     Findings: No abrasion, erythema, petechiae or rash. Rash is not papular, urticarial or vesicular.     Comments: Skin somewhat sensitive.   Neurological:     Mental Status: She is alert.     Diagnostic studies:   Allergy Studies:     Airborne Adult Perc - 10/21/21 1545     Time Antigen Placed 1545    Allergen Manufacturer Lavella Hammock    Location Back    Number of Test 59    Panel 1 Select    1. Control-Buffer 50% Glycerol Negative    2. Control-Histamine 1 mg/ml 2+    3. Albumin saline Negative    4. [place] Negative    5. [place] Negative    6. Johnson Negative  7. [facility] Negative    8. Meadow Fescue Negative    9. Perennial Rye Negative    10. Sweet Vernal Negative    11. Timothy Negative    12. Cocklebur Negative    13. Burweed Marshelder Negative    14. Ragweed, short Negative    15. Ragweed, Giant Negative    16. Plantain,  English Negative    17. Lamb's Quarters Negative    18. Sheep Sorrell Negative    19. Rough Pigweed Negative    20. Marsh Elder, Rough Negative    21. Mugwort, Common Negative    22. Ash mix Negative    23. Birch mix Negative    24. Beech American Negative    25. Box, Elder Negative    26. Cedar, red Negative    27. Cottonwood, [place] Negative    28. Elm mix Negative    29. Hickory Negative    30. Maple mix Negative     31. Oak, [place] mix Negative    32. Pecan Pollen Negative    33. Pine mix Negative    34. Sycamore Eastern Negative    35. [place], Black Pollen Negative    36. Alternaria alternata Negative    37. Cladosporium Herbarum Negative    38. Aspergillus mix Negative    39. Penicillium mix Negative    40. Bipolaris sorokiniana (Helminthosporium) Negative    41. Drechslera spicifera (Curvularia) Negative    42. Mucor plumbeus Negative    43. Fusarium moniliforme Negative    44. Aureobasidium pullulans (pullulara) Negative    45. Rhizopus oryzae Negative    46. Botrytis cinera Negative    47. Epicoccum nigrum Negative    48. Phoma betae Negative    49. Candida Albicans Negative    50. Trichophyton mentagrophytes Negative    51. Mite, D Farinae  5,000 AU/ml Negative    52. Mite, D Pteronyssinus  5,000 AU/ml Negative    53. Cat Hair 10,000 BAU/ml Negative    54.  Dog Epithelia Negative    55. Mixed Feathers Negative    56. Horse Epithelia Negative    57. Cockroach, German Negative    58. Mouse Negative    59. Tobacco Leaf Negative             Intradermal - 10/21/21 1648     Time Antigen Placed 1615    Allergen Manufacturer Lavella Hammock    Location Arm    Number of Test 15    Intradermal Select    Control Negative    [place] Negative    Johnson Negative    7 Grass Negative    Ragweed mix Negative    Weed mix Negative    Tree mix Negative    Mold 1 Negative    Mold 2 1+    Mold 3 Negative    Mold 4 Negative    Cat Negative    Dog Negative    Cockroach Negative    Mite mix Negative             Food Adult Perc - 10/21/21 1500     Time Antigen Placed 1546    Allergen Manufacturer Greer    Location Back    Number of allergen test 34    1. Peanut Negative    2. Soybean Negative    3. Wheat Negative    4. Sesame Negative    5. Milk, cow Negative    6. Egg White, Chicken Negative  7. Casein Negative    8. Shellfish Mix Negative    9. Fish Mix Negative    10.  Cashew Negative    11. Pecan Food Negative    12. [place] Negative    13. Almond Negative    14. Hazelnut Negative    15. [place] nut Negative    16. Coconut Negative    17. Pistachio Negative    32. Rye  Negative    37. Pork Negative    38. [place] Meat Negative    39. Chicken Meat Negative    40. Beef Negative    42. Tomato Negative    43. White Potato Negative    44. Sweet Potato Negative    48. Avocado Negative    49. Onion Negative    53. Corn Negative    56. Orange  Negative    59. Peach Negative    60. Strawberry Negative    63. Pineapple Negative    64. Chocolate/Cacao bean Negative    70. Garlic Negative             Allergy testing results were read and interpreted by myself, documented by clinical staff.\         Salvatore Marvel, MD [facility]

## 2021-10-21 NOTE — Patient Instructions (Addendum)
1. Perennial allergic rhinitis - Testing today showed: indoor molds. - Copy of test results provided. - Avoidance measures provided. - Continue with: Zyrtec (cetirizine) 10mg  tablet twice daily and Pepcid (famotidine) 40mg  twice daily - Start taking: Singulair (montelukast) 10mg  daily and Cromolyn 200mg  four times daily (30 min before meals).  - You can use an extra dose of the antihistamine, if needed, for breakthrough symptoms.   2. Allergic reaction - unknown trigger - Testing to all of the selected foods was negative. - There is a the low positive predictive value of food allergy testing and hence the high possibility of false positives. - In contrast, food allergy testing has a high negative predictive value, therefore if testing is negative we can be relatively assured that they are indeed negative.  - We are going to tentatively treat this as a mast cell mediated disease, although I am not sure what is going on with you. - I do want to do labs when you are able to get those done, but call us when you can step foot back into [facility].  3. Return in about 2 months (around 12/22/2021).    Please inform us of any Emergency Department visits, hospitalizations, or changes in symptoms. Call us before going to the ED for breathing or allergy symptoms since we might be able to fit you in for a sick visit. Feel free to contact us anytime with any questions, problems, or concerns.  It was a pleasure to meet you today!  Websites that have reliable patient information: 1. American Academy of Asthma, Allergy, and Immunology: www.aaaai.org 2. Food Allergy Research and Education (FARE): foodallergy.org 3. Mothers of Asthmatics: http://www.asthmacommunitynetwork.org 4. American College of Allergy, Asthma, and Immunology: www.acaai.org   COVID-19 Vaccine Information can be found at: [email or web address].[email or web address].uk For questions related to vaccine  distribution or appointments, please email [email or web address] or call [phone].   We realize that you might be concerned about having an allergic reaction to the COVID19 vaccines. To help with that concern, WE ARE OFFERING THE COVID19 VACCINES IN OUR OFFICE! Ask the front desk for dates!     Like [place] on [employer] [place] and Instagram for our latest updates!      A healthy democracy works best when [employer] participate! Make sure you are registered to vote! If you have moved or changed any of your contact information, you will need to get this updated before voting!  In some cases, you MAY be able to register to vote online: [email or web address]     Airborne Adult Perc - 10/21/21 1545     Time Antigen Placed [place]    Location Back    Number of Test 59    Panel 1 Select    1. Control-Buffer 50% Glycerol Negative    2. Control-Histamine 1 mg/ml 2+    3. Albumin saline Negative    4. [place] Negative    5. [place] Negative    6. Johnson Negative    7. [place] Negative    8. Meadow Fescue Negative    9. Perennial Rye Negative    10. Sweet Vernal Negative    11. Timothy Negative    12. Cocklebur Negative    13. Burweed Marshelder Negative    14. Ragweed, short Negative    15. Ragweed, Giant Negative    16. Plantain,  English Negative    17. Lamb's Quarters Negative    18. Sheep Sorrell Negative    19. Rough Pigweed Negative  [place], Rough Negative    21. Mugwort, Common Negative    22. Ash mix Negative    23. Birch mix Negative    24. Beech American Negative    25. Box, Elder Negative    26. Cedar, red Negative    27. Cottonwood, [place] Negative    28. Elm mix Negative    29. Hickory Negative    30. Maple mix Negative    31. Oak, [place] mix Negative    32. Pecan Pollen Negative    33. Pine mix Negative    34. Sycamore Eastern Negative    35. [place], Black Pollen Negative    36. Alternaria  alternata Negative    37. Cladosporium Herbarum Negative    38. Aspergillus mix Negative    39. Penicillium mix Negative    40. Bipolaris sorokiniana (Helminthosporium) Negative    41. Drechslera spicifera (Curvularia) Negative    42. Mucor plumbeus Negative    43. Fusarium moniliforme Negative    44. Aureobasidium pullulans (pullulara) Negative    45. Rhizopus oryzae Negative    46. Botrytis cinera Negative    47. Epicoccum nigrum Negative    48. Phoma betae Negative    49. Candida Albicans Negative    50. Trichophyton mentagrophytes Negative    51. Mite, D Farinae  5,000 AU/ml Negative    52. Mite, D Pteronyssinus  5,000 AU/ml Negative    53. Cat Hair 10,000 BAU/ml Negative    54.  Dog Epithelia Negative    55. Mixed Feathers Negative    56. Horse Epithelia Negative    57. Cockroach, German Negative    58. Mouse Negative    59. Tobacco Leaf Negative             Intradermal - 10/21/21 1648     Time Antigen Placed 1615    Allergen Manufacturer Lavella Hammock    Location Arm    Number of Test 15    Intradermal Select    Control Negative    [place] Negative    Johnson Negative    7 Grass Negative    Ragweed mix Negative    Weed mix Negative    Tree mix Negative    Mold 1 Negative    Mold 2 1+    Mold 3 Negative    Mold 4 Negative    Cat Negative    Dog Negative    Cockroach Negative    Mite mix Negative             Food Adult Perc - 10/21/21 1500     Time Antigen Placed 1546    Allergen Manufacturer Greer    Location Back    Number of allergen test 34    1. Peanut Negative    2. Soybean Negative    3. Wheat Negative    4. Sesame Negative    5. Milk, cow Negative    6. Egg White, Chicken Negative    7. Casein Negative    8. Shellfish Mix Negative    9. Fish Mix Negative    10. Cashew Negative    11. Pecan Food Negative    12. [employer] Negative    13. Almond Negative    14. Hazelnut Negative    15. [place] nut Negative    16. Coconut Negative     17. Pistachio Negative    32. Rye  Negative    37. Pork Negative    38. [place] Meat Negative  39. Chicken Meat Negative    40. Beef Negative    42. Tomato Negative    43. White Potato Negative    44. Sweet Potato Negative    48. Avocado Negative    49. Onion Negative    53. Corn Negative    56. Orange  Negative    59. Peach Negative    60. Strawberry Negative    63. Pineapple Negative    64. Chocolate/Cacao bean Negative    70. Garlic Negative              Control of Mold Allergen   Mold and fungi can grow on a variety of surfaces provided certain temperature and moisture conditions exist.  Outdoor molds grow on plants, decaying vegetation and soil.  The major outdoor mold, Alternaria and Cladosporium, are found in very high numbers during hot and dry conditions.  Generally, a late Summer - Fall peak is seen for common outdoor fungal spores.  Rain will temporarily lower outdoor mold spore count, but counts rise rapidly when the rainy period ends.  The most important indoor molds are Aspergillus and Penicillium.  Dark, humid and poorly ventilated basements are ideal sites for mold growth.  The next most common sites of mold growth are the bathroom and the kitchen.   Indoor (Perennial) Mold Control   Positive indoor molds via skin testing: Aspergillus and Penicillium  Maintain humidity below 50%. Clean washable surfaces with 5% bleach solution. Remove sources e.g. contaminated carpets.

## 2021-10-27 ENCOUNTER — Ambulatory Visit: Payer: 59 | Admitting: Family Medicine

## 2021-10-27 IMAGING — US US PELVIS COMPLETE WITH TRANSVAGINAL
1 series · 13 of 25 positions shown · non-contrast
Comparison: 07/19/2018

CLINICAL DATA: Postmenopausal bleeding for 2 weeks, LMP 1609



[Series 1: us pelvic complete with transvaginal · 13 of 57 slices shown]
[im 1/57]
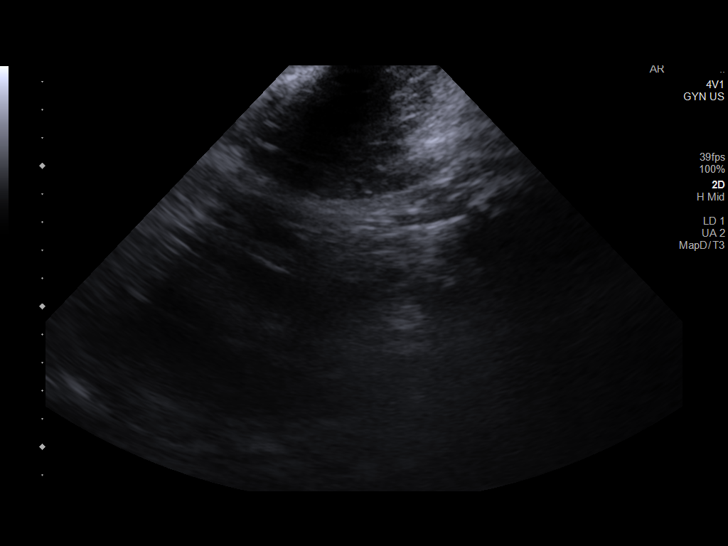
[im 5/57]
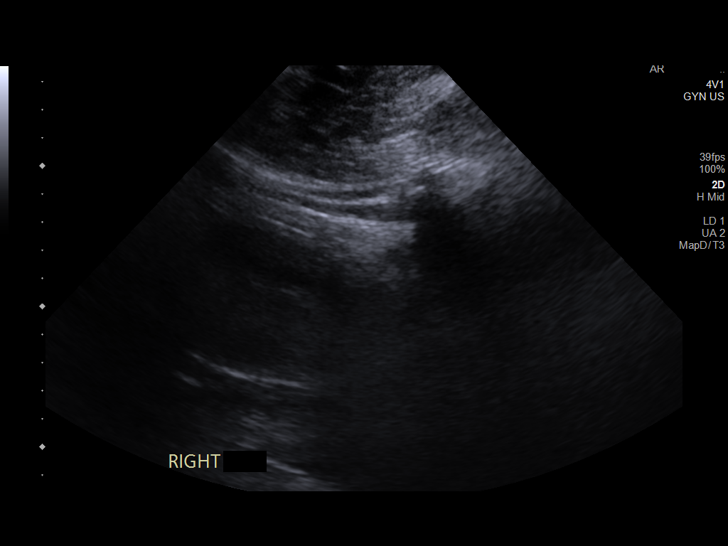
[im 10/57]
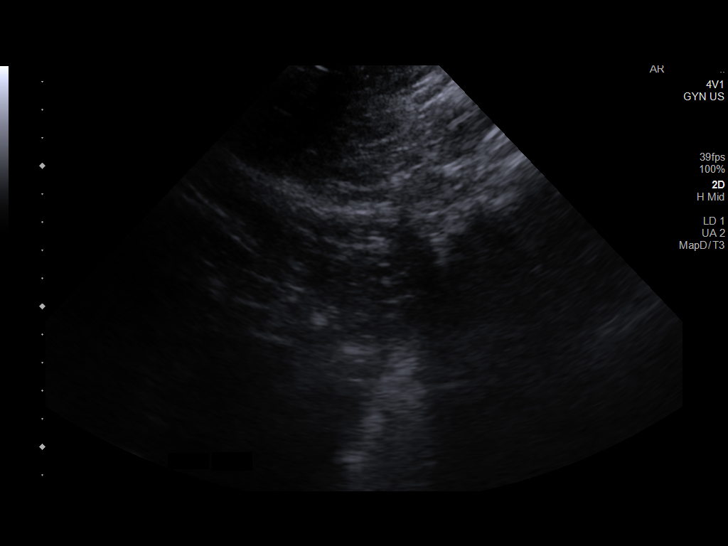
[im 15/57]
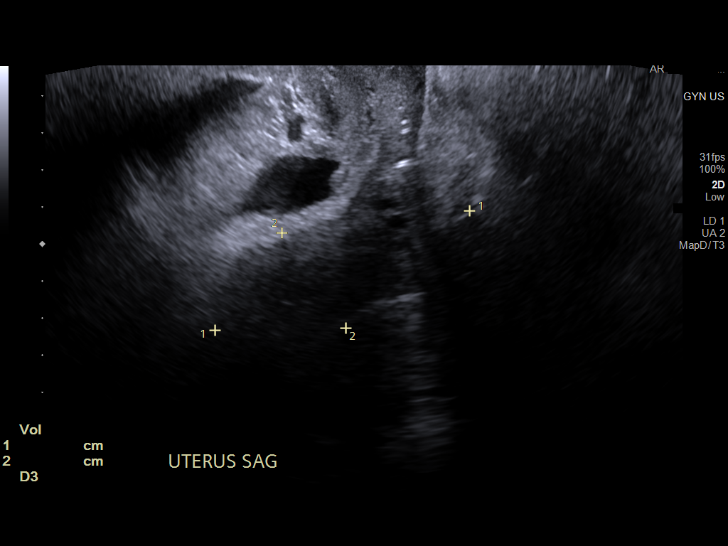
[im 19/57]
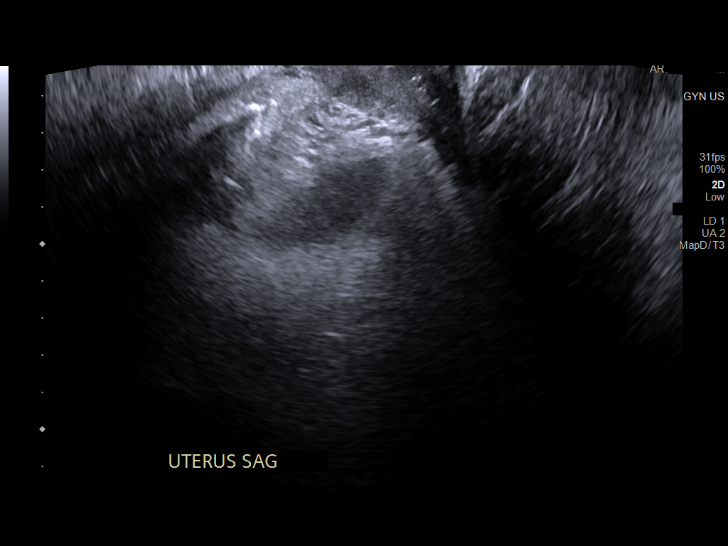
[im 24/57]
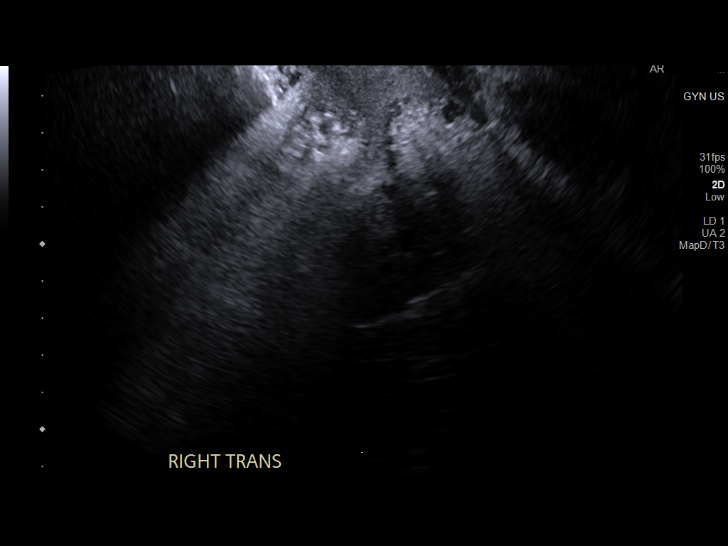
[im 29/57]
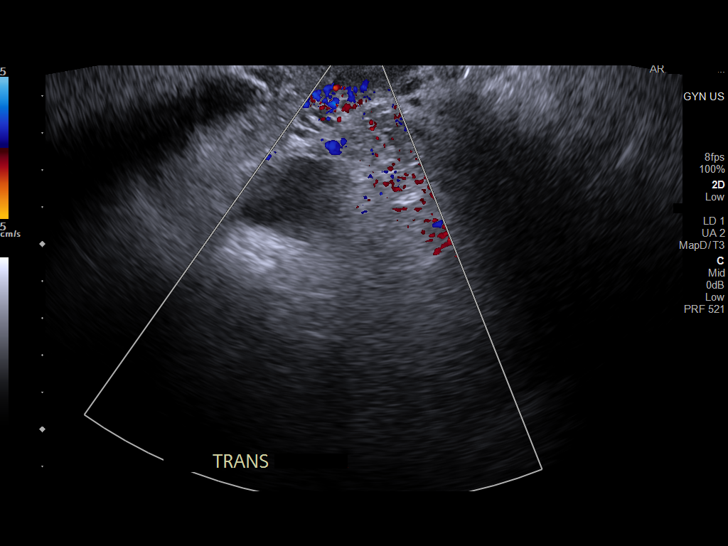
[im 33/57]
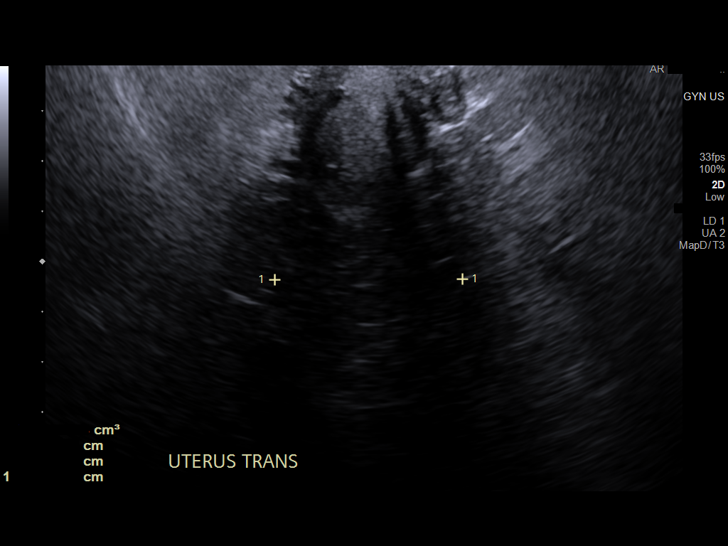
[im 38/57]
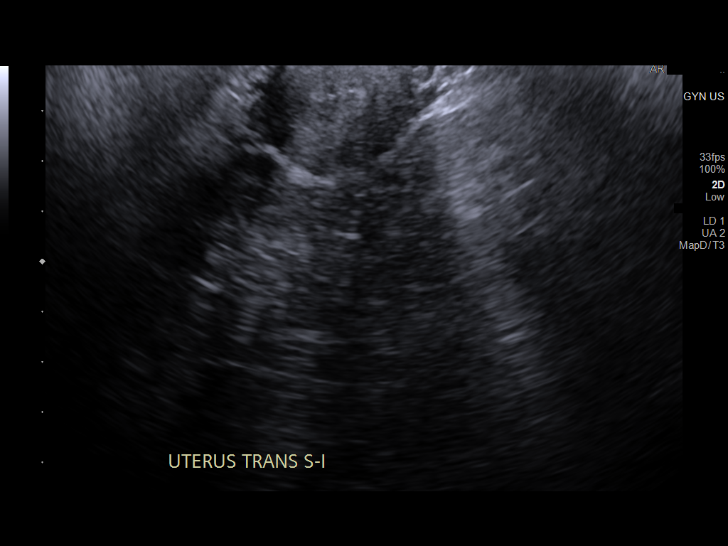
[im 43/57]
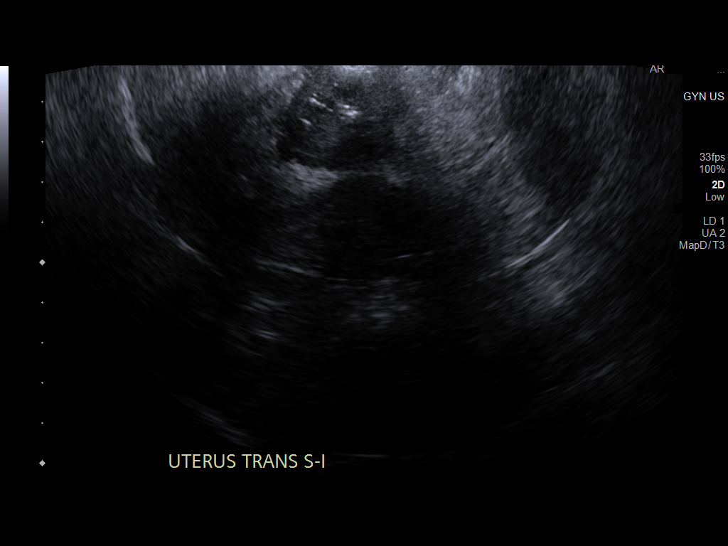
[im 47/57]
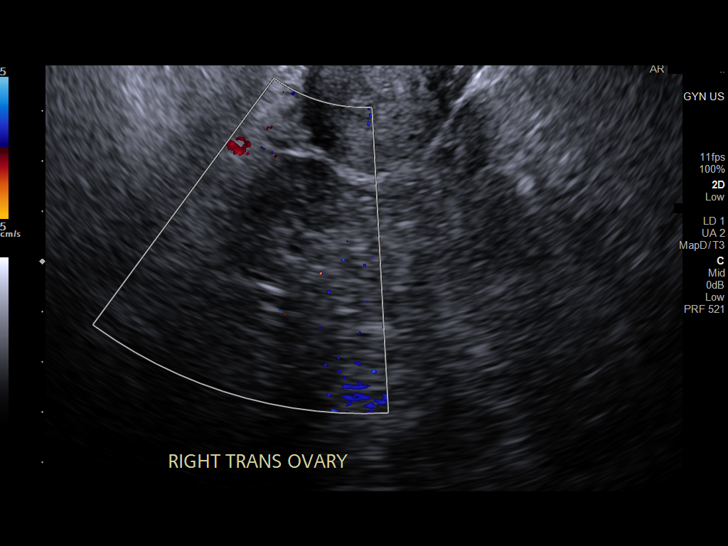
[im 52/57]
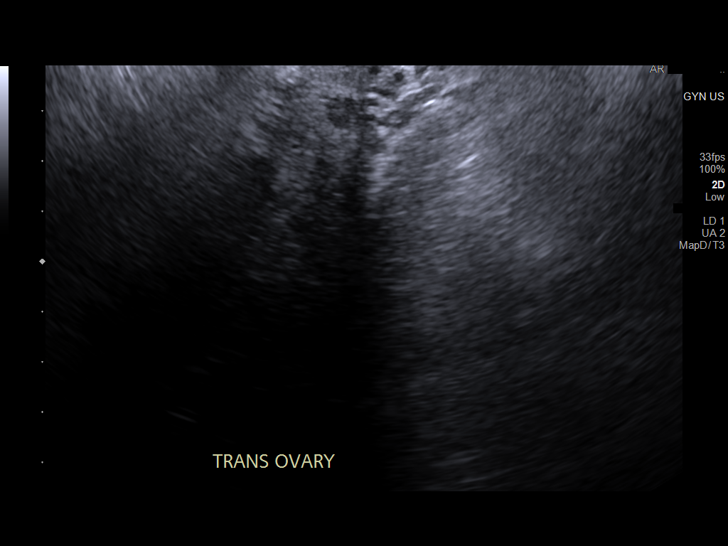
[im 57/57]
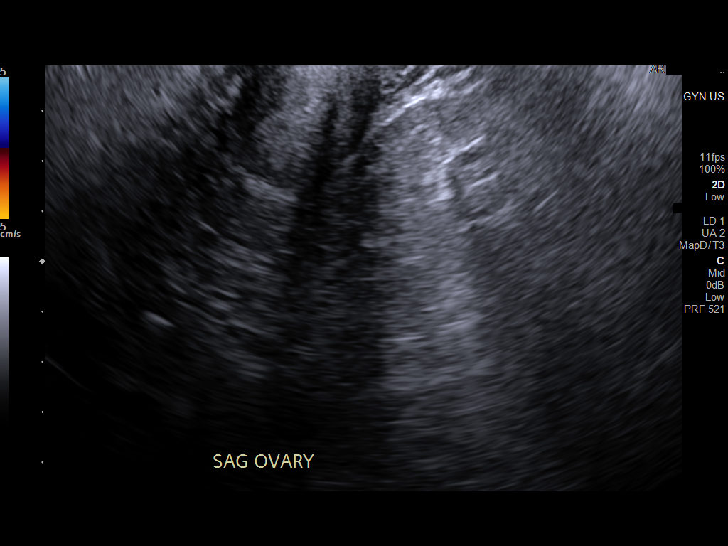

[13 of 25 positions shown; findings below may reference images not displayed]

FINDINGS: Uterus

Measurements: 7.6 x 3.1 x 3.7 cm = volume: 46 mL. Anteverted. Poorly
visualized due to poor acoustic window/inadequate bladder distention
on transabdominal imaging and by shadowing on transvaginal imaging.
No gross mass identified on limited assessment

Endometrium

Inadequately visualized for accurate measurement

Right ovary

Measurements: 2.0 x 2.1 x 2.0 cm = volume: 4.4 mL. Poorly seen due
to bowel. Grossly normal morphology without mass

Left ovary

Measurements: 1.7 x 2.6 x 2.1 cm = volume: 4.8 mL. Poorly seen due
to bowel. Grossly normal morphology without mass.

Other findings

No free pelvic fluid.  No adnexal masses.
IMPRESSION: Suboptimal visualization of uterus and ovaries as above.

No gross pelvic sonographic abnormalities identified.

However, in the setting of postmenopausal bleeding and failure to
adequately visualize the endometrial complex, endometrial sampling
is indicated to exclude carcinoma. If results are benign,
sonohysterogram should be considered for focal lesion work-up. (Ref:
Radiological Reasoning: Algorithmic Workup of Abnormal Vaginal
Bleeding with Endovaginal Sonography and Sonohysterography. AJR
[id number]; 191:S68-73)

## 2021-10-28 ENCOUNTER — Encounter: Payer: Self-pay | Admitting: Allergy & Immunology

## 2021-11-03 ENCOUNTER — Telehealth: Payer: Self-pay | Admitting: Family Medicine

## 2021-11-03 NOTE — Telephone Encounter (Signed)
Does this letter also have to include having a dog or a animal for comfort?  Or is it just purely on disability?

## 2021-11-03 NOTE — Telephone Encounter (Signed)
Patient is requesting a letter also for housing  stating she is disable and describing her disability on letter head.please advise

## 2021-11-04 NOTE — Telephone Encounter (Signed)
Left message to return call 

## 2021-11-04 NOTE — Telephone Encounter (Signed)
Does not need to include pet information Patient states they are applying for low income apartments and where they were denied for disability they meed a typed letter on letterhead stating they are disabled and they can not do stairs and such due to disability

## 2021-11-07 ENCOUNTER — Encounter: Payer: Self-pay | Admitting: Family Medicine

## 2021-11-07 NOTE — Telephone Encounter (Signed)
Letter was dictated please provide to patient thank you-I can sign it

## 2021-11-16 ENCOUNTER — Ambulatory Visit (INDEPENDENT_AMBULATORY_CARE_PROVIDER_SITE_OTHER): Payer: 59 | Admitting: Nurse Practitioner

## 2021-11-16 ENCOUNTER — Other Ambulatory Visit: Payer: Self-pay

## 2021-11-16 ENCOUNTER — Encounter: Payer: Self-pay | Admitting: Nurse Practitioner

## 2021-11-16 VITALS — BP 138/92 | HR 83 | Temp 97.7°F | Wt 303.4 lb

## 2021-11-16 DIAGNOSIS — M545 Low back pain, unspecified: Secondary | ICD-10-CM | POA: Diagnosis not present

## 2021-11-16 MED ORDER — CYCLOBENZAPRINE HCL 5 MG PO TABS: 5.0000 mg | ORAL_TABLET | Freq: Three times a day (TID) | ORAL | 1 refills | Status: DC | PRN

## 2021-11-16 NOTE — Progress Notes (Signed)
° °  Subjective:    Patient ID: Summer [name], female    DOB: 05/21/1961, 61 y.o.   MRN: [id number]  HPI Pt here for lower back pain. Patient describes lower back as back spasms felt to the right side of her back x 3 weeks. Patient states that she would feel 15-20 spasms a day that would be relieved by stretching her back.  Patient denies injury. Patient did admit to increasing her walking prior to the start of her back spasms, however patient unsure if walking caused the back spasm because she had been walking for months prior to. Patient has been taking magnesium and potassium to hopefully improve the pain. Patient also states that she helped her neighbor clean his apartment and she noticed that the back spasms had gotten better. Patient states that pain is starting to get better now but still wants to know why they are happening.    Review of Systems  Musculoskeletal:  Positive for back pain.  All other systems reviewed and are negative.     Objective:   Physical Exam Constitutional:      Appearance: Normal appearance. She is obese.  Cardiovascular:     Rate and Rhythm: Normal rate and regular rhythm.     Pulses: Normal pulses.     Heart sounds: Normal heart sounds. No murmur heard. Pulmonary:     Effort: Pulmonary effort is normal. No respiratory distress.     Breath sounds: Normal breath sounds. No wheezing.  Musculoskeletal:     Lumbar back: Tenderness present. No swelling, edema, deformity, signs of trauma, lacerations, spasms or bony tenderness. Decreased range of motion. Negative right straight leg raise test and negative left straight leg raise test. No scoliosis.     Comments: - Point tenderness to left and right lower lumbar regions - Tenderness along length of spine. - Negative Left and right straight leg raises. However, pain to lower back elicited when raising legs but no shooting pain down the legs. - Decreased range of motion due to habitus   Skin:    General: Skin is  warm.  Neurological:     General: No focal deficit present.     Mental Status: She is alert and oriented to person, place, and time.  Psychiatric:        Mood and Affect: Mood normal.        Behavior: Behavior normal.          Assessment & Plan:   1. Acute right-sided low back pain without sciatica - Likely Myofascial pain. Arthritic changes possible.  - Sciatica not likely at this time due to negative straight leg tests.  - Ambulatory referral to Physical Therapy - Flexeril 5mg  daily ordered - S/e of flexeril discussed. Medication may make you fatigued. Do not take with Xanax.  - May take OTC tylenol 325mg  BID PRN.  - Ibuprofen avoided due to hx of GERD - Unable to get recent labs due to patient's outstanding lab bill. Patient is working to get bill paid off as soon as possible.  - RTC if back pain not improved or worse - RTC to reschedule BP check up

## 2021-11-18 ENCOUNTER — Encounter (INDEPENDENT_AMBULATORY_CARE_PROVIDER_SITE_OTHER): Payer: Self-pay

## 2021-11-18 ENCOUNTER — Telehealth (INDEPENDENT_AMBULATORY_CARE_PROVIDER_SITE_OTHER): Payer: Self-pay

## 2021-11-18 ENCOUNTER — Other Ambulatory Visit (INDEPENDENT_AMBULATORY_CARE_PROVIDER_SITE_OTHER): Payer: Self-pay

## 2021-11-18 DIAGNOSIS — I10 Essential (primary) hypertension: Secondary | ICD-10-CM

## 2021-11-18 DIAGNOSIS — K219 Gastro-esophageal reflux disease without esophagitis: Secondary | ICD-10-CM

## 2021-11-18 DIAGNOSIS — Z1211 Encounter for screening for malignant neoplasm of colon: Secondary | ICD-10-CM

## 2021-11-18 NOTE — Telephone Encounter (Signed)
Rasheen Schewe Ann Aubriauna Riner, CMA  ?

## 2021-11-19 ENCOUNTER — Other Ambulatory Visit (INDEPENDENT_AMBULATORY_CARE_PROVIDER_SITE_OTHER): Payer: Self-pay

## 2021-11-19 NOTE — Patient Instructions (Signed)
Summer [name]  11/19/2021     @PREFPERIOPPHARMACY @   Your procedure is scheduled on  11/24/2021.   Report to [facility] at  1230 P.M.   Call this number if you have problems the morning of surgery:  [phone]   Remember:  Follow the diet and prep instructions given to you by the office.    Use your nebulizer and your inhalers before you come and bring your rescue inhaler with you.    Take these medicines the morning of surgery with A SIP OF WATER        xanax(if needed), zyrtec, gastrocran, flexeril(if needed), pepcid, hydrococone(if needed), thyroid     Do not wear jewelry, make-up or nail polish.  Do not wear lotions, powders, or perfumes, or deodorant.  Do not shave 48 hours prior to surgery.  Men may shave face and neck.  Do not bring valuables to the hospital.  [facility] is not responsible for any belongings or valuables.  Contacts, dentures or bridgework may not be worn into surgery.  Leave your suitcase in the car.  After surgery it may be brought to your room.  For patients admitted to the hospital, discharge time will be determined by your treatment team.  Patients discharged the day of surgery will not be allowed to drive home and must have someone with them for 24 hours.    Special instructions:   DO NOT smoke tobacco or vape for 24 hours before your procedure.  Please read over the following fact sheets that you were given. Anesthesia Post-op Instructions and Care and Recovery After Surgery      Upper Endoscopy, Adult, Care After This sheet gives you information about how to care for yourself after your procedure. Your health care provider may also give you more specific instructions. If you have problems or questions, contact your health care provider. What can I expect after the procedure? After the procedure, it is common to have: A sore throat. Mild stomach pain or discomfort. Bloating. Nausea. Follow these instructions at  home:  Follow instructions from your health care provider about what to eat or drink after your procedure. Return to your normal activities as told by your health care provider. Ask your health care provider what activities are safe for you. Take over-the-counter and prescription medicines only as told by your health care provider. If you were given a sedative during the procedure, it can affect you for several hours. Do not drive or operate machinery until your health care provider says that it is safe. Keep all follow-up visits as told by your health care provider. This is important. Contact a health care provider if you have: A sore throat that lasts longer than one day. Trouble swallowing. Get help right away if: You vomit blood or your vomit looks like coffee grounds. You have: A fever. Bloody, black, or tarry stools. A severe sore throat or you cannot swallow. Difficulty breathing. Severe pain in your chest or abdomen. Summary After the procedure, it is common to have a sore throat, mild stomach discomfort, bloating, and nausea. If you were given a sedative during the procedure, it can affect you for several hours. Do not drive or operate machinery until your health care provider says that it is safe. Follow instructions from your health care provider about what to eat or drink after your procedure. Return to your normal activities as told by your health care provider. This information is  not intended to replace advice given to you by your health care provider. Make sure you discuss any questions you have with your health care provider. Document Revised: 08/24/2019 Document Reviewed: 03/20/2018 Elsevier Patient Education  2022 [place]. Colonoscopy, Adult, Care After This sheet gives you information about how to care for yourself after your procedure. Your health care provider may also give you more specific instructions. If you have problems or questions, contact your health  care provider. What can I expect after the procedure? After the procedure, it is common to have: A small amount of blood in your stool for 24 hours after the procedure. Some gas. Mild cramping or bloating of your abdomen. Follow these instructions at home: Eating and drinking  Drink enough fluid to keep your urine pale yellow. Follow instructions from your health care provider about eating or drinking restrictions. Resume your normal diet as instructed by your health care provider. Avoid heavy or fried foods that are hard to digest. Activity Rest as told by your health care provider. Avoid sitting for a long time without moving. Get up to take short walks every 1-2 hours. This is important to improve blood flow and breathing. Ask for help if you feel weak or unsteady. Return to your normal activities as told by your health care provider. Ask your health care provider what activities are safe for you. Managing cramping and bloating  Try walking around when you have cramps or feel bloated. Apply heat to your abdomen as told by your health care provider. Use the heat source that your health care provider recommends, such as a moist heat pack or a heating pad. Place a towel between your skin and the heat source. Leave the heat on for 20-30 minutes. Remove the heat if your skin turns bright red. This is especially important if you are unable to feel pain, heat, or cold. You may have a greater risk of getting burned. General instructions If you were given a sedative during the procedure, it can affect you for several hours. Do not drive or operate machinery until your health care provider says that it is safe. For the first 24 hours after the procedure: Do not sign important documents. Do not drink alcohol. Do your regular daily activities at a slower pace than normal. Eat soft foods that are easy to digest. Take over-the-counter and prescription medicines only as told by your health care  provider. Keep all follow-up visits as told by your health care provider. This is important. Contact a health care provider if: You have blood in your stool 2-3 days after the procedure. Get help right away if you have: More than a small spotting of blood in your stool. Large blood clots in your stool. Swelling of your abdomen. Nausea or vomiting. A fever. Increasing pain in your abdomen that is not relieved with medicine. Summary After the procedure, it is common to have a small amount of blood in your stool. You may also have mild cramping and bloating of your abdomen. If you were given a sedative during the procedure, it can affect you for several hours. Do not drive or operate machinery until your health care provider says that it is safe. Get help right away if you have a lot of blood in your stool, nausea or vomiting, a fever, or increased pain in your abdomen. This information is not intended to replace advice given to you by your health care provider. Make sure you discuss any questions you have with  your health care provider. Document Revised: 08/24/2019 Document Reviewed: 05/14/2019 Elsevier Patient Education  [place] After This sheet gives you information about how to care for yourself after your procedure. Your health care provider may also give you more specific instructions. If you have problems or questions, contact your health care provider. What can I expect after the procedure? After the procedure, it is common to have: Tiredness. Forgetfulness about what happened after the procedure. Impaired judgment for important decisions. Nausea or vomiting. Some difficulty with balance. Follow these instructions at home: For the time period you were told by your health care provider:   Rest as needed. Do not participate in activities where you could fall or become injured. Do not drive or use machinery. Do not drink alcohol. Do not  take sleeping pills or medicines that cause drowsiness. Do not make important decisions or sign legal documents. Do not take care of children on your own. Eating and drinking Follow the diet that is recommended by your health care provider. Drink enough fluid to keep your urine pale yellow. If you vomit: Drink water, juice, or soup when you can drink without vomiting. Make sure you have little or no nausea before eating solid foods. General instructions Have a responsible adult stay with you for the time you are told. It is important to have someone help care for you until you are awake and alert. Take over-the-counter and prescription medicines only as told by your health care provider. If you have sleep apnea, surgery and certain medicines can increase your risk for breathing problems. Follow instructions from your health care provider about wearing your sleep device: Anytime you are sleeping, including during daytime naps. While taking prescription pain medicines, sleeping medicines, or medicines that make you drowsy. Avoid smoking. Keep all follow-up visits as told by your health care provider. This is important. Contact a health care provider if: You keep feeling nauseous or you keep vomiting. You feel light-headed. You are still sleepy or having trouble with balance after 24 hours. You develop a rash. You have a fever. You have redness or swelling around the IV site. Get help right away if: You have trouble breathing. You have new-onset confusion at home. Summary For several hours after your procedure, you may feel tired. You may also be forgetful and have poor judgment. Have a responsible adult stay with you for the time you are told. It is important to have someone help care for you until you are awake and alert. Rest as told. Do not drive or operate machinery. Do not drink alcohol or take sleeping pills. Get help right away if you have trouble breathing, or if you suddenly  become confused. This information is not intended to replace advice given to you by your health care provider. Make sure you discuss any questions you have with your health care provider. Document Revised: 07/03/2020 Document Reviewed: 09/20/2019 Elsevier Patient Education  2022 [employer].

## 2021-11-20 ENCOUNTER — Other Ambulatory Visit: Payer: Self-pay

## 2021-11-20 ENCOUNTER — Encounter (HOSPITAL_COMMUNITY): Payer: Self-pay

## 2021-11-20 ENCOUNTER — Encounter (HOSPITAL_COMMUNITY)
Admission: RE | Admit: 2021-11-20 | Discharge: 2021-11-20 | Disposition: A | Discharge status: Home / Self Care | Payer: 59 | Source: Ambulatory Visit | Attending: Gastroenterology | Admitting: Gastroenterology

## 2021-11-20 DIAGNOSIS — Z01812 Encounter for preprocedural laboratory examination: Secondary | ICD-10-CM | POA: Diagnosis not present

## 2021-11-20 DIAGNOSIS — K219 Gastro-esophageal reflux disease without esophagitis: Secondary | ICD-10-CM | POA: Insufficient documentation

## 2021-11-20 DIAGNOSIS — Z1211 Encounter for screening for malignant neoplasm of colon: Secondary | ICD-10-CM

## 2021-11-20 DIAGNOSIS — I10 Essential (primary) hypertension: Secondary | ICD-10-CM | POA: Insufficient documentation

## 2021-11-20 LAB — BASIC METABOLIC PANEL
Anion gap: 9 (ref 5–15)
BUN: 14 mg/dL (ref 6–20)
CO2: 26 mmol/L (ref 22–32)
Calcium: 9.2 mg/dL (ref 8.9–10.3)
Chloride: 106 mmol/L (ref 98–111)
Creatinine, Ser: 0.66 mg/dL (ref 0.44–1.00)
GFR, Estimated: >60 mL/min (ref >60)
Glucose, Bld: 113 mg/dL — ABNORMAL HIGH (ref 70–99)
Potassium: 3.9 mmol/L (ref 3.5–5.1)
Sodium: 141 mmol/L (ref 135–145)

## 2021-11-24 ENCOUNTER — Encounter (HOSPITAL_COMMUNITY)
Admission: RE | Disposition: A | Discharge status: Home / Self Care | Payer: Self-pay | Source: Home / Self Care | Attending: Gastroenterology

## 2021-11-24 ENCOUNTER — Encounter (HOSPITAL_COMMUNITY): Payer: Self-pay | Admitting: Gastroenterology

## 2021-11-24 ENCOUNTER — Ambulatory Visit (HOSPITAL_COMMUNITY)
Admission: RE | Admit: 2021-11-24 | Discharge: 2021-11-24 | Disposition: A | Discharge status: Home / Self Care | Payer: 59 | Attending: Gastroenterology | Admitting: Gastroenterology

## 2021-11-24 ENCOUNTER — Ambulatory Visit (HOSPITAL_COMMUNITY): Payer: 59 | Admitting: Anesthesiology

## 2021-11-24 DIAGNOSIS — D123 Benign neoplasm of transverse colon: Secondary | ICD-10-CM | POA: Insufficient documentation

## 2021-11-24 DIAGNOSIS — K219 Gastro-esophageal reflux disease without esophagitis: Secondary | ICD-10-CM | POA: Insufficient documentation

## 2021-11-24 DIAGNOSIS — Z87891 Personal history of nicotine dependence: Secondary | ICD-10-CM | POA: Diagnosis not present

## 2021-11-24 DIAGNOSIS — D122 Benign neoplasm of ascending colon: Secondary | ICD-10-CM | POA: Insufficient documentation

## 2021-11-24 DIAGNOSIS — K648 Other hemorrhoids: Secondary | ICD-10-CM | POA: Diagnosis not present

## 2021-11-24 DIAGNOSIS — K529 Noninfective gastroenteritis and colitis, unspecified: Secondary | ICD-10-CM | POA: Diagnosis present

## 2021-11-24 DIAGNOSIS — K449 Diaphragmatic hernia without obstruction or gangrene: Secondary | ICD-10-CM | POA: Diagnosis not present

## 2021-11-24 DIAGNOSIS — K621 Rectal polyp: Secondary | ICD-10-CM

## 2021-11-24 DIAGNOSIS — I509 Heart failure, unspecified: Secondary | ICD-10-CM | POA: Insufficient documentation

## 2021-11-24 DIAGNOSIS — D125 Benign neoplasm of sigmoid colon: Secondary | ICD-10-CM | POA: Diagnosis not present

## 2021-11-24 DIAGNOSIS — K635 Polyp of colon: Secondary | ICD-10-CM | POA: Diagnosis not present

## 2021-11-24 DIAGNOSIS — M797 Fibromyalgia: Secondary | ICD-10-CM | POA: Insufficient documentation

## 2021-11-24 DIAGNOSIS — I110 Hypertensive heart disease with heart failure: Secondary | ICD-10-CM | POA: Insufficient documentation

## 2021-11-24 DIAGNOSIS — Z79899 Other long term (current) drug therapy: Secondary | ICD-10-CM | POA: Insufficient documentation

## 2021-11-24 DIAGNOSIS — D175 Benign lipomatous neoplasm of intra-abdominal organs: Secondary | ICD-10-CM | POA: Diagnosis not present

## 2021-11-24 DIAGNOSIS — E063 Autoimmune thyroiditis: Secondary | ICD-10-CM | POA: Insufficient documentation

## 2021-11-24 DIAGNOSIS — I10 Essential (primary) hypertension: Secondary | ICD-10-CM

## 2021-11-24 DIAGNOSIS — Z79890 Hormone replacement therapy (postmenopausal): Secondary | ICD-10-CM | POA: Diagnosis not present

## 2021-11-24 DIAGNOSIS — Z1211 Encounter for screening for malignant neoplasm of colon: Secondary | ICD-10-CM

## 2021-11-24 HISTORY — PX: BIOPSY: SHX5522

## 2021-11-24 HISTORY — PX: POLYPECTOMY: SHX5525

## 2021-11-24 HISTORY — PX: ESOPHAGOGASTRODUODENOSCOPY (EGD) WITH PROPOFOL: SHX5813

## 2021-11-24 HISTORY — PX: COLONOSCOPY WITH PROPOFOL: SHX5780

## 2021-11-24 LAB — HM COLONOSCOPY

## 2021-11-24 SURGERY — COLONOSCOPY WITH PROPOFOL
Anesthesia: General

## 2021-11-24 MED ORDER — PROPOFOL 500 MG/50ML IV EMUL
INTRAVENOUS | Status: DC | PRN
  Administered 2021-11-24: 100 ug/kg/min via INTRAVENOUS
  Administered 2021-11-24: 75 ug/kg/min via INTRAVENOUS

## 2021-11-24 MED ORDER — LACTATED RINGERS IV SOLN: INTRAVENOUS | Status: DC

## 2021-11-24 MED ORDER — SODIUM CHLORIDE 0.9 % IV SOLN: INTRAVENOUS | Status: DC

## 2021-11-24 MED ORDER — PROPOFOL 10 MG/ML IV BOLUS
INTRAVENOUS | Status: DC | PRN
  Administered 2021-11-24: 100 mg via INTRAVENOUS
  Administered 2021-11-24: 150 mg via INTRAVENOUS
  Administered 2021-11-24 (×2): 100 mg via INTRAVENOUS
  Administered 2021-11-24: 75 mg via INTRAVENOUS

## 2021-11-24 MED ORDER — LIDOCAINE HCL (CARDIAC) PF 50 MG/5ML IV SOSY
PREFILLED_SYRINGE | INTRAVENOUS | Status: DC | PRN
Start: 2021-11-24 — End: 2021-11-24
  Administered 2021-11-24: 50 mg via INTRAVENOUS

## 2021-11-24 NOTE — H&P (Addendum)
Summer [name] is an 61 y.o. female.   Chief Complaint: reflux episodes and surveillance colonoscopy. HPI: Summer [name] is a 61 y.o. female with past medical history of heart failure (normal EF July 2022), Fibromyalgia, Bell's palsy, hypothyroidism, HTN coming for evaluation of reflux episodes and surveillance colonoscopy, diarrhea.  Patient has presented recurrent reflux despite taking Pepcid 20 mg twice daily.  She is taking Carafate 1 g every day.  Past Medical History:  Diagnosis Date   Arthritis    Bell's palsy    CHF (congestive heart failure) (HCC)    Dysrhythmia, cardiac    Fibromyalgia    Hashimoto's disease    History of CT scan 06/2017   "coronary CT scan on 06/01/2017, this did not find any cardiac calcifications. Coronary calcium score of zero."   Hypertension    Thyroid disease    Urticaria     Past Surgical History:  Procedure Laterality Date   COLONOSCOPY N/A 01/23/2014   Procedure: COLONOSCOPY;  Surgeon: Rogene Houston, MD;  Location: [facility];  Service: Endoscopy;  Laterality: N/A;  200   TONSILLECTOMY      Family History  Adopted: Yes  Problem Relation Age of Onset   Cancer Mother        lung   Other Father    Other Daughter        Lyme's disease   Social History:  reports that she quit smoking about 18 years ago. Her smoking use included cigarettes. She has a 20.00 pack-year smoking history. She has never used smokeless tobacco. She reports that she does not drink alcohol and does not use drugs.  Allergies:  Allergies  Allergen Reactions   Molds & Smuts Shortness Of Breath   Avelox [Moxifloxacin Hcl In Nacl] Other (See Comments)    "heart beats weird"   Bee Venom Hives   Shellfish Allergy Hives   Tape Rash    Medications Prior to Admission  Medication Sig Dispense Refill   albuterol (PROVENTIL) (2.5 MG/3ML) 0.083% nebulizer solution Take 3 mLs (2.5 mg total) by nebulization every 4 (four) hours as needed for wheezing. 75 mL 11    ALPRAZolam (XANAX) 0.5 MG tablet 1 bid prn anxiety or insomnia caution drowsiness 30 tablet 2   Ascorbic Acid (VITAMIN C) 500 MG CAPS Take 1 capsule by mouth 2 (two) times daily.      B Complex-C (SUPER B COMPLEX PO) Take by mouth.     cetirizine (ZYRTEC) 10 MG tablet Take 10 mg by mouth 2 (two) times daily.     Cholecalciferol (VITAMIN D-3) 5000 UNITS TABS Take 1 tablet by mouth daily.     cyclobenzaprine (FLEXERIL) 5 MG tablet Take 1 tablet (5 mg total) by mouth 3 (three) times daily as needed for muscle spasms. 30 tablet 1   famotidine (PEPCID) 20 MG tablet Take 20 mg by mouth 2 (two) times daily.     HYDROcodone-acetaminophen (NORCO/VICODIN) 5-325 MG tablet Take 1 tablet by mouth every 6 (six) hours as needed.     Magnesium 200 MG TABS Take 1 tablet by mouth daily.     montelukast (SINGULAIR) 10 MG tablet Take 1 tablet (10 mg total) by mouth at bedtime. 30 tablet 5   OLIVE LEAF PO Take 1 tablet by mouth daily.     OVER THE COUNTER MEDICATION Red root     potassium chloride SA (KLOR-CON) 20 MEQ tablet Take 1 tablet (20 mEq total) by mouth daily. 30 tablet 5  rosuvastatin (CRESTOR) 5 MG tablet Take 1 tablet (5 mg total) by mouth daily. 30 tablet 5   sucralfate (CARAFATE) 1 GM/10ML suspension Take 10 mLs (1 g total) by mouth 4 (four) times daily -  with meals and at bedtime. 420 mL 1   thyroid (NP THYROID) 60 MG tablet TAKE 1 TABLET BY MOUTH ONCE DAILY BEFORE BREAKFAST 30 tablet 5   albuterol (VENTOLIN HFA) 108 (90 Base) MCG/ACT inhaler INHALE TWO PUFFS BY MOUTH EVERY 4 HOURS INTO  THE  LUNGS  AS  NEEDED 18 g 3   furosemide (LASIX) 20 MG tablet TAKE 2 TABLETS BY MOUTH IN THE MORNING AS NEEDED (Patient taking differently: Take 20 mg by mouth. 1-2 daily) 60 tablet 5   ibuprofen (ADVIL) 200 MG tablet Take 400 mg by mouth every 6 (six) hours as needed.     polyethylene glycol-electrolytes (TRILYTE) 420 g solution Take 4,000 mLs by mouth as directed. (Patient not taking: Reported on 11/20/2021) 4000  mL 0    No results found for this or any previous visit (from the past 48 hour(s)). No results found.  Review of Systems  Constitutional: Negative.   HENT: Negative.    Eyes: Negative.   Respiratory: Negative.    Cardiovascular: Negative.   Gastrointestinal:  Positive for abdominal pain and diarrhea.  Endocrine: Negative.   Genitourinary: Negative.   Musculoskeletal: Negative.   Skin: Negative.   Allergic/Immunologic: Negative.   Neurological: Negative.   Hematological: Negative.   Psychiatric/Behavioral: Negative.     Blood pressure (!) 173/97, temperature 98.6 F (37 C), temperature source Oral, resp. rate 18, height 5\' 5"  (1.651 m), weight 136.1 kg, last menstrual period 01/23/2014, SpO2 97 %. Physical Exam  GENERAL: The patient is AO x3, in no acute distress. HEENT: Head is normocephalic and atraumatic. EOMI are intact. Mouth is well hydrated and without lesions. NECK: Supple. No masses LUNGS: Clear to auscultation. No presence of rhonchi/wheezing/rales. Adequate chest expansion HEART: RRR, normal s1 and s2. ABDOMEN: Soft, nontender, no guarding, no peritoneal signs, and nondistended. BS +. No masses. EXTREMITIES: Without any cyanosis, clubbing, rash, lesions or edema. NEUROLOGIC: AOx3, no focal motor deficit. SKIN: no jaundice, no rashes  Assessment/Plan  Summer [name] is a 61 y.o. female with past medical history of heart failure (normal EF July 2022), Fibromyalgia, Bell's palsy, hypothyroidism, HTN coming for evaluation of reflux episodes and surveillance colonoscopy for history of colon polyps, , diarrhea. We will proceed with an EGD and colonoscopy.  Harvel Quale, MD 11/24/2021, 1:55 PM

## 2021-11-24 NOTE — Op Note (Signed)
[facility] Patient Name: Summer [name] Procedure Date: 11/24/2021 2:15 PM MRN: [id number] Date of Birth: [birth year] Attending MD: Maylon Peppers ,  CSN: [id number] Age: 61 Admit Type: Outpatient Procedure:                Colonoscopy Indications:              Chronic diarrhea Providers:                Maylon Peppers, Selena Lesser RN, RN, Hughie Closs, RN, Randa Spike, Technician Referring MD:              Medicines:                Monitored Anesthesia Care Complications:            No immediate complications. Estimated Blood Loss:     Estimated blood loss: none. Procedure:                Pre-Anesthesia Assessment:                           - Prior to the procedure, a History and Physical                            was performed, and patient medications, allergies                            and sensitivities were reviewed. The patient's                            tolerance of previous anesthesia was reviewed.                           - The risks and benefits of the procedure and the                            sedation options and risks were discussed with the                            patient. All questions were answered and informed                            consent was obtained.                           - ASA Grade Assessment: III - A patient with severe                            systemic disease.                           After obtaining informed consent, the colonoscope                            was passed under direct vision. Throughout the  procedure, the patient's blood pressure, pulse, and                            oxygen saturations were monitored continuously. The                            PCF-HQ190L ([id number]) was introduced through the                            anus and advanced to the the cecum, identified by                            appendiceal orifice and ileocecal valve. The                             colonoscopy was performed without difficulty. The                            patient tolerated the procedure well. The quality                            of the bowel preparation was good. Scope In: 2:18:24 PM Scope Out: 6:56:81 PM Scope Withdrawal Time: 0 hours 20 minutes 58 seconds  Total Procedure Duration: 0 hours 27 minutes 49 seconds  Findings:      Hemorrhoids were found on perianal exam.      Six sessile polyps were found in the transverse colon, ascending colon       and cecum. The polyps were 3 to 8 mm in size. These polyps were removed       with a cold snare. Resection and retrieval were complete.      Two sessile polyps were found in the transverse colon. The polyps were 1       to 2 mm in size. These polyps were removed with a cold biopsy forceps.       Resection and retrieval were complete.      There was a medium-sized lipoma, in the transverse colon.      Three sessile polyps were found in the rectum and sigmoid colon. The       polyps were 3 to 5 mm in size. These polyps were removed with a cold       snare. Resection and retrieval were complete.      The rest of colon appeared normal. Biopsies for histology were taken       with a cold forceps from the right colon and left colon for evaluation       of microscopic colitis.      Internal hemorrhoids were found during retroflexion. Impression:               - Hemorrhoids found on perianal exam.                           - Six 3 to 8 mm polyps in the transverse colon, in                            the ascending colon and in the cecum, removed with  a cold snare. Resected and retrieved.                           - Two 1 to 2 mm polyps in the transverse colon,                            removed with a cold biopsy forceps. Resected and                            retrieved.                           - Medium-sized lipoma in the transverse colon.                           - Three 3 to 5 mm  polyps in the rectum and in the                            sigmoid colon, removed with a cold snare. Resected                            and retrieved.                           - The entire examined colon is normal. Biopsied.                           - Internal hemorrhoids. Moderate Sedation:      Per Anesthesia Care Recommendation:           - Discharge patient to home (ambulatory).                           - Resume previous diet.                           - Await pathology results.                           - Repeat colonoscopy for surveillance based on                            pathology results. Procedure Code(s):        --- Professional ---                           [phone], Colonoscopy, flexible; with removal of                            tumor(s), polyp(s), or other lesion(s) by snare                            technique                           94854, 64, Colonoscopy, flexible; with biopsy,  single or multiple Diagnosis Code(s):        --- Professional ---                           K64.8, Other hemorrhoids                           K62.1, Rectal polyp                           K63.5, Polyp of colon                           D17.5, Benign lipomatous neoplasm of                            intra-abdominal organs                           K52.9, Noninfective gastroenteritis and colitis,                            unspecified CPT copyright 2019 American Medical Association. All rights reserved. The codes documented in this report are preliminary and upon coder review may  be revised to meet current compliance requirements. Maylon Peppers, MD Maylon Peppers,  11/24/2021 2:52:20 PM This report has been signed electronically. Number of Addenda: 0

## 2021-11-24 NOTE — Discharge Instructions (Addendum)
You are being discharged to home.  °Resume your previous diet.  °We are waiting for your pathology results.  °Continue your present medications.  °Your physician has recommended a repeat colonoscopy for surveillance based on pathology results.  °

## 2021-11-24 NOTE — Op Note (Signed)
[facility] Patient Name: Summer [name] Procedure Date: 11/24/2021 1:55 PM MRN: [id number] Date of Birth: 15-Sep-1961 Attending MD: Maylon Peppers ,  CSN: [id number] Age: 61 Admit Type: Outpatient Procedure:                Upper GI endoscopy Indications:              Suspected esophageal reflux Providers:                Maylon Peppers, Charlsie Quest. Theda Sers RN, RN, Hughie Closs, RN, Randa Spike, Technician Referring MD:              Medicines:                Monitored Anesthesia Care Complications:            No immediate complications. Estimated Blood Loss:     Estimated blood loss: none. Procedure:                Pre-Anesthesia Assessment:                           - Prior to the procedure, a History and Physical                            was performed, and patient medications, allergies                            and sensitivities were reviewed. The patient's                            tolerance of previous anesthesia was reviewed.                           - The risks and benefits of the procedure and the                            sedation options and risks were discussed with the                            patient. All questions were answered and informed                            consent was obtained.                           - ASA Grade Assessment: III - A patient with severe                            systemic disease.                           After obtaining informed consent, the endoscope was                            passed under direct vision. Throughout  the                            procedure, the patient's blood pressure, pulse, and                            oxygen saturations were monitored continuously. The                            [id number]-H190 ([id number]) scope was introduced through the                            mouth, and advanced to the second part of duodenum.                            The upper GI endoscopy was  accomplished without                            difficulty. The patient tolerated the procedure                            well. Scope In: 2:09:07 PM Scope Out: 2:13:59 PM Total Procedure Duration: 0 hours 4 minutes 52 seconds  Findings:      A 2 cm hiatal hernia was present.      The exam of the esophagus was otherwise normal.      The entire examined stomach was normal. Biopsies were taken with a cold       forceps for Helicobacter pylori testing.      The examined duodenum was normal. Biopsies were taken with a cold       forceps for histology. Impression:               - 2 cm hiatal hernia.                           - Normal stomach. Biopsied.                           - Normal examined duodenum. Biopsied. Moderate Sedation:      Per Anesthesia Care Recommendation:           - Discharge patient to home (ambulatory).                           - Resume previous diet.                           - Await pathology results.                           - Continue present medications. Procedure Code(s):        --- Professional ---                           [phone], Esophagogastroduodenoscopy, flexible,                            transoral; with biopsy,  single or multiple Diagnosis Code(s):        --- Professional ---                           K44.9, Diaphragmatic hernia without obstruction or                            gangrene CPT copyright 2019 American Medical Association. All rights reserved. The codes documented in this report are preliminary and upon coder review may  be revised to meet current compliance requirements. Maylon Peppers, MD Maylon Peppers,  11/24/2021 2:17:32 PM This report has been signed electronically. Number of Addenda: 0

## 2021-11-24 NOTE — Anesthesia Procedure Notes (Signed)
Date/Time: 11/24/2021 2:02 PM Performed by: Vista Deck, CRNA Pre-anesthesia Checklist: Patient identified, Emergency Drugs available, Suction available, Timeout performed and Patient being monitored Patient Re-evaluated:Patient Re-evaluated prior to induction Oxygen Delivery Method: Non-rebreather mask

## 2021-11-24 NOTE — Transfer of Care (Addendum)
Immediate Anesthesia Transfer of Care Note  Patient: Summer [name]  Procedure(s) Performed: COLONOSCOPY WITH PROPOFOL ESOPHAGOGASTRODUODENOSCOPY (EGD) WITH PROPOFOL BIOPSY POLYPECTOMY  Patient Location: Short Stay  Anesthesia Type:General  Level of Consciousness: awake and patient cooperative  Airway & Oxygen Therapy: Patient Spontanous Breathing  Post-op Assessment: Report given to RN and Post -op Vital signs reviewed and stable  Post vital signs: Reviewed and stable  Last Vitals:  Vitals Value Taken Time  BP 102/67 11/24/2021 1455  Temp 36.3 11/24/2021 1455  Pulse 79 11/24/2021 1455  Resp 16 11/24/2021 1455  SpO2 95 11/24/2021 1455   SEE PACU FLOW SHEET FOR VITAL SIGN Last Pain:  Vitals:   11/24/21 1403  TempSrc:   PainSc: 5       Patients Stated Pain Goal: 5 (16/10/96 0454)  Complications: No notable events documented.

## 2021-11-24 NOTE — Anesthesia Preprocedure Evaluation (Signed)
Anesthesia Evaluation  Patient identified by MRN, date of birth, ID band Patient awake    Reviewed: Allergy & Precautions, H&P , NPO status , Patient's Chart, lab work & pertinent test results, reviewed documented beta blocker date and time   Airway Mallampati: II  TM Distance: >3 FB Neck ROM: full    Dental no notable dental hx.    Pulmonary neg pulmonary ROS, former smoker,    Pulmonary exam normal breath sounds clear to auscultation       Cardiovascular Exercise Tolerance: Good hypertension, negative cardio ROS   Rhythm:regular Rate:Normal     Neuro/Psych  Neuromuscular disease negative psych ROS   GI/Hepatic Neg liver ROS, GERD  Medicated,  Endo/Other  Hypothyroidism Morbid obesity  Renal/GU negative Renal ROS  negative genitourinary   Musculoskeletal   Abdominal   Peds  Hematology negative hematology ROS (+)   Anesthesia Other Findings   Reproductive/Obstetrics negative OB ROS                             Anesthesia Physical Anesthesia Plan  ASA: 3  Anesthesia Plan: General   Post-op Pain Management:    Induction:   PONV Risk Score and Plan: Propofol infusion  Airway Management Planned:   Additional Equipment:   Intra-op Plan:   Post-operative Plan:   Informed Consent: I have reviewed the patients History and Physical, chart, labs and discussed the procedure including the risks, benefits and alternatives for the proposed anesthesia with the patient or authorized representative who has indicated his/her understanding and acceptance.     Dental Advisory Given  Plan Discussed with: CRNA  Anesthesia Plan Comments:         Anesthesia Quick Evaluation

## 2021-11-25 ENCOUNTER — Encounter (INDEPENDENT_AMBULATORY_CARE_PROVIDER_SITE_OTHER): Payer: Self-pay | Admitting: *Deleted

## 2021-11-25 ENCOUNTER — Telehealth (INDEPENDENT_AMBULATORY_CARE_PROVIDER_SITE_OTHER): Payer: Self-pay

## 2021-11-25 NOTE — Telephone Encounter (Signed)
Patient called today states she had a tcs and egd done on 11/24/2021. She has a sore throat now and it hurts to talk and swallow. She states there is a specific spot on left side of throat and the left jaw that is sore. I advised that this occurs sometimes and she could drink warm or cold liquids and take tylenol prn to help with the soreness, she could gargle with warm salt water or suck on Cepacol sore throat drops to help with the pain. She also wanted me to let Doctor know that she misunderstood when he asked her prior to egd if she gets choked on food. She says yes she does as she got choked on her sandwich last night. I advised that I would let the Doctor know this,but he was probably asking prior to Egd to know whether or not the patient needed dilation.

## 2021-11-25 NOTE — Telephone Encounter (Signed)
Patient aware of all.

## 2021-11-25 NOTE — Anesthesia Postprocedure Evaluation (Signed)
Anesthesia Post Note  Patient: Summer [name]  Procedure(s) Performed: COLONOSCOPY WITH PROPOFOL ESOPHAGOGASTRODUODENOSCOPY (EGD) WITH PROPOFOL BIOPSY POLYPECTOMY  Patient location during evaluation: Phase II Anesthesia Type: General Level of consciousness: awake Pain management: pain level controlled Vital Signs Assessment: post-procedure vital signs reviewed and stable Respiratory status: spontaneous breathing and respiratory function stable Cardiovascular status: blood pressure returned to baseline and stable Postop Assessment: no headache and no apparent nausea or vomiting Anesthetic complications: no Comments: Late entry   No notable events documented.   Last Vitals:  Vitals:   11/24/21 1245 11/24/21 1452  BP: (!) 173/97 102/67  Pulse:  79  Resp: 18 16  Temp: 37 C (!) 36.3 C  SpO2: 97% 95%    Last Pain:  Vitals:   11/24/21 1452  TempSrc: Oral  PainSc: 0-No pain                 Louann Sjogren

## 2021-11-25 NOTE — Telephone Encounter (Signed)
Thanks, those recommendations are adequate. That's correct, I asker her several times to determine if we needed to dilate empirically or not. This was not performed as she denied any of these symptoms. Did not see any obstruction.

## 2021-11-26 LAB — SURGICAL PATHOLOGY

## 2021-11-30 ENCOUNTER — Encounter (HOSPITAL_COMMUNITY): Payer: Self-pay | Admitting: Gastroenterology

## 2021-12-19 ENCOUNTER — Other Ambulatory Visit: Payer: Self-pay | Admitting: Family Medicine

## 2021-12-21 NOTE — Telephone Encounter (Signed)
5 refills each 

## 2021-12-23 ENCOUNTER — Ambulatory Visit: Payer: 59 | Admitting: Allergy & Immunology

## 2021-12-23 ENCOUNTER — Other Ambulatory Visit: Payer: Self-pay

## 2021-12-23 ENCOUNTER — Encounter: Payer: Self-pay | Admitting: Allergy & Immunology

## 2021-12-23 VITALS — BP 146/88 | HR 103 | Temp 98.8°F | Resp 20 | Ht 65.0 in | Wt 301.8 lb

## 2021-12-23 DIAGNOSIS — T7840XD Allergy, unspecified, subsequent encounter: Secondary | ICD-10-CM

## 2021-12-23 DIAGNOSIS — L299 Pruritus, unspecified: Secondary | ICD-10-CM

## 2021-12-23 MED ORDER — CROMOLYN SODIUM 100 MG/5ML PO CONC: 100.0000 mg | Freq: Four times a day (QID) | ORAL | 5 refills | Status: AC

## 2021-12-23 NOTE — Patient Instructions (Addendum)
1. Perennial allergic rhinitis - indoor molds - Continue with: Zyrtec (cetirizine) 10mg  tablet twice daily and Pepcid (famotidine) 40mg  twice daily - Continue with: Singulair (montelukast) 10mg  daily - Start taking: Cromolyn 200mg  four times daily (30 min before meals).  - You can use an extra dose of the antihistamine, if needed, for breakthrough symptoms.   2. Allergic reaction - unknown trigger - We are going to get some labs to look for serious causes of allergic reactions. - We will also get some environmental allergy testing via the blood. - Please start the Cromolyn (I sent in the prescription). - EpiPen training provided.   3. Return in about 3 months (around 03/22/2022).    Please inform us of any Emergency Department visits, hospitalizations, or changes in symptoms. Call us before going to the ED for breathing or allergy symptoms since we might be able to fit you in for a sick visit. Feel free to contact us anytime with any questions, problems, or concerns.  It was a pleasure to see you again today!  Websites that have reliable patient information: 1. American Academy of Asthma, Allergy, and Immunology: www.aaaai.org 2. Food Allergy Research and Education (FARE): foodallergy.org 3. Mothers of Asthmatics: http://www.asthmacommunitynetwork.org 4. American College of Allergy, Asthma, and Immunology: www.acaai.org   COVID-19 Vaccine Information can be found at: ShippingScam.co.uk For questions related to vaccine distribution or appointments, please email [email or web address] or call [phone].   We realize that you might be concerned about having an allergic reaction to the COVID19 vaccines. To help with that concern, WE ARE OFFERING THE COVID19 VACCINES IN OUR OFFICE! Ask the front desk for dates!     Like [place] on [employer] and Instagram for our latest updates!      A healthy democracy works best when [employer]  participate! Make sure you are registered to vote! If you have moved or changed any of your contact information, you will need to get this updated before voting!  In some cases, you MAY be able to register to vote online: [email or web address]

## 2021-12-23 NOTE — Progress Notes (Signed)
FOLLOW UP  Date of Service/Encounter:  12/23/21   Assessment:   Perennial allergic rhinitis (indoor molds)   Allergic reaction - unknown trigger   She remains interested in doing labs to try to figure out what is causing all of these symptoms. The fact that cetirizine alone seems to decrease all of her symptoms does make an allergic etiology likely, although I have a hard time connecting all of her symptoms to allergies. Hopefully all of these labs will be normal. I do think that she would benefit from getting a dehumidifier for her apartment to decrease the humidity in the apartment and deter growth of any mold. She is going to look into that. She remains on the list to change apartments, which is ultimately going to be the best thing for her.   Plan/Recommendations:   1. Perennial allergic rhinitis - indoor molds - Continue with: Zyrtec (cetirizine) 10mg  tablet twice daily and Pepcid (famotidine) 40mg  twice daily - Continue with: Singulair (montelukast) 10mg  daily - Start taking: Cromolyn 200mg  four times daily (30 min before meals).  - You can use an extra dose of the antihistamine, if needed, for breakthrough symptoms.   2. Allergic reaction - unknown trigger - We are going to get some labs to look for serious causes of allergic reactions. - We will also get some environmental allergy testing via the blood. - Please start the Cromolyn (I sent in the prescription). - EpiPen training provided.   3. Return in about 3 months (around 03/22/2022).   Subjective:   Summer [name] is a 61 y.o. female presenting today for follow up of  Chief Complaint  Patient presents with   Perennial Allergic Rhinitis    2 mth f/u - medications have helped some; Patients states she is now ready to discuss having blood work done    [employer] has a history of the following: Patient Active Problem List   Diagnosis Date Noted   Severe obesity (BMI >= 40) ([place]) 05/28/2021   Chest pain  12/17/2020   Left upper quadrant abdominal pain 12/17/2020   Hematuria 11/05/2020   Cough 11/05/2020   Group B streptococcal UTI 11/02/2020   Acute cystitis with hematuria 10/28/2020   Ketonuria 10/28/2020   Rectal bleeding 10/28/2020   Postmenopausal bleeding 08/10/2018   Palpitations 10/09/2016   Prediabetes 06/10/2014   Subclinical hypothyroidism 06/10/2014   Abdominal pain 12/26/2013   Esophageal reflux 04/01/2013   Fibromyalgia 03/22/2013   Hypokalemia 03/22/2013   Facial paralysis/Bells palsy 02/01/2013    History obtained from: chart review and patient.  Summer [name] is a 61 y.o. female presenting for a follow up visit.  She was last seen in December 2022.  At that time, she underwent an evaluation for allergies.  Testing was positive only to indoor molds.  We continue with Zyrtec twice daily and Pepcid twice daily.  We started Singulair 10 mg daily and cromolyn 200 mg 4 times daily.  We were not sure what was causing her allergic reaction.  Testing to all of the selected foods was negative.  We decided to tentatively treat this as her last cell-mediated disease.  We talked about doing labs, but she apparently had bad debt with [facility] and did not want to get any labs done.  Since the last visit, she has continued to have issues. She is doing Zyrtec and Pepcid BID. She is on the Singulair daily. This has helped somewhat. She is not having issues every single day now, but she  does notice that her eyes watery and her nose runs when she is eating. She is reacting to smells that "knock [her] out" and she immediately got a headache and had burning of her eyes. She does wonder whether she needs an EpiPen. She has never gotten to that point yet but she is concerned that this might be happen. This is not always related to foods.   She is on a waiting list to move to another apartment. She reports that her apartment is making her very sick. She cannot SEE mold, but she can SMELL it. Therefore she  has no proof that there is mold there. She is thinking of getting one of those kits to look for mold inside of the home.  She did have an endoscopy and colonoscopy. She had several pre-cancerous polyps removed. She has quit red meat, bacon, sausage and other cured meats to decrease her risk of cancer. She has made a lot of changes to try to decrease her health risks.   Otherwise, there have been no changes to her past medical history, surgical history, family history, or social history.    Review of Systems  Constitutional: Negative.  Negative for chills, fever, malaise/fatigue and weight loss.  HENT:  Positive for congestion. Negative for ear discharge and ear pain.   Eyes:  Negative for pain, discharge and redness.  Respiratory:  Negative for cough, sputum production, shortness of breath and wheezing.   Cardiovascular: Negative.  Negative for chest pain and palpitations.  Gastrointestinal:  Negative for abdominal pain, constipation, diarrhea, heartburn, nausea and vomiting.  Skin:  Positive for itching and rash.  Neurological:  Positive for headaches. Negative for dizziness.  Endo/Heme/Allergies:  Negative for environmental allergies. Does not bruise/bleed easily.      Objective:   Blood pressure (!) 146/88, pulse (!) 103, temperature 98.8 F (37.1 C), resp. rate 20, height 5\' 5"  (1.651 m), weight (!) 301 lb 12.8 oz (136.9 kg), last menstrual period 01/23/2014, SpO2 96 %. Body mass index is 50.22 kg/m.    Physical Exam Vitals reviewed.  Constitutional:      Appearance: She is well-developed.  HENT:     Head: Normocephalic and atraumatic.     Right Ear: Tympanic membrane, ear canal and external ear normal. No drainage, swelling or tenderness. Tympanic membrane is not injected, scarred, erythematous, retracted or bulging.     Left Ear: Tympanic membrane, ear canal and external ear normal. No drainage, swelling or tenderness. Tympanic membrane is not injected, scarred,  erythematous, retracted or bulging.     Nose: No nasal deformity, septal deviation, mucosal edema or rhinorrhea.     Right Turbinates: Enlarged, swollen and pale.     Left Turbinates: Enlarged, swollen and pale.     Right Sinus: No maxillary sinus tenderness or frontal sinus tenderness.     Left Sinus: No maxillary sinus tenderness or frontal sinus tenderness.     Comments: No polyps noted.     Mouth/Throat:     Mouth: Mucous membranes are not pale and not dry.     Pharynx: Uvula midline.  Eyes:     General:        Right eye: No discharge.        Left eye: No discharge.     Conjunctiva/sclera: Conjunctivae normal.     Right eye: Right conjunctiva is not injected. No chemosis.    Left eye: Left conjunctiva is not injected. No chemosis.    Pupils: Pupils are equal, round, and reactive  to light.  Cardiovascular:     Rate and Rhythm: Normal rate and regular rhythm.     Heart sounds: Normal heart sounds.  Pulmonary:     Effort: Pulmonary effort is normal. No tachypnea, accessory muscle usage or respiratory distress.     Breath sounds: Normal breath sounds. No wheezing, rhonchi or rales.     Comments: Moving air well in all lung fields. No increased work of breathing noted.  Chest:     Chest wall: No tenderness.  Abdominal:     Tenderness: There is no abdominal tenderness. There is no guarding or rebound.  Lymphadenopathy:     Head:     Right side of head: No submandibular, tonsillar or occipital adenopathy.     Left side of head: No submandibular, tonsillar or occipital adenopathy.     Cervical: No cervical adenopathy.  Skin:    General: Skin is warm.     Capillary Refill: Capillary refill takes less than 2 seconds.     Coloration: Skin is not pale.     Findings: No abrasion, erythema, petechiae or rash. Rash is not papular, urticarial or vesicular.     Comments: Skin somewhat dermatographic.   Neurological:     Mental Status: She is alert.  Psychiatric:        Behavior:  Behavior is cooperative.     Diagnostic studies: labs ordered        Salvatore Marvel, MD  [facility]

## 2021-12-24 ENCOUNTER — Encounter: Payer: Self-pay | Admitting: Allergy & Immunology

## 2021-12-25 ENCOUNTER — Ambulatory Visit: Payer: 59 | Admitting: Internal Medicine

## 2021-12-29 ENCOUNTER — Telehealth: Payer: Self-pay | Admitting: Allergy & Immunology

## 2021-12-29 MED ORDER — EPINEPHRINE 0.3 MG/0.3ML IJ SOAJ: 0.3000 mg | Freq: Once | INTRAMUSCULAR | 1 refills | Status: AC

## 2021-12-29 NOTE — Telephone Encounter (Signed)
Patient states she was supposed to have prescriptions for Epi-Pen and Cromolyn Sodium and never received them.  Patient would like those called in to [facility] in [place].

## 2021-12-29 NOTE — Telephone Encounter (Signed)
Called patient - DOB verified - advised Cromolyn was electronically sent [facility] on 12/23/21.  I called [facility] - spoke to [place] Kim advised Rx Cromolyn was put on hold but they will be ordering it today and it should be in by Thursday, 12/31/21. I asked if the patient had been notified - she said no, I asked if she will contact the patient advising of the glitch and that the Rx is in the process of being filled  - Kim confirmed she would contact the patient.  Electronically sending in Epi pen to pharmacy as well.

## 2021-12-29 NOTE — Telephone Encounter (Signed)
Thanks for taking care of that. I appreciate it!   Salvatore Marvel, MD [facility]

## 2021-12-30 LAB — CBC WITH DIFFERENTIAL
Basophils Absolute: 0.1 10*3/uL (ref 0.0–0.2)
Basos: 1 %
EOS (ABSOLUTE): 0.2 10*3/uL (ref 0.0–0.4)
Eos: 3 %
Hematocrit: 49.4 % — ABNORMAL HIGH (ref 34.0–46.6)
Hemoglobin: 16.5 g/dL — ABNORMAL HIGH (ref 11.1–15.9)
Immature Grans (Abs): 0 10*3/uL (ref 0.0–0.1)
Immature Granulocytes: 0 %
Lymphocytes Absolute: 2.8 10*3/uL (ref 0.7–3.1)
Lymphs: 38 %
MCH: 29.9 pg (ref 26.6–33.0)
MCHC: 33.4 g/dL (ref 31.5–35.7)
MCV: 90 fL (ref 79–97)
Monocytes Absolute: 0.6 10*3/uL (ref 0.1–0.9)
Monocytes: 8 %
Neutrophils Absolute: 3.8 10*3/uL (ref 1.4–7.0)
Neutrophils: 50 %
RBC: 5.52 x10E6/uL — ABNORMAL HIGH (ref 3.77–5.28)
RDW: 14 % (ref 11.7–15.4)
WBC: 7.5 10*3/uL (ref 3.4–10.8)

## 2021-12-30 LAB — CMP14+EGFR
ALT: 17 IU/L (ref 0–32)
AST: 21 IU/L (ref 0–40)
Albumin/Globulin Ratio: 1.8 (ref 1.2–2.2)
Albumin: 4.2 g/dL (ref 3.8–4.9)
Alkaline Phosphatase: 119 IU/L (ref 44–121)
BUN/Creatinine Ratio: 33 — ABNORMAL HIGH (ref 9–23)
BUN: 20 mg/dL (ref 6–24)
Bilirubin Total: 0.4 mg/dL (ref 0.0–1.2)
CO2: 23 mmol/L (ref 20–29)
Calcium: 9.7 mg/dL (ref 8.7–10.2)
Chloride: 107 mmol/L — ABNORMAL HIGH (ref 96–106)
Creatinine, Ser: 0.6 mg/dL (ref 0.57–1.00)
Globulin, Total: 2.3 g/dL (ref 1.5–4.5)
Glucose: 124 mg/dL — ABNORMAL HIGH (ref 70–99)
Potassium: 4.2 mmol/L (ref 3.5–5.2)
Sodium: 146 mmol/L — ABNORMAL HIGH (ref 134–144)
Total Protein: 6.5 g/dL (ref 6.0–8.5)
eGFR: 104 mL/min/{1.73_m2} (ref >59)

## 2021-12-30 LAB — ALPHA-GAL PANEL
Allergen Lamb IgE: <0.1 kU/L
Beef IgE: <0.1 kU/L
IgE (Immunoglobulin E), Serum: 3 IU/mL — ABNORMAL LOW (ref 6–495)
O215-IgE Alpha-Gal: <0.1 kU/L
Pork IgE: <0.1 kU/L

## 2021-12-30 LAB — ALLERGENS W/COMP RFLX AREA 2
Alternaria Alternata IgE: <0.1 kU/L
Aspergillus Fumigatus IgE: <0.1 kU/L
Bermuda Grass IgE: <0.1 kU/L
Cedar, Mountain IgE: <0.1 kU/L
Cladosporium Herbarum IgE: <0.1 kU/L
Cockroach, German IgE: <0.1 kU/L
Common Silver Birch IgE: <0.1 kU/L
Cottonwood IgE: <0.1 kU/L
D Farinae IgE: <0.1 kU/L
D Pteronyssinus IgE: <0.1 kU/L
E001-IgE Cat Dander: <0.1 kU/L
E005-IgE Dog Dander: <0.1 kU/L
Elm, American IgE: <0.1 kU/L
Johnson Grass IgE: <0.1 kU/L
Maple/Box Elder IgE: <0.1 kU/L
Mouse Urine IgE: <0.1 kU/L
Oak, White IgE: <0.1 kU/L
Pecan, Hickory IgE: <0.1 kU/L
Penicillium Chrysogen IgE: <0.1 kU/L
Pigweed, Rough IgE: <0.1 kU/L
Ragweed, Short IgE: <0.1 kU/L
Sheep Sorrel IgE Qn: <0.1 kU/L
Timothy Grass IgE: <0.1 kU/L
White Mulberry IgE: <0.1 kU/L

## 2021-12-30 LAB — ALLERGEN PROFILE, MOLD
Aureobasidi Pullulans IgE: <0.1 kU/L
Candida Albicans IgE: <0.1 kU/L
M009-IgE Fusarium proliferatum: <0.1 kU/L
M014-IgE Epicoccum purpur: <0.1 kU/L
Mucor Racemosus IgE: <0.1 kU/L
Phoma Betae IgE: <0.1 kU/L
Setomelanomma Rostrat: <0.1 kU/L
Stemphylium Herbarum IgE: <0.1 kU/L

## 2021-12-30 LAB — CHRONIC URTICARIA: cu index: <1.9 (ref <10)

## 2021-12-30 LAB — THYROID ANTIBODIES
Thyroglobulin Antibody: 1.3 IU/mL — ABNORMAL HIGH (ref 0.0–0.9)
Thyroperoxidase Ab SerPl-aCnc: >600 IU/mL — ABNORMAL HIGH (ref 0–34)

## 2021-12-30 LAB — ANTINUCLEAR ANTIBODIES, IFA: ANA Titer 1: NEGATIVE

## 2021-12-30 LAB — C-REACTIVE PROTEIN: CRP: 8 mg/L (ref 0–10)

## 2021-12-30 LAB — TRYPTASE: Tryptase: 3.6 ug/L (ref 2.2–13.2)

## 2021-12-30 LAB — SEDIMENTATION RATE: Sed Rate: 19 mm/hr (ref 0–40)

## 2022-01-11 ENCOUNTER — Other Ambulatory Visit: Payer: Self-pay | Admitting: Family Medicine

## 2022-01-12 ENCOUNTER — Telehealth: Payer: Self-pay | Admitting: *Deleted

## 2022-01-12 NOTE — Telephone Encounter (Signed)
Letter to patients home in regards to lab results.  ?

## 2022-01-14 ENCOUNTER — Ambulatory Visit (INDEPENDENT_AMBULATORY_CARE_PROVIDER_SITE_OTHER): Payer: 59 | Admitting: Gastroenterology

## 2022-01-15 ENCOUNTER — Ambulatory Visit (INDEPENDENT_AMBULATORY_CARE_PROVIDER_SITE_OTHER): Payer: 59 | Admitting: Family Medicine

## 2022-01-15 ENCOUNTER — Other Ambulatory Visit: Payer: Self-pay

## 2022-01-15 ENCOUNTER — Encounter: Payer: Self-pay | Admitting: Family Medicine

## 2022-01-15 VITALS — BP 138/82 | HR 81 | Temp 98.0°F | Wt 304.6 lb

## 2022-01-15 DIAGNOSIS — R0602 Shortness of breath: Secondary | ICD-10-CM | POA: Diagnosis not present

## 2022-01-15 DIAGNOSIS — G4733 Obstructive sleep apnea (adult) (pediatric): Secondary | ICD-10-CM | POA: Diagnosis not present

## 2022-01-15 DIAGNOSIS — D582 Other hemoglobinopathies: Secondary | ICD-10-CM | POA: Insufficient documentation

## 2022-01-15 DIAGNOSIS — J439 Emphysema, unspecified: Secondary | ICD-10-CM | POA: Insufficient documentation

## 2022-01-15 DIAGNOSIS — J432 Centrilobular emphysema: Secondary | ICD-10-CM

## 2022-01-15 DIAGNOSIS — E785 Hyperlipidemia, unspecified: Secondary | ICD-10-CM | POA: Insufficient documentation

## 2022-01-15 NOTE — Patient Instructions (Signed)
Stay active. ? ?I will place order regarding the sleep study. ? ?Follow up with Dr. Wolfgang Phoenix. ? ?Take care ? ?Dr. Lacinda Axon ?

## 2022-01-18 DIAGNOSIS — R0602 Shortness of breath: Secondary | ICD-10-CM | POA: Insufficient documentation

## 2022-01-18 DIAGNOSIS — R0609 Other forms of dyspnea: Secondary | ICD-10-CM | POA: Insufficient documentation

## 2022-01-18 NOTE — Assessment & Plan Note (Addendum)
Difficult to discern because given patient's history.  She is morbidly obese and has documented emphysema on CT imaging.  Given occurrence of symptoms at night when she is asleep and the fact that it wakes her from sleep, I am concerned that she has sleep apnea.  As far as the daytime symptoms, when she reports that she exhales hard or that it stops her during exhalation I feel that this may be due to PVC.  Arranging for sleep study.   ?

## 2022-01-18 NOTE — Progress Notes (Signed)
? ?Subjective:  ?Patient ID: Summer [name], female    DOB: 03/25/1961  Age: 61 y.o. MRN: [id number] ? ?CC: ?Chief Complaint  ?Patient presents with  ? discuss breathing  ?  Patient states she has been having breathing issues for a while at night but started day and night 2 weeks ago. Patient states she breaths in fine but when she breathes out it is really loud and forceful and sometimes "double exhales" and it scares her and makes her feel swimmy headed sometimes.  ? ? ?HPI: ? ?61 year old female presents for evaluation of shortness of breath. ? ?Patient reports ongoing shortness of breath.  She has some baseline shortness of breath which she has been seen for previously.  Patient reports that over the past 2 weeks symptoms have seem to be worsening.  She reports that she often "exhales hard".  She states that this has been occurring at night and she is concerned for sleep apnea.  It also has been occurring during the day.  She has had some ongoing dizziness as well.  Denies palpitations.  Patient also concerned that this may be related to her living situation.  She believes she has black mold in her apartment and thinks that this may be contributing. ? ?Patient Active Problem List  ? Diagnosis Date Noted  ? SOB (shortness of breath) 01/18/2022  ? Emphysema lung ([place]) 01/15/2022  ? Hyperlipidemia 01/15/2022  ? Elevated hemoglobin ([place]) 01/15/2022  ? Severe obesity (BMI >= 40) ([place]) 05/28/2021  ? Prediabetes 06/10/2014  ? Subclinical hypothyroidism 06/10/2014  ? Esophageal reflux 04/01/2013  ? Fibromyalgia 03/22/2013  ? Facial paralysis/Bells palsy 02/01/2013  ? ? ?Social Hx   ?Social History  ? ?Socioeconomic History  ? Marital status: Married  ?  Spouse name: Not on file  ? Number of children: Not on file  ? Years of education: Not on file  ? Highest education level: Not on file  ?Occupational History  ? Not on file  ?Tobacco Use  ? Smoking status: Former  ?  Packs/day: 1.00  ?  Years: 20.00  ?  Pack years: 20.00   ?  Types: Cigarettes  ?  Quit date: 11/08/2003  ?  Years since quitting: 18.2  ? Smokeless tobacco: Never  ? Tobacco comments:  ?  quit smoking 10 yrs   ?Vaping Use  ? Vaping Use: Never used  ?Substance and Sexual Activity  ? Alcohol use: No  ? Drug use: No  ? Sexual activity: Yes  ?  Birth control/protection: Post-menopausal  ?Other Topics Concern  ? Not on file  ?Social History Narrative  ? Not on file  ? ?Social Determinants of Health  ? ?Financial Resource Strain: Not on file  ?Food Insecurity: Not on file  ?Transportation Needs: Not on file  ?Physical Activity: Not on file  ?Stress: Not on file  ?Social Connections: Not on file  ? ? ?Review of Systems ?Per HPI ? ?Objective:  ?BP 138/82   Pulse 81   Temp 98 ?F (36.7 ?C) (Oral)   Wt (!) 304 lb 9.6 oz (138.2 kg)   LMP 01/23/2014 Comment: spotting  SpO2 98%   BMI 50.69 kg/m?  ? ?BP/Weight 01/15/2022 12/23/2021 11/24/2021  ?Systolic BP 829 937 169  ?Diastolic BP 82 88 67  ?Wt. (Lbs) 304.6 301.8 300  ?BMI 50.69 50.22 49.92  ? ? ?Physical Exam ?Vitals and nursing note reviewed.  ?Constitutional:   ?   General: She is not in acute distress. ?  Appearance: She is obese.  ?HENT:  ?   Head: Normocephalic and atraumatic.  ?Eyes:  ?   General:     ?   Right eye: No discharge.     ?   Left eye: No discharge.  ?   Conjunctiva/sclera: Conjunctivae normal.  ?Cardiovascular:  ?   Rate and Rhythm: Normal rate and regular rhythm.  ?Pulmonary:  ?   Effort: Pulmonary effort is normal.  ?   Breath sounds: Normal breath sounds. No wheezing, rhonchi or rales.  ?Neurological:  ?   Mental Status: She is alert.  ?Psychiatric:     ?   Mood and Affect: Mood normal.     ?   Behavior: Behavior normal.  ? ? ?Lab Results  ?Component Value Date  ? WBC 7.5 12/23/2021  ? HGB 16.5 (H) 12/23/2021  ? HCT 49.4 (H) 12/23/2021  ? PLT 239 04/08/2021  ? GLUCOSE 124 (H) 12/23/2021  ? CHOL 173 04/03/2021  ? TRIG 127 04/03/2021  ? HDL 52 04/03/2021  ? [place] 98 04/03/2021  ? ALT 17 12/23/2021  ? AST 21  12/23/2021  ? NA 146 (H) 12/23/2021  ? K 4.2 12/23/2021  ? CL 107 (H) 12/23/2021  ? CREATININE 0.60 12/23/2021  ? BUN 20 12/23/2021  ? CO2 23 12/23/2021  ? TSH 2.900 04/03/2021  ? HGBA1C 6.2 (H) 04/03/2021  ? ? ? ?Assessment & Plan:  ? ?Problem List Items Addressed This Visit   ? ?  ? Other  ? SOB (shortness of breath) - Primary  ?  Difficult to discern because given patient's history.  She is morbidly obese and has documented emphysema on CT imaging.  Given occurrence of symptoms at night when she is asleep and the fact that it wakes her from sleep, I am concerned that she has sleep apnea.  As far as the daytime symptoms, when she reports that she exhales hard or that it stops her during exhalation I feel that this may be due to PVC.  Arranging for sleep study.   ?  ?  ? ?Other Visit Diagnoses   ? ? OSA (obstructive sleep apnea)      ? Relevant Orders  ? Ambulatory referral to Sleep Studies  ? ?  ? ?Thersa Salt DO ?[place] ? ?

## 2022-01-29 NOTE — Telephone Encounter (Signed)
Patient called back and would like her lab results. ?

## 2022-01-29 NOTE — Telephone Encounter (Signed)
Called and spoke with the patient and reviewed lab results with her, patient states that she does take a thyroid medication and she has hashimoto disease. Patient stated that was able to pick up the Cromolyn that it was $50 but she hasn't started it yet. She did stated that the other day she picked up a new bag of [employer] and experienced a burning sensation from her tongue all the way through her stomach, she says that she has the same issue with she eats the [place] dressing she used to eat all the time.  ?

## 2022-02-01 NOTE — Telephone Encounter (Signed)
Goodness that is expensive. It will be interesting to know whether this helps or not. ? ?Salvatore Marvel, MD ?[facility] ? ?

## 2022-02-16 ENCOUNTER — Telehealth: Payer: Self-pay | Admitting: Family Medicine

## 2022-02-16 NOTE — Telephone Encounter (Signed)
Message sent to referral coordinator to check on referral ?

## 2022-02-16 NOTE — Telephone Encounter (Signed)
Husband was here yesterday-he states patient states that she has not heard regarding her sleep study.  Please reprocess request if necessary thank you ?If possible notify patient or send her letter notifying her what ever you choose thank you ?

## 2022-02-18 ENCOUNTER — Ambulatory Visit: Payer: 59 | Admitting: Internal Medicine

## 2022-03-05 NOTE — Telephone Encounter (Signed)
Earma Reading E   ? ? ?LM for Patient concerning Referral. Nothing further needed at this time.  ? ?

## 2022-03-10 ENCOUNTER — Encounter: Payer: Self-pay | Admitting: Internal Medicine

## 2022-03-10 ENCOUNTER — Ambulatory Visit (INDEPENDENT_AMBULATORY_CARE_PROVIDER_SITE_OTHER): Payer: 59 | Admitting: Internal Medicine

## 2022-03-10 VITALS — BP 142/90 | HR 82 | Ht 65.0 in | Wt 307.4 lb

## 2022-03-10 DIAGNOSIS — R002 Palpitations: Secondary | ICD-10-CM | POA: Diagnosis not present

## 2022-03-10 DIAGNOSIS — R634 Abnormal weight loss: Secondary | ICD-10-CM

## 2022-03-10 DIAGNOSIS — E063 Autoimmune thyroiditis: Secondary | ICD-10-CM | POA: Diagnosis not present

## 2022-03-10 NOTE — Patient Instructions (Signed)
Medication Instructions:  ?No changes ? ?Labwork: ?None ? ?Testing/Procedures: ?None ? ?Follow-Up: ?Follow up with Dr. Harrington Challenger in January 2024. ? ?Any Other Special Instructions Will Be Listed Below (If Applicable). ? ?You have been referred to [facility]- Dr. Dorris Fetch. They will call you with your first appointment.  ? ? ?If you need a refill on your cardiac medications before your next appointment, please call your pharmacy. ? ?

## 2022-03-10 NOTE — Progress Notes (Signed)
? ?Cardiology Office Note ? ? ?Date:  03/10/2022  ? ?ID:  Summer [name], DOB 1960/11/09, MRN [id number] ? ?PCP:  Summer Drown, MD  ?Cardiologist:   Summer Carnes, MD  ? ? ?Patient returns for follow up of CP and HTN    ?  ?History of Present Illness: ?Summer [name] is a 61 y.o. female with a history of chronic CP (CT calcium score 0 in Aug 2018), palpitations, HTN, anxiety    ?In early Jun 2022 she was seen in ED for CP    Trop 22 and 24    ?The pt was seen by Lance Sell in June   Complained of SOB (chornic)  Noted to be tachycardic    Set up for CT angio of chest  Neg for D Dimer   Emphysema noted and aortic atherosclerosis   ? ?I saw her in Aug 2022   She was set up for a cardiopulmoary stress test but did nt hae have done  ? ?The pt says that sometimes she exhales strange.   More with sleeping.   She is being set up for sleep study ? 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 ?BP is up a little bit today   She does not have BP cuff at home  ? ?Diet ? ?Breakfast   Skips breakfast ?Lunch    Sometimes 1/2 sandwich   Baked potato   Lemon water or diet soda ?Dinner   [place] with chicken ?The pt tried the keto diet in the past    Got a UTI     ? ?Current Meds  ?Medication Sig  ? albuterol (PROVENTIL) (2.5 MG/3ML) 0.083% nebulizer solution Take 3 mLs (2.5 mg total) by nebulization every 4 (four) hours as needed for wheezing.  ? albuterol (VENTOLIN HFA) 108 (90 Base) MCG/ACT inhaler INHALE TWO PUFFS BY MOUTH EVERY 4 HOURS INTO  THE  LUNGS  AS  NEEDED  ? ALPRAZolam (XANAX) 0.5 MG tablet 1 bid prn anxiety or insomnia caution drowsiness  ? Ascorbic Acid (VITAMIN C) 500 MG CAPS Take 1 capsule by mouth 2 (two) times daily.   ? B Complex-C (SUPER B COMPLEX PO) Take by mouth.  ? cetirizine (ZYRTEC) 10 MG tablet Take 10 mg by mouth 2 (two) times daily.  ? Cholecalciferol (VITAMIN D-3) 5000 UNITS TABS Take 1 tablet by mouth daily.  ? famotidine (PEPCID) 20 MG tablet Take 20 mg by mouth 2 (two) times daily.  ?  furosemide (LASIX) 20 MG tablet TAKE 2 TABLETS BY MOUTH IN THE MORNING AS NEEDED  ? HYDROcodone-acetaminophen (NORCO/VICODIN) 5-325 MG tablet Take 1 tablet by mouth every 6 (six) hours as needed.  ? ibuprofen (ADVIL) 200 MG tablet Take 400 mg by mouth every 6 (six) hours as needed.  ? Magnesium 200 MG TABS Take 1 tablet by mouth daily.  ? montelukast (SINGULAIR) 10 MG tablet Take 1 tablet (10 mg total) by mouth at bedtime.  ? OLIVE LEAF PO Take 1 tablet by mouth daily.  ? OVER THE COUNTER MEDICATION Red root  ? potassium chloride SA (KLOR-CON M) 20 MEQ tablet Take 1 tablet by mouth once daily  ? rosuvastatin (CRESTOR) 5 MG tablet Take 1 tablet (5 mg total) by mouth daily.  ? thyroid (NP THYROID) 60 MG tablet TAKE 1 TABLET BY MOUTH ONCE DAILY BEFORE BREAKFAST  ? ? ? ?Allergies:   Molds & smuts, Avelox [moxifloxacin hcl in nacl], Bee venom, Shellfish  allergy, and Tape  ? ?Past Medical History:  ?Diagnosis Date  ? Arthritis   ? Bell's palsy   ? Dysrhythmia, cardiac   ? Fibromyalgia   ? Hashimoto's disease   ? History of CT scan 06/2017  ? "coronary CT scan on 06/01/2017, this did not find any cardiac calcifications. Coronary calcium score of zero."  ? Hypertension   ? Thyroid disease   ? Urticaria   ? ? ?Past Surgical History:  ?Procedure Laterality Date  ? BIOPSY  11/24/2021  ? Procedure: BIOPSY;  Surgeon: Harvel Quale, MD;  [facility];  Service: Gastroenterology;;  ? COLONOSCOPY N/A 01/23/2014  ? Procedure: COLONOSCOPY;  Surgeon: Rogene Houston, MD;  Location: [facility];  Service: Endoscopy;  Laterality: N/A;  200  ? COLONOSCOPY WITH PROPOFOL N/A 11/24/2021  ? Procedure: COLONOSCOPY WITH PROPOFOL;  Surgeon: Harvel Quale, MD;  Location: [facility];  Service: Gastroenterology;  Laterality: N/A;  205  ? ESOPHAGOGASTRODUODENOSCOPY (EGD) WITH PROPOFOL N/A 11/24/2021  ? Procedure: ESOPHAGOGASTRODUODENOSCOPY (EGD) WITH PROPOFOL;  Surgeon: Harvel Quale, MD;  Location:  [facility];  Service: Gastroenterology;  Laterality: N/A;  ? POLYPECTOMY  11/24/2021  ? Procedure: POLYPECTOMY;  Surgeon: Harvel Quale, MD;  Location: [facility];  Service: Gastroenterology;;  ? TONSILLECTOMY    ? ? ? ?Social History:  The patient  reports that she quit smoking about 18 years ago. Her smoking use included cigarettes. She has a 20.00 pack-year smoking history. She has never used smokeless tobacco. She reports that she does not drink alcohol and does not use drugs.  ? ?Family History:  The patient's family history includes Cancer in her mother; Other in her daughter and father. She was adopted.  ? ? ?ROS:  Please see the history of present illness. All other systems are reviewed and  Negative to the above problem except as noted.  ? ? ?PHYSICAL EXAM: ?VS:  BP (!) 142/90   Pulse 82   Ht '5\' 5"'$  (1.651 m)   Wt (!) 307 lb 6.4 oz (139.4 kg)   LMP 01/23/2014 Comment: spotting  SpO2 96%   BMI 51.15 kg/m?   ?GEN: Morbidly obese 61 yo in no acute distress  ?HEENT: normal  ?Neck: no JVD, carotid bruits ?Cardiac: RRR; no murmurs   Tr LE edema  ?Respiratory:  clear to auscultation bilaterally  No wheezes   ?GI: soft, nontender, nondistended, + BS  No hepatomegaly  ?MS: no deformity Moving all extremities   ?Skin: warm and dry, no rash ?Neuro:  Strength and sensation are intact ?Psych: euthymic mood, full affect ? ? ?EKG:  EKG is ordered today.  NSR 82 bpm   Poor Rwave progression  ?Echo   05/20/21 ? ?1. Left ventricular ejection fraction, by estimation, is 60 to 65%. The left ventricle has normal ?function. The left ventricle has no regional wall motion abnormalities. Left ventricular ?diastolic parameters are indeterminate. ?2. Right ventricular systolic function is normal. The right ventricular size is normal. There is ?normal pulmonary artery systolic pressure. The estimated right ventricular systolic pressure is ?10.2 mmHg. ?3. The mitral valve is grossly normal. Trivial mitral valve  regurgitation. ?4. The aortic valve is tricuspid. Aortic valve regurgitation is not visualized. ?5. The inferior vena cava is normal in size with greater than 50% respiratory variability, ?suggesting right atrial pressure of 3 mmHg. ?Lipid Panel ?   ?Component Value Date/Time  ? CHOL 173 04/03/2021 1134  ? TRIG 127 04/03/2021 1134  ?  HDL 52 04/03/2021 1134  ? CHOLHDL 3.3 04/03/2021 1134  ? CHOLHDL 3.0 10/07/2016 1537  ? VLDL 22 10/07/2016 1537  ? [place] 98 04/03/2021 1134  ? ?  ? ?Wt Readings from Last 3 Encounters:  ?03/10/22 (!) 307 lb 6.4 oz (139.4 kg)  ?01/15/22 (!) 304 lb 9.6 oz (138.2 kg)  ?12/23/21 (!) 301 lb 12.8 oz (136.9 kg)  ?  ? ? ?ASSESSMENT AND PLAN: ? ?1  Dypsnea.   The pt has chronic SOB  She did not have CPX   She is due for sleep study   I encouraged her to get this    Recomm she stay actvie    ? ?2  HTN  Will set up to get BP cuff at home    ? ?3  Hashimotos'   Refer to [place] for continued care ? ?4  ? Sleep apnea   Pt with sleep disruption, breathing complaints    I agree with plans by PCP for sleep study ? ?5  Morbid obesity   Pt says she tried Keto diet in past     Lost about 30 lb   had urinary infection and stopped   has not resumed ?With consult to Dr Summer Fetch, further discussion ? ? ?Current medicines are reviewed at length with the patient today.  The patient does not have concerns regarding medicines. ? ?Signed, ?Summer Carnes, MD  ?03/10/2022 8:53 AM    ?[facility] ?[place], [place], [place]  [address] ?Phone: [phone]; Fax: [phone]  ? ? ?

## 2022-03-23 ENCOUNTER — Other Ambulatory Visit (HOSPITAL_COMMUNITY)
Admission: RE | Admit: 2022-03-23 | Discharge: 2022-03-23 | Disposition: A | Discharge status: Home / Self Care | Payer: 59 | Source: Other Acute Inpatient Hospital | Attending: Nurse Practitioner | Admitting: Nurse Practitioner

## 2022-03-23 ENCOUNTER — Encounter: Payer: Self-pay | Admitting: Nurse Practitioner

## 2022-03-23 ENCOUNTER — Ambulatory Visit (INDEPENDENT_AMBULATORY_CARE_PROVIDER_SITE_OTHER): Payer: 59 | Admitting: Nurse Practitioner

## 2022-03-23 VITALS — BP 138/62 | HR 106 | Temp 98.4°F | Wt 304.2 lb

## 2022-03-23 DIAGNOSIS — R319 Hematuria, unspecified: Secondary | ICD-10-CM | POA: Diagnosis not present

## 2022-03-23 DIAGNOSIS — K625 Hemorrhage of anus and rectum: Secondary | ICD-10-CM | POA: Diagnosis not present

## 2022-03-23 DIAGNOSIS — R1084 Generalized abdominal pain: Secondary | ICD-10-CM | POA: Diagnosis not present

## 2022-03-23 DIAGNOSIS — R300 Dysuria: Secondary | ICD-10-CM

## 2022-03-23 DIAGNOSIS — R109 Unspecified abdominal pain: Secondary | ICD-10-CM | POA: Diagnosis present

## 2022-03-23 LAB — CBC WITH DIFFERENTIAL/PLATELET
Abs Immature Granulocytes: 0.04 10*3/uL (ref 0.00–0.07)
Basophils Absolute: 0.1 10*3/uL (ref 0.0–0.1)
Basophils Relative: 1 %
Eosinophils Absolute: 0.2 10*3/uL (ref 0.0–0.5)
Eosinophils Relative: 3 %
HCT: 49 % — ABNORMAL HIGH (ref 36.0–46.0)
Hemoglobin: 16.5 g/dL — ABNORMAL HIGH (ref 12.0–15.0)
Immature Granulocytes: 1 %
Lymphocytes Relative: 31 %
Lymphs Abs: 2.6 10*3/uL (ref 0.7–4.0)
MCH: 30.2 pg (ref 26.0–34.0)
MCHC: 33.7 g/dL (ref 30.0–36.0)
MCV: 89.6 fL (ref 80.0–100.0)
Monocytes Absolute: 0.6 10*3/uL (ref 0.1–1.0)
Monocytes Relative: 7 %
Neutro Abs: 4.8 10*3/uL (ref 1.7–7.7)
Neutrophils Relative %: 57 %
Platelets: 294 10*3/uL (ref 150–400)
RBC: 5.47 MIL/uL — ABNORMAL HIGH (ref 3.87–5.11)
RDW: 14.6 % (ref 11.5–15.5)
WBC: 8.4 10*3/uL (ref 4.0–10.5)
nRBC: 0 % (ref 0.0–0.2)

## 2022-03-23 LAB — COMPREHENSIVE METABOLIC PANEL
ALT: 23 U/L (ref 0–44)
AST: 29 U/L (ref 15–41)
Albumin: 4.1 g/dL (ref 3.5–5.0)
Alkaline Phosphatase: 83 U/L (ref 38–126)
Anion gap: 8 (ref 5–15)
BUN: 15 mg/dL (ref 6–20)
CO2: 26 mmol/L (ref 22–32)
Calcium: 9 mg/dL (ref 8.9–10.3)
Chloride: 103 mmol/L (ref 98–111)
Creatinine, Ser: 0.72 mg/dL (ref 0.44–1.00)
GFR, Estimated: >60 mL/min (ref >60)
Glucose, Bld: 126 mg/dL — ABNORMAL HIGH (ref 70–99)
Potassium: 3.8 mmol/L (ref 3.5–5.1)
Sodium: 137 mmol/L (ref 135–145)
Total Bilirubin: 1 mg/dL (ref 0.3–1.2)
Total Protein: 7.3 g/dL (ref 6.5–8.1)

## 2022-03-23 LAB — POCT URINALYSIS DIPSTICK
Spec Grav, UA: 1.015 (ref 1.010–1.025)
pH, UA: 6.5 (ref 5.0–8.0)

## 2022-03-23 LAB — AMYLASE: Amylase: 55 U/L (ref 28–100)

## 2022-03-23 LAB — LIPASE, BLOOD: Lipase: 27 U/L (ref 11–51)

## 2022-03-23 MED ORDER — SULFAMETHOXAZOLE-TRIMETHOPRIM 800-160 MG PO TABS: 1.0000 | ORAL_TABLET | Freq: Two times a day (BID) | ORAL | 0 refills | Status: DC

## 2022-03-23 NOTE — Progress Notes (Signed)
Subjective:    Patient ID: Summer [name], female    DOB: 1961/03/14, 61 y.o.   MRN: [id number]  HPI  61 year old female present to the clinic to day for flank pain on both sides, burning before urination x 1-2 days, rectal bleeding when wiping and blood in urine that started yesterday (yesterday blood was pink; this morning blood was brighter pink), nausea and abdominal pain. Pt had colonoscopy 2 months ago. Has been trying to increase water intake. Patient denies fever, chills, body aches.  Colonoscopy that was done on 11/24/2021 that noted internal and external hemorrhoids and 6 sessile polyps that were removed.    Review of Systems  Gastrointestinal:  Positive for abdominal pain, anal bleeding, nausea and vomiting.  Genitourinary:  Positive for dysuria, flank pain and hematuria.  All other systems reviewed and are negative.     Objective:   Physical Exam Vitals reviewed.  Constitutional:      General: She is not in acute distress.    Appearance: Normal appearance. She is obese. She is not ill-appearing, toxic-appearing or diaphoretic.  HENT:     Head: Normocephalic and atraumatic.  Cardiovascular:     Rate and Rhythm: Normal rate and regular rhythm.     Pulses: Normal pulses.     Heart sounds: Normal heart sounds. No murmur heard. Pulmonary:     Effort: Pulmonary effort is normal.     Breath sounds: Normal breath sounds.  Abdominal:     General: Abdomen is flat. Bowel sounds are normal. There is no distension.     Palpations: Abdomen is soft. There is no mass.     Tenderness: There is generalized abdominal tenderness. There is left CVA tenderness. There is no right CVA tenderness, guarding or rebound.     Hernia: No hernia is present.     Comments: Difficulty palpating for masses due to patient's habitus  Musculoskeletal:     Comments: Grossly intact  Skin:    General: Skin is warm.     Capillary Refill: Capillary refill takes less than 2 seconds.  Neurological:      Mental Status: She is alert.     Comments: Grossly intact  Psychiatric:        Mood and Affect: Mood normal.        Behavior: Behavior normal.          Assessment & Plan:   1. Dysuria - Suspect UTI. - POCT Urinalysis Dipstick = negative however will send urine for culture and treat prophylactically.  - Bactrim BS BID for 3 days. - RTC if symptoms do not get better with in 2-3 days of abx use.   2. Hematuria, unspecified type - Bactrium BS BID for 3 days - Urine Culture - RTC if symptoms do not get better withi n 2-3 days of abx use.  3. Generalized abdominal pain - Unsure of etiology at this time.  - Will investigate pancreatic enyzymes, CBC, and CMP for elevated liver enzymes. - Amylase - CBC with Differential/Platelet - Lipase - COMPLETE METABOLIC PANEL WITH GFR - RTC if symptoms worsen or do not improve  4. Rectal bleeding - Likely hemrrhoids. - Use of the counter Miralax to prevent straining. - RTC if symptoms worsen or do not improve    Note:  This document was prepared using Dragon voice recognition software and may include unintentional dictation errors. Note - This record has been created using [employer].  Chart creation errors have been sought, but may  not always  have been located. Such creation errors do not reflect on  the standard of medical care.

## 2022-03-24 ENCOUNTER — Ambulatory Visit (INDEPENDENT_AMBULATORY_CARE_PROVIDER_SITE_OTHER): Payer: 59 | Admitting: Nurse Practitioner

## 2022-03-24 ENCOUNTER — Encounter: Payer: Self-pay | Admitting: Nurse Practitioner

## 2022-03-24 VITALS — BP 134/74 | HR 106 | Temp 95.5°F | Wt 302.2 lb

## 2022-03-24 DIAGNOSIS — K625 Hemorrhage of anus and rectum: Secondary | ICD-10-CM

## 2022-03-24 DIAGNOSIS — R310 Gross hematuria: Secondary | ICD-10-CM | POA: Diagnosis not present

## 2022-03-24 DIAGNOSIS — N950 Postmenopausal bleeding: Secondary | ICD-10-CM

## 2022-03-24 LAB — HEMOCCULT GUIAC POC 1CARD (OFFICE): Fecal Occult Blood, POC: POSITIVE — AB

## 2022-03-24 MED ORDER — SULFAMETHOXAZOLE-TRIMETHOPRIM 800-160 MG PO TABS
1.0000 | ORAL_TABLET | Freq: Two times a day (BID) | ORAL | 0 refills | Status: DC
Start: 2022-03-24 — End: 2022-08-24

## 2022-03-24 NOTE — Progress Notes (Signed)
Subjective:    Patient ID: Summer [name], female    DOB: [birth year], 60 y.o.   MRN: [id number]  HPI  61 year old female returns to the clinic after being seen yesterday for dysuria and blood in her urine presents to the clinic today with complaints of increased hematuria, vaginal bleeding, and rectal bleeding.  Patient states that she experienced severe back pain yesterday that woke her up around 11:00 and that she has been bleeding and passing small clots.    Patient states that she has been using Tylenol and ibuprofen for back pain which has not been helpful.  Patient states that she feels that the blood has slowed down a bit but she is still bleeding into a pad.   Patient had stat labs yesterday.  Review of Systems  Gastrointestinal:  Positive for anal bleeding.  Genitourinary:  Positive for dysuria, flank pain, frequency, hematuria and vaginal bleeding.      Objective:   Physical Exam Vitals reviewed. Exam conducted with a chaperone present.  Constitutional:      General: She is not in acute distress.    Appearance: Normal appearance. She is obese. She is not ill-appearing, toxic-appearing or diaphoretic.  HENT:     Head: Normocephalic and atraumatic.  Cardiovascular:     Rate and Rhythm: Normal rate and regular rhythm.     Pulses: Normal pulses.     Heart sounds: Normal heart sounds. No murmur heard. Pulmonary:     Effort: Pulmonary effort is normal. No respiratory distress.     Breath sounds: Normal breath sounds. No wheezing.  Abdominal:     General: Abdomen is flat. There is no distension.     Palpations: Abdomen is soft. There is no mass.     Tenderness: There is abdominal tenderness in the suprapubic area. There is left CVA tenderness. There is no right CVA tenderness, guarding or rebound. Negative signs include Murphy's sign, Rovsing's sign and McBurney's sign.     Hernia: No hernia is present. There is no hernia in the left inguinal area or right inguinal area.   Genitourinary:    Exam position: Lithotomy position.     Labia:        Right: No rash, tenderness, lesion or injury.        Left: No rash, tenderness, lesion or injury.      Urethra: No prolapse, urethral pain, urethral swelling or urethral lesion.     Vagina: No signs of injury and foreign body. Tenderness and bleeding present. No vaginal discharge, erythema, lesions or prolapsed vaginal walls.     Cervix: Cervical bleeding present. No cervical motion tenderness, discharge, friability, lesion or erythema.     Uterus: Tender.      Rectum: Guaiac result positive. Tenderness and external hemorrhoid present. No mass, anal fissure or internal hemorrhoid. Normal anal tone.     Comments: Unable to fully assess uterus due to habitus.  Tenderness noted to lower abdomen. unable to assess adnexa due to habitus.  Blood noted on examination finger during anal exam.  No abnormality seen to the urethra. Musculoskeletal:     Cervical back: Normal range of motion and neck supple. No rigidity or tenderness.     Comments: Grossly intact  Lymphadenopathy:     Cervical: No cervical adenopathy.     Lower Body: No right inguinal adenopathy. No left inguinal adenopathy.  Skin:    General: Skin is warm.     Capillary Refill: Capillary refill takes less than 2 seconds.  Neurological:     Mental Status: She is alert.     Comments: Grossly intact  Psychiatric:        Mood and Affect: Mood normal.        Behavior: Behavior normal.       Assessment & Plan:   1. Postmenopausal bleeding -Etiology of postmenopausal bleeding unknown at this time. -We will evaluate patient with transvaginal ultrasound since patient is postmenopausal -Hemoccult testing positive - US Pelvic Complete With Transvaginal, stat - Ambulatory referral to Gynecology, urgent -If bleeding persist or worsens or back pain worsens go to the emergency room  2. Rectal bleeding -Etiology of rectal bleeding unknown at this time.  Possibly  hemorrhoids -However rectal bleeding coupled with postmenopausal bleeding mildly concerning. -CBC drawn yesterday stable.  No anemia present - CT Abdomen Pelvis W Contrast, stat -Lipase amylase all within normal limits -Liver enzymes within normal limits -If symptoms worsen go to the emergency room  3. Gross hematuria -Etiology of hematuria unknown at this time.  -Although presence of gross hematuria is possibly obscured by vaginal bleeding and rectal bleeding, suspect UTI however also considering bladder malignancies -Point-of-care urinalysis positive for frank blood - CT Abdomen Pelvis W Contrast, stat - sulfamethoxazole-trimethoprim (BACTRIM DS) 800-160 MG tablet; Take 1 tablet by mouth 2 (two) times daily for 2 days.  Dispense: 4 tablet; Refill: 0 -Continue taking Bactrim for 3 days.  If symptoms not better may continue for another 2 days -If symptoms worsen go to the emergency room    Note:  This document was prepared using Dragon voice recognition software and may include unintentional dictation errors. Note - This record has been created using [employer].  Chart creation errors have been sought, but may not always  have been located. Such creation errors do not reflect on  the standard of medical care.

## 2022-03-25 ENCOUNTER — Ambulatory Visit (HOSPITAL_BASED_OUTPATIENT_CLINIC_OR_DEPARTMENT_OTHER)
Admission: RE | Admit: 2022-03-25 | Discharge: 2022-03-25 | Disposition: A | Discharge status: Home / Self Care | Payer: 59 | Source: Ambulatory Visit | Attending: Nurse Practitioner | Admitting: Nurse Practitioner

## 2022-03-25 DIAGNOSIS — R310 Gross hematuria: Secondary | ICD-10-CM | POA: Diagnosis present

## 2022-03-25 DIAGNOSIS — K625 Hemorrhage of anus and rectum: Secondary | ICD-10-CM | POA: Insufficient documentation

## 2022-03-26 ENCOUNTER — Ambulatory Visit: Payer: 59 | Admitting: Allergy & Immunology

## 2022-03-26 ENCOUNTER — Encounter: Payer: Self-pay | Admitting: Nurse Practitioner

## 2022-03-26 LAB — URINE CULTURE

## 2022-03-26 LAB — SPECIMEN STATUS REPORT

## 2022-03-26 NOTE — Addendum Note (Signed)
Addended by: Dairl Ponder on: 03/26/2022 10:38 AM   Modules accepted: Orders

## 2022-03-30 ENCOUNTER — Encounter: Payer: Self-pay | Admitting: Family Medicine

## 2022-03-30 ENCOUNTER — Ambulatory Visit (INDEPENDENT_AMBULATORY_CARE_PROVIDER_SITE_OTHER): Payer: 59 | Admitting: Family Medicine

## 2022-03-30 VITALS — BP 132/76 | HR 72 | Temp 98.2°F | Ht 65.0 in | Wt 305.8 lb

## 2022-03-30 DIAGNOSIS — R319 Hematuria, unspecified: Secondary | ICD-10-CM | POA: Diagnosis not present

## 2022-03-30 DIAGNOSIS — R0609 Other forms of dyspnea: Secondary | ICD-10-CM | POA: Diagnosis not present

## 2022-03-30 DIAGNOSIS — J449 Chronic obstructive pulmonary disease, unspecified: Secondary | ICD-10-CM | POA: Diagnosis not present

## 2022-03-30 DIAGNOSIS — D582 Other hemoglobinopathies: Secondary | ICD-10-CM

## 2022-03-30 LAB — POCT URINALYSIS DIPSTICK
Bilirubin, UA: NEGATIVE
Glucose, UA: NEGATIVE
Ketones, UA: NEGATIVE
Leukocytes, UA: NEGATIVE
Nitrite, UA: NEGATIVE
Protein, UA: NEGATIVE
Spec Grav, UA: 1.015 (ref 1.010–1.025)
Urobilinogen, UA: 0.2 E.U./dL
pH, UA: 6 (ref 5.0–8.0)

## 2022-03-30 MED ORDER — ALPRAZOLAM 0.5 MG PO TABS: 0.5000 mg | ORAL_TABLET | Freq: Two times a day (BID) | ORAL | 0 refills | Status: DC | PRN

## 2022-03-30 MED ORDER — ROSUVASTATIN CALCIUM 5 MG PO TABS: 5.0000 mg | ORAL_TABLET | Freq: Every day | ORAL | 5 refills | Status: DC

## 2022-03-30 MED ORDER — THYROID 60 MG PO TABS: ORAL_TABLET | ORAL | 5 refills | Status: DC

## 2022-03-30 NOTE — Patient Instructions (Addendum)
You need to avoid all smoke Avoid second hand smoke Due to emphysema/copd on scans and tests  This would help you and Mikki Santee

## 2022-03-30 NOTE — Progress Notes (Signed)
[facility]   Subjective:    Patient ID: Summer [name], female    DOB: 09/08/1961, 61 y.o.   MRN: [id number]  HPI  Patient here for blood in urine.  Patient was seen last week in office.  She states she is recently had rectal bleeding vaginal bleeding and hematuria.  She was concerned.  Here today for a quick follow-up denies high fever chills denies severe abdominal pain  Review of Systems     Objective:   Physical Exam  General-in no acute distress Eyes-no discharge Lungs-respiratory rate normal, CTA CV-no murmurs,RRR Extremities skin warm dry no edema Neuro grossly normal Behavior normal, alert       Assessment & Plan:   1. Hematuria, unspecified type No hematuria seen on today's exam Has appointment being made by referral for urology  - POCT urinalysis dipstick  2. DOE (dyspnea on exertion) Breathing issues recommend consultation with pulmonary Patient to avoid all secondhand smoke. - Ambulatory referral to Pulmonology Portion control regular activity for morbid obesity Elevated hemoglobin secondhand smoke does not help avoid it Underlying COPD emphysema avoid all secondhand smoke

## 2022-04-05 ENCOUNTER — Ambulatory Visit (INDEPENDENT_AMBULATORY_CARE_PROVIDER_SITE_OTHER): Payer: 59 | Admitting: Urology

## 2022-04-05 ENCOUNTER — Encounter: Payer: Self-pay | Admitting: Urology

## 2022-04-05 VITALS — BP 155/89 | HR 105 | Ht 65.0 in | Wt 305.0 lb

## 2022-04-05 DIAGNOSIS — R310 Gross hematuria: Secondary | ICD-10-CM | POA: Diagnosis not present

## 2022-04-05 LAB — URINALYSIS, ROUTINE W REFLEX MICROSCOPIC
Bilirubin, UA: NEGATIVE
Glucose, UA: NEGATIVE
Leukocytes,UA: NEGATIVE
Nitrite, UA: NEGATIVE
RBC, UA: NEGATIVE
Specific Gravity, UA: 1.02 (ref 1.005–1.030)
Urobilinogen, Ur: 0.2 mg/dL (ref 0.2–1.0)
pH, UA: 7.5 (ref 5.0–7.5)

## 2022-04-05 NOTE — Progress Notes (Unsigned)
04/05/2022 3:15 PM   [name] [birth year] [id number]  Referring provider: Kathyrn Drown, MD [address],  [place] [address]  No chief complaint on file.   HPI:  New patient-  1) gross hematuria-patient developed gross hematuria. She had red urine. She noted this May 2023. She tried an abx. She underwent CT scan of the abdomen and pelvis May 2023 which was benign.  There was a 5.7 cm right renal cyst. She had back pain and has it today. UA clear. She quit smoking 18 yrs ago. She may have had some vaginal bleeding.  She has a pelvic ultrasound ordered. She has occasional urgency and leakage of urine. She had blood in her stool and c-scope showed 11 polyps recently.   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     PMH: Past Medical History:  Diagnosis Date   Arthritis    Bell's palsy    Dysrhythmia, cardiac    Fibromyalgia    Hashimoto's disease    History of CT scan 06/2017   "coronary CT scan on 06/01/2017, this did not find any cardiac calcifications. Coronary calcium score of zero."   Hypertension    Thyroid disease    Urticaria     Surgical History: Past Surgical History:  Procedure Laterality Date   BIOPSY  11/24/2021   Procedure: BIOPSY;  Surgeon: Harvel Quale, MD;  [facility];  Service: Gastroenterology;;   COLONOSCOPY N/A 01/23/2014   Procedure: COLONOSCOPY;  Surgeon: Rogene Houston, MD;  [facility]: [facility];  Service: Endoscopy;  Laterality: N/A;  200   COLONOSCOPY WITH PROPOFOL N/A 11/24/2021   Procedure: COLONOSCOPY WITH PROPOFOL;  Surgeon: Harvel Quale, MD;  Location: [facility];  Service: Gastroenterology;  Laterality: N/A;  205   ESOPHAGOGASTRODUODENOSCOPY (EGD) WITH PROPOFOL N/A 11/24/2021   Procedure: ESOPHAGOGASTRODUODENOSCOPY (EGD) WITH PROPOFOL;  Surgeon: Harvel Quale, MD;  [facility]: [facility];  Service: Gastroenterology;  Laterality: N/A;    POLYPECTOMY  11/24/2021   Procedure: POLYPECTOMY;  Surgeon: Harvel Quale, MD;  [facility]: [facility];  Service: Gastroenterology;;   TONSILLECTOMY      Home Medications:  Allergies as of 04/05/2022       Reactions   Molds & Smuts Shortness Of Breath   Avelox [moxifloxacin Hcl In Nacl] Other (See Comments)   "heart beats weird"   Bee Venom Hives   Shellfish Allergy Hives   Tape Rash        Medication List        Accurate as of April 05, 2022  3:15 PM. If you have any questions, ask your nurse or doctor.          albuterol (2.5 MG/3ML) 0.083% nebulizer solution Commonly known as: PROVENTIL Take 3 mLs (2.5 mg total) by nebulization every 4 (four) hours as needed for wheezing.   albuterol 108 (90 Base) MCG/ACT inhaler Commonly known as: VENTOLIN HFA INHALE TWO PUFFS BY MOUTH EVERY 4 HOURS INTO  THE  LUNGS  AS  NEEDED   ALPRAZolam 0.5 MG tablet Commonly known as: XANAX Take 1 tablet (0.5 mg total) by mouth 2 (two) times daily as needed for anxiety.   cetirizine 10 MG tablet Commonly known as: ZYRTEC Take 10 mg by mouth 2 (two) times daily.   famotidine 20 MG tablet Commonly known as: PEPCID Take 20 mg by mouth 2 (two) times daily.   furosemide 20 MG tablet Commonly known as:  LASIX TAKE 2 TABLETS BY MOUTH IN THE MORNING AS NEEDED   HYDROcodone-acetaminophen 5-325 MG tablet Commonly known as: NORCO/VICODIN Take 1 tablet by mouth every 6 (six) hours as needed.   ibuprofen 200 MG tablet Commonly known as: ADVIL Take 400 mg by mouth every 6 (six) hours as needed.   Magnesium 200 MG Tabs Take 1 tablet by mouth daily.   montelukast 10 MG tablet Commonly known as: Singulair Take 1 tablet (10 mg total) by mouth at bedtime.   OLIVE LEAF PO Take 1 tablet by mouth daily.   OVER THE COUNTER MEDICATION Red root   potassium chloride SA 20 MEQ tablet Commonly known as: KLOR-CON M Take 1 tablet by mouth once daily   rosuvastatin 5 MG tablet Commonly  known as: Crestor Take 1 tablet (5 mg total) by mouth daily.   SUPER B COMPLEX PO Take by mouth.   thyroid 60 MG tablet Commonly known as: NP Thyroid TAKE 1 TABLET BY MOUTH ONCE DAILY BEFORE BREAKFAST   Vitamin C 500 MG Caps Take 1 capsule by mouth 2 (two) times daily.   Vitamin D-3 125 MCG (5000 UT) Tabs Take 1 tablet by mouth daily.        Allergies:  Allergies  Allergen Reactions   Molds & Smuts Shortness Of Breath   Avelox [Moxifloxacin Hcl In Nacl] Other (See Comments)    "heart beats weird"   Bee Venom Hives   Shellfish Allergy Hives   Tape Rash    Family History: Family History  Adopted: Yes  Problem Relation Age of Onset   Cancer Mother        lung   Other Father    Other Daughter        Lyme's disease    Social History:  reports that she quit smoking about 18 years ago. Her smoking use included cigarettes. She has a 20.00 pack-year smoking history. She has never used smokeless tobacco. She reports that she does not drink alcohol and does not use drugs.   Physical Exam: BP (!) 155/89   Pulse (!) 105   Ht '5\' 5"'$  (1.651 m)   Wt (!) 305 lb (138.3 kg)   LMP 01/23/2014 Comment: spotting  BMI 50.75 kg/m   Constitutional:  Alert and oriented, No acute distress. HEENT: [place] AT, moist mucus membranes.  Trachea midline, no masses. Cardiovascular: No clubbing, cyanosis, or edema. Respiratory: Normal respiratory effort, no increased work of breathing. GI: Abdomen is soft, nontender, nondistended, no abdominal masses GU: No CVA tenderness Skin: No rashes, bruises or suspicious lesions. Neurologic: Grossly intact, no focal deficits, moving all 4 extremities. Psychiatric: Normal mood and affect.  Laboratory Data: Lab Results  Component Value Date   WBC 8.4 03/23/2022   HGB 16.5 (H) 03/23/2022   HCT 49.0 (H) 03/23/2022   MCV 89.6 03/23/2022   PLT 294 03/23/2022    Lab Results  Component Value Date   CREATININE 0.72 03/23/2022    No results found for:  PSA  No results found for: TESTOSTERONE  Lab Results  Component Value Date   HGBA1C 6.2 (H) 04/03/2021    Urinalysis    Component Value Date/Time   COLORURINE YELLOW 10/30/2020 2036   APPEARANCEUR HAZY (A) 10/30/2020 2036   APPEARANCEUR Clear 10/30/2020 1521   LABSPEC 1.019 10/30/2020 2036   PHURINE 5.0 10/30/2020 2036   GLUCOSEU Negative 11/14/2020 [place] 10/30/2020 2036   HGBUR MODERATE (A) 10/30/2020 2036   BILIRUBINUR Negative 03/30/2022 1110  BILIRUBINUR Negative 10/30/2020 1521   KETONESUR 80 (A) 10/30/2020 2036   PROTEINUR Negative 03/30/2022 1110   PROTEINUR NEGATIVE 10/30/2020 2036   UROBILINOGEN 0.2 03/30/2022 1110   UROBILINOGEN 0.2 11/09/2013 0446   NITRITE Negative 03/30/2022 1110   NITRITE NEGATIVE 10/30/2020 2036   LEUKOCYTESUR Negative 03/30/2022 [place] 10/30/2020 2036    Lab Results  Component Value Date   MUCUS 0 08/06/2013   BACTERIA NONE SEEN 10/30/2020    Pertinent Imaging: CT images 2023    Assessment & Plan:    1. Gross hematuria CT scan was benign.  We discussed the nature risk benefits and alternatives to cystoscopy and she will follow-up for that.  We discussed to make sure she follows up for her transvaginal ultrasound as this will evaluate her uterus and ovaries.  - Urinalysis, Routine w reflex microscopic   No follow-ups on file.  Festus Aloe, MD  [facility]  [address] [place], [place] [address] [phone]

## 2022-04-06 ENCOUNTER — Ambulatory Visit (INDEPENDENT_AMBULATORY_CARE_PROVIDER_SITE_OTHER): Payer: 59 | Admitting: Gastroenterology

## 2022-04-06 ENCOUNTER — Encounter (INDEPENDENT_AMBULATORY_CARE_PROVIDER_SITE_OTHER): Payer: Self-pay | Admitting: Gastroenterology

## 2022-04-06 VITALS — BP 143/103 | HR 99 | Temp 98.0°F | Ht 65.0 in | Wt 302.9 lb

## 2022-04-06 DIAGNOSIS — R1030 Lower abdominal pain, unspecified: Secondary | ICD-10-CM | POA: Diagnosis not present

## 2022-04-06 DIAGNOSIS — K649 Unspecified hemorrhoids: Secondary | ICD-10-CM

## 2022-04-06 DIAGNOSIS — K219 Gastro-esophageal reflux disease without esophagitis: Secondary | ICD-10-CM

## 2022-04-06 DIAGNOSIS — K5900 Constipation, unspecified: Secondary | ICD-10-CM

## 2022-04-06 DIAGNOSIS — K625 Hemorrhage of anus and rectum: Secondary | ICD-10-CM | POA: Diagnosis not present

## 2022-04-06 MED ORDER — HYDROCORTISONE (PERIANAL) 2.5 % EX CREA: 1.0000 "application " | TOPICAL_CREAM | Freq: Three times a day (TID) | CUTANEOUS | 1 refills | Status: DC

## 2022-04-06 NOTE — Patient Instructions (Signed)
As we discussed, repeating your colonoscopy in 3 years is the recommendation based on guidelines and studies done, the likelihood of you developing a colon cancer in that time frame is very, very rare.  Given that your liver function, pancreatic enzymes and recent CT of the abdomen were all normal, I am not concerned for an obstruction causing your orange stools, I suspect this is related to dietary intake.  In regards to rectal bleeding, with recent colonoscopy in January and presence of both internal and external hemorrhoids bleeding is likely secondary to these/constipation.  -make sure you are drinking plenty of water, atleast eight 8 oz glasses per day.  -Limit toilet time and straining as this puts more pressure on the hemorrhoids.  -Make sure diet is high in fruits, veggies and whole grains, kiwi and prunes are really good for constipation.  -I am sending anusol cream to use, apply three times per day x10 days then as needed thereafter, this will help to shrink the hemorrhoids. - if you are interested in hemorrhoid banding for the internal hemorrhoids, we can set you up for hemorrhoid banding which places a band on the lesion and shrinks it down until it goes away. You can let us know if you are interested, we will be doing this procedure here on July 13th.   -for softer stools, Start taking Miralax 1 capful every day for one week. If bowel movements do not improve, increase to 1 capful every 12 hours. If after two weeks there is no improvement, increase to 1 capful every 8 hours  Please continue your famotidine twice a day, make sure you are avoiding greasy, fried, tomato based, spicy foods as well as caffeine chocolate and alcohol as these can worsen reflux symptoms.   You may benefit from a probiotic (culturelle, florastor are both good) most importantly I prefer there to be atleast 2-3 different strains in the probiotic for it to be most beneficial  Follow up 6 months

## 2022-04-06 NOTE — Progress Notes (Unsigned)
Referring Provider: Kathyrn Drown, MD Primary Care Physician:  Kathyrn Drown, MD Primary GI Physician: Jenetta Downer  Chief Complaint  Patient presents with   Rectal Bleeding    Patient here today after seeing bright red blood and a couple of weeks ago, which lasted 5-6 days. She says she has not seen any since. Also had lower back pain.She says she was seeing blood rectally,vaginally and she was peeing blood. She took Glandorf and the pain, and the bleeding stopped. Was told the the UA was negative for infection. Stools have been orange for over a year.   HPI:   Summer [name] is a 61 y.o. female with past medical history of  heart failure (normal EF July 2022), Fibromyalgia, Bell's palsy, hypothyroidism, HTN.   Patient presenting today for rectal bleeding.  History: Last seen 10/09/21, at that time she reported daily reflux despite omeprazole '40mg'$  daily, she stopped omeprazole, started zyrtec and pepcid BID with improvement. She reported epigastric pain, burning in throat, occasional dysphagia, nausea. Previously having rectal bleeding though this had resolved at last visit. She was recommended to have EGD and Colonoscopy in October 2022 but was advised by her PCP to cancel these as she needed cardiac clearance prior to. Recommended to proceed with EGD and colonoscopy at last visit, advised to continue pepcid '20mg'$  BID, Rx carafate 1g QID. She is having 2-3 BMs per day, stools can be harder at times and cause some pain when she defecates. She states she does not drink a lot of water. Has salads 3-4x/week.    Present: Most recent hgb 16.5, LFTs WNL, amylase and lipase normal on 03/23/22.  Hemoccult on 03/25/23 positive  Today, patient states that she had presence of rectal bleeding about 2 weeks ago. She states that she was having rectal bleeding, vaginal bleeding and hematuria. She states that she was started on biaxin and took this for 3 days, bleeding then resolved. She had a rectal exam at  her PCP and they noted quite a few hemorrhoids at that time. She reports that she has orange stools that have been ongoing for the past year. She endorses some LUQ pain and nausea intermittently. Stools continue to be hard at times and require straining to defecate. Denies melena. Recent CT A/P w/o contrast on 03/25/22 that was unremarkable.   States that reflux is doing pretty good since switching to the pepcid '20mg'$  BID. she was previously on omeprazole which did not provide much relief of her symptoms. She states that she had tried to wean off of this but did not do well without it. Appetite is not great, she has lost a few pounds. She states that a lot of times when she eats certain foods, she has abdominal discomfort, usually LUQ.  She has recently started avoiding gluten and sugar, though it has only been a few days so she is unsure of results yet.   Last Colonoscopy:11/24/21- Hemorrhoids found on perianal exam. - Six 3 to 8 mm polyps in the transverse colon, in the ascending colon and in the cecum, - Two 1 to 2 mm polyps in the transverse colon - Medium-sized lipoma in the transverse colon. - Three 3 to 5 mm polyps in the rectum and in the sigmoid colon - The entire examined colon is normal. Biopsied-normal - Internal hemorrhoids. (total of 9 tubular adenomas, one submucosal lipoma and 1 hyperplastic polyp) Last Endoscopy:11/24/21 2 cm hiatal hernia. - Normal stomach. Biopsied-normal - Normal examined duodenum. Biopsied-normal  Recommendations:  Repeat colonoscopy in Jan 2026  Past Medical History:  Diagnosis Date   Arthritis    Bell's palsy    Dysrhythmia, cardiac    Fibromyalgia    Hashimoto's disease    History of CT scan 06/2017   "coronary CT scan on 06/01/2017, this did not find any cardiac calcifications. Coronary calcium score of zero."   Hypertension    Thyroid disease    Urticaria     Past Surgical History:  Procedure Laterality Date   BIOPSY  11/24/2021   Procedure:  BIOPSY;  Surgeon: Harvel Quale, MD;  [facility];  Service: Gastroenterology;;   COLONOSCOPY N/A 01/23/2014   Procedure: COLONOSCOPY;  Surgeon: Rogene Houston, MD;  Location: [facility];  Service: Endoscopy;  Laterality: N/A;  200   COLONOSCOPY WITH PROPOFOL N/A 11/24/2021   Procedure: COLONOSCOPY WITH PROPOFOL;  Surgeon: Harvel Quale, MD;  Location: [facility];  Service: Gastroenterology;  Laterality: N/A;  205   ESOPHAGOGASTRODUODENOSCOPY (EGD) WITH PROPOFOL N/A 11/24/2021   Procedure: ESOPHAGOGASTRODUODENOSCOPY (EGD) WITH PROPOFOL;  Surgeon: Harvel Quale, MD;  [facility]: [facility];  Service: Gastroenterology;  Laterality: N/A;   POLYPECTOMY  11/24/2021   Procedure: POLYPECTOMY;  Surgeon: Montez Morita, Quillian Quince, MD;  Location: [facility];  Service: Gastroenterology;;   TONSILLECTOMY      Current Outpatient Medications  Medication Sig Dispense Refill   albuterol (PROVENTIL) (2.5 MG/3ML) 0.083% nebulizer solution Take 3 mLs (2.5 mg total) by nebulization every 4 (four) hours as needed for wheezing. 75 mL 11   albuterol (VENTOLIN HFA) 108 (90 Base) MCG/ACT inhaler INHALE TWO PUFFS BY MOUTH EVERY 4 HOURS INTO  THE  LUNGS  AS  NEEDED 18 g 3   ALPRAZolam (XANAX) 0.5 MG tablet Take 1 tablet (0.5 mg total) by mouth 2 (two) times daily as needed for anxiety. 20 tablet 0   Ascorbic Acid (VITAMIN C) 500 MG CAPS Take 1 capsule by mouth 2 (two) times daily.      B Complex-C (SUPER B COMPLEX PO) Take by mouth daily at 6 (six) AM.     cetirizine (ZYRTEC) 10 MG tablet Take 10 mg by mouth 2 (two) times daily.     Cholecalciferol (VITAMIN D-3) 5000 UNITS TABS Take 1 tablet by mouth daily.     famotidine (PEPCID) 20 MG tablet Take 20 mg by mouth 2 (two) times daily.     furosemide (LASIX) 20 MG tablet TAKE 2 TABLETS BY MOUTH IN THE MORNING AS NEEDED 60 tablet 5   ibuprofen (ADVIL) 200 MG tablet Take 400 mg by mouth every 6 (six) hours as needed.      Magnesium 200 MG TABS Take 1 tablet by mouth daily.     montelukast (SINGULAIR) 10 MG tablet Take 1 tablet (10 mg total) by mouth at bedtime. 30 tablet 5   OLIVE LEAF PO Take 1 tablet by mouth daily.     OVER THE COUNTER MEDICATION as needed. Red root     potassium chloride SA (KLOR-CON M) 20 MEQ tablet Take 1 tablet by mouth once daily 30 tablet 5   rosuvastatin (CRESTOR) 5 MG tablet Take 1 tablet (5 mg total) by mouth daily. 30 tablet 5   thyroid (NP THYROID) 60 MG tablet TAKE 1 TABLET BY MOUTH ONCE DAILY BEFORE BREAKFAST 30 tablet 5   HYDROcodone-acetaminophen (NORCO/VICODIN) 5-325 MG tablet Take 1 tablet by mouth every 6 (six) hours as needed. (Patient not taking: Reported on 04/06/2022)  No current facility-administered medications for this visit.    Allergies as of 04/06/2022 - Review Complete 04/06/2022  Allergen Reaction Noted   Molds & smuts Shortness Of Breath 11/20/2021   Avelox [moxifloxacin hcl in nacl] Other (See Comments) 11/22/2013   Bee venom Hives 09/06/2012   Shellfish allergy Hives 09/06/2012   Tape Rash 11/20/2021    Family History  Adopted: Yes  Problem Relation Age of Onset   Cancer Mother        lung   Other Father    Other Daughter        Lyme's disease    Social History   Socioeconomic History   Marital status: Married    Spouse name: Not on file   Number of children: Not on file   Years of education: Not on file   Highest education level: Not on file  Occupational History   Not on file  Tobacco Use   Smoking status: Former    Packs/day: 1.00    Years: 20.00    Pack years: 20.00    Types: Cigarettes    Quit date: 11/08/2003    Years since quitting: 18.4   Smokeless tobacco: Never   Tobacco comments:    quit smoking 10 yrs   Vaping Use   Vaping Use: Never used  Substance and Sexual Activity   Alcohol use: No   Drug use: No   Sexual activity: Yes    Birth control/protection: Post-menopausal  Other Topics Concern   Not on file   Social History Narrative   Not on file   Social Determinants of Health   Financial Resource Strain: Not on file  Food Insecurity: Not on file  Transportation Needs: Not on file  Physical Activity: Not on file  Stress: Not on file  Social Connections: Not on file   Review of systems General: negative for malaise, night sweats, fever, chills, weight loss Neck: Negative for lumps, goiter, pain and significant neck swelling Resp: Negative for cough, wheezing, dyspnea at rest CV: Negative for chest pain, leg swelling, palpitations, orthopnea GI: denies melena, vomiting, diarrhea, dysphagia, odyonophagia, early satiety or unintentional weight loss. +rectal bleeding +constipation +nausea MSK: Negative for joint pain or swelling, back pain, and muscle pain. Derm: Negative for itching or rash Psych: Denies depression, anxiety, memory loss, confusion. No homicidal or suicidal ideation.  Heme: Negative for prolonged bleeding, bruising easily, and swollen nodes. Endocrine: Negative for cold or heat intolerance, polyuria, polydipsia and goiter. Neuro: negative for tremor, gait imbalance, syncope and seizures. The remainder of the review of systems is noncontributory.  Physical Exam: BP (!) 143/103 (BP Location: Left Arm, Patient Position: Sitting, Cuff Size: Small)   Pulse 99   Temp 98 F (36.7 C) (Oral)   Ht '5\' 5"'$  (1.651 m)   Wt (!) 302 lb 14.4 oz (137.4 kg)   LMP 01/23/2014 Comment: spotting  BMI 50.41 kg/m  General:   Alert and oriented. No distress noted. Pleasant and cooperative.  Head:  Normocephalic and atraumatic. Eyes:  Conjuctiva clear without scleral icterus. Mouth:  Oral mucosa pink and moist. Good dentition. No lesions. Heart: Normal rate and rhythm, s1 and s2 heart sounds present.  Lungs: Clear lung sounds in all lobes. Respirations equal and unlabored. Abdomen:  +BS, soft, non-tender and non-distended. No rebound or guarding. No HSM or masses noted. Derm: No palmar  erythema or jaundice Msk:  Symmetrical without gross deformities. Normal posture. Extremities:  Without edema. Neurologic:  Alert and  oriented x4 Psych:  Alert and cooperative. Normal mood and affect.  Invalid input(s): 6 MONTHS   ASSESSMENT: MAUDEAN HOFFMANN is a 61 y.o. female presenting today for rectal bleeding.   Intermittent rectal bleeding in presence of known hemorrhoids and constipation. Recommended she Start taking Miralax 1 capful every day for one week. If bowel movements do not improve, increase to 1 capful every 12 hours. If after two weeks there is no improvement, increase to 1 capful every 8 hours, increase fruits, veggies and whole grains, as well as making sure she is drinking plenty of water. She should avoid straining and limit toilet time to prevent worsening of hemorrhoids, will send Rx anusol for hemorrhoids as well. Discussed potential hemorrhoid banding for known internal hemorrhoids, however, patient not interested at this time.   GERD well managed with pepcid BID, previously on PPI without good results.  she has no vomiting, dysphagia or odynophagia. Rare breakthrough reflux symptoms. Will continue regimen with good results. She has occasional nausea and LUQ pain, recent EGD with only 2cm Hiatal hernia, and CT A/P wo contrast at the end of May that was unremarkable. Suspect symptoms may still be related to GERD/functional dyspepsia. I recommended starting daily probiotic to see if this helps. She should continue reflux precautions to include staying upright 2-3 hours after eating, before sleep and benefit of blocks to elevate head of bed. Should be mindful of carbonated drinks/sodas or food that has tomatoes, spicy or greasy food, chocolate and caffeine, as these can worsen reflux symptoms. She may also benefit from a daily probiotic for her symptoms as well.    PLAN:  Increase water intake, diet high in fruits,v eggie and whole grains 2. Start taking Miralax  3. Anusol  cream TID 10 days 4. Probiotic daily 5. Pepcid '20mg'$  BID and reflux precautions 6. Limit toilet time and straining 7. Consider hemorrhoid banding for internal hemorrhoids  All questions were answered, patient verbalized understanding and is in agreement with plan as outlined above.    Follow Up: 6 months  Asal Teas L. Alver Sorrow, MSN, APRN, AGNP-C Adult-Gerontology Nurse Practitioner [facility]

## 2022-04-07 NOTE — Progress Notes (Signed)
Summer [name], female    DOB: [birth year]   MRN: [id number]   Brief patient profile:  33  yowf quit smoking 2005 with rhinitis as child in [place] never inhalers ? Better by HS p allergy shots for ? Sev years  then exp to black mold her late 14s sob recurrent rhinitis/bronchitis/ waiting list for section 8 with Ernst Bowler eval pos for mold referred to pulmonary clinic 04/08/2022 by Dr Wolfgang Phoenix for sob.          History of Present Illness  04/08/2022  Pulmonary/ 1st office eval/Summer [name]  Chief Complaint  Patient presents with   Follow-up    Follow up. Patient says she's been having chest pain, having a lot of shortness of breath, a lot of fatigue, and a cough.   Dyspnea:  sob at rest / chest feels tight, some  better with albuterol but hasn't used for sev days prior to OV  and technique is poor  Cough: dry  Sleep: on side/mattress is elevated some / overt gerd if tries to lie flat but says pepcid helps > ppi   No obvious patterns in day to day or daytime pattern/variability or assoc excess/ purulent sputum or mucus plugs or hemoptysis    Also denies any obvious fluctuation of symptoms with weather or environmental changes or other aggravating or alleviating factors except as outlined above   No unusual exposure hx or h/o childhood pna/ asthma or knowledge of premature birth.  Current Allergies, Complete Past Medical History, Past Surgical History, Family History, and Social History were reviewed in [employer] record.  ROS  The following are not active complaints unless bolded Hoarseness, sore throat, dysphagia, dental problems, itching, sneezing,  nasal congestion or discharge of excess mucus or purulent secretions, ear ache,   fever, chills, sweats, unintended wt loss or wt gain, classically pleuritic or exertional cp,  orthopnea pnd or arm/hand swelling  or leg swelling, presyncope, palpitations, abdominal pain, anorexia, nausea, vomiting, diarrhea  or change in bowel  habits or change in bladder habits, change in stools or change in urine, dysuria, hematuria,  rash, arthralgias, visual complaints, headache, numbness, weakness or ataxia or problems with walking or coordination,  change in mood or  memory.                Past Medical History:  Diagnosis Date   Arthritis    Bell's palsy    Dysrhythmia, cardiac    Fibromyalgia    Hashimoto's disease    History of CT scan 06/2017   "coronary CT scan on 06/01/2017, this did not find any cardiac calcifications. Coronary calcium score of zero."   Hypertension    Thyroid disease    Urticaria     Outpatient Medications Prior to Visit -  - NOTE:   Unable to verify as accurately reflecting what pt takes    Medication Sig Dispense Refill   albuterol (PROVENTIL) (2.5 MG/3ML) 0.083% nebulizer solution Take 3 mLs (2.5 mg total) by nebulization every 4 (four) hours as needed for wheezing. 75 mL 11   albuterol (VENTOLIN HFA) 108 (90 Base) MCG/ACT inhaler INHALE TWO PUFFS BY MOUTH EVERY 4 HOURS INTO  THE  LUNGS  AS  NEEDED 18 g 3   ALPRAZolam (XANAX) 0.5 MG tablet Take 1 tablet (0.5 mg total) by mouth 2 (two) times daily as needed for anxiety. 20 tablet 0   Ascorbic Acid (VITAMIN C) 500 MG CAPS Take 1 capsule by mouth 2 (two) times daily.  B Complex-C (SUPER B COMPLEX PO) Take by mouth daily at 6 (six) AM.     cetirizine (ZYRTEC) 10 MG tablet Take 10 mg by mouth 2 (two) times daily.     Cholecalciferol (VITAMIN D-3) 5000 UNITS TABS Take 1 tablet by mouth daily.     famotidine (PEPCID) 20 MG tablet Take 20 mg by mouth 2 (two) times daily.     furosemide (LASIX) 20 MG tablet TAKE 2 TABLETS BY MOUTH IN THE MORNING AS NEEDED 60 tablet 5   hydrocortisone (ANUSOL-HC) 2.5 % rectal cream Place 1 application. rectally 3 (three) times daily. Apply to rectum three times per day x10 days then as needed thereafter 45 g 1   ibuprofen (ADVIL) 200 MG tablet Take 400 mg by mouth every 6 (six) hours as needed.     Magnesium 200  MG TABS Take 1 tablet by mouth daily.     montelukast (SINGULAIR) 10 MG tablet Take 1 tablet (10 mg total) by mouth at bedtime. 30 tablet 5   OLIVE LEAF PO Take 1 tablet by mouth daily.     OVER THE COUNTER MEDICATION as needed. Red root     potassium chloride SA (KLOR-CON M) 20 MEQ tablet Take 1 tablet by mouth once daily 30 tablet 5   rosuvastatin (CRESTOR) 5 MG tablet Take 1 tablet (5 mg total) by mouth daily. 30 tablet 5   thyroid (NP THYROID) 60 MG tablet TAKE 1 TABLET BY MOUTH ONCE DAILY BEFORE BREAKFAST 30 tablet 5   HYDROcodone-acetaminophen (NORCO/VICODIN) 5-325 MG tablet Take 1 tablet by mouth every 6 (six) hours as needed. (Patient not taking: Reported on 04/06/2022)     No facility-administered medications prior to visit.     Objective:     BP (!) 130/98 (BP Location: Right Arm, Patient Position: Sitting, Cuff Size: Large)   Pulse 89   Temp 98.2 F (36.8 C) (Oral)   Ht '5\' 5"'$  (1.651 m)   Wt (!) 302 lb 9.6 oz (137.3 kg)   LMP 01/23/2014 Comment: spotting  SpO2 98% Comment: On RA  BMI 50.36 kg/m   SpO2: 98 % (On RA)  Obese somber MO (By bmi) nad   HEENT : Oropharynx  nl      Nasal turbinates nl   NECK :  without  appent JVD/ palpable Nodes/TM    LUNGS: no acc muscle use,  Nl contour chest which is clear to A and P bilaterally without cough on insp or exp maneuvers   CV:  RRR  no s3 or murmur or increase in P2, and no edema   ABD: obese soft and nontender with limited  inspiratory excursion in the supine position. No bruits or organomegaly appreciated   MS:  Nl gait/ ext warm without deformities Or obvious joint restrictions  calf tenderness, cyanosis or clubbing    SKIN: warm and dry without lesions    NEURO:  alert, approp, nl sensorium with  no motor or cerebellar deficits apparent.    CXR PA and Lateral:   04/08/2022 :    I personally reviewed images and impression is as follows:     No hyperinflation/ infiltrates     Assessment   DOE (dyspnea on  exertion) Onset late 40's  - PFT's  08/23/18  FEV1 2.44 (87 % ) ratio 0.76  p 6 % improvement from saba p 0 prior to study with DLCO  wnl FV curve nl and ERV 32% at wt 258   - Spirometry 04/08/2022  FEV1 2.6 (95%)  Ratio 0.96 with nl f/v at wt 302  - 04/08/2022   Walked on RA  x  3  lap(s) =  approx 750  ft  @ slow pace, stopped due to end of study/ min sob with lowest 02 sats 95%  - 04/08/2022  After extensive coaching inhaler device,  effectiveness =    75% > try symbicort 80 2bid and f/u in 6 weeks  Symptoms are markedly disproportionate to objective findings and not clear to what extent this is actually a pulmonary  problem but pt does appear to have difficult to sort out respiratory symptoms of unknown origin for which  DDX  = almost all start with A and  include Adherence, Ace Inhibitors, Acid Reflux, Active Sinus Disease, Alpha 1 Antitripsin deficiency, Anxiety masquerading as Airways dz,  ABPA,  Allergy(esp in young), Aspiration (esp in elderly), Adverse effects of meds,  Active smoking or Vaping, A bunch of PE's/clot burden (a few small clots can't cause this syndrome unless there is already severe underlying pulm or vascular dz with poor reserve),  Anemia or thyroid disorder, plus two Bs  = Bronchiectasis and Beta blocker use..and one C= CHF     Adherence is always the initial "prime suspect" and is a multilayered concern that requires a "trust but verify" approach in every patient - starting with knowing how to use medications, especially inhalers, correctly, keeping up with refills and understanding the fundamental difference between maintenance and prns vs those medications only taken for a very short course and then stopped and not refilled.  - see hfa teaching - return with all meds in hand using a trust but verify approach to confirm accurate Medication  Reconciliation The principal here is that until we are certain that the  patients are doing what we've asked, it makes no sense to ask them to  do more.   ? Allergy/ asthma > symbicort 80 2bid trial plus prn saba Re SABA :  I spent extra time with pt today reviewing appropriate use of albuterol for prn use on exertion with the following points: 1) saba is for relief of sob that does not improve by walking a slower pace or resting but rather if the pt does not improve after trying this first. 2) If the pt is convinced, as many are, that saba helps recover from activity faster then it's easy to tell if this is the case by re-challenging : ie stop, take the inhaler, then p 5 minutes try the exact same activity (intensity of workload) that just caused the symptoms and see if they are substantially diminished or not after saba 3) if there is an activity that reproducibly causes the symptoms, try the saba 15 min before the activity on alternate days   If in fact the saba really does help, then fine to continue to use it prn but advised may need to look closer at the maintenance regimen being used to achieve better control of airways disease with exertion.   ? Acid (or non-acid) GERD > always difficult to exclude as up to 75% of pts in some series report no assoc GI/ Heartburn symptoms> rec  diet restrictions/ reviewed and instructions given in writing.   ? Anxiety/depression/ wt gain/ depression > usually at the bottom of this list of usual suspects but  may interfere with adherence and also interpretation of response or lack thereof to symptom management which can be quite subjective.   ? Anemia/ thyroid dz  Lab Results  Component Value Date   HGB 16.5 (H) 03/23/2022   HGB 16.5 (H) 12/23/2021   HGB 16.7 (H) 04/08/2021   HGB 17.1 (H) 12/17/2020   HGB 17.6 (H) 10/30/2020    Lab Results  Component Value Date   TSH 2.900 04/03/2021    ? Adverse effects of meds > none of the usual suspects listed   ? chf > last echo 05/10/21 reviewed, no evidence of chf then but can't rule out IHD    F/u 6 weeks with all meds   Severe obesity (BMI  >= 40) (HCC) 08/23/18   ERV 32% at wt 258    Body mass index is 50.36 kg/m.  (  wt 302 ) Lab Results  Component Value Date   TSH 2.900 04/03/2021    Contributing to doe and risk of worsening  GERD >>>   reviewed the need and the process to achieve and maintain neg calorie balance > defer f/u primary care including intermittently monitoring thyroid status      Each maintenance medication was reviewed in detail including emphasizing most importantly the difference between maintenance and prns and under what circumstances the prns are to be triggered using an action plan format where appropriate.  Total time for H and P, chart review, counseling, reviewing hfa device(s) , directly observing portions of ambulatory 02 saturation study/ and generating customized AVS unique to this office visit / same day charting   > 29mn for refractory respiratory  symptoms of uncertain etiology              MChristinia Gully MD 04/08/2022

## 2022-04-08 ENCOUNTER — Ambulatory Visit (HOSPITAL_COMMUNITY): Payer: 59

## 2022-04-08 ENCOUNTER — Ambulatory Visit (INDEPENDENT_AMBULATORY_CARE_PROVIDER_SITE_OTHER): Payer: 59 | Admitting: Internal Medicine

## 2022-04-08 ENCOUNTER — Ambulatory Visit (INDEPENDENT_AMBULATORY_CARE_PROVIDER_SITE_OTHER): Payer: 59

## 2022-04-08 ENCOUNTER — Encounter: Payer: Self-pay | Admitting: Internal Medicine

## 2022-04-08 VITALS — BP 130/98 | HR 89 | Temp 98.2°F | Ht 65.0 in | Wt 302.6 lb

## 2022-04-08 DIAGNOSIS — R0602 Shortness of breath: Secondary | ICD-10-CM

## 2022-04-08 MED ORDER — BUDESONIDE-FORMOTEROL FUMARATE 80-4.5 MCG/ACT IN AERO: INHALATION_SPRAY | RESPIRATORY_TRACT | 12 refills | Status: DC

## 2022-04-08 NOTE — Patient Instructions (Signed)
GERD (REFLUX)  is an extremely common cause of respiratory symptoms just like yours , many times with no obvious heartburn at all.    It can be treated with medication, but also with lifestyle changes including elevation of the head of your bed (ideally with 6 -8inch blocks under the headboard of your bed),  Smoking cessation, avoidance of late meals, excessive alcohol, and avoid fatty foods, chocolate, peppermint, colas, red wine, and acidic juices such as orange juice.  NO MINT OR MENTHOL PRODUCTS SO NO COUGH DROPS  USE SUGARLESS CANDY INSTEAD (Jolley ranchers or Stover's or Life Savers) or even ice chips will also do - the key is to swallow to prevent all throat clearing. NO OIL BASED VITAMINS - use powdered substitutes.  Avoid fish oil when coughing.   Plan A = Automatic = Always=    Symbicort 80 Take 2 puffs first thing in am and then another 2 puffs about 12 hours later.    Work on inhaler technique:  relax and gently blow all the way out then take a nice smooth full deep breath back in, triggering the inhaler at same time you start breathing in.  Hold for up to 5 seconds if you can. Blow out thru nose. Rinse and gargle with water when done.  If mouth or throat bother you at all,  try brushing teeth/gums/tongue with arm and hammer toothpaste/ make a slurry and gargle and spit out.       Plan B = Backup (to supplement plan A, not to replace it) Only use your albuterol inhaler as a rescue medication to be used if you can't catch your breath by resting or doing a relaxed purse lip breathing pattern.  - The less you use it, the better it will work when you need it. - Ok to use the inhaler up to 2 puffs  every 4 hours if you must but call for appointment if use goes up over your usual need - Don't leave home without it !!  (think of it like the spare tire for your car)     Please schedule a follow up office visit in 6 weeks, call sooner if needed with all medications /inhalers/ solutions in hand  so we can verify exactly what you are taking. This includes all medications from all doctors and over the counters

## 2022-04-08 NOTE — Assessment & Plan Note (Signed)
08/23/18   ERV 32% at wt 258    Body mass index is 50.36 kg/m.  (  wt 302 ) Lab Results  Component Value Date   TSH 2.900 04/03/2021      Contributing to doe and risk of worsening  GERD >>>   reviewed the need and the process to achieve and maintain neg calorie balance > defer f/u primary care including intermittently monitoring thyroid status

## 2022-04-08 NOTE — Assessment & Plan Note (Addendum)
Onset late 40's  - PFT's  08/23/18  FEV1 2.44 (87 % ) ratio 0.76  p 6 % improvement from saba p 0 prior to study with DLCO  wnl FV curve nl and ERV 32% at wt 258   - Spirometry 04/08/2022  FEV1 2.6 (95%)  Ratio 0.96 with nl f/v at wt 302  - 04/08/2022   Walked on RA  x  3  lap(s) =  approx 750  ft  @ slow pace, stopped due to end of study/ min sob with lowest 02 sats 95%  - 04/08/2022  After extensive coaching inhaler device,  effectiveness =    75% > try symbicort 80 2bid and f/u in 6 weeks  Symptoms are markedly disproportionate to objective findings and not clear to what extent this is actually a pulmonary  problem but pt does appear to have difficult to sort out respiratory symptoms of unknown origin for which  DDX  = almost all start with A and  include Adherence, Ace Inhibitors, Acid Reflux, Active Sinus Disease, Alpha 1 Antitripsin deficiency, Anxiety masquerading as Airways dz,  ABPA,  Allergy(esp in young), Aspiration (esp in elderly), Adverse effects of meds,  Active smoking or Vaping, A bunch of PE's/clot burden (a few small clots can't cause this syndrome unless there is already severe underlying pulm or vascular dz with poor reserve),  Anemia or thyroid disorder, plus two Bs  = Bronchiectasis and Beta blocker use..and one C= CHF     Adherence is always the initial "prime suspect" and is a multilayered concern that requires a "trust but verify" approach in every patient - starting with knowing how to use medications, especially inhalers, correctly, keeping up with refills and understanding the fundamental difference between maintenance and prns vs those medications only taken for a very short course and then stopped and not refilled.  - see hfa teaching - return with all meds in hand using a trust but verify approach to confirm accurate Medication  Reconciliation The principal here is that until we are certain that the  patients are doing what we've asked, it makes no sense to ask them to do more.    ? Allergy/ asthma > symbicort 80 2bid trial plus prn saba Re SABA :  I spent extra time with pt today reviewing appropriate use of albuterol for prn use on exertion with the following points: 1) saba is for relief of sob that does not improve by walking a slower pace or resting but rather if the pt does not improve after trying this first. 2) If the pt is convinced, as many are, that saba helps recover from activity faster then it's easy to tell if this is the case by re-challenging : ie stop, take the inhaler, then p 5 minutes try the exact same activity (intensity of workload) that just caused the symptoms and see if they are substantially diminished or not after saba 3) if there is an activity that reproducibly causes the symptoms, try the saba 15 min before the activity on alternate days   If in fact the saba really does help, then fine to continue to use it prn but advised may need to look closer at the maintenance regimen being used to achieve better control of airways disease with exertion.   ? Acid (or non-acid) GERD > always difficult to exclude as up to 75% of pts in some series report no assoc GI/ Heartburn symptoms> rec  diet restrictions/ reviewed and instructions given in writing.   ?  Anxiety/depression/ wt gain/ depression > usually at the bottom of this list of usual suspects but  may interfere with adherence and also interpretation of response or lack thereof to symptom management which can be quite subjective.   ? Anemia/ thyroid dz    Lab Results  Component Value Date   HGB 16.5 (H) 03/23/2022   HGB 16.5 (H) 12/23/2021   HGB 16.7 (H) 04/08/2021   HGB 17.1 (H) 12/17/2020   HGB 17.6 (H) 10/30/2020    Lab Results  Component Value Date   TSH 2.900 04/03/2021    ? Adverse effects of meds > none of the usual suspects listed   ? chf > last echo 05/10/21 reviewed, no evidence of chf then but can't rule out IHD    F/u 6 weeks with all meds  Each maintenance medication  was reviewed in detail including emphasizing most importantly the difference between maintenance and prns and under what circumstances the prns are to be triggered using an action plan format where appropriate.  Total time for H and P, chart review, counseling, reviewing hfa device(s) , directly observing portions of ambulatory 02 saturation study/ and generating customized AVS unique to this office visit / same day charting   > 88mn for refractory respiratory  symptoms of uncertain etiology

## 2022-04-09 ENCOUNTER — Encounter: Payer: Self-pay | Admitting: Allergy & Immunology

## 2022-04-09 ENCOUNTER — Ambulatory Visit (INDEPENDENT_AMBULATORY_CARE_PROVIDER_SITE_OTHER): Payer: 59 | Admitting: Allergy & Immunology

## 2022-04-09 VITALS — BP 126/88 | HR 75 | Temp 98.2°F | Resp 16 | Ht 65.0 in | Wt 302.8 lb

## 2022-04-09 DIAGNOSIS — T7840XD Allergy, unspecified, subsequent encounter: Secondary | ICD-10-CM

## 2022-04-09 DIAGNOSIS — J4540 Moderate persistent asthma, uncomplicated: Secondary | ICD-10-CM | POA: Diagnosis not present

## 2022-04-09 DIAGNOSIS — J3089 Other allergic rhinitis: Secondary | ICD-10-CM | POA: Diagnosis not present

## 2022-04-09 DIAGNOSIS — L299 Pruritus, unspecified: Secondary | ICD-10-CM | POA: Diagnosis not present

## 2022-04-09 DIAGNOSIS — L508 Other urticaria: Secondary | ICD-10-CM

## 2022-04-09 MED ORDER — ALBUTEROL SULFATE (2.5 MG/3ML) 0.083% IN NEBU: 2.5000 mg | INHALATION_SOLUTION | Freq: Four times a day (QID) | RESPIRATORY_TRACT | 1 refills | Status: DC | PRN

## 2022-04-09 MED ORDER — CETIRIZINE HCL 10 MG PO TABS: 10.0000 mg | ORAL_TABLET | Freq: Every day | ORAL | 5 refills | Status: DC

## 2022-04-09 MED ORDER — ALBUTEROL SULFATE HFA 108 (90 BASE) MCG/ACT IN AERS: 2.0000 | INHALATION_SPRAY | Freq: Four times a day (QID) | RESPIRATORY_TRACT | 1 refills | Status: DC | PRN

## 2022-04-09 MED ORDER — EPINEPHRINE 0.3 MG/0.3ML IJ SOAJ: 0.3000 mg | INTRAMUSCULAR | 1 refills | Status: DC | PRN

## 2022-04-09 NOTE — Patient Instructions (Addendum)
1. Perennial allergic rhinitis - indoor molds - Continue with: Zyrtec (cetirizine) '10mg'$  tablet twice daily and Pepcid (famotidine) '40mg'$  twice daily - Continue with: Singulair (montelukast) '10mg'$  daily - Start taking: Cromolyn '200mg'$  four times daily (30 min before meals).  - You can use an extra dose of the antihistamine, if needed, for breakthrough symptoms.   2. Allergic reaction - possibly related to mold exposure - Start the Cromolyn since you already paid for it.  - I think that would be helpful to know whether this happened.  - We might be able to start Xolair as a mast cell stabilizer.  - EpiPen is up to date.  3. Shortness of breath - Lung testing looked low, but it did improve with the albuterol treatment. - Start prednisone burst provided (we will do a low dose burst).  - Spacer demonstration provided. - Nebulizer and demonstration provided.  - Daily controller medication(s): Symbicort 80/4.83mg two puffs twice daily with spacer - Prior to physical activity: albuterol 2 puffs 10-15 minutes before physical activity. - Rescue medications: albuterol 4 puffs every 4-6 hours as needed and albuterol nebulizer one vial every 4-6 hours as needed - Asthma control goals:  * Full participation in all desired activities (may need albuterol before activity) * Albuterol use two time or less a week on average (not counting use with activity) * Cough interfering with sleep two time or less a month * Oral steroids no more than once a year * No hospitalizations  4. Return in about 6 weeks (around 05/21/2022).    Please inform [place] any Emergency Department visits, hospitalizations, or changes in symptoms. Call [place]efore going to the ED for breathing or allergy symptoms since we might be able to fit you in for a sick visit. Feel free to contact u[place]anytime with any questions, problems, or concerns.  It was a pleasure to see you again today!  Websites that have reliable patient information: 1.  American Academy of Asthma, Allergy, and Immunology: www.aaaai.org 2. Food Allergy Research and Education (FARE): foodallergy.org 3. Mothers of Asthmatics: http://www.asthmacommunitynetwork.org 4. American College of Allergy, Asthma, and Immunology: www.acaai.org   COVID-19 Vaccine Information can be found at: [email or web address] questions related to vaccine distribution or appointments, please email [email or web address] or call [phone]   We realize that you might be concerned about having an allergic reaction to the COVID19 vaccines. To help with that concern, WE ARE OFFERING THE COVID19 VACCINES IN OUR OFFICE! Ask the front desk for dates!     "Like" [facility] Facebook and Instagram for our latest updates!      A healthy democracy works best when ANew [employer] Life Insuranceparticipate! Make sure you are registered to vote! If you have moved or changed any of your contact information, you will need to get this updated before voting!  In some cases, you MAY be able to register to vote online: [email or web address]

## 2022-04-09 NOTE — Progress Notes (Unsigned)
FOLLOW UP  Date of Service/Encounter:  04/09/22   Assessment:   Perennial allergic rhinitis (indoor molds)   Allergic reaction - unknown trigger  Chronic urticaria - despite daily antihistamines and montelukast  Burning of back with anxiety  Shortness of breath - starting controller medication for asthma to see if this helps  Plan/Recommendations:    1. Perennial allergic rhinitis - indoor molds - Continue with: Zyrtec (cetirizine) '10mg'$  tablet twice daily and Pepcid (famotidine) '40mg'$  twice daily - Continue with: Singulair (montelukast) '10mg'$  daily - Start taking: Cromolyn '200mg'$  four times daily (30 min before meals).  - You can use an extra dose of the antihistamine, if needed, for breakthrough symptoms.   2. Allergic reaction - possibly related to mold exposure - Start the Cromolyn since you already paid for it.  - I think that would be helpful to know whether this happened.  - We might be able to start Xolair as a mast cell stabilizer.  - EpiPen is up to date.  3. Shortness of breath - Lung testing looked low, but it did improve with the albuterol treatment. - Start prednisone burst provided (we will do a low dose burst).  - Spacer demonstration provided. - Nebulizer and demonstration provided.  - Daily controller medication(s): Symbicort 80/4.21mg two puffs twice daily with spacer - Prior to physical activity: albuterol 2 puffs 10-15 minutes before physical activity. - Rescue medications: albuterol 4 puffs every 4-6 hours as needed and albuterol nebulizer one vial every 4-6 hours as needed - Asthma control goals:  * Full participation in all desired activities (may need albuterol before activity) * Albuterol use two time or less a week on average (not counting use with activity) * Cough interfering with sleep two time or less a month * Oral steroids no more than once a year * No hospitalizations  4. Return in about 6 weeks (around 05/21/2022).    Subjective:    Summer TUNNELLis a 61y.o. female presenting today for follow up of  Chief Complaint  Patient presents with   Asthma    Shortness of breath - has seen a lung and heart doctor and was told it was an allergy issue. Takes zyrtec and pepcid. Singular made her anxiety worse she discontinued the medication.  Was prescribed Symbicort inhaler yesterday.    Other    She was cleaning yesterday and had a reaction to strong scents. Her skin was burning, shortness of breath, inflammation, and tightness in her chest     Summer Deedshas a history of the following: Patient Active Problem List   Diagnosis Date Noted   Constipation 04/06/2022   DOE (dyspnea on exertion) 01/18/2022   Emphysema lung ([facility] 01/15/2022   Hyperlipidemia 01/15/2022   Elevated hemoglobin ([facility]) 01/15/2022   Severe obesity (BMI >= 40) ([facility] 05/28/2021   Prediabetes 06/10/2014   Subclinical hypothyroidism 06/10/2014   Gastroesophageal reflux disease 04/01/2013   Fibromyalgia 03/22/2013   Facial paralysis/Bells palsy 02/01/2013    History obtained from: chart review and patient.  MNadyneis a 61y.o. female presenting for a follow up visit.  With normal extensive laboratory work-up in February that was largely normal.  The CBC did show an absolute eosinophil count of 200.  She had very high antithyroid antibodies.  However, she has a known history of hypothyroidism and is receiving treatment.  Her alpha gal panel, environmental allergy panel, extended mold panel, inflammatory markers, tryptase, chronic urticaria panel, and ANA were all normal.  She was cleaning her apartment last week and she started having some throat swelling. She took Zyrtec and Pepcid and she went outside. Then she took a nap and was "50% better". Then the following day she had pain in her chest that has improved. She has been complaining of mold exposure and she was told that she "needed to wait [her] turn". She applied 4 months ago and it was 6  months to a 12 months.   She has not gotten a new apartment yet. She thinks that she is still being exposed to mold. She reports that anything with a strong smell messes up her breathing.  She did fill her cromolyn just once.  It was $50.  She never started taking it because it was so expensive.  However, she is willing to try it since she is already shelled out many for 1 month of it.  I think that would be instructive as to whether it works or not.  She reports that she has been "exhaling weird". Dr. Melvyn Novas "blew it off". She told me that this was not a pun when I asked her. She did smile at that.   She has not picked up her Symbicort yet. She did practice the inhaler technique yesterday with Dr. Melvyn Novas. She said that Dr. Melvyn Novas does not "like breathing machines". She had a nebulizer at some point but it has broken. She got it from Dr. Luan Pulling when he was still working there. Dr. Melvyn Novas did not give her prednisone.  She is not sure whether her symptoms get better with prednisone.  She does not remember the last time she had any.  Her reactions have been happening more frequently.  She does notice when she is not in her apartment, her symptoms are much less frequent.  She almost feels better in the car when she is working with [employer]. This is her main income source at this point in time.   Of note, she does have a history of hives from a number of triggers.  She is on daily antihistamines now with continued breakthrough hives.  She has hives some a variety of triggers. She has had hives since she was a kid. She has never been on anything stronger than a 2nd generation antihistamine. She is currently on Singulair and still having symptoms.   Otherwise, there have been no changes to her past medical history, surgical history, family history, or social history.    Review of Systems  Constitutional: Negative.  Negative for chills, fever, malaise/fatigue and weight loss.  HENT:  Positive for congestion and  sinus pain. Negative for ear discharge and ear pain.   Eyes:  Negative for pain, discharge and redness.  Respiratory:  Positive for cough. Negative for sputum production, shortness of breath and wheezing.   Cardiovascular: Negative.  Negative for chest pain and palpitations.  Gastrointestinal:  Negative for abdominal pain, constipation, diarrhea, heartburn, nausea and vomiting.  Skin:  Positive for itching and rash.  Neurological:  Negative for dizziness and headaches.  Endo/Heme/Allergies:  Positive for environmental allergies. Does not bruise/bleed easily.       Objective:   Blood pressure 126/88, pulse 75, temperature 98.2 F (36.8 C), resp. rate 16, height '5\' 5"'$  (1.651 m), weight (!) 302 lb 12.8 oz (137.3 kg), last menstrual period 01/23/2014, SpO2 100 %. Body mass index is 50.39 kg/m.    Physical Exam Vitals reviewed.  Constitutional:      Appearance: She is well-developed.  Comments: Very anxious.  HENT:     Head: Normocephalic and atraumatic.     Right Ear: Tympanic membrane, ear canal and external ear normal. No drainage, swelling or tenderness. Tympanic membrane is not injected, scarred, erythematous, retracted or bulging.     Left Ear: Tympanic membrane, ear canal and external ear normal. No drainage, swelling or tenderness. Tympanic membrane is not injected, scarred, erythematous, retracted or bulging.     Nose: No nasal deformity, septal deviation, mucosal edema or rhinorrhea.     Right Turbinates: Enlarged, swollen and pale.     Left Turbinates: Enlarged, swollen and pale.     Right Sinus: No maxillary sinus tenderness or frontal sinus tenderness.     Left Sinus: No maxillary sinus tenderness or frontal sinus tenderness.     Comments: No polyps noted.     Mouth/Throat:     Mouth: Mucous membranes are not pale and not dry.     Pharynx: Uvula midline.  Eyes:     General:        Right eye: No discharge.        Left eye: No discharge.     Conjunctiva/sclera:  Conjunctivae normal.     Right eye: Right conjunctiva is not injected. No chemosis.    Left eye: Left conjunctiva is not injected. No chemosis.    Pupils: Pupils are equal, round, and reactive to light.  Cardiovascular:     Rate and Rhythm: Normal rate and regular rhythm.     Heart sounds: Normal heart sounds.  Pulmonary:     Effort: Pulmonary effort is normal. No tachypnea, accessory muscle usage or respiratory distress.     Breath sounds: Normal breath sounds. Decreased air movement present. No wheezing, rhonchi or rales.     Comments: Decreased air movement at the bases.  No wheezing or crackles. Chest:     Chest wall: No tenderness.  Abdominal:     Tenderness: There is no abdominal tenderness. There is no guarding or rebound.  Lymphadenopathy:     Head:     Right side of head: No submandibular, tonsillar or occipital adenopathy.     Left side of head: No submandibular, tonsillar or occipital adenopathy.     Cervical: No cervical adenopathy.  Skin:    General: Skin is warm.     Capillary Refill: Capillary refill takes less than 2 seconds.     Coloration: Skin is not pale.     Findings: No abrasion, erythema, petechiae or rash. Rash is not papular, urticarial or vesicular.     Comments: Skin somewhat dermatographic.   Neurological:     Mental Status: She is alert.  Psychiatric:        Behavior: Behavior is cooperative.      Diagnostic studies:    Spirometry: results abnormal (FEV1: 1.51/58%, FVC: 1.72/52%, FEV1/FVC: 88%).    Spirometry consistent with possible restrictive disease.   Allergy Studies: none       Salvatore Marvel, MD  [facility]

## 2022-04-11 ENCOUNTER — Encounter: Payer: Self-pay | Admitting: Allergy & Immunology

## 2022-04-12 ENCOUNTER — Ambulatory Visit (HOSPITAL_COMMUNITY): Admission: RE | Admit: 2022-04-12 | Payer: 59 | Source: Ambulatory Visit

## 2022-04-13 ENCOUNTER — Encounter (INDEPENDENT_AMBULATORY_CARE_PROVIDER_SITE_OTHER): Payer: Self-pay | Admitting: Gastroenterology

## 2022-04-19 ENCOUNTER — Ambulatory Visit (INDEPENDENT_AMBULATORY_CARE_PROVIDER_SITE_OTHER): Payer: 59 | Admitting: Urology

## 2022-04-19 VITALS — BP 163/106 | HR 96 | Ht 65.0 in | Wt 302.0 lb

## 2022-04-19 DIAGNOSIS — R310 Gross hematuria: Secondary | ICD-10-CM | POA: Diagnosis not present

## 2022-04-19 MED ORDER — CEPHALEXIN 250 MG PO CAPS
500.0000 mg | ORAL_CAPSULE | Freq: Once | ORAL | Status: AC
  Administered 2022-04-19: 500 mg via ORAL

## 2022-04-19 NOTE — Progress Notes (Signed)
   04/19/22  CC: No chief complaint on file.   HPI: F/u -    1) gross hematuria-patient developed gross hematuria. She had red urine. She noted this May 2023. She tried an abx. She underwent CT scan of the abdomen and pelvis May 2023 which was benign.  There was a 5.7 cm right renal cyst. She had back pain that continued. UA clear. She quit smoking 18 yrs ago. She may have had some vaginal bleeding.  She has a pelvic ultrasound ordered. She has occasional urgency and leakage of urine. She had blood in her stool and c-scope showed 11 polyps recently.    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   She returns for cystoscopy. She has no dysuria. No further gross hematuria.    Blood pressure (!) 163/106, pulse 96, height '5\' 5"'$  (1.651 m), weight (!) 302 lb (137 kg), last menstrual period 01/23/2014. NED. A&Ox3.   No respiratory distress   Abd soft, NT, ND Normal external genitalia with patent urethral meatus Chaperone: Courtney for exam and cystoscopy   Cystoscopy Procedure Note  Patient identification was confirmed, informed consent was obtained, and patient was prepped using Betadine solution.  Lidocaine jelly was administered per urethral meatus.    Procedure: - Flexible cystoscope introduced, without any difficulty.   - Thorough search of the bladder revealed:    normal urethral meatus    normal urothelium    no stones    no ulcers     no tumors    no urethral polyps    no trabeculation  - Ureteral orifices were normal in position and appearance.  Post-Procedure: - Patient tolerated the procedure well  Assessment/ Plan:  Gross hematuria - benign evaluation. If further bladder symptoms she will call. Will see back to check UA in 1 year. She has TV US tomorrow.    No follow-ups on file.  Festus Aloe, MD

## 2022-04-20 ENCOUNTER — Ambulatory Visit (HOSPITAL_COMMUNITY)
Admission: RE | Admit: 2022-04-20 | Discharge: 2022-04-20 | Disposition: A | Discharge status: Home / Self Care | Payer: 59 | Source: Ambulatory Visit | Attending: Nurse Practitioner | Admitting: Nurse Practitioner

## 2022-04-20 DIAGNOSIS — N950 Postmenopausal bleeding: Secondary | ICD-10-CM | POA: Insufficient documentation

## 2022-04-25 IMAGING — CT CT ANGIO CHEST
2 of 6 series · 19 of 46 positions shown · IV contrast (Omnipaque or Isovue)
Comparison: 10/09/2017

CLINICAL DATA: Chest pain and shortness of breath.

EXAM:
CT ANGIOGRAPHY CHEST WITH CONTRAST
TECHNIQUE: Multidetector CT imaging of the chest was performed using the
standard protocol during bolus administration of intravenous
contrast. Multiplanar CT image reconstructions and MIPs were
obtained to evaluate the vascular anatomy.
CONTRAST:  100mL OMNIPAQUE IOHEXOL 350 MG/ML SOLN

[Series 5: pe axial thins · axial · 0.73mm/px · z∈[+1316,+1590]mm · 16 of 374 slices shown]
[im 16/374  lung]
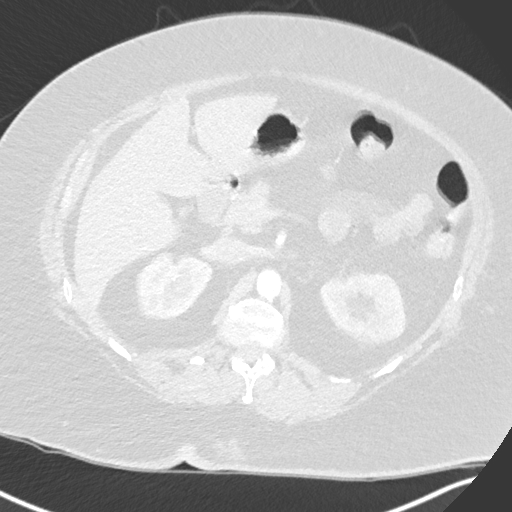
[im 47/374  soft-tissue]
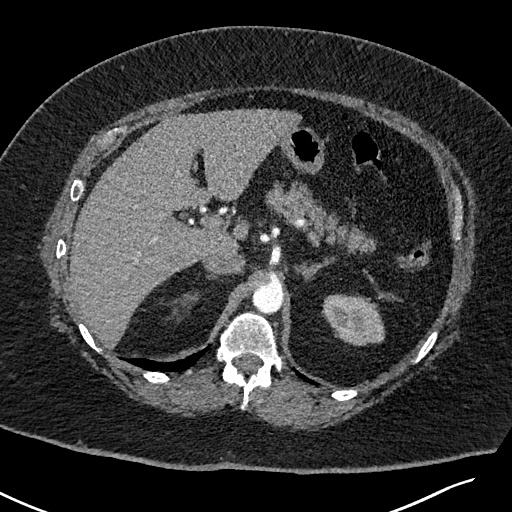
[im 63/374  lung]
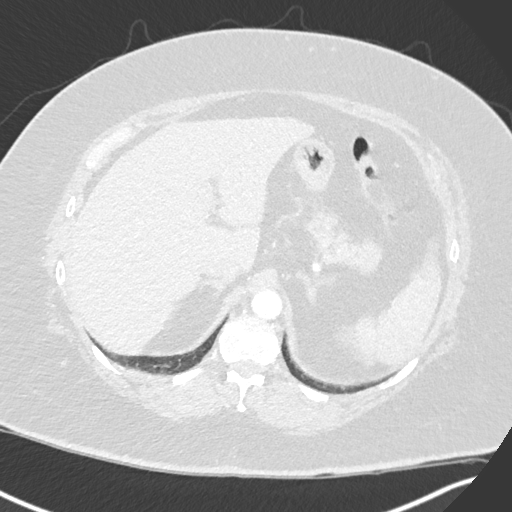
[im 94/374  soft-tissue]
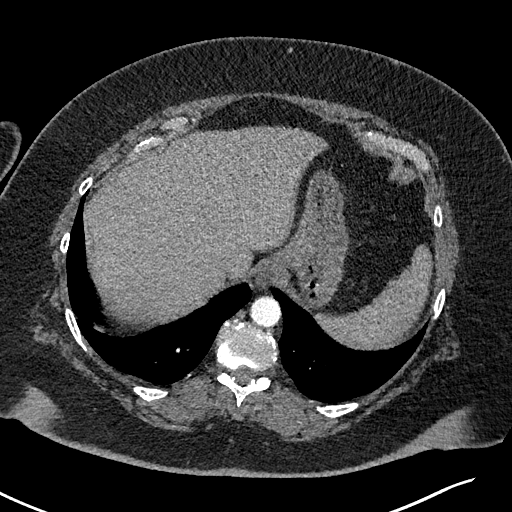
[im 109/374  lung]
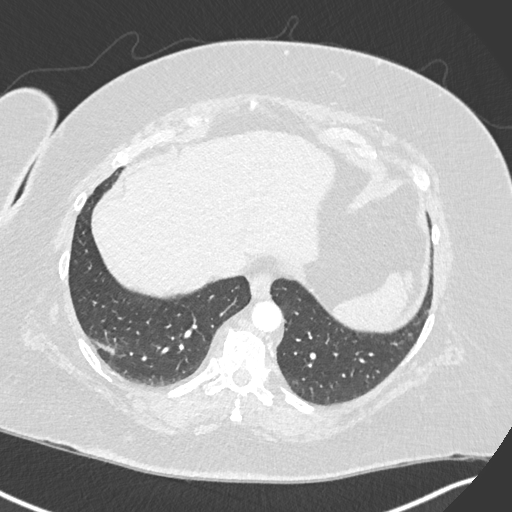
[im 125/374  soft-tissue]
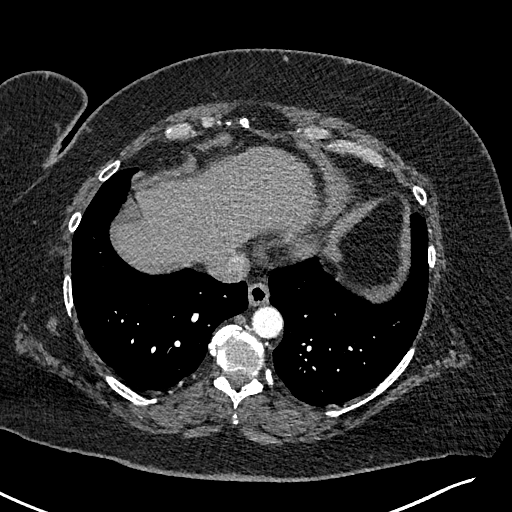
[im 156/374  lung]
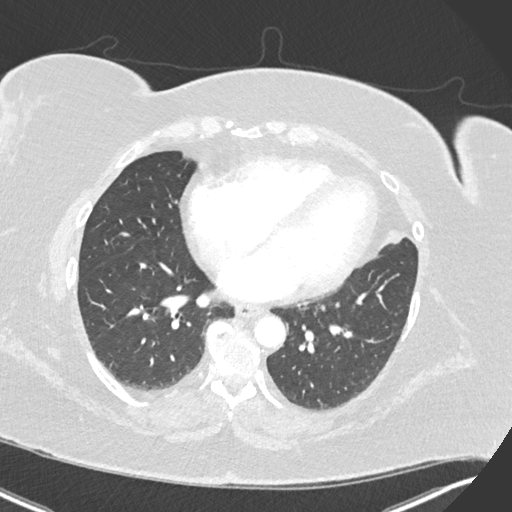
[im 171/374  soft-tissue]
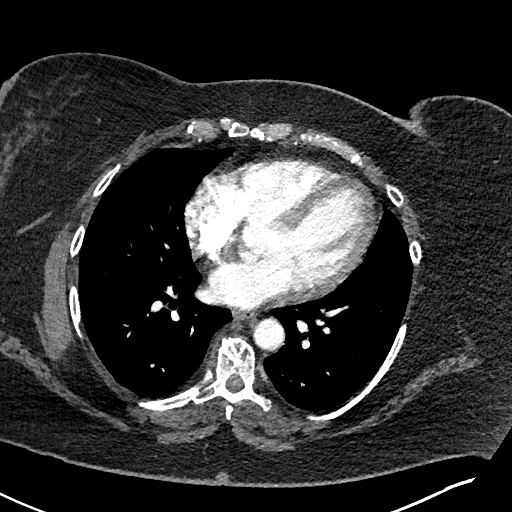
[im 203/374  lung]
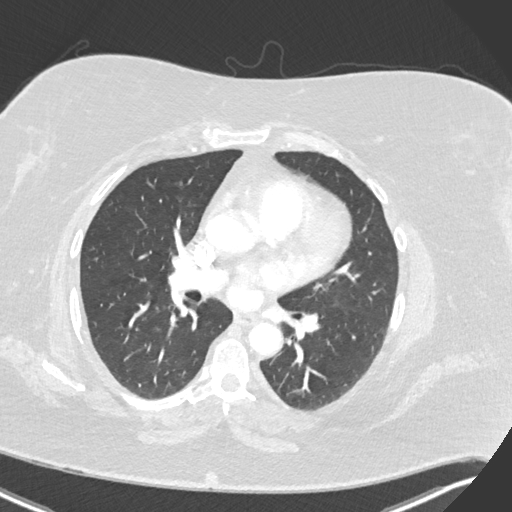
[im 218/374  soft-tissue]
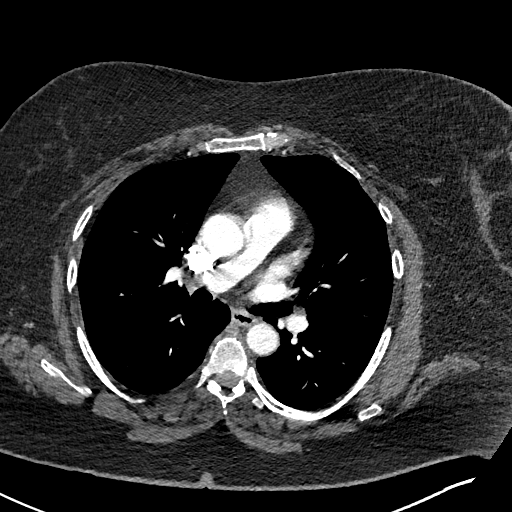
[im 249/374  lung]
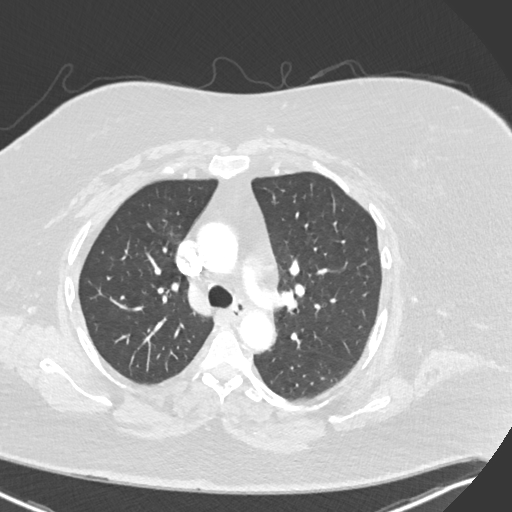
[im 265/374  soft-tissue]
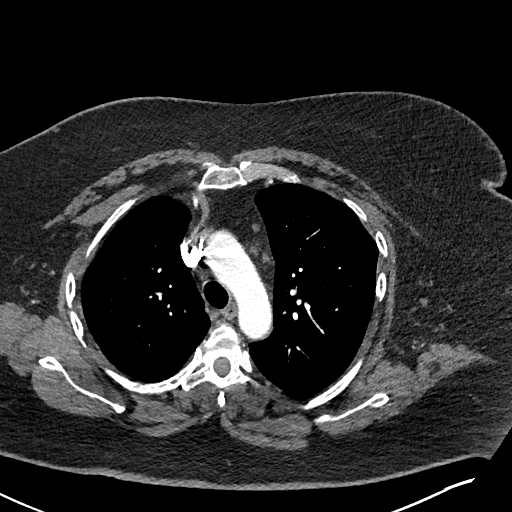
[im 280/374  lung]
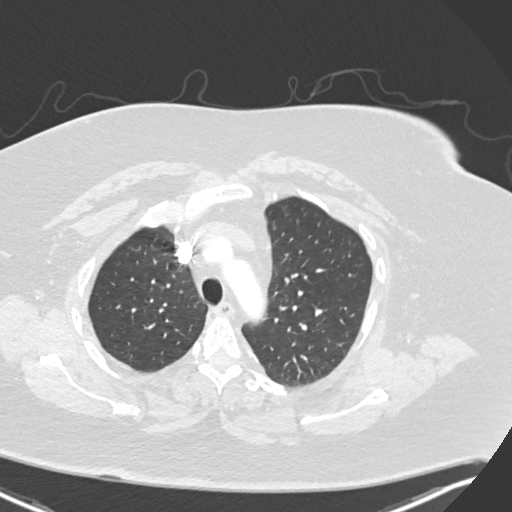
[im 311/374  soft-tissue]
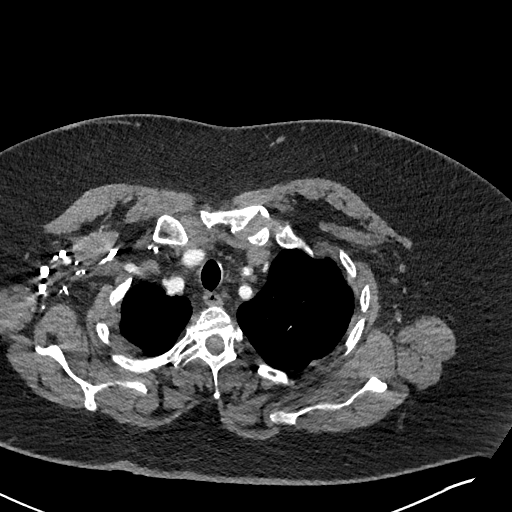
[im 327/374  lung]
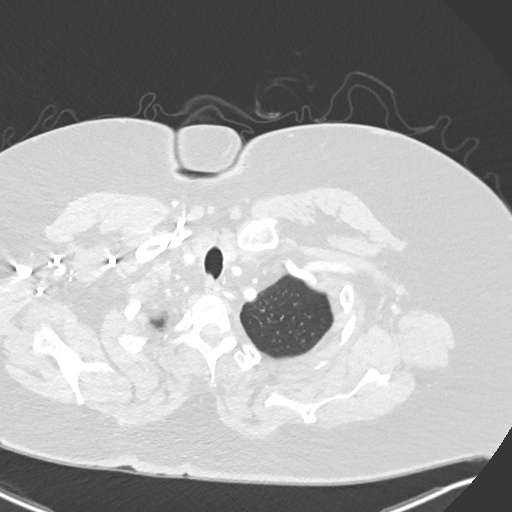
[im 358/374  soft-tissue]
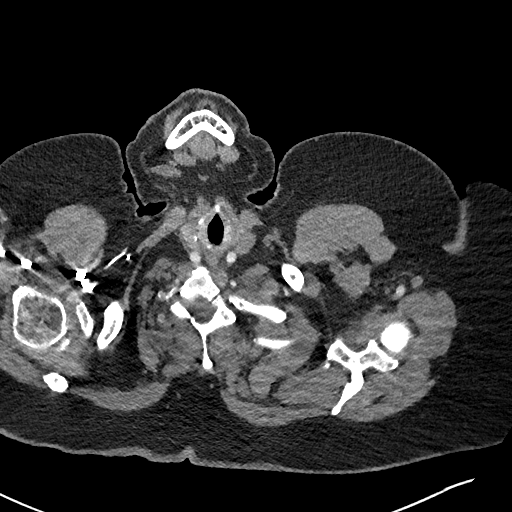

[Series 7: cor soft · coronal · 0.70mm/px · 3 of 165 slices shown]
[im 42/165  soft-tissue]
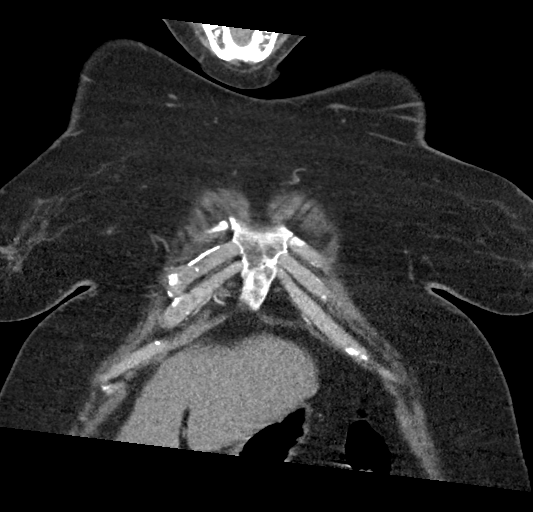
[im 83/165  soft-tissue]
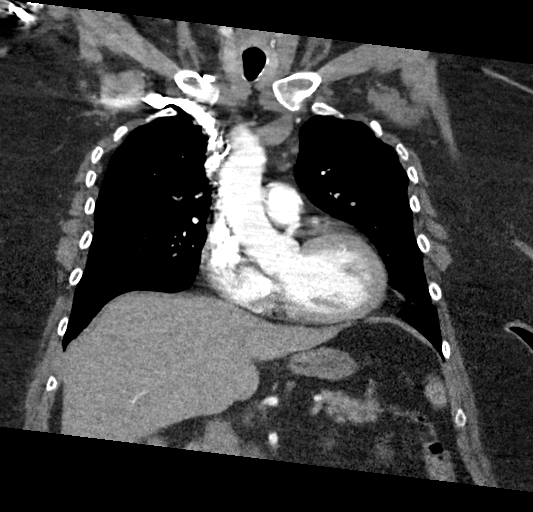
[im 124/165  soft-tissue]
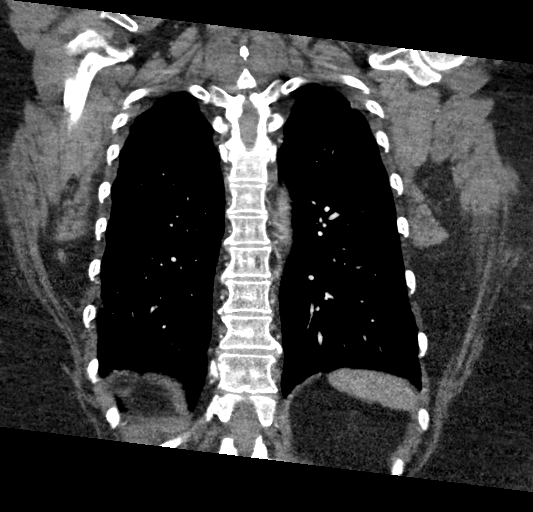

[19 of 46 positions shown; findings below may reference images not displayed]

FINDINGS: Cardiovascular: Satisfactory opacification of the pulmonary arteries
to the segmental level. No evidence of pulmonary embolism.

Mild cardiac enlargement. No pericardial effusion. Aortic
atherosclerosis.

Mediastinum/Nodes: Normal appearance of the thyroid gland. The
trachea appears patent and is midline. Normal appearance of the
esophagus. No enlarged lymph nodes.

Lungs/Pleura: No pleural effusion. Mild centrilobular emphysema. No
airspace consolidation, atelectasis, or pneumothorax.

Upper Abdomen: No acute abnormality within the imaged portions of
the upper abdomen.

Musculoskeletal: Degenerative disc disease noted within the thoracic
spine.

Review of the MIP images confirms the above findings.
IMPRESSION: 1. No evidence for acute pulmonary embolus.
2. Emphysema and aortic atherosclerosis.

Aortic Atherosclerosis (YDC4J-W8F.F) and Emphysema ([id number].M).

## 2022-04-26 ENCOUNTER — Ambulatory Visit: Payer: 59 | Admitting: Obstetrics & Gynecology

## 2022-04-26 ENCOUNTER — Telehealth: Payer: Self-pay | Admitting: *Deleted

## 2022-04-28 ENCOUNTER — Ambulatory Visit (INDEPENDENT_AMBULATORY_CARE_PROVIDER_SITE_OTHER): Payer: 59 | Admitting: Family Medicine

## 2022-04-28 ENCOUNTER — Encounter: Payer: Self-pay | Admitting: Family Medicine

## 2022-04-28 VITALS — BP 166/103 | HR 73 | Temp 98.1°F | Wt 286.4 lb

## 2022-04-28 DIAGNOSIS — R1030 Lower abdominal pain, unspecified: Secondary | ICD-10-CM | POA: Insufficient documentation

## 2022-04-28 DIAGNOSIS — R1031 Right lower quadrant pain: Secondary | ICD-10-CM

## 2022-04-28 NOTE — Progress Notes (Signed)
Subjective:  Patient ID: Summer [name], female    DOB: October 19, 1961  Age: 61 y.o. MRN: [id number]  CC: Chief Complaint  Patient presents with   Abdominal Pain    RLQ pain that began yesterday. Pt husband has appendix taken out and husband wanted her to be check out. Pain radiates across stomach. Felt nauseous yesterday. Decreased appetite.     HPI:  61 year old female presents for evaluation the above.  Patient reports that she developed right lower quadrant pain yesterday.  Initially started off as dull and now is very sharp in character.  She reports associated nausea.  Decreased appetite.  No vomiting.  No reported fever.  Pain has been severe at times.  Patient also notes that she has had recent change in her diet to a low histamine diet.  She has lost 19 pounds in the past month.  No relieving factors.  Exacerbated with activity.  No other complaints at this time.  Patient Active Problem List   Diagnosis Date Noted   Right lower quadrant abdominal pain 04/28/2022   Constipation 04/06/2022   DOE (dyspnea on exertion) 01/18/2022   Emphysema lung ([place]) 01/15/2022   Hyperlipidemia 01/15/2022   Elevated hemoglobin ([facility]) 01/15/2022   Severe obesity (BMI >= 40) ([facility]CC) 05/28/2021   Prediabetes 06/10/2014   Subclinical hypothyroidism 06/10/2014   Gastroesophageal reflux disease 04/01/2013   Fibromyalgia 03/22/2013    Social Hx   Social History   Socioeconomic History   Marital status: Married    Spouse name: Not on file   Number of children: Not on file   Years of education: Not on file   Highest education level: Not on file  Occupational History   Not on file  Tobacco Use   Smoking status: Former    Packs/day: 1.00    Years: 20.00    Total pack years: 20.00    Types: Cigarettes    Quit date: 11/08/2003    Years since quitting: 18.4   Smokeless tobacco: Never   Tobacco comments:    quit smoking 10 yrs   Vaping Use   Vaping Use: Never used  Substance and Sexual  Activity   Alcohol use: No   Drug use: No   Sexual activity: Yes    Birth control/protection: Post-menopausal  Other Topics Concern   Not on file  Social History Narrative   Not on file   Social Determinants of Health   Financial Resource Strain: Not on file  Food Insecurity: Not on file  Transportation Needs: Not on file  Physical Activity: Not on file  Stress: Not on file  Social Connections: Not on file    Review of Systems Per HPI No reports of urinary symptoms.  Objective:  BP (!) 166/103   Pulse 73   Temp 98.1 F (36.7 C)   Wt 286 lb 6.4 oz (129.9 kg)   LMP 01/23/2014 Comment: spotting  SpO2 97%   BMI 47.66 kg/m      04/28/2022   10:35 AM 04/19/2022    2:45 PM 04/09/2022    9:06 AM  BP/Weight  Systolic BP 845 364 680  Diastolic BP 321 224 88  Wt. (Lbs) 286.4 302 302.8  BMI 47.66 kg/m2 50.26 kg/m2 50.39 kg/m2    Physical Exam Vitals and nursing note reviewed.  Constitutional:      General: She is not in acute distress.    Appearance: She is well-developed. She is not ill-appearing.  Eyes:     General:  Right eye: No discharge.        Left eye: No discharge.     Conjunctiva/sclera: Conjunctivae normal.  Cardiovascular:     Rate and Rhythm: Normal rate and regular rhythm.  Pulmonary:     Effort: Pulmonary effort is normal.     Breath sounds: Normal breath sounds. No wheezing, rhonchi or rales.  Abdominal:     Palpations: Abdomen is soft.     Comments: Exquisitely tender to palpation in the right lower quadrant.  Mild suprapubic tenderness.  Neurological:     Mental Status: She is alert.  Psychiatric:        Mood and Affect: Mood normal.        Behavior: Behavior normal.     Lab Results  Component Value Date   WBC 8.4 03/23/2022   HGB 16.5 (H) 03/23/2022   HCT 49.0 (H) 03/23/2022   PLT 294 03/23/2022   GLUCOSE 126 (H) 03/23/2022   CHOL 173 04/03/2021   TRIG 127 04/03/2021   HDL 52 04/03/2021   LDLCALC 98 04/03/2021   ALT 23  03/23/2022   AST 29 03/23/2022   NA 137 03/23/2022   K 3.8 03/23/2022   CL 103 03/23/2022   CREATININE 0.72 03/23/2022   BUN 15 03/23/2022   CO2 26 03/23/2022   TSH 2.900 04/03/2021   HGBA1C 6.2 (H) 04/03/2021     Assessment & Plan:   Problem List Items Addressed This Visit       Other   Right lower quadrant abdominal pain - Primary    Acute onset right lower quadrant pain.  Concern for appendicitis.  Stat CT scan ordered.  Awaiting approval.      Relevant Orders   CT Abdomen Pelvis Bascom

## 2022-04-28 NOTE — Assessment & Plan Note (Signed)
Acute onset right lower quadrant pain.  Concern for appendicitis.  Stat CT scan ordered.  Awaiting approval.

## 2022-04-30 ENCOUNTER — Ambulatory Visit: Payer: 59 | Admitting: Family Medicine

## 2022-05-05 ENCOUNTER — Telehealth: Payer: Self-pay | Admitting: Family Medicine

## 2022-05-05 NOTE — Telephone Encounter (Signed)
Please notify patient of this issue hopefully within 7 days this is resolved if not let me know thank you

## 2022-05-05 NOTE — Telephone Encounter (Signed)
Pt inquiring in regards to CT scan that has been ordered. Referral coordinator contacted and states CT scan is still under review.

## 2022-05-05 NOTE — Telephone Encounter (Signed)
Patient notified

## 2022-05-13 ENCOUNTER — Ambulatory Visit (HOSPITAL_COMMUNITY): Admission: RE | Admit: 2022-05-13 | Payer: 59 | Source: Ambulatory Visit

## 2022-05-14 ENCOUNTER — Encounter: Payer: Self-pay | Admitting: Obstetrics & Gynecology

## 2022-05-14 ENCOUNTER — Other Ambulatory Visit (HOSPITAL_COMMUNITY)
Admission: RE | Admit: 2022-05-14 | Discharge: 2022-05-14 | Disposition: A | Discharge status: Home / Self Care | Payer: 59 | Source: Ambulatory Visit | Attending: Obstetrics & Gynecology | Admitting: Obstetrics & Gynecology

## 2022-05-14 ENCOUNTER — Ambulatory Visit (INDEPENDENT_AMBULATORY_CARE_PROVIDER_SITE_OTHER): Payer: 59 | Admitting: Obstetrics & Gynecology

## 2022-05-14 VITALS — BP 158/10 | HR 89 | Wt 303.4 lb

## 2022-05-14 DIAGNOSIS — N3010 Interstitial cystitis (chronic) without hematuria: Secondary | ICD-10-CM | POA: Diagnosis not present

## 2022-05-14 DIAGNOSIS — Z124 Encounter for screening for malignant neoplasm of cervix: Secondary | ICD-10-CM | POA: Diagnosis present

## 2022-05-14 DIAGNOSIS — R102 Pelvic and perineal pain: Secondary | ICD-10-CM

## 2022-05-14 DIAGNOSIS — N950 Postmenopausal bleeding: Secondary | ICD-10-CM | POA: Diagnosis not present

## 2022-05-14 DIAGNOSIS — R9389 Abnormal findings on diagnostic imaging of other specified body structures: Secondary | ICD-10-CM

## 2022-05-14 MED ORDER — MEDROXYPROGESTERONE ACETATE 10 MG PO TABS: ORAL_TABLET | ORAL | 4 refills | Status: DC

## 2022-05-14 NOTE — Progress Notes (Signed)
GYN VISIT Patient name: Summer [name] MRN [id number]  Date of birth: 07-20-1961 Chief Complaint:   Postmenopausal bleeding  History of Present Illness:   Summer [name] is a 61 y.o. [phone] female being seen today for the following concerns:  PMB: initially started 3 yrs ago- it is typically sporadic and only happens once in a while.  Bleeding will be bright red and last for 5 days (prevously 2 weeks).  Bleeding is light flow with one heavy day.  Typically using a few thin pads per day.  Also notes hematuria and rectal bleeding during that time.  Last episode of bleeding happened several months ago and bleeding did improve s/p Bactrim (which worked in the past as well.)  Denies vaginal discharge, itching or irritation.  She does note lower abdominal/pelvic pain especially after she eats.  GI work up was completed included colonoscopy- 2023.  Also seen by urology, cystoscopy was negative- 2023.     PUFF Questionnaire: 20  [place] reviewed- 04/2022: 11.6cm uterus with 4.5cm transmural fibroid, endometrium measures 42m.  Normal ovaries bilaterally  Last seen in Feb 2022 by Dr. EElonda Husky records were reviewed.  EMB negative.  She was supposed to start on progesterone, but due to concern for heart disease, but did not take medication.  Patient's last menstrual period was 01/23/2014.     04/01/2021    3:13 PM 02/29/2020    1:46 PM 08/04/2018    9:11 AM  Depression screen PHQ 2/9  Decreased Interest 2 0 2  Down, Depressed, Hopeless '2 1 3  '$ PHQ - 2 Score '4 1 5  '$ Altered sleeping '2 1 2  '$ Tired, decreased energy '2 3 2  '$ Change in appetite 2 0 1  Feeling bad or failure about yourself  2 0 3  Trouble concentrating '2 1 3  '$ Moving slowly or fidgety/restless 1 0 0  Suicidal thoughts 0 0 1  PHQ-9 Score '15 6 17  '$ Difficult doing work/chores Somewhat difficult Somewhat difficult Very difficult     Review of Systems:   Pertinent items are noted in HPI Denies fever/chills, dizziness, headaches,  visual disturbances, fatigue, shortness of breath, chest pain, vomiting. Pertinent History Reviewed:  Reviewed past medical,surgical, social, obstetrical and family history.  Reviewed problem list, medications and allergies. Physical Assessment:   Vitals:   05/14/22 1208  BP: (!) 158/100  Pulse: 89  Weight: (!) 303 lb 6.4 oz (137.6 kg)  Body mass index is 50.49 kg/m.       Physical Examination:   General appearance: alert, well appearing, and in no distress  Psych: mood appropriate, normal affect  Skin: warm & dry   Cardiovascular: normal heart rate noted  Respiratory: normal respiratory effort, no distress  Abdomen: obese, soft, non-tender   Pelvic: VULVA: normal appearing vulva with no masses, tenderness or lesions, VAGINA: normal appearing vagina with normal color and discharge, no lesions, CERVIX: normal appearing cervix without discharge or lesions  Extremities: no edema   Chaperone: Latisha Cresenzo    Assessment & Plan:  1) Postmenopausal bleeding, thickened endometrium -discussed estrogen-dominance -reviewed risk factors and management -prior EMB negative x 3, plan next step for hysteroscopy, D&C- reviewed risk/benefits including but not limited to risk of bleeding, infection and injury due to uterine perforation -Questions and concerns were addressed and desires to proceed -plan to start progesterone now.  Reviewed her concerns regarding risk/benefit of this medication.  Reassured pt that supplemental estrogen is what increases VTE risk, ok to take progesterone  2) Painful bladder syndrome -reviewed diagnosis -discussed importance of diet changes- reviewed most/least bothersome foods -pt notes lemon in her water daily -plan to make conservative changes and f/u in 92mo  3) Preventive screening -pap collected, reviewed guidelines -pt encouraged to complete mammogram- ordered placed by PCP   Meds ordered this encounter  Medications   medroxyPROGESTERone (PROVERA)  10 MG tablet    Sig: 10 days daily each month    Dispense:  30 tablet    Refill:  4      Return for TBD- will call to schedule surgery.   JJanyth Pupa DO Attending [place] [facility] [facility] [place]

## 2022-05-16 ENCOUNTER — Telehealth: Payer: Self-pay | Admitting: Family Medicine

## 2022-05-16 NOTE — Telephone Encounter (Signed)
Nurses-during a phone call related that she needs a prescription written for a walker with a seat due to arthritis and difficulty walking.  Please fill it out I will be happy to sign

## 2022-05-17 ENCOUNTER — Encounter: Payer: Self-pay | Admitting: Obstetrics & Gynecology

## 2022-05-18 ENCOUNTER — Ambulatory Visit (INDEPENDENT_AMBULATORY_CARE_PROVIDER_SITE_OTHER): Payer: 59 | Admitting: Family Medicine

## 2022-05-18 VITALS — BP 142/74 | HR 84 | Temp 97.7°F | Wt 303.0 lb

## 2022-05-18 DIAGNOSIS — R7303 Prediabetes: Secondary | ICD-10-CM

## 2022-05-18 DIAGNOSIS — M48062 Spinal stenosis, lumbar region with neurogenic claudication: Secondary | ICD-10-CM

## 2022-05-18 DIAGNOSIS — G5603 Carpal tunnel syndrome, bilateral upper limbs: Secondary | ICD-10-CM

## 2022-05-18 DIAGNOSIS — M4800 Spinal stenosis, site unspecified: Secondary | ICD-10-CM | POA: Insufficient documentation

## 2022-05-18 DIAGNOSIS — G5600 Carpal tunnel syndrome, unspecified upper limb: Secondary | ICD-10-CM | POA: Insufficient documentation

## 2022-05-18 DIAGNOSIS — M797 Fibromyalgia: Secondary | ICD-10-CM | POA: Diagnosis not present

## 2022-05-18 DIAGNOSIS — J432 Centrilobular emphysema: Secondary | ICD-10-CM | POA: Diagnosis not present

## 2022-05-18 DIAGNOSIS — R296 Repeated falls: Secondary | ICD-10-CM

## 2022-05-18 DIAGNOSIS — R1084 Generalized abdominal pain: Secondary | ICD-10-CM

## 2022-05-18 DIAGNOSIS — G4730 Sleep apnea, unspecified: Secondary | ICD-10-CM

## 2022-05-18 NOTE — Telephone Encounter (Signed)
These were signed thank you 

## 2022-05-18 NOTE — Progress Notes (Addendum)
Subjective:    Patient ID: Summer [name], female    DOB: Dec 17, 1960, 61 y.o.   MRN: [id number]  Abdominal Pain This is a recurrent problem. Associated symptoms include diarrhea and nausea.   Patient states has been to gastro specialist as well as GYN  Fibromyalgia  Centrilobular emphysema ([place])  Bilateral carpal tunnel syndrome  Spinal stenosis of lumbar region with neurogenic claudication  Generalized abdominal pain  Prediabetes  Sleep apnea, unspecified type  Frequent falls  Current Outpatient Medications on File Prior to Visit  Medication Sig Dispense Refill   albuterol (PROVENTIL) (2.5 MG/3ML) 0.083% nebulizer solution Take 3 mLs (2.5 mg total) by nebulization every 6 (six) hours as needed for wheezing or shortness of breath. 75 mL 1   albuterol (VENTOLIN HFA) 108 (90 Base) MCG/ACT inhaler Inhale 2 puffs into the lungs every 6 (six) hours as needed for wheezing or shortness of breath. 18 g 1   ALPRAZolam (XANAX) 0.5 MG tablet Take 1 tablet (0.5 mg total) by mouth 2 (two) times daily as needed for anxiety. 20 tablet 0   Ascorbic Acid (VITAMIN C) 500 MG CAPS Take 1 capsule by mouth 2 (two) times daily.      B Complex-C (SUPER B COMPLEX PO) Take by mouth daily at 6 (six) AM.     budesonide-formoterol (SYMBICORT) 80-4.5 MCG/ACT inhaler Take 2 puffs first thing in am and then another 2 puffs about 12 hours later. 1 each 12   cetirizine (ZYRTEC) 10 MG tablet Take 1 tablet (10 mg total) by mouth daily. 30 tablet 5   Cholecalciferol (VITAMIN D-3) 5000 UNITS TABS Take 1 tablet by mouth daily.     EPINEPHrine 0.3 mg/0.3 mL IJ SOAJ injection Inject 0.3 mg into the muscle as needed for anaphylaxis. 1 each 1   famotidine (PEPCID) 20 MG tablet Take 20 mg by mouth 2 (two) times daily.     furosemide (LASIX) 20 MG tablet TAKE 2 TABLETS BY MOUTH IN THE MORNING AS NEEDED 60 tablet 5   hydrocortisone (ANUSOL-HC) 2.5 % rectal cream Place 1 application. rectally 3 (three) times daily. Apply  to rectum three times per day x10 days then as needed thereafter 45 g 1   Magnesium 200 MG TABS Take 1 tablet by mouth daily.     medroxyPROGESTERone (PROVERA) 10 MG tablet 10 days daily each month 30 tablet 4   OLIVE LEAF PO Take 1 tablet by mouth daily.     OVER THE COUNTER MEDICATION as needed. Red root     potassium chloride SA (KLOR-CON M) 20 MEQ tablet Take 1 tablet by mouth once daily 30 tablet 5   rosuvastatin (CRESTOR) 5 MG tablet Take 1 tablet (5 mg total) by mouth daily. 30 tablet 5   thyroid (NP THYROID) 60 MG tablet TAKE 1 TABLET BY MOUTH ONCE DAILY BEFORE BREAKFAST 30 tablet 5   No current facility-administered medications on file prior to visit.   Patient relates that her legs hurt all the time constant aching discomfort in the muscles in the joints also in her back lower back and in her shoulders.  This is consistent with severe fibromyalgia. Her knees give her a lot of trouble with walking unable to go up and down steps has to do 1 step at a time and can only do a few steps.  If buildings have a second floor she has to use the elevator She relates a lot of imbalance difficulty with her balance with falls happening several times  per month losing her balance daily has to grab hold of things usually uses a cane or a walker to get around is very limited in her activities She states she can walk 10 to 15 minutes at 1 time but has to stop to rest or sit down she gets out of breath very easily She can sweep only 3 to 5 minutes at a time then she has to rest She is unable to do mopping because of back pain and leg pain She cannot lift more than 15 to 20 pounds at 1 time She can lift 5 pounds more often but difficulty lifting anything greater than this She is unable to do any ladders She finds her self having imbalance issues as well as dizziness with tilting her head left or right or looking up or down She is unable to sit more than 30 minutes at a time without having significant pain  and discomfort and having to lay down She also relates ongoing management of abdominal pain with intermittent nausea sees gastroenterology she is on a very limited diet She has sleep apnea symptoms at night with snoring pauses in her breathing Every night she has tingling into her arms as well as numbness into her hands and she relates weakness in her hands She states that she has also had bilateral leg pain and foot pain that occur with standing after 10 to 15 minutes and also occurring with sitting for more than 10 to 15 minutes  Review of Systems  Gastrointestinal:  Positive for abdominal pain, diarrhea and nausea.       Objective:   Physical Exam  General-in no acute distress Eyes-no discharge Lungs-respiratory rate normal, CTA CV-no murmurs,RRR Extremities skin warm dry no edema Neuro grossly normal Behavior normal, alert Morbid obesity Subjective discomfort in the lumbar spine Increased pain with straight leg raise  Positive Tinel's     Assessment & Plan:  1. Fibromyalgia This patient suffers with fibromyalgia she has had it for several years.  It is progressively worse.  Causing a lot of pain and discomfort in the arms legs back.  Causes a lot of fatigue and tiredness as well.  She tries to stay active but her physical limitations greatly limit her and what she can do.  2. Centrilobular emphysema ([place]) Patient does have emphysema which has been seen on CAT scan.  Patient has had a lot of secondhand smoke from her husband.  She does use albuterol. She complains of significant shortness of breath with activity.  She has seen pulmonary which is trying some inhalers.  Pulmonary function test does show good response to albuterol 3. Bilateral carpal tunnel syndrome She complains of bilateral arm and hand numbness and tingling wakes her up every single night relates a lot of numbness into the hands intermittent weakness as well consistent with carpal tunnel She would benefit from  nerve conduction studies but with her insurance plan when out of business she is working on getting new insurance 4. Spinal stenosis of lumbar region with neurogenic claudication She describes numbness tingling into both legs in the thigh region down to just below the knee that occurs after 10 to 15 minutes of walking after she rests or sits for period of time it would get better but then when she gets up and starts walking again she gets the same symptoms along with increased pain and discomfort.  She is unable to walk more than 10 minutes at a time.  This limits her with her  activities greatly.  She would benefit from an MRI but currently does not have insurance the insurance plan she is on when out of business  5. Generalized abdominal pain She has had ongoing abdominal pain and discomfort.  She has had a colonoscopy and EGD CAT scan and has had ongoing abdominal pain discomfort she relates multiple different medications cause her discomfort.  She states she is getting competing advice on what to do for her abdominal pain and what to do for dietary measures I have encouraged her to stick with soft vegetables fruits lean meats.  6. Prediabetes Minimize starches in the diet recommend A1c and lipid, liver, metabolic 7, CBC  7. Sleep apnea, unspecified type She has significant snoring at night with pauses in her breathing her husband states she gasp for air she finds herself sleepy during the day fatigued tired difficult to staying awake she would benefit from a sleep study.  Unfortunately her insurance company went out of business she would like to wait till she gets new insurance  8. Frequent falls Patient utilizes a cane she states she falls several times per week due to pain and discomfort in her legs along with numbness and weakness.  This is made worse by walking.  I encouraged her to continue the cane and we are in the process of trying to get her a walker with wheels and get it with a seat since  she cannot go long without having to sit  Physical limitations Unable to walk more than 10 to 15 minutes at a time without having to sit. Unable to do steps or ladders Due to fibromyalgia as well as carpal tunnel unable to do continuous hand activity.  Unable to do any lifting more than 5 pounds on a frequent basis 10 pounds on an occasional basis If sitting she relates she has to lay down or stand up and move around at least every 30 minutes Patient also relates a lot of fatigue tiredness sleepiness during the day. Would benefit from some additional testing but currently does not have insurance to afford this testing Given her multitude of health problems I do not see how any job would be able to make reasonable accommodations to allow her to work She is in the process of appealing/filing for for disability

## 2022-05-19 NOTE — Telephone Encounter (Signed)
Prescriptions up front for pick up. Patient notified

## 2022-05-20 LAB — CYTOLOGY - PAP
Comment: NEGATIVE
Diagnosis: NEGATIVE
Diagnosis: REACTIVE
High risk HPV: NEGATIVE

## 2022-05-21 ENCOUNTER — Ambulatory Visit (INDEPENDENT_AMBULATORY_CARE_PROVIDER_SITE_OTHER): Payer: 59 | Admitting: Allergy & Immunology

## 2022-05-21 ENCOUNTER — Ambulatory Visit: Payer: 59 | Admitting: Family Medicine

## 2022-05-21 ENCOUNTER — Encounter: Payer: Self-pay | Admitting: Allergy & Immunology

## 2022-05-21 VITALS — BP 138/86 | HR 83 | Temp 98.5°F | Resp 16 | Ht 64.17 in | Wt 301.8 lb

## 2022-05-21 DIAGNOSIS — J4540 Moderate persistent asthma, uncomplicated: Secondary | ICD-10-CM

## 2022-05-21 DIAGNOSIS — E063 Autoimmune thyroiditis: Secondary | ICD-10-CM | POA: Diagnosis not present

## 2022-05-21 MED ORDER — ALBUTEROL SULFATE (2.5 MG/3ML) 0.083% IN NEBU: 2.5000 mg | INHALATION_SOLUTION | Freq: Four times a day (QID) | RESPIRATORY_TRACT | 1 refills | Status: DC | PRN

## 2022-05-21 MED ORDER — ALBUTEROL SULFATE HFA 108 (90 BASE) MCG/ACT IN AERS: 2.0000 | INHALATION_SPRAY | Freq: Four times a day (QID) | RESPIRATORY_TRACT | 1 refills | Status: DC | PRN

## 2022-05-21 MED ORDER — FAMOTIDINE 20 MG PO TABS
20.0000 mg | ORAL_TABLET | Freq: Two times a day (BID) | ORAL | 5 refills | Status: DC
Start: 2022-05-21 — End: 2022-12-03

## 2022-05-21 MED ORDER — EPINEPHRINE 0.3 MG/0.3ML IJ SOAJ: 0.3000 mg | INTRAMUSCULAR | 1 refills | Status: DC | PRN

## 2022-05-21 MED ORDER — CETIRIZINE HCL 10 MG PO TABS: 10.0000 mg | ORAL_TABLET | Freq: Every day | ORAL | 5 refills | Status: DC | PRN

## 2022-05-21 MED ORDER — CROMOLYN SODIUM 20 MG/2ML IN NEBU: 20.0000 mg | INHALATION_SOLUTION | Freq: Four times a day (QID) | RESPIRATORY_TRACT | 5 refills | Status: DC

## 2022-05-21 NOTE — Progress Notes (Unsigned)
FOLLOW UP  Date of Service/Encounter:  05/21/22   Assessment:   Perennial allergic rhinitis (indoor molds)   Allergic reaction - unknown trigger   Chronic urticaria - despite daily antihistamines and montelukast   Burning of back with anxiety   Shortness of breath - starting controller medication for asthma to see if this helps  Plan/Recommendations:   1. Perennial allergic rhinitis - indoor molds - Continue with: Zyrtec (cetirizine) '10mg'$  tablet twice daily and Pepcid (famotidine) '40mg'$  twice daily - Continue with: Singulair (montelukast) '10mg'$  daily - Hopefully we can get the cromolyn to be cheaper with your new insurance plan.  - You can use an extra dose of the antihistamine, if needed, for breakthrough symptoms.   2. Allergic reaction - possibly related to mold exposure - Hopefully moving will help with the reactions. - Xolair might help (information provided) which can prevent allergic reactions.  - EpiPen is up to date.  3. Shortness of breath - Lung testing looks stable.  - [place] and [place] form provided for free drug.  - Daily controller medication(s): Symbicort 80/4.48mg two puffs twice daily with spacer and nebulizer cromolyn four times daily  - Prior to physical activity: albuterol 2 puffs 10-15 minutes before physical activity. - Rescue medications: albuterol 4 puffs every 4-6 hours as needed and albuterol nebulizer one vial every 4-6 hours as needed - Asthma control goals:  * Full participation in all desired activities (may need albuterol before activity) * Albuterol use two time or less a week on average (not counting use with activity) * Cough interfering with sleep two time or less a month * Oral steroids no more than once a year * No hospitalizations  4. Hashimoto's thyroiditis  - We referred you again to Endocrinology.  - Call [place] do not hear from them in the next week.   5. Return in about 3 months (around 08/21/2022).    Subjective:   Summer BAUTCHis a 61y.o. female presenting today for follow up of  Chief Complaint  Patient presents with   Asthma    Says she had an attack yesterday. Used her rescue inhaler but feels ok today.    Allergic Rhinitis     Says she is still having issues.     Summer CHESLERhas a history of the following: Patient Active Problem List   Diagnosis Date Noted   Carpal tunnel syndrome 05/18/2022   Spinal stenosis 05/18/2022   Right lower quadrant abdominal pain 04/28/2022   Constipation 04/06/2022   DOE (dyspnea on exertion) 01/18/2022   Emphysema lung ([facility] 01/15/2022   Hyperlipidemia 01/15/2022   Elevated hemoglobin (HCC) 01/15/2022   Morbid obesity ([facility] 05/28/2021   Prediabetes 06/10/2014   Hypothyroidism 06/10/2014   Gastroesophageal reflux disease 04/01/2013   Fibromyalgia 03/22/2013    History obtained from: chart review and patient.  MLaneshiais a 61y.o. female presenting for a follow up visit. She was last seen in June 2023. At that time, we continued with cetirizine and famotidine BID for control of her allergic rhinitis. She had already filled her cromolyn script, so I recommended that she take that for better control of her mast cell mediated symptoms. For hre allergic reactions, we recommended starting the cromolyn. We also discussed the possibility of adding on Xolair for control of her allergic reactions. For her SOB, we continued with the use of Symbicort 850m two puffs BID. We also started her on a prednisone burst.   Since  the last visit, she has done well. She is actually in the process  of moving apartments, which should theoretically be good for her since she feels that the exposure to her molds in her current apartment has been contributing to her worsening breathing symptoms.   Asthma/Respiratory Symptom History: She had an asthma attack yesterday.  She is going to be moving to a different apartment. She is very excited. She is hoping that this will help.  Is using  the Symbicort two puffs twice daily. She reports that this is $40 per month which is not affordable for her. Summer [name]'s asthma has been well controlled. She has not required rescue medication, experienced nocturnal awakenings due to lower respiratory symptoms, nor have activities of daily living been limited. She has required no Emergency Department or Urgent Care visits for her asthma. She has required zero courses of systemic steroids for asthma exacerbations since the last visit. ACT score today is 15, indicating terrible asthma symptom control.   She had a flare yesterday when she was outside. She felt SOB and she used her inhaler and did well with that. It took 10-15 minutes to calm down. She feels improved now and felt improved fairly quickly following the event.   Allergic Rhinitis Symptom History: She remains on her suppressive dosing of antihistamines. She is also on the montelukast. She has not been on antibiotics at all for her symptoms. She overall feels fairly good.   Allergic Reactions Symptom History: She continues to react to a lot of various foods and strong scents. 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 She remains interested in the [place]. She does get hives with these episodes, as well as some systemic symptoms.   She is wondering whether her allergic reactions are related at all to her thyroiditis. She has not seen Endocrinology at all. We did refer her at the last visit but it seems that this never went through. Dr. Wolfgang Phoenix has been managing her thyroid disease up to this point, but she would like a second opinion.   Otherwise, there have been no changes to her past medical history, surgical history, family history, or social history.    Review of Systems  Constitutional: Negative.  Negative for chills, fever, malaise/fatigue and weight loss.  HENT:  Positive for congestion. Negative for ear discharge, ear pain  and sinus pain.   Eyes:  Negative for pain, discharge and redness.  Respiratory:  Positive for cough. Negative for sputum production, shortness of breath and wheezing.   Cardiovascular: Negative.  Negative for chest pain and palpitations.  Gastrointestinal:  Negative for abdominal pain, constipation, diarrhea, heartburn, nausea and vomiting.  Skin:  Positive for itching and rash.  Neurological:  Negative for dizziness and headaches.  Endo/Heme/Allergies:  Positive for environmental allergies. Does not bruise/bleed easily.       Objective:   Blood pressure 138/86, pulse 83, temperature 98.5 F (36.9 C), temperature source Temporal, resp. rate 16, height 5' 4.17" (1.63 m), weight (!) 301 lb 12.8 oz (136.9 kg), last menstrual period 01/23/2014, SpO2 94 %. Body mass index is 51.52 kg/m.    Physical Exam Vitals reviewed.  Constitutional:      Appearance: She is well-developed.     Comments: Very anxious.  HENT:     Head: Normocephalic and atraumatic.     Right Ear: Tympanic membrane, ear canal and external ear normal. No drainage, swelling or tenderness. Tympanic membrane is  not injected, scarred, erythematous, retracted or bulging.     Left Ear: Tympanic membrane, ear canal and external ear normal. No drainage, swelling or tenderness. Tympanic membrane is not injected, scarred, erythematous, retracted or bulging.     Nose: No nasal deformity, septal deviation, mucosal edema or rhinorrhea.     Right Turbinates: Enlarged, swollen and pale.     Left Turbinates: Enlarged, swollen and pale.     Right Sinus: No maxillary sinus tenderness or frontal sinus tenderness.     Left Sinus: No maxillary sinus tenderness or frontal sinus tenderness.     Comments: No polyps noted.     Mouth/Throat:     Mouth: Mucous membranes are not pale and not dry.     Pharynx: Uvula midline.  Eyes:     General:        Right eye: No discharge.        Left eye: No discharge.     Conjunctiva/sclera:  Conjunctivae normal.     Right eye: Right conjunctiva is not injected. No chemosis.    Left eye: Left conjunctiva is not injected. No chemosis.    Pupils: Pupils are equal, round, and reactive to light.  Cardiovascular:     Rate and Rhythm: Normal rate and regular rhythm.     Heart sounds: Normal heart sounds.  Pulmonary:     Effort: Pulmonary effort is normal. No tachypnea, accessory muscle usage or respiratory distress.     Breath sounds: Normal breath sounds. Decreased air movement present. No wheezing, rhonchi or rales.     Comments: Decreased air movement at the bases.  No wheezing or crackles. Chest:     Chest wall: No tenderness.  Abdominal:     Tenderness: There is no abdominal tenderness. There is no guarding or rebound.  Lymphadenopathy:     Head:     Right side of head: No submandibular, tonsillar or occipital adenopathy.     Left side of head: No submandibular, tonsillar or occipital adenopathy.     Cervical: No cervical adenopathy.  Skin:    General: Skin is warm.     Capillary Refill: Capillary refill takes less than 2 seconds.     Coloration: Skin is not pale.     Findings: No abrasion, erythema, petechiae or rash. Rash is not papular, urticarial or vesicular.     Comments: Skin somewhat dermatographic.   Neurological:     Mental Status: She is alert.  Psychiatric:        Behavior: Behavior is cooperative.      Diagnostic studies: labs sent instead  Spirometry: results abnormal (FEV1: 1.65/63%, FVC: 2.03/61%, FEV1/FVC: 81%).    Spirometry consistent with possible restrictive disease.   Allergy Studies: none       Salvatore Marvel, MD  [facility]

## 2022-05-21 NOTE — Patient Instructions (Addendum)
1. Perennial allergic rhinitis - indoor molds - Continue with: Zyrtec (cetirizine) '10mg'$  tablet twice daily and Pepcid (famotidine) '40mg'$  twice daily - Continue with: Singulair (montelukast) '10mg'$  daily - Hopefully we can get the cromolyn to be cheaper with your new insurance plan.  - You can use an extra dose of the antihistamine, if needed, for breakthrough symptoms.   2. Allergic reaction - possibly related to mold exposure - Hopefully moving will help with the reactions. - Xolair might help (information provided) which can prevent allergic reactions.  - EpiPen is up to date.  3. Shortness of breath - Lung testing looks stable.  - [place] form provided for free drug.  - Daily controller medication(s): Symbicort 80/4.67mg two puffs twice daily with spacer and nebulizer cromolyn four times daily  - Prior to physical activity: albuterol 2 puffs 10-15 minutes before physical activity. - Rescue medications: albuterol 4 puffs every 4-6 hours as needed and albuterol nebulizer one vial every 4-6 hours as needed - Asthma control goals:  * Full participation in all desired activities (may need albuterol before activity) * Albuterol use two time or less a week on average (not counting use with activity) * Cough interfering with sleep two time or less a month * Oral steroids no more than once a year * No hospitalizations  4. Hashimoto's thyroiditis  - We referred you again to Endocrinology.  - Call [place] you do not hear from them in the next week.   5. Return in about 3 months (around 08/21/2022).    Please inform [place] any Emergency Department visits, hospitalizations, or changes in symptoms. Call [place]efore going to the ED for breathing or allergy symptoms since we might be able to fit you in for a sick visit. Feel free to contact [place]time with any questions, problems, or concerns.  It was a pleasure to see you again today!  Websites that have reliable patient information: 1. American  Academy of Asthma, Allergy, and Immunology: www.aaaai.org 2. Food Allergy Research and Education (FARE): foodallergy.org 3. Mothers of Asthmatics: http://www.asthmacommunitynetwork.org 4. American College of Allergy, Asthma, and Immunology: www.acaai.org   COVID-19 Vaccine Information can be found at: [email or web address] questions related to vaccine distribution or appointments, please email [email or web address] or call [phone]   We realize that you might be concerned about having an allergic reaction to the COVID19 vaccines. To help with that concern, WE ARE OFFERING THE COVID19 VACCINES IN OUR OFFICE! Ask the front desk for dates!     "Like" [place] Facebook and Instagram for our latest updates!      A healthy democracy works best when ANew [employer] Life Insuranceparticipate! Make sure you are registered to vote! If you have moved or changed any of your contact information, you will need to get this updated before voting!  In some cases, you MAY be able to register to vote online: [email or web address]

## 2022-05-23 ENCOUNTER — Encounter: Payer: Self-pay | Admitting: Allergy & Immunology

## 2022-05-24 ENCOUNTER — Telehealth: Payer: Self-pay | Admitting: *Deleted

## 2022-05-24 NOTE — Telephone Encounter (Signed)
-----   Message from Valentina Shaggy, MD sent at 05/23/2022  9:38 AM EDT ----- Interested in [facility] for management of her hives etc.

## 2022-05-24 NOTE — Telephone Encounter (Signed)
L/m for patient to contact me and discuss her change in Ins prior to starting approval process especially sine the plan she has is high deductible and would have to reach that with this plan and possibly next

## 2022-05-31 ENCOUNTER — Ambulatory Visit: Payer: 59 | Admitting: Internal Medicine

## 2022-06-02 ENCOUNTER — Ambulatory Visit: Payer: 59 | Admitting: Internal Medicine

## 2022-06-17 ENCOUNTER — Encounter: Payer: Self-pay | Admitting: Obstetrics & Gynecology

## 2022-06-22 NOTE — Telephone Encounter (Signed)
Spoke to patient and Summer [name] advised Summer [name] wanted to see Dr Ernst Bowler again before Summer [name] decides if Summer [name] wants to start Xolair

## 2022-06-23 NOTE — Telephone Encounter (Signed)
Noted. Thanks.

## 2022-06-24 ENCOUNTER — Encounter (HOSPITAL_COMMUNITY): Payer: 59

## 2022-06-28 ENCOUNTER — Other Ambulatory Visit: Payer: Self-pay | Admitting: Family Medicine

## 2022-07-15 ENCOUNTER — Encounter: Payer: 59 | Admitting: Obstetrics & Gynecology

## 2022-07-21 ENCOUNTER — Ambulatory Visit: Payer: 59 | Admitting: Obstetrics & Gynecology

## 2022-07-29 ENCOUNTER — Encounter (HOSPITAL_COMMUNITY)
Admission: RE | Admit: 2022-07-29 | Discharge: 2022-07-29 | Disposition: A | Discharge status: Home / Self Care | Payer: 59 | Source: Ambulatory Visit | Attending: Obstetrics & Gynecology | Admitting: Obstetrics & Gynecology

## 2022-07-29 DIAGNOSIS — Z79899 Other long term (current) drug therapy: Secondary | ICD-10-CM

## 2022-08-02 ENCOUNTER — Ambulatory Visit (INDEPENDENT_AMBULATORY_CARE_PROVIDER_SITE_OTHER): Payer: 59 | Admitting: Nurse Practitioner

## 2022-08-02 ENCOUNTER — Other Ambulatory Visit: Payer: Self-pay | Admitting: Nurse Practitioner

## 2022-08-02 ENCOUNTER — Encounter: Payer: Self-pay | Admitting: Nurse Practitioner

## 2022-08-02 VITALS — BP 161/98 | HR 57 | Temp 97.2°F | Ht 64.0 in | Wt 305.0 lb

## 2022-08-02 DIAGNOSIS — L304 Erythema intertrigo: Secondary | ICD-10-CM | POA: Diagnosis not present

## 2022-08-02 DIAGNOSIS — G4733 Obstructive sleep apnea (adult) (pediatric): Secondary | ICD-10-CM | POA: Diagnosis not present

## 2022-08-02 DIAGNOSIS — N644 Mastodynia: Secondary | ICD-10-CM

## 2022-08-02 MED ORDER — NYSTATIN 100000 UNIT/GM EX CREA: 1.0000 | TOPICAL_CREAM | Freq: Two times a day (BID) | CUTANEOUS | 2 refills | Status: DC

## 2022-08-02 NOTE — Progress Notes (Signed)
   Subjective:    Patient ID: Summer [name], female    DOB: Mar 26, 1961, 61 y.o.   MRN: [id number]  HPI  61 year old female patient presents to clinic today with concerns about breast tenderness and nipple tenderness x2 weeks.  Patient also concerned about swollen lymph nodes under her arm causing her bilateral armpits to hurt.  Patient denies any changes to her skin, or nipple discharge.  Review of Systems  Constitutional:        Breast tenderness  All other systems reviewed and are negative.      Objective:   Physical Exam Vitals reviewed.  Constitutional:      General: She is not in acute distress.    Appearance: Normal appearance. She is obese. She is not ill-appearing, toxic-appearing or diaphoretic.  HENT:     Head: Normocephalic and atraumatic.  Chest:     Chest wall: No mass, lacerations, deformity, swelling, tenderness, crepitus or edema. There is no dullness to percussion.  Breasts:    Breasts are symmetrical.     Right: Normal. No swelling, bleeding, inverted nipple, mass, nipple discharge, skin change or tenderness.     Left: Normal. No swelling, bleeding, inverted nipple, mass, nipple discharge, skin change or tenderness.     Comments: No lymphadenopathy noted on exam today.  No tenderness to palpation bilateral breasts, no skin changes or thickening noted.  Nipples appear normal today  Large erythemic area noted under right breast. Lymphadenopathy:     Upper Body:     Right upper body: No supraclavicular, axillary or pectoral adenopathy.     Left upper body: No supraclavicular, axillary or pectoral adenopathy.  Neurological:     Mental Status: She is alert.  Psychiatric:        Mood and Affect: Mood normal.        Behavior: Behavior normal.           Assessment & Plan:   1. Breast pain in female -Patient referred to [facility] at the [facility] per patient request for diagnostic mammogram and bilateral breast ultrasound -Patient  has appointment on November 15 at 8:40 AM. -Patient encouraged to call center for sooner appointment if possible. - MM Digital Diagnostic Bilat -Return to clinic as needed if symptoms worsen or change.  2. Intertrigo - nystatin cream (MYCOSTATIN); Apply 1 Application topically 2 (two) times daily.  Dispense: 30 g; Refill: 2  3. OSA (obstructive sleep apnea) -Patient had sleep apnea referral however it was closed due to unsuccessful attempt to reach her. -Referral placed today - Ambulatory referral to Sleep Studies    Note:  This document was prepared using Dragon voice recognition software and may include unintentional dictation errors. Note - This record has been created using [employer].  Chart creation errors have been sought, but may not always  have been located. Such creation errors do not reflect on  the standard of medical care.

## 2022-08-02 NOTE — Patient Instructions (Signed)
Mammogram Scheduled with [facility] at 8:40am

## 2022-08-03 ENCOUNTER — Ambulatory Visit: Admission: RE | Admit: 2022-08-03 | Payer: 59 | Source: Home / Self Care | Admitting: Obstetrics & Gynecology

## 2022-08-03 ENCOUNTER — Encounter: Admission: RE | Payer: Self-pay | Source: Home / Self Care

## 2022-08-03 SURGERY — DILATATION AND CURETTAGE /HYSTEROSCOPY
Anesthesia: Choice

## 2022-08-05 ENCOUNTER — Ambulatory Visit (INDEPENDENT_AMBULATORY_CARE_PROVIDER_SITE_OTHER): Payer: 59 | Admitting: Gastroenterology

## 2022-08-05 ENCOUNTER — Encounter (INDEPENDENT_AMBULATORY_CARE_PROVIDER_SITE_OTHER): Payer: Self-pay | Admitting: Gastroenterology

## 2022-08-05 VITALS — BP 159/97 | HR 84 | Temp 98.1°F | Ht 65.0 in | Wt 307.0 lb

## 2022-08-05 DIAGNOSIS — R1012 Left upper quadrant pain: Secondary | ICD-10-CM

## 2022-08-05 DIAGNOSIS — R195 Other fecal abnormalities: Secondary | ICD-10-CM | POA: Diagnosis not present

## 2022-08-05 DIAGNOSIS — R1030 Lower abdominal pain, unspecified: Secondary | ICD-10-CM

## 2022-08-05 DIAGNOSIS — K219 Gastro-esophageal reflux disease without esophagitis: Secondary | ICD-10-CM | POA: Diagnosis not present

## 2022-08-05 NOTE — Patient Instructions (Addendum)
We will get an ultrasound of your abdomen I would also like to check your liver function to ensure this is still normal, [facility] can do [facility] since [facility] are unable to use [facility] Lets do famotidine '20mg'$  in the morning and increase to '40mg'$  in the evenings Continue probiotic daily You can use otc simethicone or beano for bloating/gas  Follow up 3 months

## 2022-08-05 NOTE — Progress Notes (Unsigned)
Referring Provider: Kathyrn Drown, MD Primary Care Physician:  Kathyrn Drown, MD Primary GI Physician: Jenetta Downer   Chief Complaint  Patient presents with   Follow-up    Patient here today with concerns of lower abdominal pain and and bloating she is taking probiotics. She also says her stools are orange in color and have been this color for over a year.   HPI:   Summer [name] is a 61 y.o. female with past medical history of heart failure (normal EF July 2022), Fibromyalgia, Bell's palsy, hypothyroidism, HTN.   Patient presenting today for follow up of abdominal pain and bloating.  Last seen 04/06/22. At that time had presence of rectal bleeding about 2 weeks prior, She states that she was having rectal bleeding, vaginal bleeding and hematuria. She states that she was started on biaxin and took this for 3 days, bleeding then resolved. She had a rectal exam at her PCP and they noted quite a few hemorrhoids at that time. She reported orange stools that have been ongoing for the past year. Having LUQ pain and nausea intermittently. Stools continued to be hard at times and require straining to defecate. Recent CT A/P w/o contrast on 03/25/22 that was unremarkable.    reflux is doing pretty good since switching to the pepcid '20mg'$  BID. previously on omeprazole which did not provide much relief of her symptoms. Appetite not great, she has lost a few pounds. She states that a lot of times when she eats certain foods, she has abdominal discomfort, usually LUQ.  She has recently started avoiding gluten and sugar, though it has only been a few days so she is unsure of results yet.   Recommended to increase water intake and fiber, start miralax, anusol cream TID x10 days for hemorrhoids, probiotic daily, continue H2B BID, limit toilet time and straining   Most recent hgb 16.5, LFTs WNL, amylase and lipase normal on 03/23/22.   Present:  Patient continues to have LUQ pain, bloating and belching. She  states she is taking a daily probiotic with some improvement. She continues to also have some bilateral lower abdominal pain that comes and goes. Pain does not seem to matter what she eats, yesterday she notes that she had not eaten anything and still had bloating. Has 3 BMs per day that are soft but solid. She states that stools continue to be orange, the color of a sweet potato. She notes that she has drank dandelion tea which is supposed to help cleanse the liver and noted more brown colored stools with this. Denies rectal bleeding or melena.   She continues to have heartburn 2-3x/week on pepcid BID. She does not feel that dietary intake seems to influence her symptoms very much. Notes that famotidine initially worked but recently feels that it is not controlling her symptoms. She has woke up at night wheezing and is unsure if this is related to her allergies or reflux. Symptoms more prevalent at night.   Last Colonoscopy:11/24/21- Hemorrhoids found on perianal exam. - Six 3 to 8 mm polyps in the transverse colon, in the ascending colon and in the cecum, - Two 1 to 2 mm polyps in the transverse colon - Medium-sized lipoma in the transverse colon. - Three 3 to 5 mm polyps in the rectum and in the sigmoid colon - The entire examined colon is normal. Biopsied-normal - Internal hemorrhoids.  (total of 9 tubular adenomas, one submucosal lipoma and 1 hyperplastic polyp) Last Endoscopy:11/24/21 2 cm hiatal  hernia. - Normal stomach. Biopsied-normal  - Normal examined duodenum. Biopsied-normal- no celiac  Past Medical History:  Diagnosis Date   Arthritis    Bell's palsy    Dysrhythmia, cardiac    Fibromyalgia    Hashimoto's disease    History of CT scan 06/2017   "coronary CT scan on 06/01/2017, this did not find any cardiac calcifications. Coronary calcium score of zero."   Hypertension    Thyroid disease    Urticaria     Past Surgical History:  Procedure Laterality Date   BIOPSY  11/24/2021    Procedure: BIOPSY;  Surgeon: Harvel Quale, MD;  Location: [facility];  Service: Gastroenterology;;   COLONOSCOPY N/A 01/23/2014   Procedure: COLONOSCOPY;  Surgeon: Rogene Houston, MD;  Location: [facility];  Service: Endoscopy;  Laterality: N/A;  200   COLONOSCOPY WITH PROPOFOL N/A 11/24/2021   Procedure: COLONOSCOPY WITH PROPOFOL;  Surgeon: Harvel Quale, MD;  Location: [facility];  Service: Gastroenterology;  Laterality: N/A;  205   ESOPHAGOGASTRODUODENOSCOPY (EGD) WITH PROPOFOL N/A 11/24/2021   Procedure: ESOPHAGOGASTRODUODENOSCOPY (EGD) WITH PROPOFOL;  Surgeon: Harvel Quale, MD;  Location: [facility];  Service: Gastroenterology;  Laterality: N/A;   POLYPECTOMY  11/24/2021   Procedure: POLYPECTOMY;  Surgeon: Montez Morita, Quillian Quince, MD;  Location: [facility];  Service: Gastroenterology;;   TONSILLECTOMY      Current Outpatient Medications  Medication Sig Dispense Refill   albuterol (PROVENTIL) (2.5 MG/3ML) 0.083% nebulizer solution Take 3 mLs (2.5 mg total) by nebulization every 6 (six) hours as needed for wheezing or shortness of breath. 75 mL 1   albuterol (VENTOLIN HFA) 108 (90 Base) MCG/ACT inhaler Inhale 2 puffs into the lungs every 6 (six) hours as needed for wheezing or shortness of breath. 18 g 1   ALPRAZolam (XANAX) 0.5 MG tablet Take 1 tablet by mouth twice daily as needed for anxiety 20 tablet 0   Ascorbic Acid (VITAMIN C) 500 MG CAPS Take 1 capsule by mouth 2 (two) times daily.      B Complex-C (SUPER B COMPLEX PO) Take by mouth daily at 6 (six) AM.     cetirizine (ZYRTEC) 10 MG tablet Take 1 tablet (10 mg total) by mouth daily as needed for allergies (Can take an extra dose during flare ups.). (Patient taking differently: Take 10 mg by mouth 2 (two) times daily.) 60 tablet 5   Cholecalciferol (VITAMIN D-3) 5000 UNITS TABS Take 1 tablet by mouth daily.     cromolyn (INTAL) 20 MG/2ML nebulizer solution Take 2 mLs (20 mg total)  by nebulization in the morning, at noon, in the evening, and at bedtime. 120 mL 5   EPINEPHrine 0.3 mg/0.3 mL IJ SOAJ injection Inject 0.3 mg into the muscle as needed for anaphylaxis. 1 each 1   famotidine (PEPCID) 20 MG tablet Take 1 tablet (20 mg total) by mouth 2 (two) times daily. 60 tablet 5   furosemide (LASIX) 20 MG tablet TAKE 2 TABLETS BY MOUTH IN THE MORNING AS NEEDED 60 tablet 5   Magnesium 200 MG TABS Take 1 tablet by mouth daily.     nystatin cream (MYCOSTATIN) Apply 1 Application topically 2 (two) times daily. 30 g 2   OLIVE LEAF PO Take 1 tablet by mouth daily.     OVER THE COUNTER MEDICATION as needed. Red root     potassium chloride SA (KLOR-CON M) 20 MEQ tablet Take 1 tablet by mouth once daily 30 tablet 5  rosuvastatin (CRESTOR) 5 MG tablet Take 1 tablet (5 mg total) by mouth daily. 30 tablet 5   thyroid (NP THYROID) 60 MG tablet TAKE 1 TABLET BY MOUTH ONCE DAILY BEFORE BREAKFAST 30 tablet 5   medroxyPROGESTERone (PROVERA) 10 MG tablet 10 days daily each month (Patient not taking: Reported on 08/05/2022) 30 tablet 4   No current facility-administered medications for this visit.    Allergies as of 08/05/2022 - Review Complete 08/05/2022  Allergen Reaction Noted   Bee venom Anaphylaxis and Hives 09/06/2012   Molds & smuts Shortness Of Breath 11/20/2021   Shellfish allergy Anaphylaxis and Hives 09/06/2012   Avelox [moxifloxacin hcl in nacl] Other (See Comments) 11/22/2013   Tape Rash 11/20/2021    Family History  Adopted: Yes  Problem Relation Age of Onset   Cancer Mother        lung   Other Father    Other Daughter        Lyme's disease    Social History   Socioeconomic History   Marital status: Married    Spouse name: Not on file   Number of children: Not on file   Years of education: Not on file   Highest education level: Not on file  Occupational History   Not on file  Tobacco Use   Smoking status: Former    Packs/day: 1.00    Years: 20.00     Total pack years: 20.00    Types: Cigarettes    Quit date: 11/08/2003    Years since quitting: 18.7   Smokeless tobacco: Never   Tobacco comments:    quit smoking 10 yrs   Vaping Use   Vaping Use: Never used  Substance and Sexual Activity   Alcohol use: No   Drug use: No   Sexual activity: Yes    Birth control/protection: Post-menopausal  Other Topics Concern   Not on file  Social History Narrative   Not on file   Social Determinants of Health   Financial Resource Strain: Not on file  Food Insecurity: Not on file  Transportation Needs: Not on file  Physical Activity: Not on file  Stress: Not on file  Social Connections: Not on file   Review of systems General: negative for malaise, night sweats, fever, chills, weight loss Neck: Negative for lumps, goiter, pain and significant neck swelling Resp: Negative for cough, wheezing, dyspnea at rest CV: Negative for chest pain, leg swelling, palpitations, orthopnea GI: denies melena, hematochezia, nausea, vomiting, diarrhea, constipation, dysphagia, odyonophagia, early satiety or unintentional weight loss. +orange stools +bloating +GERD symptoms  MSK: Negative for joint pain or swelling, back pain, and muscle pain. Derm: Negative for itching or rash Psych: Denies depression, anxiety, memory loss, confusion. No homicidal or suicidal ideation.  Heme: Negative for prolonged bleeding, bruising easily, and swollen nodes. Endocrine: Negative for cold or heat intolerance, polyuria, polydipsia and goiter. Neuro: negative for tremor, gait imbalance, syncope and seizures. The remainder of the review of systems is noncontributory.  Physical Exam: BP (!) 159/97 (BP Location: Left Arm, Patient Position: Sitting, Cuff Size: Small)   Pulse 84   Temp 98.1 F (36.7 C) (Oral)   Ht '5\' 5"'$  (1.651 m)   Wt (!) 307 lb (139.3 kg)   LMP 01/23/2014 Comment: spotting  BMI 51.09 kg/m  General:   Alert and oriented. No distress noted. Pleasant and  cooperative.  Head:  Normocephalic and atraumatic. Eyes:  Conjuctiva clear without scleral icterus. Mouth:  Oral mucosa pink and moist. Good  dentition. No lesions. Heart: Normal rate and rhythm, s1 and s2 heart sounds present.  Lungs: Clear lung sounds in all lobes. Respirations equal and unlabored. Abdomen:  +BS, soft, non-tender and non-distended. No rebound or guarding. No HSM or masses noted. Derm: No palmar erythema or jaundice Msk:  Symmetrical without gross deformities. Normal posture. Extremities:  Without edema. Neurologic:  Alert and  oriented x4 Psych:  Alert and cooperative. Normal mood and affect.  Invalid input(s): "6 MONTHS"   ASSESSMENT: DAMEISHA TSCHIDA is a 60 y.o. female presenting today for ongoing bloating, orange stools and abdominal pain.  Patient continues to have abdominal bloating, LUQ pain and orange stools. Orange stools have been present for about 1 year, though pancreatic enzymes and LFTs were normal in may. She does still have GB in situ but denies any RUQ pain. CT A/P wo contrast in May was unremarkable. she denies any diarrhea. EGD earlier this year negative for celiac and H pylori, colonoscopy with multiple polyps, patient concerned about waiting 5 years for next colonoscopy though we discussed indications for this based on the guidelines. Etiology of orange stools is uncertain as she does not have any indication of biliary obstruction, she notably has Hashimoto's so is at increased risk for pancreatic insufficiency secondary to this.  Denies jaundice, or pruritus, does not drink alcohol. however, will updated LFTs and Obtain US abdomen complete for further evaluation, may consider pancreatic fecal elastase and fecal fat testing if [place] is unrevealing.   In regards to uncontrolled GERD, she is maintained on Pepcid for both reflux and allergies. Will continue with pepcid '20mg'$  in the morning and increase to '40mg'$  QHS, she should make sure she is implementing reflux  precautions to include Avoiding greasy, spicy, fried, citrus foods, and be mindful that caffeine, carbonated drinks, chocolate and alcohol can increase reflux symptoms, Stay upright 2-3 hours after eating, prior to lying down and avoid eating late in the evenings. If GERD is still uncontrolled with increase in H2B, will need to consider restarting PPI as she was previously on this in the past.    PLAN:  Pepcid '20mg'$  in the morning and increase to '40mg'$   2. Otc beano/simethicone  3. [place] complete abdomen 4. Reflux precautions  5. Continue daily probiotic 6. Consider pancreatic elastase testing  All questions were answered, patient verbalized understanding and is in agreement with plan as outlined above.    Follow Up: 3 months   Summer Worm L. Alver Sorrow, MSN, APRN, AGNP-C Adult-Gerontology Nurse Practitioner [facility]

## 2022-08-12 ENCOUNTER — Ambulatory Visit (HOSPITAL_COMMUNITY)
Admission: RE | Admit: 2022-08-12 | Discharge: 2022-08-12 | Disposition: A | Discharge status: Home / Self Care | Payer: 59 | Source: Ambulatory Visit | Attending: Gastroenterology | Admitting: Gastroenterology

## 2022-08-12 DIAGNOSIS — R195 Other fecal abnormalities: Secondary | ICD-10-CM | POA: Insufficient documentation

## 2022-08-12 DIAGNOSIS — R1012 Left upper quadrant pain: Secondary | ICD-10-CM | POA: Diagnosis not present

## 2022-08-12 DIAGNOSIS — K824 Cholesterolosis of gallbladder: Secondary | ICD-10-CM | POA: Diagnosis not present

## 2022-08-13 ENCOUNTER — Other Ambulatory Visit: Payer: Self-pay

## 2022-08-13 ENCOUNTER — Emergency Department (HOSPITAL_COMMUNITY)
Admission: EM | Admit: 2022-08-13 | Discharge: 2022-08-13 | Disposition: A | Discharge status: Home / Self Care | Payer: 59 | Attending: Emergency Medicine | Admitting: Emergency Medicine

## 2022-08-13 ENCOUNTER — Ambulatory Visit: Payer: 59 | Admitting: Family Medicine

## 2022-08-13 ENCOUNTER — Encounter (HOSPITAL_COMMUNITY): Payer: Self-pay

## 2022-08-13 ENCOUNTER — Emergency Department (HOSPITAL_COMMUNITY): Payer: 59

## 2022-08-13 DIAGNOSIS — R0789 Other chest pain: Secondary | ICD-10-CM | POA: Insufficient documentation

## 2022-08-13 DIAGNOSIS — R42 Dizziness and giddiness: Secondary | ICD-10-CM | POA: Insufficient documentation

## 2022-08-13 DIAGNOSIS — J45909 Unspecified asthma, uncomplicated: Secondary | ICD-10-CM | POA: Diagnosis not present

## 2022-08-13 DIAGNOSIS — R079 Chest pain, unspecified: Secondary | ICD-10-CM | POA: Diagnosis not present

## 2022-08-13 LAB — CBC
HCT: 50.2 % — ABNORMAL HIGH (ref 36.0–46.0)
Hemoglobin: 16.9 g/dL — ABNORMAL HIGH (ref 12.0–15.0)
MCH: 30.7 pg (ref 26.0–34.0)
MCHC: 33.7 g/dL (ref 30.0–36.0)
MCV: 91.1 fL (ref 80.0–100.0)
Platelets: 261 10*3/uL (ref 150–400)
RBC: 5.51 MIL/uL — ABNORMAL HIGH (ref 3.87–5.11)
RDW: 15 % (ref 11.5–15.5)
WBC: 8 10*3/uL (ref 4.0–10.5)
nRBC: 0 % (ref 0.0–0.2)

## 2022-08-13 LAB — BASIC METABOLIC PANEL
Anion gap: 8 (ref 5–15)
BUN: 15 mg/dL (ref 6–20)
CO2: 28 mmol/L (ref 22–32)
Calcium: 9.1 mg/dL (ref 8.9–10.3)
Chloride: 105 mmol/L (ref 98–111)
Creatinine, Ser: 0.64 mg/dL (ref 0.44–1.00)
GFR, Estimated: >60 mL/min (ref >60)
Glucose, Bld: 126 mg/dL — ABNORMAL HIGH (ref 70–99)
Potassium: 3.8 mmol/L (ref 3.5–5.1)
Sodium: 141 mmol/L (ref 135–145)

## 2022-08-13 LAB — TROPONIN I (HIGH SENSITIVITY)
Troponin I (High Sensitivity): 16 ng/L (ref <18)
Troponin I (High Sensitivity): 15 ng/L (ref <18)

## 2022-08-13 LAB — BRAIN NATRIURETIC PEPTIDE: B Natriuretic Peptide: 24 pg/mL (ref 0.0–100.0)

## 2022-08-13 MED ORDER — PREDNISONE 20 MG PO TABS: 40.0000 mg | ORAL_TABLET | Freq: Every day | ORAL | 0 refills | Status: DC

## 2022-08-13 NOTE — ED Triage Notes (Signed)
CP, weakness. CP started yesterday that is continuous but has less pain that yesterday. Pt reports getting "winded when ambulating."

## 2022-08-13 NOTE — ED Provider Notes (Signed)
[facility] EMERGENCY DEPARTMENT Provider Note   CSN: [id number] Arrival date & time: 08/13/22  1120     History  No chief complaint on file.   Summer [name] is a 61 y.o. female.  HPI Patient presents with chest tightness and lightheadedness since yesterday.  Patient acknowledges a history of asthma, diastolic dysfunction, but was generally well until yesterday.  While at work, without obvious precipitant, she began feeling lightheaded, with some dyspnea.  Symptoms have persisted until today.  Symptoms are, however, somewhat better.  Chest pressure is diffuse.  No relief, clearly with anything.  No fever, nausea, vomiting. No recent changes in medication, diet, activity.    Home Medications Prior to Admission medications   Medication Sig Start Date End Date Taking? Authorizing Provider  albuterol (PROVENTIL) (2.5 MG/3ML) 0.083% nebulizer solution Take 3 mLs (2.5 mg total) by nebulization every 6 (six) hours as needed for wheezing or shortness of breath. 05/21/22  Yes Valentina Shaggy, MD  albuterol (VENTOLIN HFA) 108 (90 Base) MCG/ACT inhaler Inhale 2 puffs into the lungs every 6 (six) hours as needed for wheezing or shortness of breath. 05/21/22  Yes Valentina Shaggy, MD  ALPRAZolam Duanne Moron) 0.5 MG tablet Take 1 tablet by mouth twice daily as needed for anxiety 06/28/22  Yes Luking, Elayne Snare, MD  Ascorbic Acid (VITAMIN C) 500 MG CAPS Take 1 capsule by mouth daily.   Yes [provider]  B Complex-C (SUPER B COMPLEX PO) Take by mouth daily at 6 (six) AM.   Yes [provider]  Bacillus Coagulans-Inulin (PROBIOTIC) 1-250 BILLION-MG CAPS Take 1 capsule by mouth daily.   Yes [provider]  cetirizine (ZYRTEC) 10 MG tablet Take 1 tablet (10 mg total) by mouth daily as needed for allergies (Can take an extra dose during flare ups.). Patient taking differently: Take 10 mg by mouth 2 (two) times daily. 05/21/22  Yes Valentina Shaggy, MD   Cholecalciferol (VITAMIN D-3) 5000 UNITS TABS Take 1 tablet by mouth daily.   Yes [provider]  cromolyn (INTAL) 20 MG/2ML nebulizer solution Take 2 mLs (20 mg total) by nebulization in the morning, at noon, in the evening, and at bedtime. 05/21/22  Yes Valentina Shaggy, MD  EPINEPHrine 0.3 mg/0.3 mL IJ SOAJ injection Inject 0.3 mg into the muscle as needed for anaphylaxis. 05/21/22  Yes Valentina Shaggy, MD  famotidine (PEPCID) 20 MG tablet Take 1 tablet (20 mg total) by mouth 2 (two) times daily. 05/21/22  Yes Valentina Shaggy, MD  Magnesium 200 MG TABS Take 1 tablet by mouth daily.   Yes [provider]  nystatin cream (MYCOSTATIN) Apply 1 Application topically 2 (two) times daily. Patient taking differently: Apply 1 Application topically as needed for dry skin. 08/02/22  Yes Ameduite, Trenton Gammon, NP  OLIVE LEAF PO Take 1 tablet by mouth daily.   Yes [provider]  OVER THE COUNTER MEDICATION as needed. Red root   Yes [provider]  potassium chloride SA (KLOR-CON M) 20 MEQ tablet Take 1 tablet by mouth once daily 12/21/21  Yes Luking, Elayne Snare, MD  predniSONE (DELTASONE) 20 MG tablet Take 2 tablets (40 mg total) by mouth daily with breakfast. For the next four days 08/13/22  Yes Carmin Muskrat, MD  rosuvastatin (CRESTOR) 5 MG tablet Take 1 tablet (5 mg total) by mouth daily. 03/30/22  Yes Kathyrn Drown, MD  thyroid (NP THYROID) 60 MG tablet TAKE 1 TABLET BY MOUTH ONCE  DAILY BEFORE BREAKFAST 03/30/22  Yes Kathyrn Drown, MD  furosemide (LASIX) 20 MG tablet TAKE 2 TABLETS BY MOUTH IN THE MORNING AS NEEDED 12/21/21   Kathyrn Drown, MD  medroxyPROGESTERone (PROVERA) 10 MG tablet 10 days daily each month 05/14/22   Janyth Pupa, DO      Allergies    Bee venom, Molds & smuts, Shellfish allergy, Avelox [moxifloxacin hcl in nacl], and Tape    Review of Systems   Review of Systems  All other systems reviewed and are negative.   Physical  Exam Updated Vital Signs BP 139/74   Pulse 74   Temp 98.2 F (36.8 C) (Oral)   Resp 12   Ht '5\' 5"'$  (1.651 m)   Wt (!) 138.3 kg   LMP 01/23/2014 Comment: spotting  SpO2 100%   BMI 50.75 kg/m  Physical Exam Vitals and nursing note reviewed.  Constitutional:      General: She is not in acute distress.    Appearance: She is well-developed. She is obese.  HENT:     Head: Normocephalic and atraumatic.  Eyes:     Conjunctiva/sclera: Conjunctivae normal.  Cardiovascular:     Rate and Rhythm: Regular rhythm. Tachycardia present.  Pulmonary:     Effort: Pulmonary effort is normal. No respiratory distress.     Breath sounds: Normal breath sounds. No stridor.  Abdominal:     General: There is no distension.  Skin:    General: Skin is warm and dry.  Neurological:     Mental Status: She is alert and oriented to person, place, and time.     Cranial Nerves: No cranial nerve deficit.  Psychiatric:        Mood and Affect: Mood normal.     ED Results / Procedures / Treatments   Labs (all labs ordered are listed, but only abnormal results are displayed) Labs Reviewed  BASIC METABOLIC PANEL - Abnormal; Notable for the following components:      Result Value   Glucose, Bld 126 (*)    All other components within normal limits  CBC - Abnormal; Notable for the following components:   RBC 5.51 (*)    Hemoglobin 16.9 (*)    HCT 50.2 (*)    All other components within normal limits  BRAIN NATRIURETIC PEPTIDE  TROPONIN I (HIGH SENSITIVITY)  TROPONIN I (HIGH SENSITIVITY)    EKG EKG Interpretation  Date/Time:  Friday August 13 2022 11:41:16 EDT Ventricular Rate:  82 PR Interval:  154 QRS Duration: 72 QT Interval:  366 QTC Calculation: 427 R Axis:   -13 Text Interpretation: Normal sinus rhythm Low voltage QRS Borderline ECG Confirmed by Carmin Muskrat [phone]) on 08/13/2022 12:28:58 PM  Radiology DG Chest 2 View  Result Date: 08/13/2022 CLINICAL DATA:  Chest pain EXAM: CHEST  - 2 VIEW COMPARISON:  04/08/2022 FINDINGS: Cardiac and mediastinal contours are within normal limits. No focal pulmonary opacity. No pleural effusion or pneumothorax. No acute osseous abnormality. IMPRESSION: No acute cardiopulmonary process. Electronically Signed   By: Merilyn Baba M.D.   On: 08/13/2022 12:35   US Abdomen Complete  Result Date: 08/13/2022 CLINICAL DATA:  Lower abdominal pain EXAM: ABDOMEN ULTRASOUND COMPLETE COMPARISON:  CT abdomen pelvis Mar 25, 2022 FINDINGS: Gallbladder: Gallbladder polyp measuring up to 4 mm. No gallbladder wall thickening or pericholecystic fluid. Negative sonographic Murphy's sign. Common bile duct: Diameter: 4 mm Liver: Increased echogenicity. No focal lesion. Portal vein is patent on color Doppler imaging with normal direction of blood  flow towards the liver. IVC: No abnormality visualized. Pancreas: Not well visualized Spleen: Size and appearance within normal limits. Right Kidney: Length: 13.0 cm. Echogenicity within normal limits. No mass or hydronephrosis visualized. Note is made of a 6 cm cyst. Left Kidney: Length: 12.0 cm. Echogenicity within normal limits. No mass or hydronephrosis visualized. Abdominal aorta: Not well visualized. Other findings: None. IMPRESSION: 1. Increased hepatic parenchymal echogenicity suggestive of steatosis. 2. No cholelithiasis or sonographic evidence for acute cholecystitis. Electronically Signed   By: Lovey Newcomer M.D.   On: 08/13/2022 06:33    Procedures Procedures    Medications Ordered in ED Medications - No data to display  ED Course/ Medical Decision Making/ A&P This patient with a Hx of obesity, asthma, fibromyalgia, ongoing abdominal discomfort presents to the ED for concern of chest tightness, lightheadedness, near syncope, this involves an extensive number of treatment options, and is a complaint that carries with it a high risk of complications and morbidity.    The differential diagnosis includes arrhythmia,  heart failure exacerbation, less likely ACS, dehydration.   Social Determinants of Health:  Morbid obesity  Additional history obtained:  Additional history and/or information obtained from chart review, notable for ultrasound results from yesterday reviewed, as above, steatosis   After the initial evaluation, orders, including: Labs x-ray were initiated.   Patient placed on Cardiac and Pulse-Oximetry Monitors. The patient was maintained on a cardiac monitor.  The cardiac monitored showed an rhythm of 85 sinus normal The patient was also maintained on pulse oximetry. The readings were typically 100% room air normal   On repeat evaluation of the patient stayed the same  Lab Tests:  I personally interpreted labs.  The pertinent results include: Unremarkable labs, normal troponin x2, no change, BNP 24  Imaging Studies ordered:  I independently visualized and interpreted imaging which showed no pneumonia on x-ray, and I discussed this, illustrated in the images to her at bedside I agree with the radiologist interpretation   3:19 PM  Dispostion / Final MDM:  After consideration of the diagnostic results and the patient's response to treatment, it is appropriate for discharge.  This adult female, obese, with autoimmune disease, recent ultrasound which I reviewed with her demonstrating hepatic steatosis presents with tightness, dyspnea.  Patient also has fibromyalgia and asthma.  Here no evidence for acute new pathology such as pneumonia, bacteremia, sepsis.  She is not tachycardic, tachypneic, hypoxic with no increased work of breathing, low risk PE.  Patient does have outpatient follow-up scheduled in 6 days, appropriate for discharge, with ongoing steroids, bronchodilators, return precautions.  Final Clinical Impression(s) / ED Diagnoses Final diagnoses:  Atypical chest pain    Rx / DC Orders ED Discharge Orders          Ordered    predniSONE (DELTASONE) 20 MG tablet  Daily  with breakfast        08/13/22 1518              Carmin Muskrat, MD 08/13/22 1519

## 2022-08-13 NOTE — Discharge Instructions (Signed)
As discussed, your evaluation today has been largely reassuring.  But, it is important that you monitor your condition carefully, and do not hesitate to return to the ED if you develop new, or concerning changes in your condition.  For the next 2 days please use your albuterol nebulizer every 4 hours.  Then use it as needed.  This in addition to the prescribed steroids that should improve your condition.  Please follow-up with your physician for appropriate ongoing care.

## 2022-08-13 NOTE — ED Notes (Signed)
Patient transported to X-ray 

## 2022-08-19 ENCOUNTER — Ambulatory Visit: Payer: 59 | Admitting: Family Medicine

## 2022-08-24 ENCOUNTER — Ambulatory Visit (INDEPENDENT_AMBULATORY_CARE_PROVIDER_SITE_OTHER): Payer: 59 | Admitting: Nurse Practitioner

## 2022-08-24 VITALS — Ht 64.0 in | Wt 306.6 lb

## 2022-08-24 DIAGNOSIS — R310 Gross hematuria: Secondary | ICD-10-CM

## 2022-08-24 DIAGNOSIS — R319 Hematuria, unspecified: Secondary | ICD-10-CM

## 2022-08-24 DIAGNOSIS — Z1231 Encounter for screening mammogram for malignant neoplasm of breast: Secondary | ICD-10-CM | POA: Diagnosis not present

## 2022-08-24 DIAGNOSIS — K625 Hemorrhage of anus and rectum: Secondary | ICD-10-CM | POA: Diagnosis not present

## 2022-08-24 LAB — POCT URINALYSIS DIPSTICK
Spec Grav, UA: 1.015 (ref 1.010–1.025)
pH, UA: 7 (ref 5.0–8.0)

## 2022-08-24 MED ORDER — SULFAMETHOXAZOLE-TRIMETHOPRIM 800-160 MG PO TABS: 1.0000 | ORAL_TABLET | Freq: Two times a day (BID) | ORAL | 0 refills | Status: AC

## 2022-08-24 NOTE — Progress Notes (Signed)
Subjective:    Patient ID: Summer [name], female    DOB: 18-Apr-1961, 61 y.o.   MRN: [id number]  HPI  Patient arrives with blood in her urine. Patient states she had a few days of antibiotic at home and has taken them the last 2 days.  Patient also admits to dysuria and urinary frequency and urgency.  Patient denies any body aches, chills, fevers, new back pain.  Patient also states that she is also bleeding from her rectum that she notices when she wipes.  Patient has history of hemorrhoids and was recently seen by GI.   Results for orders placed or performed in visit on 08/24/22  POCT urinalysis dipstick  Result Value Ref Range   Color, UA     Clarity, UA     Glucose, UA     Bilirubin, UA     Ketones, UA     Spec Grav, UA 1.015 1.010 - 1.025   Blood, UA large    pH, UA 7.0 5.0 - 8.0   Protein, UA     Urobilinogen, UA     Nitrite, UA     Leukocytes, UA     Appearance     Odor       Review of Systems  Gastrointestinal:  Positive for anal bleeding.  Genitourinary:  Positive for dysuria, frequency and urgency.  All other systems reviewed and are negative.      Objective:   Physical Exam Vitals reviewed.  Constitutional:      General: She is not in acute distress.    Appearance: Normal appearance. She is normal weight. She is not ill-appearing, toxic-appearing or diaphoretic.  HENT:     Head: Normocephalic and atraumatic.  Cardiovascular:     Rate and Rhythm: Normal rate and regular rhythm.     Pulses: Normal pulses.     Heart sounds: Normal heart sounds. No murmur heard. Pulmonary:     Effort: Pulmonary effort is normal. No respiratory distress.     Breath sounds: Normal breath sounds. No wheezing.  Abdominal:     General: Abdomen is flat. There is no distension.     Palpations: Abdomen is soft. There is no mass.     Tenderness: There is no abdominal tenderness. There is no right CVA tenderness, left CVA tenderness, guarding or rebound.     Hernia: No hernia  is present.  Musculoskeletal:     Comments: Grossly intact  Skin:    General: Skin is warm.     Capillary Refill: Capillary refill takes less than 2 seconds.  Neurological:     Mental Status: She is alert.     Comments: Grossly intact  Psychiatric:        Mood and Affect: Mood normal.        Behavior: Behavior normal.           Assessment & Plan:  1. Hematuria, unspecified type - Etiology of hematuria unknown. Suspect UTI - Considered renal cysts, however, renal cyst do not typically cause hematuria.  - Will not send in Urine culture in today since patient has already started taking abx and because POCT UA negative for leukocytes and nitrates - Will send patient back to urology for further evaluation. Encouraged patient to call urology office to schedule appointment - POCT urinalysis dipstick = negative for leukocytes and nitrites - sulfamethoxazole-trimethoprim (BACTRIM DS) 800-160 MG tablet; Take 1 tablet by mouth 2 (two) times daily for 3 days.  Dispense: 6 tablet; Refill: 0 -  Ambulatory referral to Urology - If symptoms not better within 2-3 days return to clinic. May consider urine culture at that time.   2. Rectal bleeding -Suspect hemorrhoids - Avoid constipation by eating plenty fiber - Call GI to schedule follow up appointment  3. Encounter for screening mammogram for malignant neoplasm of breast - MM DIGITAL SCREENING BILATERAL    Note:  This document was prepared using Dragon voice recognition software and may include unintentional dictation errors. Note - This record has been created using [employer].  Chart creation errors have been sought, but may not always  have been located. Such creation errors do not reflect on  the standard of medical care.

## 2022-08-24 NOTE — Patient Instructions (Signed)
[facility] [address], [place] [phone]

## 2022-08-25 ENCOUNTER — Encounter: Payer: Self-pay | Admitting: Nurse Practitioner

## 2022-08-25 ENCOUNTER — Encounter: Payer: Self-pay | Admitting: Allergy & Immunology

## 2022-08-25 ENCOUNTER — Ambulatory Visit (INDEPENDENT_AMBULATORY_CARE_PROVIDER_SITE_OTHER): Payer: 59 | Admitting: Family

## 2022-08-25 VITALS — BP 130/78 | HR 78 | Temp 97.7°F | Resp 16

## 2022-08-25 DIAGNOSIS — T7840XD Allergy, unspecified, subsequent encounter: Secondary | ICD-10-CM | POA: Diagnosis not present

## 2022-08-25 DIAGNOSIS — J310 Chronic rhinitis: Secondary | ICD-10-CM

## 2022-08-25 DIAGNOSIS — J4540 Moderate persistent asthma, uncomplicated: Secondary | ICD-10-CM

## 2022-08-25 DIAGNOSIS — J3089 Other allergic rhinitis: Secondary | ICD-10-CM | POA: Diagnosis not present

## 2022-08-25 DIAGNOSIS — L508 Other urticaria: Secondary | ICD-10-CM | POA: Diagnosis not present

## 2022-08-25 DIAGNOSIS — K219 Gastro-esophageal reflux disease without esophagitis: Secondary | ICD-10-CM | POA: Diagnosis not present

## 2022-08-25 DIAGNOSIS — L299 Pruritus, unspecified: Secondary | ICD-10-CM | POA: Diagnosis not present

## 2022-08-25 DIAGNOSIS — E063 Autoimmune thyroiditis: Secondary | ICD-10-CM | POA: Diagnosis not present

## 2022-08-25 MED ORDER — IPRATROPIUM BROMIDE 0.03 % NA SOLN: NASAL | 3 refills | Status: DC

## 2022-08-25 MED ORDER — BUDESONIDE-FORMOTEROL FUMARATE 80-4.5 MCG/ACT IN AERO: INHALATION_SPRAY | RESPIRATORY_TRACT | 3 refills | Status: DC

## 2022-08-25 MED ORDER — PANTOPRAZOLE SODIUM 20 MG PO TBEC: DELAYED_RELEASE_TABLET | ORAL | 0 refills | Status: DC

## 2022-08-25 NOTE — Patient Instructions (Addendum)
1. Perennial allergic rhinitis - indoor molds - Continue with: Zyrtec (cetirizine) '10mg'$  tablet twice daily and Pepcid (famotidine) '40mg'$  twice daily - Continue with: Singulair (montelukast) '10mg'$  daily -Start ipratropium bromide 1 spray in each nostril up to 3 times a day as needed for runny drippy nose. Caution as this can be drying  2. Allergic reaction - possibly related to mold exposure (no reactions since last office visit - Xolair might help (information provided previously) which can prevent allergic reactions.  - EpiPen is up to date.  3. Shortness of breath - Lung testing looks stable.  - Daily controller medication(s): Start Symbicort 80/4.105mg two puffs twice daily with spacer and continue nebulizer cromolyn four times daily  - Prior to physical activity: albuterol 2 puffs 10-15 minutes before physical activity. - Rescue medications: albuterol 4 puffs every 4-6 hours as needed and albuterol nebulizer one vial every 4-6 hours as needed - Asthma control goals:  * Full participation in all desired activities (may need albuterol before activity) * Albuterol use two time or less a week on average (not counting use with activity) * Cough interfering with sleep two time or less a month * Oral steroids no more than once a year * No hospitalizations  4. Hashimoto's thyroiditis  - We referred you again to Endocrinology. Let [place] if this referral is not good and we can put in another referral    5. Reflux Start a 30 day trial of pantoprazole  20 mg in the morning  Schedule a follow up appointment in 2 months or sooner if needed

## 2022-08-25 NOTE — Progress Notes (Deleted)
FOLLOW UP  Date of Service/Encounter:  08/25/22   Assessment:   Perennial allergic rhinitis (indoor molds)   Allergic reaction - unknown trigger   Chronic urticaria - despite daily antihistamines and montelukast   Burning of back with anxiety   Shortness of breath - starting controller medication for asthma to see if this helps  Plan/Recommendations:    There are no Patient Instructions on file for this visit.   Subjective:   Summer [name] is a 61 y.o. female presenting today for follow up of  Chief Complaint  Patient presents with   Asthma    Not so great, she was in the hospital about 2 wks ago. Had chest pain and SOB.Is still having the same symptoms. Sometimes when she wakes up in the morning she is wheezing.     Allergic Rhinitis    Other    Cromolyn is helping.     Summer [name] has a history of the following: Patient Active Problem List   Diagnosis Date Noted   Orange stool 08/05/2022   Carpal tunnel syndrome 05/18/2022   Spinal stenosis 05/18/2022   Lower abdominal pain 04/28/2022   Constipation 04/06/2022   DOE (dyspnea on exertion) 01/18/2022   Emphysema lung ([facility]) 01/15/2022   Hyperlipidemia 01/15/2022   Elevated hemoglobin ([facility]) 01/15/2022   Morbid obesity ([facility]) 05/28/2021   LUQ pain 12/17/2020   Prediabetes 06/10/2014   Hypothyroidism 06/10/2014   Gastroesophageal reflux disease 04/01/2013   Fibromyalgia 03/22/2013    History obtained from: chart review and {Persons; PED relatives w/patient:19415::"patient"}.  Summer [name] is a 61 y.o. female presenting for {Blank single:19197::"a food challenge","a drug challenge","skin testing","a sick visit","an evaluation of ***","a follow up visit"}.  She was admitted to the hospital in October for chest pain. Cardiac workup was negative.   {Blank single:19197::"Asthma/Respiratory Symptom History: ***"," "}  {Blank single:19197::"Allergic Rhinitis Symptom History: ***"," "}  {Blank  single:19197::"Food Allergy Symptom History: ***"," "}  {Blank single:19197::"Skin Symptom History: ***"," "}  {Blank single:19197::"GERD Symptom History: ***"," "}  Otherwise, there have been no changes to her past medical history, surgical history, family history, or social history.    ROS     Objective:   Blood pressure 130/78, pulse 78, temperature 97.7 F (36.5 C), resp. rate 16, last menstrual period 01/23/2014, SpO2 98 %. There is no height or weight on file to calculate BMI.    Physical Exam   Diagnostic studies: {Blank single:19197::"none","deferred due to recent antihistamine use","labs sent instead"," "}  Spirometry: {Blank single:19197::"results normal (FEV1: ***%, FVC: ***%, FEV1/FVC: ***%)","results abnormal (FEV1: ***%, FVC: ***%, FEV1/FVC: ***%)"}.    {Blank single:19197::"Spirometry consistent with mild obstructive disease","Spirometry consistent with moderate obstructive disease","Spirometry consistent with severe obstructive disease","Spirometry consistent with possible restrictive disease","Spirometry consistent with mixed obstructive and restrictive disease","Spirometry uninterpretable due to technique","Spirometry consistent with normal pattern"}. {Blank single:19197::"Albuterol/Atrovent nebulizer","Xopenex/Atrovent nebulizer","Albuterol nebulizer","Albuterol four puffs via MDI","Xopenex four puffs via MDI"} treatment given in clinic with {Blank single:19197::"significant improvement in FEV1 per ATS criteria","significant improvement in FVC per ATS criteria","significant improvement in FEV1 and FVC per ATS criteria","improvement in FEV1, but not significant per ATS criteria","improvement in FVC, but not significant per ATS criteria","improvement in FEV1 and FVC, but not significant per ATS criteria","no improvement"}.  Allergy Studies: {Blank single:19197::"none","labs sent instead"," "}    {Blank single:19197::"Allergy testing results were read and interpreted  by myself, documented by clinical staff."," "}      Salvatore Marvel, MD  [facility]

## 2022-08-25 NOTE — Progress Notes (Signed)
[address], [address] [place] [address] Dept: [phone]  FOLLOW UP NOTE  Patient ID: Summer [name], female    DOB: 09-Feb-1961  Age: 61 y.o. MRN: [id number] Date of Office Visit: 08/25/2022  Assessment  Chief Complaint: Asthma (Not so great, she was in the hospital about 2 wks ago. Had chest pain and SOB.Is still having the same symptoms. Sometimes when she wakes up in the morning she is wheezing. /), Allergic Rhinitis , and Other (Cromolyn is helping. )  HPI Summer [name] is a 61 year old female who presents today for follow-up of perennial allergic rhinitis, allergic reaction, shortness of breath, and Hashimoto's thyroiditis.  She was last seen on May 21, 2022 by Dr. Ernst Bowler and she denies any new diagnosis or surgery since her last office visit, but does mention that she went to the emergency room room recently due to chest pain and was told that the pain was not due to her heart.  She was given prednisone while at the emergency room.  Allergic rhinitis: she reports a little bit of clear rhinorrhea, nasal congestion, and post nasal drip. She has not had any sinus infections since we last saw her. She continues to take Zyrtec 10 mg twice a day and Pepcid 40 mg twice a day. She also takes cromolyn via nebulizer twice a day.  Allergic reactions: She reports that she has not had any allergic reactions since her last office visit.  She does have an epinephrine autoinjector device.  She does mention since taking the cromolyn that smells do not seem to bother her as much.  She is approximately 40% better.  She normally smells of alcohol when she walks in our office.  This will cause her skin to feel like it is burning, her chest will become tight, and her throat will become tight also.  She does occasionally have itchy skin, but denies any rashes.  Shortness of breath: She has albuterol to use as needed.  She has been using 2-3 times a week.  She was previously on Symbicort  80/4.5 mcg but it did not seem to help.  She felt that albuterol helped more.  Discussed how when she used Symbicort that you would not build to tell a difference in your breathing like you do with albuterol.  Instructed that it does take up to 6 to 8 weeks to get maximum benefit from Symbicort.  She is interested in retrying Symbicort 80/4.5 mcg.  She reports occasional dry cough, some wheezing, some tightness in her chest, shortness of breath, and nocturnal awakenings due to breathing problems sometimes.  Since her last office visit she has been on 1 round of steroids from her recent emergency room visit for chest pain.  She did have a chest x-ray while at the emergency room on August 13, 2022 showing: "FINDINGS:  Cardiac and mediastinal contours are within normal limits. No focal  pulmonary opacity. No pleural effusion or pneumothorax. No acute  osseous abnormality.   IMPRESSION:  No acute cardiopulmonary process"  She reports heartburn that occurs daily in the morning.  She continues to take Pepcid 40 mg twice a day.  She has tried omeprazole in the past and reports that it did not help.  She is willing to try a 30-day trial of pantoprazole to see if this helps with the heartburn.  She has not made an appointment with endocrinology for her Hashimoto's due to having other health issues.  She is going to call their office  to see if she can still make an appointment and if she cannot she will give her office a call.   Drug Allergies:  Allergies  Allergen Reactions   Bee Venom Anaphylaxis and Hives   Molds & Smuts Shortness Of Breath   Shellfish Allergy Anaphylaxis and Hives   Avelox [Moxifloxacin Hcl In Nacl] Other (See Comments)    "heart beats weird"   Tape Rash    Review of Systems: Review of Systems  Constitutional:  Negative for chills and fever.  HENT:         Reports a little bit of rhinorrhea and nasal congestion.  She also has a little bit of postnasal drip.  Eyes:         Reports itchy watery eyes  Respiratory:  Positive for cough, shortness of breath and wheezing.        Reports occasional dry cough, some wheezing, tightness in her chest, some shortness of breath, and nocturnal awakenings sometimes due to breathing problems  Cardiovascular:  Positive for chest pain. Negative for palpitations.       Reports chest pain for which she recently went to the emergency room and was told that it was not her heart.  She does have an upcoming appointment with her cardiologist in January  Gastrointestinal:  Positive for heartburn.       Reports heartburn in the morning  Genitourinary:  Positive for frequency.  Skin:  Positive for itching. Negative for rash.       Reports itching at times.  Denies rashes  Endo/Heme/Allergies:  Positive for environmental allergies.     Physical Exam: BP 130/78   Pulse 78   Temp 97.7 F (36.5 C)   Resp 16   LMP 01/23/2014 Comment: spotting  SpO2 98%    Physical Exam Constitutional:      Appearance: Normal appearance.  HENT:     Head: Normocephalic and atraumatic.     Comments: Pharynx normal, eyes normal, ears normal, nose normal    Right Ear: Tympanic membrane, ear canal and external ear normal.     Left Ear: Tympanic membrane, ear canal and external ear normal.     Nose: Nose normal.     Mouth/Throat:     Mouth: Mucous membranes are moist.     Pharynx: Oropharynx is clear.  Eyes:     Conjunctiva/sclera: Conjunctivae normal.  Cardiovascular:     Rate and Rhythm: Normal rate and regular rhythm.     Heart sounds: Normal heart sounds.  Pulmonary:     Effort: Pulmonary effort is normal.     Breath sounds: Normal breath sounds.     Comments: Lungs clear to auscultation Musculoskeletal:     Cervical back: Neck supple.  Skin:    General: Skin is warm.     Comments: No rashes or urticarial lesions noted on exposed skin  Neurological:     Mental Status: She is alert and oriented to person, place, and time.  Psychiatric:         Mood and Affect: Mood normal.        Behavior: Behavior normal.        Thought Content: Thought content normal.        Judgment: Judgment normal.     Diagnostics: FVC 2.32 L (70%), FEV1 1.85 L (71%).  Predicted FVC 3.30 L, predicted FEV1 2.60 L.  Spirometry indicates possible mild restriction.  Spirometry is slightly improved from previous spirometry  Assessment and Plan: 1. Moderate persistent asthma, uncomplicated  2. Gustatory rhinitis   3. Allergic reaction, subsequent encounter   4. Perennial allergic rhinitis   5. Gastroesophageal reflux disease, unspecified whether esophagitis present   6. Chronic urticaria   7. Pruritus   8. Hashimoto's disease     Meds ordered this encounter  Medications   pantoprazole (PROTONIX) 20 MG tablet    Sig: Take one tablet 30 minutes before breakfast    Dispense:  30 tablet    Refill:  0   budesonide-formoterol (SYMBICORT) 80-4.5 MCG/ACT inhaler    Sig: Inhale 2 puffs twice a day with spacer to help prevent cough and wheeze    Dispense:  1 each    Refill:  3   ipratropium (ATROVENT) 0.03 % nasal spray    Sig: Place 1 spray in each nostril up to 3 times a day as needed for runny nose    Dispense:  30 mL    Refill:  3    Patient Instructions  1. Perennial allergic rhinitis - indoor molds - Continue with: Zyrtec (cetirizine) '10mg'$  tablet twice daily and Pepcid (famotidine) '40mg'$  twice daily - Continue with: Singulair (montelukast) '10mg'$  daily -Start ipratropium bromide 1 spray in each nostril up to 3 times a day as needed for runny drippy nose. Caution as this can be drying  2. Allergic reaction - possibly related to mold exposure (no reactions since last office visit - Xolair might help (information provided previously) which can prevent allergic reactions.  - EpiPen is up to date.  3. Shortness of breath - Lung testing looks stable.  - Daily controller medication(s): Start Symbicort 80/4.52mg two puffs twice daily with spacer and  continue nebulizer cromolyn four times daily  - Prior to physical activity: albuterol 2 puffs 10-15 minutes before physical activity. - Rescue medications: albuterol 4 puffs every 4-6 hours as needed and albuterol nebulizer one vial every 4-6 hours as needed - Asthma control goals:  * Full participation in all desired activities (may need albuterol before activity) * Albuterol use two time or less a week on average (not counting use with activity) * Cough interfering with sleep two time or less a month * Oral steroids no more than once a year * No hospitalizations  4. Hashimoto's thyroiditis  - We referred you again to Endocrinology. Let [place] if this referral is not good and we can put in another referral    5. Reflux Start a 30 day trial of pantoprazole  20 mg in the morning  Schedule a follow up appointment in 2 months or sooner if needed   Return in about 2 months (around 10/25/2022), or if symptoms worsen or fail to improve.    Thank you for the opportunity to care for this patient.  Please do not hesitate to contact me with questions.  CAlthea Charon FNP [facility]

## 2022-08-26 ENCOUNTER — Telehealth: Payer: Self-pay | Admitting: Nurse Practitioner

## 2022-08-26 ENCOUNTER — Other Ambulatory Visit: Payer: Self-pay | Admitting: Family Medicine

## 2022-08-26 NOTE — Telephone Encounter (Signed)
Patient was seen yesterday and give a number for sleep study but when she called its states no longer in service. Please advise

## 2022-08-26 NOTE — Telephone Encounter (Signed)
Left message to return call 

## 2022-08-30 NOTE — Telephone Encounter (Signed)
[facility]: [phone] - per [facility] website  Patient notified

## 2022-09-06 ENCOUNTER — Ambulatory Visit: Payer: 59 | Admitting: Urology

## 2022-09-06 ENCOUNTER — Encounter: Payer: Self-pay | Admitting: Urology

## 2022-09-06 VITALS — BP 142/83 | HR 90

## 2022-09-06 DIAGNOSIS — R310 Gross hematuria: Secondary | ICD-10-CM | POA: Diagnosis not present

## 2022-09-06 DIAGNOSIS — N3941 Urge incontinence: Secondary | ICD-10-CM

## 2022-09-06 LAB — POCT URINALYSIS DIPSTICK
Bilirubin, UA: NEGATIVE
Blood, UA: NEGATIVE
Glucose, UA: NEGATIVE
Nitrite, UA: NEGATIVE
Protein, UA: POSITIVE — AB
Spec Grav, UA: 1.02 (ref 1.010–1.025)
Urobilinogen, UA: 0.2 E.U./dL
pH, UA: 5.5 (ref 5.0–8.0)

## 2022-09-06 MED ORDER — TRIMETHOPRIM 100 MG PO TABS: 100.0000 mg | ORAL_TABLET | Freq: Every day | ORAL | 0 refills | Status: DC

## 2022-09-06 NOTE — Progress Notes (Signed)
09/06/2022 2:01 PM   Summer [name] 05-07-1961 [id number]  Referring provider: Claire Shown, NP [place],  [place] [address]  No chief complaint on file.   HPI:  F/u -   1) gross hematuria-patient developed gross hematuria with red urine May 2023. She tried an abx. She underwent CT scan of the abdomen and pelvis May 2023 which was benign.  There was a 5.7 cm right renal cyst. She had back pain that continued. UA clear. She quit smoking 18 yrs ago. She has occasional urgency and leakage of urine. She had blood in her stool and c-scope showed 11 polyps recently.   June 2023 cystoscopy and exam was benign.   She may have had some vaginal bleeding.  She had a pelvic ultrasound ordered and GYN follow-up.   Today, seen for the above. She returns early. She had urgency and frequency. She had dysuria. She had red urine and blood on a pad (she thinks this was urine that she leaked). She took abx. It resolved. No constipation.  Oct 2023 abd US revealed normal kidneys with the right renal cyst. She will see endocrine soon.   UA today clear.    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      PMH: Past Medical History:  Diagnosis Date   Arthritis    Bell's palsy    Dysrhythmia, cardiac    Fibromyalgia    Hashimoto's disease    History of CT scan 06/2017   "coronary CT scan on 06/01/2017, this did not find any cardiac calcifications. Coronary calcium score of zero."   Hypertension    Thyroid disease    Urticaria     Surgical History: Past Surgical History:  Procedure Laterality Date   BIOPSY  11/24/2021   Procedure: BIOPSY;  Surgeon: Harvel Quale, MD;  [facility];  Service: Gastroenterology;;   COLONOSCOPY N/A 01/23/2014   Procedure: COLONOSCOPY;  Surgeon: Rogene Houston, MD;  [facility];  Service: Endoscopy;  Laterality: N/A;  200   COLONOSCOPY WITH PROPOFOL N/A 11/24/2021   Procedure: COLONOSCOPY  WITH PROPOFOL;  Surgeon: Harvel Quale, MD;  [facility];  Service: Gastroenterology;  Laterality: N/A;  205   ESOPHAGOGASTRODUODENOSCOPY (EGD) WITH PROPOFOL N/A 11/24/2021   Procedure: ESOPHAGOGASTRODUODENOSCOPY (EGD) WITH PROPOFOL;  Surgeon: Harvel Quale, MD;  [facility];  Service: Gastroenterology;  Laterality: N/A;   POLYPECTOMY  11/24/2021   Procedure: POLYPECTOMY;  Surgeon: Harvel Quale, MD;  [facility];  Service: Gastroenterology;;   TONSILLECTOMY      Home Medications:  Allergies as of 09/06/2022       Reactions   Bee Venom Anaphylaxis, Hives   Molds & Smuts Shortness Of Breath   Shellfish Allergy Anaphylaxis, Hives   Avelox [moxifloxacin Hcl In Nacl] Other (See Comments)   "heart beats weird"   Tape Rash        Medication List        Accurate as of September 06, 2022  2:01 PM. If you have any questions, ask your nurse or doctor.          albuterol (2.5 MG/3ML) 0.083% nebulizer solution Commonly known as: PROVENTIL Take 3 mLs (2.5 mg total) by nebulization every 6 (six) hours as needed for wheezing or shortness of breath.   albuterol 108 (90 Base) MCG/ACT inhaler Commonly known as: Ventolin HFA Inhale 2 puffs into the lungs every 6 (  six) hours as needed for wheezing or shortness of breath.   ALPRAZolam 0.5 MG tablet Commonly known as: XANAX Take 1 tablet by mouth twice daily as needed for anxiety   budesonide-formoterol 80-4.5 MCG/ACT inhaler Commonly known as: Symbicort Inhale 2 puffs twice a day with spacer to help prevent cough and wheeze   cetirizine 10 MG tablet Commonly known as: ZYRTEC Take 1 tablet (10 mg total) by mouth daily as needed for allergies (Can take an extra dose during flare ups.). What changed: when to take this   cromolyn 20 MG/2ML nebulizer solution Commonly known as: INTAL Take 2 mLs (20 mg total) by nebulization in the morning, at noon, in the evening, and at  bedtime.   EPINEPHrine 0.3 mg/0.3 mL Soaj injection Commonly known as: EPI-PEN Inject 0.3 mg into the muscle as needed for anaphylaxis.   famotidine 20 MG tablet Commonly known as: PEPCID Take 1 tablet (20 mg total) by mouth 2 (two) times daily.   furosemide 20 MG tablet Commonly known as: LASIX TAKE 2 TABLETS BY MOUTH IN THE MORNING AS NEEDED   ipratropium 0.03 % nasal spray Commonly known as: ATROVENT Place 1 spray in each nostril up to 3 times a day as needed for runny nose   Magnesium 200 MG Tabs Take 1 tablet by mouth daily.   medroxyPROGESTERone 10 MG tablet Commonly known as: PROVERA 10 days daily each month   nystatin cream Commonly known as: MYCOSTATIN Apply 1 Application topically 2 (two) times daily. What changed:  when to take this reasons to take this   OLIVE LEAF PO Take 1 tablet by mouth daily.   OVER THE COUNTER MEDICATION as needed. Red root   pantoprazole 20 MG tablet Commonly known as: Protonix Take one tablet 30 minutes before breakfast   potassium chloride SA 20 MEQ tablet Commonly known as: KLOR-CON M Take 1 tablet by mouth once daily   predniSONE 20 MG tablet Commonly known as: DELTASONE Take 2 tablets (40 mg total) by mouth daily with breakfast. For the next four days   Probiotic 1-250 BILLION-MG Caps Take 1 capsule by mouth daily.   rosuvastatin 5 MG tablet Commonly known as: Crestor Take 1 tablet (5 mg total) by mouth daily.   SUPER B COMPLEX PO Take by mouth daily at 6 (six) AM.   thyroid 60 MG tablet Commonly known as: NP Thyroid TAKE 1 TABLET BY MOUTH ONCE DAILY BEFORE BREAKFAST   Vitamin C 500 MG Caps Take 1 capsule by mouth daily.   Vitamin D-3 125 MCG (5000 UT) Tabs Take 1 tablet by mouth daily.        Allergies:  Allergies  Allergen Reactions   Bee Venom Anaphylaxis and Hives   Molds & Smuts Shortness Of Breath   Shellfish Allergy Anaphylaxis and Hives   Avelox [Moxifloxacin Hcl In Nacl] Other (See  Comments)    "heart beats weird"   Tape Rash    Family History: Family History  Adopted: Yes  Problem Relation Age of Onset   Cancer Mother        lung   Other Father    Other Daughter        Lyme's disease    Social History:  reports that she quit smoking about 18 years ago. Her smoking use included cigarettes. She has a 20.00 pack-year smoking history. She has never used smokeless tobacco. She reports that she does not drink alcohol and does not use drugs.   Physical Exam: BP Marland Kitchen)  142/83   Pulse 90   LMP 01/23/2014 Comment: spotting  Constitutional:  Alert and oriented, No acute distress. HEENT: [place] AT, moist mucus membranes.  Trachea midline, no masses. Cardiovascular: No clubbing, cyanosis, or edema. Respiratory: Normal respiratory effort, no increased work of breathing. GI: Abdomen is soft, nontender, nondistended, no abdominal masses GU: No CVA tenderness Skin: No rashes, bruises or suspicious lesions. Neurologic: Grossly intact, no focal deficits, moving all 4 extremities. Psychiatric: Normal mood and affect.  Laboratory Data: Lab Results  Component Value Date   WBC 8.0 08/13/2022   HGB 16.9 (H) 08/13/2022   HCT 50.2 (H) 08/13/2022   MCV 91.1 08/13/2022   PLT 261 08/13/2022    Lab Results  Component Value Date   CREATININE 0.64 08/13/2022    No results found for: "PSA"  No results found for: "TESTOSTERONE"  Lab Results  Component Value Date   HGBA1C 6.2 (H) 04/03/2021    Urinalysis    Component Value Date/Time   COLORURINE YELLOW 10/30/2020 2036   APPEARANCEUR Clear 04/05/2022 1519   LABSPEC 1.019 10/30/2020 2036   PHURINE 5.0 10/30/2020 2036   GLUCOSEU Negative 04/05/2022 1519   HGBUR MODERATE (A) 10/30/2020 2036   BILIRUBINUR neg 09/06/2022 1355   BILIRUBINUR Negative 04/05/2022 1519   KETONESUR 80 (A) 10/30/2020 2036   PROTEINUR Positive (A) 09/06/2022 1355   PROTEINUR Trace (A) 04/05/2022 1519   PROTEINUR NEGATIVE 10/30/2020 2036    UROBILINOGEN 0.2 09/06/2022 1355   UROBILINOGEN 0.2 11/09/2013 0446   NITRITE neg 09/06/2022 1355   NITRITE Negative 04/05/2022 1519   NITRITE NEGATIVE 10/30/2020 2036   LEUKOCYTESUR Trace (A) 09/06/2022 1355   LEUKOCYTESUR Negative 04/05/2022 1519   LEUKOCYTESUR NEGATIVE 10/30/2020 2036    Lab Results  Component Value Date   LABMICR Comment 04/05/2022   MUCUS 0 08/06/2013   BACTERIA NONE SEEN 10/30/2020    Pertinent Imaging:  Abd [place] images reviewed with pt   Assessment & Plan:    1. Gross hematuria May be related to cystitis. Will put on a nightly abx and repeat cystoscopy. She asked about IC but that typically would not cause gross hematuria in the absence of severe ulcerations which she did not have in June but we will check again.  - POCT urinalysis dipstick  2. Urinary urgency and frequency - we discussed trial of OAB med. She wants to hold off on that.   No follow-ups on file.  Festus Aloe, MD  [facility]  [address] [place], [place] [address] [phone]

## 2022-09-15 ENCOUNTER — Other Ambulatory Visit: Payer: 59

## 2022-09-16 ENCOUNTER — Ambulatory Visit (INDEPENDENT_AMBULATORY_CARE_PROVIDER_SITE_OTHER): Payer: 59 | Admitting: Gastroenterology

## 2022-09-20 ENCOUNTER — Encounter (INDEPENDENT_AMBULATORY_CARE_PROVIDER_SITE_OTHER): Payer: Self-pay | Admitting: Gastroenterology

## 2022-09-20 ENCOUNTER — Ambulatory Visit (INDEPENDENT_AMBULATORY_CARE_PROVIDER_SITE_OTHER): Payer: 59 | Admitting: Gastroenterology

## 2022-09-20 VITALS — BP 141/91 | HR 88 | Temp 98.4°F | Ht 64.0 in | Wt 309.9 lb

## 2022-09-20 DIAGNOSIS — K760 Fatty (change of) liver, not elsewhere classified: Secondary | ICD-10-CM | POA: Diagnosis not present

## 2022-09-20 DIAGNOSIS — K625 Hemorrhage of anus and rectum: Secondary | ICD-10-CM | POA: Diagnosis not present

## 2022-09-20 DIAGNOSIS — K649 Unspecified hemorrhoids: Secondary | ICD-10-CM | POA: Diagnosis not present

## 2022-09-20 MED ORDER — HYDROCORTISONE (PERIANAL) 2.5 % EX CREA: 1.0000 | TOPICAL_CREAM | Freq: Three times a day (TID) | CUTANEOUS | 1 refills | Status: DC

## 2022-09-20 NOTE — Patient Instructions (Signed)
Please avoid straining and limit toilet time to no more than 5 minutes, you can continue to use anusol cream for hemorrhoids up to three times per day. Please let me know if you have worsening rectal bleeding. Please let me know if you would like to proceed with hemorrhoid banding.  As discussed I would talk with PCP about possible nephrology referral given blood in urine and negative urology workup  I am providing the mediterranean diet, this is a guideline to help you know which foods you should eat and those you should try to limit or avoid. It is important to make sure you are getting atleast 30 minutes of exercise 4-5x/week You should aim for 5-7% decrease in overall weight. Fatty liver is usually well managed with dietary and lifestyle modifications, however, in rare cases, this can progress to fibrosis/cirrhosis of the liver, which is serious.  Please avoid alcohol as this can worsen liver issues.

## 2022-09-20 NOTE — Progress Notes (Addendum)
Referring Provider: Kathyrn Drown, MD Primary Care Physician:  Kathyrn Drown, MD Primary GI Physician: Jenetta Downer   Chief Complaint  Patient presents with   Hemorrhoids    Has seen some blood in stool and issues with hemorrhoids. Happened about one month ago. Using cream for hemorrhoid.    HPI:   Summer [name] is a 61 y.o. female with past medical history of  heart failure (normal EF July 2022), Fibromyalgia, Bell's palsy, hypothyroidism, HTN.    Patient presenting today for hemorrhoids.  Last visit in October, at that time, having continued LUQ pain, bloating and belching. taking a daily probiotic with some improvement. Also having some bilateral lower abdominal pain that comes and goes.  Having 3 BMs per day that are soft but solid. She states that stools continue to be orange, the color of a sweet potato. She notes that she has drank dandelion tea which is supposed to help cleanse the liver and noted more brown colored stools with this. reported heartburn 2-3x/week on pepcid BID. has woke up at night wheezing and is unsure if this is related to her allergies or reflux. Symptoms more prevalent at night.   Recommended pepcid '20mg'$  and '40mg'$  in the evening, simethicone for gas, US abdomen, daily probiotic.  Present:  Patient states that about 1 month ago she began having rectal bleeding, noting blood on the toilet paper, no blood in the toilet or mixed in with stools, this lasted about 1 week and was occurring everytime she had a BM. She had some rectal discomfort at the time of bleeding. She is drinking more water and using hemorrhoid cream she was given a while back TID. She then began having blood in her urine again, this is the third time she has had blood in her urine. Has seen PCP and urology, started on 30 day course of antibiotics though notably UA only showed protein and trace leukocytes . Has has had vaginal bleeding in the past as well, though none more recently. She has seen OB  GYN and had transvaginal US with some thickening of the endometrium, started on progesterone and was recommended to have a procedure done for further evaluation of this which has not been done. She is having 2-3 BMs per day, stools are usually formed but soft, she has occasional diarrhea or harder stools. Stools continue to be orange in color. Most Recent Abd [place] complete showed hepatic steatosis.   Patient denies melena, nausea, vomiting, constipation, dysphagia, odyonophagia, early satiety or weight loss.   Last Colonoscopy:11/24/21- Hemorrhoids found on perianal exam. - Six 3 to 8 mm polyps in the transverse colon, in the ascending colon and in the cecum, - Two 1 to 2 mm polyps in the transverse colon - Medium-sized lipoma in the transverse colon. - Three 3 to 5 mm polyps in the rectum and in the sigmoid colon - The entire examined colon is normal. Biopsied-normal - Internal hemorrhoids.  (total of 9 tubular adenomas, one submucosal lipoma and 1 hyperplastic polyp) Last Endoscopy:11/24/21 2 cm hiatal hernia. - Normal stomach. Biopsied-normal  - Normal examined duodenum. Biopsied-normal- no celiac  Past Medical History:  Diagnosis Date   Arthritis    Bell's palsy    Dysrhythmia, cardiac    Fibromyalgia    Hashimoto's disease    History of CT scan 06/2017   "coronary CT scan on 06/01/2017, this did not find any cardiac calcifications. Coronary calcium score of zero."   Hypertension    Thyroid disease  Urticaria     Past Surgical History:  Procedure Laterality Date   BIOPSY  11/24/2021   Procedure: BIOPSY;  Surgeon: Harvel Quale, MD;  [facility];  Service: Gastroenterology;;   COLONOSCOPY N/A 01/23/2014   Procedure: COLONOSCOPY;  Surgeon: Rogene Houston, MD;  [facility];  Service: Endoscopy;  Laterality: N/A;  200   COLONOSCOPY WITH PROPOFOL N/A 11/24/2021   Procedure: COLONOSCOPY WITH PROPOFOL;  Surgeon: Harvel Quale, MD;   [facility];  Service: Gastroenterology;  Laterality: N/A;  205   ESOPHAGOGASTRODUODENOSCOPY (EGD) WITH PROPOFOL N/A 11/24/2021   Procedure: ESOPHAGOGASTRODUODENOSCOPY (EGD) WITH PROPOFOL;  Surgeon: Harvel Quale, MD;  [facility];  Service: Gastroenterology;  Laterality: N/A;   POLYPECTOMY  11/24/2021   Procedure: POLYPECTOMY;  Surgeon: Montez Morita, Quillian Quince, MD;  [facility];  Service: Gastroenterology;;   TONSILLECTOMY      Current Outpatient Medications  Medication Sig Dispense Refill   albuterol (PROVENTIL) (2.5 MG/3ML) 0.083% nebulizer solution Take 3 mLs (2.5 mg total) by nebulization every 6 (six) hours as needed for wheezing or shortness of breath. 75 mL 1   albuterol (VENTOLIN HFA) 108 (90 Base) MCG/ACT inhaler Inhale 2 puffs into the lungs every 6 (six) hours as needed for wheezing or shortness of breath. 18 g 1   ALPRAZolam (XANAX) 0.5 MG tablet Take 1 tablet by mouth twice daily as needed for anxiety 20 tablet 1   Ascorbic Acid (VITAMIN C) 500 MG CAPS Take 1 capsule by mouth daily.     B Complex-C (SUPER B COMPLEX PO) Take by mouth daily at 6 (six) AM.     Bacillus Coagulans-Inulin (PROBIOTIC) 1-250 BILLION-MG CAPS Take 1 capsule by mouth daily.     budesonide-formoterol (SYMBICORT) 80-4.5 MCG/ACT inhaler Inhale 2 puffs twice a day with spacer to help prevent cough and wheeze 1 each 3   cetirizine (ZYRTEC) 10 MG tablet Take 1 tablet (10 mg total) by mouth daily as needed for allergies (Can take an extra dose during flare ups.). (Patient taking differently: Take 10 mg by mouth 2 (two) times daily.) 60 tablet 5   Cholecalciferol (VITAMIN D-3) 5000 UNITS TABS Take 1 tablet by mouth daily.     cromolyn (INTAL) 20 MG/2ML nebulizer solution Take 2 mLs (20 mg total) by nebulization in the morning, at noon, in the evening, and at bedtime. 120 mL 5   EPINEPHrine 0.3 mg/0.3 mL IJ SOAJ injection Inject 0.3 mg into the muscle as needed for  anaphylaxis. 1 each 1   famotidine (PEPCID) 20 MG tablet Take 1 tablet (20 mg total) by mouth 2 (two) times daily. 60 tablet 5   furosemide (LASIX) 20 MG tablet TAKE 2 TABLETS BY MOUTH IN THE MORNING AS NEEDED 60 tablet 5   ipratropium (ATROVENT) 0.03 % nasal spray Place 1 spray in each nostril up to 3 times a day as needed for runny nose 30 mL 3   Magnesium 200 MG TABS Take 1 tablet by mouth daily.     nystatin cream (MYCOSTATIN) Apply 1 Application topically 2 (two) times daily. (Patient taking differently: Apply 1 Application topically as needed for dry skin.) 30 g 2   pantoprazole (PROTONIX) 20 MG tablet Take one tablet 30 minutes before breakfast 30 tablet 0   potassium chloride SA (KLOR-CON M) 20 MEQ tablet Take 1 tablet by mouth once daily 30 tablet 5   rosuvastatin (CRESTOR) 5 MG tablet Take 1 tablet (5 mg total)  by mouth daily. 30 tablet 5   thyroid (NP THYROID) 60 MG tablet TAKE 1 TABLET BY MOUTH ONCE DAILY BEFORE BREAKFAST 30 tablet 5   trimethoprim (TRIMPEX) 100 MG tablet Take 1 tablet (100 mg total) by mouth daily. 45 tablet 0   medroxyPROGESTERone (PROVERA) 10 MG tablet 10 days daily each month (Patient not taking: Reported on 08/25/2022) 30 tablet 4   No current facility-administered medications for this visit.    Allergies as of 09/20/2022 - Review Complete 09/20/2022  Allergen Reaction Noted   Bee venom Anaphylaxis and Hives 09/06/2012   Molds & smuts Shortness Of Breath 11/20/2021   Shellfish allergy Anaphylaxis and Hives 09/06/2012   Avelox [moxifloxacin hcl in nacl] Other (See Comments) 11/22/2013   Tape Rash 11/20/2021    Family History  Adopted: Yes  Problem Relation Age of Onset   Cancer Mother        lung   Other Father    Other Daughter        Lyme's disease    Social History   Socioeconomic History   Marital status: Married    Spouse name: Not on file   Number of children: Not on file   Years of education: Not on file   Highest education level: Not  on file  Occupational History   Not on file  Tobacco Use   Smoking status: Former    Packs/day: 1.00    Years: 20.00    Total pack years: 20.00    Types: Cigarettes    Quit date: 11/08/2003    Years since quitting: 18.8   Smokeless tobacco: Never   Tobacco comments:    quit smoking 10 yrs   Vaping Use   Vaping Use: Never used  Substance and Sexual Activity   Alcohol use: No   Drug use: No   Sexual activity: Yes    Birth control/protection: Post-menopausal  Other Topics Concern   Not on file  Social History Narrative   Not on file   Social Determinants of Health   Financial Resource Strain: Not on file  Food Insecurity: Not on file  Transportation Needs: Not on file  Physical Activity: Not on file  Stress: Not on file  Social Connections: Not on file    Review of systems General: negative for malaise, night sweats, fever, chills, weight loss Neck: Negative for lumps, goiter, pain and significant neck swelling Resp: Negative for cough, wheezing, dyspnea at rest CV: Negative for chest pain, leg swelling, palpitations, orthopnea GI: denies melena,  nausea, vomiting, diarrhea, constipation, dysphagia, odyonophagia, early satiety or unintentional weight loss. +toilet tissue hematochezia +lower abdominal pain MSK: Negative for joint pain or swelling, back pain, and muscle pain. Derm: Negative for itching or rash Psych: Denies depression, anxiety, memory loss, confusion. No homicidal or suicidal ideation.  Heme: Negative for prolonged bleeding, bruising easily, and swollen nodes. Endocrine: Negative for cold or heat intolerance, polyuria, polydipsia and goiter. Neuro: negative for tremor, gait imbalance, syncope and seizures. The remainder of the review of systems is noncontributory.  Physical Exam: BP (!) 141/91 (BP Location: Left Arm, Patient Position: Sitting, Cuff Size: Large)   Pulse 88   Temp 98.4 F (36.9 C) (Oral)   Ht '5\' 4"'$  (1.626 m)   Wt (!) 309 lb 14.4 oz  (140.6 kg)   LMP 01/23/2014 Comment: spotting  BMI 53.19 kg/m  General:   Alert and oriented. No distress noted. Pleasant and cooperative.  Head:  Normocephalic and atraumatic. Eyes:  Conjuctiva clear  without scleral icterus. Mouth:  Oral mucosa pink and moist. Good dentition. No lesions. Heart: Normal rate and rhythm, s1 and s2 heart sounds present.  Lungs: Clear lung sounds in all lobes. Respirations equal and unlabored. Abdomen:  +BS, soft, and non-distended. Mild TTP of epigastric area and diffuse lower abdomen. No rebound or guarding. No HSM or masses noted. Rectal: patient declined rectal exam today Derm: No palmar erythema or jaundice Msk:  Symmetrical without gross deformities. Normal posture. Extremities:  Without edema. Neurologic:  Alert and  oriented x4 Psych:  Alert and cooperative. Normal mood and affect.  Invalid input(s): "6 MONTHS"   ASSESSMENT: Summer [name] is a 61 y.o. female presenting today for rectal bleeding  Patient notes 1 week of toilet tissue hematochezia about a month ago, denies changes in stools or worsening abdominal pain at the time. She is using anusol cream TID and bleeding has stopped. Most recent hemorrhoid in January with multiple polyps and internal hemorrhoids. Suspect bleeding is secondary to known hemorrhoids, however, patient declined rectal exam today. Discussed importance of maintaining soft stools, avoiding straining and limiting toilet time to no more than 5 minutes. Will send refill of anusol cream to use TID. Also discussed hemorrhoid banding as an option and will provide handout, patient will let me know if she is interested in this.  We discussed again the findings of hepatic steatosis on most recent US imaging as well as implementation of the mediterranean diet and getting atleast 30 minutes of exercise 4-5x/week With goal of 5-7% decrease in overall weight. Patient made aware that while fatty liver is usually well managed with dietary  and lifestyle modifications, in rare cases, this can progress to fibrosis/cirrhosis of the liver, which is serious.   Given ongoing, intermittent hematuria without correlating UA and no specific findings by urology to explain symptoms, I did recommend patient discuss further evaluation by nephrology with her PCP.   The patient was found to have elevated blood pressure when vital signs were checked in the office. The blood pressure was rechecked by the nursing staff and it was found be persistently elevated >140/90 mmHg. I personally advised to the patient to follow up closely with her PCP for hypertension control.   PLAN:  Rx anusol cream TID 2. Mediterranean diet  3. 30 minutes of physical activity 4-5x/week with overall decrease in weight by 5-7% 4. Avoid straining, limit toilet time 5. Consider hemorrhoid banding 6. Discuss nephrology referral with PCP for ongoing hematuria 7. See PCP regarding HTN  All questions were answered, patient verbalized understanding and is in agreement with plan as outlined above.   Follow Up: Has f/u in January 2024  Aviel Davalos L. Alver Sorrow, MSN, APRN, AGNP-C Adult-Gerontology Nurse Practitioner [facility]astroenterology at [facility]  I have reviewed the note and agree with the APP's assessment as described in this progress note  Maylon Peppers, MD Gastroenterology and Hepatology [facility]

## 2022-10-01 ENCOUNTER — Other Ambulatory Visit: Payer: 59

## 2022-10-04 ENCOUNTER — Other Ambulatory Visit: Payer: 59 | Admitting: Urology

## 2022-10-10 DIAGNOSIS — H5213 Myopia, bilateral: Secondary | ICD-10-CM | POA: Diagnosis not present

## 2022-10-11 ENCOUNTER — Ambulatory Visit (INDEPENDENT_AMBULATORY_CARE_PROVIDER_SITE_OTHER): Payer: 59 | Admitting: Gastroenterology

## 2022-10-16 ENCOUNTER — Other Ambulatory Visit: Payer: Self-pay | Admitting: Family Medicine

## 2022-10-19 ENCOUNTER — Ambulatory Visit (INDEPENDENT_AMBULATORY_CARE_PROVIDER_SITE_OTHER): Payer: Medicaid Other | Admitting: Family Medicine

## 2022-10-19 ENCOUNTER — Ambulatory Visit: Payer: 59 | Admitting: Family Medicine

## 2022-10-19 VITALS — HR 74 | Temp 98.4°F | Wt 315.0 lb

## 2022-10-19 DIAGNOSIS — D751 Secondary polycythemia: Secondary | ICD-10-CM | POA: Diagnosis not present

## 2022-10-19 DIAGNOSIS — K824 Cholesterolosis of gallbladder: Secondary | ICD-10-CM

## 2022-10-19 DIAGNOSIS — R635 Abnormal weight gain: Secondary | ICD-10-CM

## 2022-10-19 DIAGNOSIS — R319 Hematuria, unspecified: Secondary | ICD-10-CM

## 2022-10-19 DIAGNOSIS — K760 Fatty (change of) liver, not elsewhere classified: Secondary | ICD-10-CM | POA: Diagnosis not present

## 2022-10-19 DIAGNOSIS — N939 Abnormal uterine and vaginal bleeding, unspecified: Secondary | ICD-10-CM

## 2022-10-19 DIAGNOSIS — R400 Somnolence: Secondary | ICD-10-CM | POA: Diagnosis not present

## 2022-10-19 DIAGNOSIS — R1010 Upper abdominal pain, unspecified: Secondary | ICD-10-CM | POA: Diagnosis not present

## 2022-10-19 NOTE — Progress Notes (Signed)
   Subjective:    Patient ID: Summer [name], female    DOB: 04-Feb-1961, 61 y.o.   MRN: [id number]  HPI Follow up for hematuria and rectal bleeding , patient states no current bleeding - has seen specialists for these concerns  Very nice patient Having difficult situation She relates she will see blood on a pad that she wears in her underwear it seems to have the pattern of what might come from the urine but at the same time she is uncertain if it may be coming from the vagina or even the rectum She has seen gynecology, gastroenterology, urology On last visit with gastroenterology they recommended possibility of nephrology consult  Patient also states she has been gaining weight despite trying to be careful with her eating habits She also relates a lot of snoring and daytime somnolence and fatigue She also relates having spells of bloating and gas Review of Systems     Objective:   Physical Exam  Lungs are clear hearts regular abdomen is soft extremities no edema      Assessment & Plan:  1. Daytime somnolence Recommend sleep studies - Ambulatory referral to Sleep Studies  2. Fatty liver This patient has a gallbladder polyp she has intermittent pain with eating she was told by 1 specialist that if she gets her gallbladder removed she will feel better we need to see if her gallbladder is functioning well or not so therefore go ahead with HIDA - NM Hepato W/Eject Fract  3. Pain of upper abdomen HIDA test, avoid spicy foods, - NM Hepato W/Eject Fract  4. Gallbladder polyp HIDA see discussion above - NM Hepato W/Eject Fract  5. Vagina bleeding She needs follow-up with gynecology in order to discern if her bleeding is coming from the uterus D&C would be a good idea to help make sure that there is not abnormal cells  6. Polycythemia She has polycythemia issues she does not smoke but she is around secondhand smoke to some degree but not a lot in addition to this she is having  bleeding issues see discussion above that she is having a hard time this is from rectum Gynecology is planning to do a [facility] had a normal CT scan with a renal cyst and normal cystoscopy Gastroenterology colonoscopy showed polyps as well as internal hemorrhoids Vaginal - Ambulatory referral to Hematology / Oncology  7. Weight gain Patient has limited financial times which makes it difficult to eat healthy.  Also raw vegetables and salads tend to cause her to have stomach issues  8. Hematuria, unspecified type She has had intermittent times of blood in the urine but no casts it is unlikely that this is due to nephrology issue.  I would recommend holding off on nephrology consult currently  Follow-up 3 months

## 2022-11-03 ENCOUNTER — Encounter: Payer: Self-pay | Admitting: Family Medicine

## 2022-11-03 ENCOUNTER — Ambulatory Visit: Payer: 59 | Admitting: Allergy & Immunology

## 2022-11-03 ENCOUNTER — Ambulatory Visit (INDEPENDENT_AMBULATORY_CARE_PROVIDER_SITE_OTHER): Payer: Medicaid Other | Admitting: Family Medicine

## 2022-11-03 VITALS — BP 134/84 | HR 101 | Temp 98.4°F | Ht 64.0 in | Wt 312.0 lb

## 2022-11-03 DIAGNOSIS — R6889 Other general symptoms and signs: Secondary | ICD-10-CM | POA: Diagnosis not present

## 2022-11-03 DIAGNOSIS — J019 Acute sinusitis, unspecified: Secondary | ICD-10-CM

## 2022-11-03 MED ORDER — AMOXICILLIN 500 MG PO CAPS: ORAL_CAPSULE | ORAL | 0 refills | Status: DC

## 2022-11-03 NOTE — Progress Notes (Signed)
   Subjective:    Patient ID: Summer [name], female    DOB: 12-29-1960, 62 y.o.   MRN: [id number]  HPI Patient states has been having body aches, fatigue, sore throat, low grade fever, cough, congestion x 2 days taking ibuprofen 400 mg Q 6hrs Symptoms been going on over the past couple days   Review of Systems     Objective:   Physical Exam General-in no acute distress Eyes-no discharge Lungs-respiratory rate normal, CTA CV-no murmurs,RRR Extremities skin warm dry no edema Neuro grossly normal Behavior normal, alert        Assessment & Plan:   1. Acute rhinosinusitis Antibiotic prescribed warning signs discussed - COVID-19, Flu A+B and RSV  2. Flu-like symptoms Triple swab taken - COVID-19, Flu A+B and RSV

## 2022-11-04 ENCOUNTER — Ambulatory Visit (HOSPITAL_COMMUNITY): Payer: Medicaid Other

## 2022-11-04 LAB — COVID-19, FLU A+B AND RSV
Influenza A, NAA: NOT DETECTED
Influenza B, NAA: NOT DETECTED
RSV, NAA: NOT DETECTED
SARS-CoV-2, NAA: DETECTED — AB

## 2022-11-05 ENCOUNTER — Other Ambulatory Visit: Payer: Self-pay | Admitting: Family Medicine

## 2022-11-05 ENCOUNTER — Telehealth: Payer: Self-pay | Admitting: *Deleted

## 2022-11-05 MED ORDER — NIRMATRELVIR/RITONAVIR (PAXLOVID)TABLET: 3.0000 | ORAL_TABLET | Freq: Two times a day (BID) | ORAL | 0 refills | Status: AC

## 2022-11-05 NOTE — Telephone Encounter (Signed)
Patient wanting results of Covid test- Patient states she got a text from health department telling her to contact her doctor

## 2022-11-05 NOTE — Telephone Encounter (Signed)
See Result Note: Patient states she is still feeling kinda rough- symptoms started Monday - no fever but gets winded with activity- O2 is staying 94 and 95  Patient states her husband has no symptoms currently

## 2022-11-05 NOTE — Telephone Encounter (Signed)
Patient notified and verbalized understanding. 

## 2022-11-05 NOTE — Telephone Encounter (Signed)
Nurses See result note The patient is still having significant symptoms of illness not feeling well I would recommend Paxlovid if she is feeling dramatically better she will more than likely just get better on her own  (Her husband does have disability.  He is having symptoms he should do a home test for COVID.,  If he needs to be worked in this afternoon let me know and I can help)

## 2022-11-05 NOTE — Telephone Encounter (Signed)
Paxlovid was sent to [facility] Stop Crestor while on medicine 3 capsules twice daily for 5 days This medicine blocks replication of COVID which can help speed up getting well and lessen the risk of further issues that could put a person in the hospital

## 2022-11-08 ENCOUNTER — Encounter: Payer: Self-pay | Admitting: Family Medicine

## 2022-11-08 ENCOUNTER — Telehealth: Payer: Self-pay

## 2022-11-08 ENCOUNTER — Ambulatory Visit (INDEPENDENT_AMBULATORY_CARE_PROVIDER_SITE_OTHER): Payer: Medicaid Other | Admitting: Family Medicine

## 2022-11-08 VITALS — BP 132/86 | Temp 97.0°F | Wt 308.0 lb

## 2022-11-08 DIAGNOSIS — J019 Acute sinusitis, unspecified: Secondary | ICD-10-CM

## 2022-11-08 DIAGNOSIS — U071 COVID-19: Secondary | ICD-10-CM

## 2022-11-08 MED ORDER — AZITHROMYCIN 250 MG PO TABS: ORAL_TABLET | ORAL | 0 refills | Status: AC

## 2022-11-08 NOTE — Progress Notes (Signed)
   Subjective:    Patient ID: Summer [name], female    DOB: 1961-06-08, 62 y.o.   MRN: [id number]  HPI  Patient presents today with respiratory illness Number of days present-since 11/03/22-5 days  Symptoms include- productive cough, rattles at time, stuffy head, weakness, diarrhea (possibly from Paxlovid per pt)  Presence of worrisome signs (severe shortness of breath, lethargy, etc.) - shortness of breath   Recent/current visit to urgent care or ER- none  Recent direct exposure to Covid- none  Any current Covid testing- at our office on 11/03/22    Review of Systems     Objective:   Physical Exam General-in no acute distress Eyes-no discharge Lungs-respiratory rate normal, CTA CV-no murmurs,RRR Extremities skin warm dry no edema Neuro grossly normal Behavior normal, alert        Assessment & Plan:   1. Acute rhinosinusitis Doxycycline twice daily for 7 days follow-up if ongoing trouble warnings discussed  2. COVID-19 virus infection Patient not respiratory distress O2 saturation good continue current medication Paxlovid warning signs discussed

## 2022-11-08 NOTE — Telephone Encounter (Signed)
Patient's husband informed , and appointment has been scheduled this afternoon.

## 2022-11-08 NOTE — Telephone Encounter (Signed)
I would recommend that she be seen this afternoon to recheck making sure she does not develop into pneumonia.  She can be seen on my schedule thank you

## 2022-11-08 NOTE — Telephone Encounter (Signed)
Patient's husband is calling for patient, she has covid and being treated with paxlovid and was also on amoxicillin . She is coughing up a lot phlegm yellow / green in color and some wheezing, also experiencing sob with walking around the house , diarrhea, nausea. Please advise.

## 2022-11-09 ENCOUNTER — Ambulatory Visit: Payer: 59 | Admitting: Internal Medicine

## 2022-11-09 ENCOUNTER — Other Ambulatory Visit: Payer: Self-pay

## 2022-11-09 ENCOUNTER — Telehealth: Payer: Self-pay | Admitting: *Deleted

## 2022-11-09 DIAGNOSIS — B370 Candidal stomatitis: Secondary | ICD-10-CM

## 2022-11-09 MED ORDER — NYSTATIN 100000 UNIT/ML MT SUSP: OROMUCOSAL | 0 refills | Status: DC

## 2022-11-09 NOTE — Telephone Encounter (Signed)
Patient called and stated the pharmacy did not receive the script for nystatin as discussed at office visit yesterday   [facility] in [place]

## 2022-11-09 NOTE — Telephone Encounter (Signed)
Left message for patient to inform prescription has been sent to the pharmacy.

## 2022-11-09 NOTE — Telephone Encounter (Signed)
Nystatin oral solution 1 teaspoon swish and swallow, 4 times daily, 7 days for thrush symptoms

## 2022-11-12 ENCOUNTER — Telehealth: Payer: Self-pay | Admitting: Family Medicine

## 2022-11-12 ENCOUNTER — Other Ambulatory Visit: Payer: Self-pay

## 2022-11-12 MED ORDER — CEFDINIR 300 MG PO CAPS: 300.0000 mg | ORAL_CAPSULE | Freq: Two times a day (BID) | ORAL | 0 refills | Status: AC

## 2022-11-12 NOTE — Telephone Encounter (Signed)
Patient made aware per drs notes.

## 2022-11-12 NOTE — Telephone Encounter (Signed)
Omnicef 300 1 bid for 7 days

## 2022-11-12 NOTE — Telephone Encounter (Signed)
Patient called in states she is being treated for Covid. She states that she is getting better but might need another refill on the antibiotic.  CB# [phone]

## 2022-11-15 ENCOUNTER — Ambulatory Visit (INDEPENDENT_AMBULATORY_CARE_PROVIDER_SITE_OTHER): Payer: 59 | Admitting: Gastroenterology

## 2022-11-18 ENCOUNTER — Ambulatory Visit (HOSPITAL_COMMUNITY): Payer: Medicaid Other

## 2022-11-19 ENCOUNTER — Encounter: Payer: Medicaid Other | Admitting: Hematology

## 2022-11-23 ENCOUNTER — Other Ambulatory Visit: Payer: 59

## 2022-11-24 ENCOUNTER — Encounter: Payer: Self-pay | Admitting: Family Medicine

## 2022-11-24 ENCOUNTER — Ambulatory Visit (HOSPITAL_COMMUNITY)
Admission: RE | Admit: 2022-11-24 | Discharge: 2022-11-24 | Disposition: A | Discharge status: Home / Self Care | Payer: Medicaid Other | Source: Ambulatory Visit | Attending: Family Medicine | Admitting: Family Medicine

## 2022-11-24 ENCOUNTER — Ambulatory Visit (INDEPENDENT_AMBULATORY_CARE_PROVIDER_SITE_OTHER): Payer: Medicaid Other | Admitting: Family Medicine

## 2022-11-24 VITALS — BP 136/90 | HR 102 | Temp 98.4°F | Ht 64.0 in | Wt 312.0 lb

## 2022-11-24 DIAGNOSIS — R0600 Dyspnea, unspecified: Secondary | ICD-10-CM | POA: Diagnosis not present

## 2022-11-24 DIAGNOSIS — R059 Cough, unspecified: Secondary | ICD-10-CM | POA: Diagnosis not present

## 2022-11-24 DIAGNOSIS — R0789 Other chest pain: Secondary | ICD-10-CM | POA: Diagnosis not present

## 2022-11-24 DIAGNOSIS — U099 Post covid-19 condition, unspecified: Secondary | ICD-10-CM

## 2022-11-24 MED ORDER — CLARITHROMYCIN 500 MG PO TABS: 500.0000 mg | ORAL_TABLET | Freq: Two times a day (BID) | ORAL | 0 refills | Status: DC

## 2022-11-24 NOTE — Progress Notes (Signed)
   Subjective:    Patient ID: Summer [name], female    DOB: [birth year], 62 y.o.   MRN: [id number]  HPI  Chest pain , sob, fatigue, weak, slight prod cough since yesterday Covid 3 weeks ago  Review of Systems     Objective:   Physical Exam  Gen-NAD not toxic TMS-normal bilateral T- normal no redness Chest-CTA respiratory rate normal no crackles CV RRR no murmur Skin-warm dry Neuro-grossly normal       Assessment & Plan:  Chest pain, fatigue, slight tachycardia, coughing, shortness of breath all go along with post-COVID illness but at the same time need to cover for the possibility of bacterial component antibiotics prescribed warning signs discussed follow-up if problems X-ray ordered

## 2022-11-29 ENCOUNTER — Other Ambulatory Visit: Payer: 59 | Admitting: Urology

## 2022-11-29 ENCOUNTER — Ambulatory Visit (INDEPENDENT_AMBULATORY_CARE_PROVIDER_SITE_OTHER): Payer: Medicaid Other | Admitting: Family Medicine

## 2022-11-29 ENCOUNTER — Other Ambulatory Visit (HOSPITAL_COMMUNITY)
Admission: RE | Admit: 2022-11-29 | Discharge: 2022-11-29 | Disposition: A | Discharge status: Home / Self Care | Payer: Medicaid Other | Source: Ambulatory Visit | Attending: Family Medicine | Admitting: Family Medicine

## 2022-11-29 ENCOUNTER — Encounter: Payer: Self-pay | Admitting: Family Medicine

## 2022-11-29 VITALS — BP 132/82 | Temp 98.0°F | Wt 310.6 lb

## 2022-11-29 DIAGNOSIS — R000 Tachycardia, unspecified: Secondary | ICD-10-CM | POA: Diagnosis not present

## 2022-11-29 DIAGNOSIS — R002 Palpitations: Secondary | ICD-10-CM | POA: Diagnosis not present

## 2022-11-29 DIAGNOSIS — R0609 Other forms of dyspnea: Secondary | ICD-10-CM | POA: Insufficient documentation

## 2022-11-29 DIAGNOSIS — U099 Post covid-19 condition, unspecified: Secondary | ICD-10-CM

## 2022-11-29 DIAGNOSIS — M791 Myalgia, unspecified site: Secondary | ICD-10-CM

## 2022-11-29 LAB — CBC WITH DIFFERENTIAL/PLATELET
Abs Immature Granulocytes: 0.03 10*3/uL (ref 0.00–0.07)
Basophils Absolute: 0 10*3/uL (ref 0.0–0.1)
Basophils Relative: 1 %
Eosinophils Absolute: 0.2 10*3/uL (ref 0.0–0.5)
Eosinophils Relative: 3 %
HCT: 49.1 % — ABNORMAL HIGH (ref 36.0–46.0)
Hemoglobin: 16.4 g/dL — ABNORMAL HIGH (ref 12.0–15.0)
Immature Granulocytes: 0 %
Lymphocytes Relative: 41 %
Lymphs Abs: 2.9 10*3/uL (ref 0.7–4.0)
MCH: 30.3 pg (ref 26.0–34.0)
MCHC: 33.4 g/dL (ref 30.0–36.0)
MCV: 90.8 fL (ref 80.0–100.0)
Monocytes Absolute: 0.5 10*3/uL (ref 0.1–1.0)
Monocytes Relative: 7 %
Neutro Abs: 3.4 10*3/uL (ref 1.7–7.7)
Neutrophils Relative %: 48 %
Platelets: 252 10*3/uL (ref 150–400)
RBC: 5.41 MIL/uL — ABNORMAL HIGH (ref 3.87–5.11)
RDW: 14.5 % (ref 11.5–15.5)
WBC: 7 10*3/uL (ref 4.0–10.5)
nRBC: 0 % (ref 0.0–0.2)

## 2022-11-29 LAB — C-REACTIVE PROTEIN: CRP: 0.7 mg/dL (ref <1.0)

## 2022-11-29 LAB — SEDIMENTATION RATE: Sed Rate: 2 mm/hr (ref 0–22)

## 2022-11-29 LAB — D-DIMER, QUANTITATIVE: D-Dimer, Quant: 5.68 ug/mL-FEU — ABNORMAL HIGH (ref 0.00–0.50)

## 2022-11-29 MED ORDER — ALPRAZOLAM 0.5 MG PO TABS: 0.5000 mg | ORAL_TABLET | Freq: Two times a day (BID) | ORAL | 1 refills | Status: DC | PRN

## 2022-11-29 NOTE — Progress Notes (Signed)
   Subjective:    Patient ID: Summer [name], female    DOB: 05-15-1961, 62 y.o.   MRN: [id number]  HPI Patient arrives today with dry cough, SOB, chest pain, antibiotic causing heart palpitations. Patient stopped antibiotic on Thursday,  Patient relates he feels fatigued tired and rundown short of breath relates a lot of stabbing pains in her chest at x 5 to 10 minutes of pain other times not EKG today does not show any acute changes patient states ever since [place] she has been having off-and-on chest pains shortness of breath and tachycardia.  She states at times her heart rate gets well above 120 with minimal effort.  She also suffered as with morbid obesity. Review of Systems     Objective:   Physical Exam Gen-NAD not toxic TMS-normal bilateral T- normal no redness Chest-CTA respiratory rate normal no crackles CV RRR no murmur Skin-warm dry Neuro-grossly normal        Assessment & Plan:  1. DOE (dyspnea on exertion) Unfortunately it is hard to know if she has a blood clot or not hopefully she does not she has a tendency to run high D-dimers all the time so more than likely will end up needing to have a CT scan of her chest unfortunately I do not see any way around this if we want to be cautious and thorough - D-dimer, quantitative - EKG 12-Lead  2. Tachycardia She gets tachycardic with activity and movement since being having COVID.  More than likely this is autonomic dysregulation related to COVID - EKG 12-Lead  3. 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 Palpitation EKG does not show acute changes  5. Myalgia Complains of myalgias and discomfort since having COVID checked sed rate - C-reactive protein - Sedimentation Rate  Unfortunately it is very difficult to know if she may have an underlying issue with the heart or with pulmonary embolus without doing  additional testing will discuss case with radiology given her body size I believe CT scan will be the best test

## 2022-11-30 ENCOUNTER — Ambulatory Visit: Payer: Medicaid Other | Admitting: Family Medicine

## 2022-11-30 ENCOUNTER — Other Ambulatory Visit: Payer: Self-pay | Admitting: *Deleted

## 2022-11-30 DIAGNOSIS — R0609 Other forms of dyspnea: Secondary | ICD-10-CM

## 2022-11-30 DIAGNOSIS — U099 Post covid-19 condition, unspecified: Secondary | ICD-10-CM

## 2022-12-01 ENCOUNTER — Ambulatory Visit (HOSPITAL_COMMUNITY): Admission: RE | Admit: 2022-12-01 | Payer: Medicaid Other | Source: Ambulatory Visit

## 2022-12-03 ENCOUNTER — Ambulatory Visit (INDEPENDENT_AMBULATORY_CARE_PROVIDER_SITE_OTHER): Payer: Medicaid Other | Admitting: Allergy & Immunology

## 2022-12-03 ENCOUNTER — Encounter: Payer: Self-pay | Admitting: Allergy & Immunology

## 2022-12-03 ENCOUNTER — Other Ambulatory Visit: Payer: Self-pay

## 2022-12-03 VITALS — BP 138/72 | HR 101 | Temp 97.3°F | Resp 20 | Ht 65.0 in | Wt 307.0 lb

## 2022-12-03 DIAGNOSIS — K219 Gastro-esophageal reflux disease without esophagitis: Secondary | ICD-10-CM | POA: Diagnosis not present

## 2022-12-03 DIAGNOSIS — D8940 Mast cell activation, unspecified: Secondary | ICD-10-CM | POA: Diagnosis not present

## 2022-12-03 DIAGNOSIS — T7840XD Allergy, unspecified, subsequent encounter: Secondary | ICD-10-CM | POA: Diagnosis not present

## 2022-12-03 DIAGNOSIS — L299 Pruritus, unspecified: Secondary | ICD-10-CM | POA: Diagnosis not present

## 2022-12-03 DIAGNOSIS — J4540 Moderate persistent asthma, uncomplicated: Secondary | ICD-10-CM

## 2022-12-03 DIAGNOSIS — J3089 Other allergic rhinitis: Secondary | ICD-10-CM

## 2022-12-03 DIAGNOSIS — E063 Autoimmune thyroiditis: Secondary | ICD-10-CM | POA: Diagnosis not present

## 2022-12-03 MED ORDER — CROMOLYN SODIUM 100 MG/5ML PO CONC: 100.0000 mg | Freq: Four times a day (QID) | ORAL | 5 refills | Status: AC

## 2022-12-03 MED ORDER — FAMOTIDINE 40 MG PO TABS: 40.0000 mg | ORAL_TABLET | Freq: Two times a day (BID) | ORAL | 5 refills | Status: DC

## 2022-12-03 MED ORDER — CETIRIZINE HCL 10 MG PO TABS: 20.0000 mg | ORAL_TABLET | Freq: Two times a day (BID) | ORAL | 5 refills | Status: DC

## 2022-12-03 MED ORDER — HYDROXYZINE HCL 25 MG PO TABS: 50.0000 mg | ORAL_TABLET | Freq: Every day | ORAL | 5 refills | Status: AC

## 2022-12-03 MED ORDER — PREDNISONE 10 MG PO TABS: ORAL_TABLET | ORAL | 0 refills | Status: DC

## 2022-12-03 NOTE — Patient Instructions (Addendum)
1. Perennial allergic rhinitis - indoor molds - Continue with: Zyrtec (cetirizine) '10mg'$  tablet twice daily and Pepcid (famotidine) '40mg'$  twice daily - Hopefully we can get the cromolyn to be cheaper with your new insurance plan.  - You can use an extra dose of the antihistamine, if needed, for breakthrough symptoms.   2. 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 - Start taking: hydroxyzine '25mg'$  to '50mg'$  at night - EpiPen is up to date. - [facility]   3. Shortness of breath - Lung testing looks a bit lower, but this might be related to the COVID19.  - Daily controller medication(s): Symbicort 80/4.518m two puffs twice daily with spacer  - Prior to physical activity: albuterol 2 puffs 10-15 minutes before physical activity. - Rescue medications: albuterol 4 puffs every 4-6 hours as needed and albuterol nebulizer one vial every 4-6 hours as needed - Asthma control goals:  * Full participation in all desired activities (may need albuterol before activity) * Albuterol use two time or less a week on average (not counting use with activity) * Cough interfering with sleep two time or less a month * Oral steroids no more than once a year * No hospitalizations  4. Hashimoto's thyroiditis  - We referred you again to Endocrinology.  - I am going to look through my patients to see which Endocrinologist does the best with Hashimoto's thyroiditis.  - Call usKoreaf you do not hear from them in the next week.   5. Return in about 6 weeks (around 01/14/2023).    Please inform usKoreaf any Emergency Department visits, hospitalizations, or changes in symptoms. Call usKoreaefore going to the ED for breathing or allergy symptoms since we might be able to fit you in for a sick visit. Feel free to contact usKoreanytime  with any questions, problems, or concerns.  It was a pleasure to see you again today!  Websites that have reliable patient information: 1. American Academy of Asthma, Allergy, and Immunology: www.aaaai.org 2. Food Allergy Research and Education (FARE): foodallergy.org 3. Mothers of Asthmatics: http://www.asthmacommunitynetwork.org 4. American College of Allergy, Asthma, and Immunology: www.acaai.org   COVID-19 Vaccine Information can be found at: [email or web address] questions related to vaccine distribution or appointments, please email [email or web address] or call [phone]  We realize that you might be concerned about having an allergic reaction to the COVID19 vaccines. To help with that concern, WE ARE OFFERING THE COVID19 VACCINES IN OUR OFFICE! Ask the front desk for dates!     "Like" usKorean Facebook and Instagram for our latest updates!      A healthy democracy works best when ALNew York Life Insurancearticipate! Make sure you are registered to vote! If you have moved or changed any of your contact information, you will need to get this updated before voting!  In some cases, you MAY be able to register to vote online: [email or web address]

## 2022-12-03 NOTE — Progress Notes (Signed)
endo  FOLLOW UP  Date of Service/Encounter:  12/03/22   Assessment:   Perennial allergic rhinitis (indoor molds)   Allergic reaction - unknown trigger (possible mast cell activation syndrome, although tryptase has never been elevated)   Chronic urticaria - despite daily antihistamines and montelukast   Burning of back with anxiety   Shortness of breath - attempting to get Symbicort or Breztri approved through [facility] and [place] presents for follow-up visit.  She reports continued improvement with the use of antihistamines and her mast cell stabilizer.  She is interested in seeing a mast cell specialist and is very excited when I tell her there is 1 at the [place] [facility] [place].  She is willing to travel to see her.  We are going to send in the cromolyn today.  She does have Medicaid now so hopefully it will be covered better.  She does have some new onset worsening shortness of breath, which I felt might be related to her COVID-19 infection.  She is looking for another endocrinologist for management of her Hashimoto's thyroiditis.  We will send in a new referral to a different practice.  Plan/Recommendations:   1. Perennial allergic rhinitis - indoor molds - Continue with: Zyrtec (cetirizine) 83m tablet twice daily and Pepcid (famotidine) 472mtwice daily - Hopefully we can get the cromolyn to be cheaper with your new insurance plan.  - You can use an extra dose of the antihistamine, if needed, for breakthrough symptoms.   2. 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 - Start taking: hydroxyzine 25mg 44m0mg a34mght - EpiPen is up to date. - We are going to send you to see Dr. Iweala Esperanza Sheets!   [facility] - Lung testing looks a bit lower, but this might be related to the COVID19[place] form  provided for free drug.  - Daily controller medication(s): Symbicort 80/4.5mcg tw89muffs twice daily with spacer and nebulizer cromolyn four times daily  - Prior to physical activity: albuterol 2 puffs 10-15 minutes before physical activity. - Rescue medications: albuterol 4 puffs every 4-6 hours as needed and albuterol nebulizer one vial every 4-6 hours as needed - Asthma control goals:  * Full participation in all desired activities (may need albuterol before activity) * Albuterol use two time or less a week on average (not counting use with activity) * Cough interfering with sleep two time or less a month * Oral steroids no more than once a year * No hospitalizations  4. Hashimoto's thyroiditis  - We referred you again to Endocrinology.  - I am going to look through my patients to see which Endocrinologist does the best with Hashimoto's thyroiditis.  - Call us if yoKoreado not hear from them in the next week.   5. Return in about 6 weeks (around 01/14/2023).    Subjective:   Summer ADAIJAH [name] y.o. 62male presenting today for follow up of  Chief Complaint  Patient presents with   Other    Having some problems with her allergy meds.    Summer TELETHIA STROZIERistory of the following: Patient Active Problem List   Diagnosis Date Noted   Hepatic steatosis 09/20/2022   Hemorrhoids 09/20/2022   Orange stool 08/05/2022   Carpal tunnel syndrome 05/18/2022  Spinal stenosis 05/18/2022   Lower abdominal pain 04/28/2022   Constipation 04/06/2022   DOE (dyspnea on exertion) 01/18/2022   Emphysema lung ([place]) 01/15/2022   Hyperlipidemia 01/15/2022   Elevated hemoglobin ([place]) 01/15/2022   Morbid obesity ([place]) 05/28/2021   LUQ pain 12/17/2020   Rectal bleeding 10/28/2020   Prediabetes 06/10/2014   Hypothyroidism 06/10/2014   Gastroesophageal reflux disease 04/01/2013   Fibromyalgia 03/22/2013    History obtained from: chart review and patient.  Summer [name] is a 62 y.o. female  presenting for a follow up visit.  He was last seen in October 2023.  At that time, we continued with Zyrtec and Pepcid twice daily as well as Singulair 10 mg daily.  We started ipratropium 1 spray per nostril up to 3 times daily.  We talked about adding Xolair to help with immunomodulation.  Her lung testing looks stable.  We started her on Symbicort 80 mcg 2 puffs twice daily as well as albuterol as needed.  We referred her to endocrinology.  Since last visit, she has not done well. She caught COVID19 and received a course of Paxlovid. She then subsequently received 4 rounds of antibiotics for "lung infections". She has been having flares of her mast cell activation syndrome. She is reacting to "everything and nothing". She is having generalized pain and SOB when she moves around and she has had pins and needles. She is only able to be up to 3 hours. She started the quercetin when she got sick with COVID. It has helped a little. She did feel good with the cromolyn.  She would like to get that sent in to see if it is covered now that she has Medicaid.  She has been to a number of issues. But she thinks that her organs are inflamed.  She tells me that she thinks this is all related to her mast cell disease.  She has been doing a lot of reading and really does think that this is what she has.  She feels much better when she is on all of the antihistamines, markedly better in fact.   Asthma/Respiratory Symptom History: She remains on the Symbicort 80 mcg 2 puffs twice daily.  She has filled out a CME form, but has not heard anything back about the approval.  She has not been on prednisone.  She has not been using albuterol too much.  Allergic Rhinitis Symptom History: She takes the medicine and it calms everything down. She is on the zyrtec and Pepcid BID; once she had to take an extra dose.  Otherwise, there have been no changes to her past medical history, surgical history, family history, or social  history.    Review of Systems  Constitutional: Negative.  Negative for chills, fever, malaise/fatigue and weight loss.  HENT:  Positive for congestion. Negative for ear discharge, ear pain and sinus pain.   Eyes:  Negative for pain, discharge and redness.  Respiratory:  Positive for cough. Negative for sputum production, shortness of breath and wheezing.   Cardiovascular: Negative.  Negative for chest pain and palpitations.  Gastrointestinal:  Negative for abdominal pain, constipation, diarrhea, heartburn, nausea and vomiting.  Skin:  Positive for itching and rash.  Neurological:  Negative for dizziness and headaches.  Endo/Heme/Allergies:  Positive for environmental allergies. Does not bruise/bleed easily.       Objective:   Blood pressure 138/72, pulse (!) 101, temperature (!) 97.3 F (36.3 C), resp. rate 20, height 5' 5"$  (1.651 m), weight (!) 307  lb (139.3 kg), last menstrual period 01/23/2014, SpO2 97 %. Body mass index is 51.09 kg/m.    Physical Exam Vitals reviewed.  Constitutional:      Appearance: She is well-developed and overweight.     Comments: Very anxious.  HENT:     Head: Normocephalic and atraumatic.     Right Ear: Tympanic membrane, ear canal and external ear normal. No drainage, swelling or tenderness. Tympanic membrane is not injected, scarred, erythematous, retracted or bulging.     Left Ear: Tympanic membrane, ear canal and external ear normal. No drainage, swelling or tenderness. Tympanic membrane is not injected, scarred, erythematous, retracted or bulging.     Nose: No nasal deformity, septal deviation, mucosal edema or rhinorrhea.     Right Turbinates: Enlarged, swollen and pale.     Left Turbinates: Enlarged, swollen and pale.     Right Sinus: No maxillary sinus tenderness or frontal sinus tenderness.     Left Sinus: No maxillary sinus tenderness or frontal sinus tenderness.     Comments: No polyps noted.     Mouth/Throat:     Mouth: Mucous  membranes are not pale and not dry.     Pharynx: Uvula midline.  Eyes:     General:        Right eye: No discharge.        Left eye: No discharge.     Conjunctiva/sclera: Conjunctivae normal.     Right eye: Right conjunctiva is not injected. No chemosis.    Left eye: Left conjunctiva is not injected. No chemosis.    Pupils: Pupils are equal, round, and reactive to light.  Cardiovascular:     Rate and Rhythm: Normal rate and regular rhythm.     Heart sounds: Normal heart sounds.  Pulmonary:     Effort: Pulmonary effort is normal. No tachypnea, accessory muscle usage or respiratory distress.     Breath sounds: Normal breath sounds. Decreased air movement present. No wheezing, rhonchi or rales.     Comments: Decreased air movement at the bases.  No wheezing or crackles. Chest:     Chest wall: No tenderness.  Abdominal:     Tenderness: There is no abdominal tenderness. There is no guarding or rebound.  Lymphadenopathy:     Head:     Right side of head: No submandibular, tonsillar or occipital adenopathy.     Left side of head: No submandibular, tonsillar or occipital adenopathy.     Cervical: No cervical adenopathy.  Skin:    General: Skin is warm.     Capillary Refill: Capillary refill takes less than 2 seconds.     Coloration: Skin is not pale.     Findings: No abrasion, erythema, petechiae or rash. Rash is not papular, urticarial or vesicular.     Comments: Skin somewhat dermatographic.   Neurological:     Mental Status: She is alert.  Psychiatric:        Behavior: Behavior is cooperative.     Diagnostic studies:    Spirometry: results normal (FEV1: 1.66/64%, FVC: 1.99/60%, FEV1/FVC: 83%).    Spirometry consistent with possible restrictive disease.   Allergy Studies:  none  Review of the labs we have done: Alpha gal panel was negative, environmental allergy panel as well as extended mold panel was negative, thyroid antibodies were very positive (consistent with known  history of Hashimoto's), tryptase was normal, inflammatory markers were normal, ANA was negative, chronic urticaria panel was normal, metabolic panel showed elevated glucose as well as  an elevated BUN to creatinine ratio, and a complete blood count was stable with a slightly elevated hemoglobin and hematocrit.         Salvatore Marvel, MD  [facility] [place] [facility]

## 2022-12-07 ENCOUNTER — Ambulatory Visit (INDEPENDENT_AMBULATORY_CARE_PROVIDER_SITE_OTHER): Payer: Medicaid Other | Admitting: Family Medicine

## 2022-12-07 VITALS — BP 122/80 | Wt 307.0 lb

## 2022-12-07 DIAGNOSIS — D582 Other hemoglobinopathies: Secondary | ICD-10-CM | POA: Diagnosis not present

## 2022-12-07 DIAGNOSIS — I878 Other specified disorders of veins: Secondary | ICD-10-CM

## 2022-12-07 DIAGNOSIS — U099 Post covid-19 condition, unspecified: Secondary | ICD-10-CM | POA: Diagnosis not present

## 2022-12-07 DIAGNOSIS — J432 Centrilobular emphysema: Secondary | ICD-10-CM | POA: Diagnosis not present

## 2022-12-07 DIAGNOSIS — R0609 Other forms of dyspnea: Secondary | ICD-10-CM | POA: Diagnosis not present

## 2022-12-07 DIAGNOSIS — R7989 Other specified abnormal findings of blood chemistry: Secondary | ICD-10-CM

## 2022-12-07 DIAGNOSIS — R000 Tachycardia, unspecified: Secondary | ICD-10-CM | POA: Diagnosis not present

## 2022-12-07 NOTE — Progress Notes (Signed)
   Subjective:    Patient ID: Summer [name], female    DOB: 1961-07-01, 62 y.o.   MRN: [id number]  HPI Patient arrives today for 8 week follow up post Covid. Patient states her HR jumps up to 103 to 107 with or with out activity.  Patient at times states her heart rate jumps up when she does minimal activity other times it jumps up when she is just sitting still She denies chest pressure tightness or soreness but she does relate intermittent chest pains she also states she has been getting significantly out of breath with activity ever since having COVID and she also relates severe fatigue and tiredness in her muscles as well as arms and legs  She did see the allergist and recent note was reviewed She is also concerned that her toes tend to turn purplish color at times and she was not sure if she had some sort of circulation issue.  Review of Systems     Objective:   Physical Exam Morbidly obese Heart rate around 100 No murmurs Has some venous stasis in the feet with purplish in the toes but good pulses No sores in the feet Minimal edema in the lower legs Patient walked 2 laps at our office which was approximately 200 feet heart rate went up to 105-110 in addition to this       Assessment & Plan:  1. Post covid-19 condition, unspecified Has severe fatigue tiredness autonomic dysregulation dyspnea with minimal activity ever since having COVID In this situation will do test to make sure there is not something more serious going on that could be post-COVID issues such as blood clots or cardiomyopathy therefore do echo and do CT angio  2. Tachycardia Patient would benefit from a 2-week monitor to look for any tachycardia spells she relates at home when she tries to move about doing things she feels like her heart runs away from her  3. DOE (dyspnea on exertion) See discussion above - ECHOCARDIOGRAM COMPLETE - CT Angio Chest Pulmonary Embolism (PE) W or WO Contrast  4. Elevated  d-dimer Move forward with CT angio - CT Angio Chest Pulmonary Embolism (PE) W or WO Contrast Await results of test Patient unable to work with this current situation currently disabled  Foot exam was completed More than likely venous stasis changes but no sign of any ulcers  Morbid obesity patient is trying to watch portion control very difficult for her to stay active because of recent dizziness and post-COVID syndrome  Elevated hemoglobin being followed by hematology  Emphysema noted on lung exam from CAT scan patient recently saw asthma allergist using inhalers currently O2 saturation 97% is not hypoxic

## 2022-12-10 ENCOUNTER — Telehealth: Payer: Self-pay | Admitting: Internal Medicine

## 2022-12-10 ENCOUNTER — Encounter: Payer: Self-pay | Admitting: Family Medicine

## 2022-12-10 ENCOUNTER — Inpatient Hospital Stay: Payer: Medicaid Other | Admitting: Hematology

## 2022-12-10 ENCOUNTER — Ambulatory Visit (INDEPENDENT_AMBULATORY_CARE_PROVIDER_SITE_OTHER): Payer: Medicaid Other | Admitting: Family Medicine

## 2022-12-10 VITALS — BP 122/84 | Wt 307.0 lb

## 2022-12-10 DIAGNOSIS — I878 Other specified disorders of veins: Secondary | ICD-10-CM | POA: Diagnosis not present

## 2022-12-10 NOTE — Progress Notes (Shared)
[facility] [address] [place] [address]   Clinic Day:  12/10/2022  Referring physician: Kathyrn Drown, MD  Patient Care Team: Kathyrn Drown, MD as PCP - General (Family Medicine) Herminio Commons, MD (Inactive) as PCP - Cardiology (Cardiology)   ASSESSMENT & PLAN:   Assessment: ***  Plan: ***  No orders of the defined types were placed in this encounter.     I,[name],acting as a Education administrator for [employer], MD.,have documented all relevant documentation on the behalf of [name], MD,as directed by  [name], MD while in the presence of [name], MD.   ***  [name]   2/9/20247:46 AM  CHIEF COMPLAINT/PURPOSE OF CONSULT:   Diagnosis: Polycythemia   Current Therapy:  ***  HISTORY OF PRESENT ILLNESS:   Summer [name] is a 62 y.o. female presenting to clinic today for evaluation of Polycythemia at the request of her PCP Dr. Sallee Lange. Patient has a Hx of rectal bleeding and hematuria.  Today, she states that she is doing well overall. Her appetite level is at ***%. Her energy level is at ***%.  PAST MEDICAL HISTORY:   Past Medical History: Past Medical History:  Diagnosis Date   Arthritis    Bell's palsy    Dysrhythmia, cardiac    Fibromyalgia    Hashimoto's disease    History of CT scan 06/2017   "coronary CT scan on 06/01/2017, this did not find any cardiac calcifications. Coronary calcium score of zero."   Hypertension    Thyroid disease    Urticaria     Surgical History: Past Surgical History:  Procedure Laterality Date   BIOPSY  11/24/2021   Procedure: BIOPSY;  Surgeon: Harvel Quale, MD;  [facility]: [facility];  Service: Gastroenterology;;   COLONOSCOPY N/A 01/23/2014   Procedure: COLONOSCOPY;  Surgeon: Rogene Houston, MD;  Location: [facility];  Service: Endoscopy;  Laterality: N/A;  200   COLONOSCOPY WITH PROPOFOL N/A 11/24/2021   Procedure:  COLONOSCOPY WITH PROPOFOL;  Surgeon: Harvel Quale, MD;  Location: [facility];  Service: Gastroenterology;  Laterality: N/A;  205   ESOPHAGOGASTRODUODENOSCOPY (EGD) WITH PROPOFOL N/A 11/24/2021   Procedure: ESOPHAGOGASTRODUODENOSCOPY (EGD) WITH PROPOFOL;  Surgeon: Harvel Quale, MD;  Location: [facility];  Service: Gastroenterology;  Laterality: N/A;   POLYPECTOMY  11/24/2021   Procedure: POLYPECTOMY;  Surgeon: Montez Morita, Quillian Quince, MD;  Location: [facility];  Service: Gastroenterology;;   TONSILLECTOMY      Social History: Social History   Socioeconomic History   Marital status: Married    Spouse name: Not on file   Number of children: Not on file   Years of education: Not on file   Highest education level: Not on file  Occupational History   Not on file  Tobacco Use   Smoking status: Former    Packs/day: 1.00    Years: 20.00    Total pack years: 20.00    Types: Cigarettes    Quit date: 11/08/2003    Years since quitting: 19.1   Smokeless tobacco: Never   Tobacco comments:    quit smoking 10 yrs   Vaping Use   Vaping Use: Never used  Substance and Sexual Activity   Alcohol use: No   Drug use: No   Sexual activity: Yes    Birth control/protection: Post-menopausal  Other Topics Concern   Not on file  Social History Narrative   Not on file  Social Determinants of Health   Financial Resource Strain: Not on file  Food Insecurity: Not on file  Transportation Needs: Not on file  Physical Activity: Not on file  Stress: Not on file  Social Connections: Not on file  Intimate Partner Violence: Not on file    Family History: Family History  Adopted: Yes  Problem Relation Age of Onset   Cancer Mother        lung   Other Father    Other Daughter        Lyme's disease    Current Medications:  Current Outpatient Medications:    albuterol (PROVENTIL) (2.5 MG/3ML) 0.083% nebulizer solution, Take 3 mLs (2.5 mg total) by nebulization  every 6 (six) hours as needed for wheezing or shortness of breath., Disp: 75 mL, Rfl: 1   albuterol (VENTOLIN HFA) 108 (90 Base) MCG/ACT inhaler, Inhale 2 puffs into the lungs every 6 (six) hours as needed for wheezing or shortness of breath., Disp: 18 g, Rfl: 1   ALPRAZolam (XANAX) 0.5 MG tablet, Take 1 tablet (0.5 mg total) by mouth 2 (two) times daily as needed. for anxiety, Disp: 20 tablet, Rfl: 1   Ascorbic Acid (VITAMIN C) 500 MG CAPS, Take 1 capsule by mouth daily., Disp: , Rfl:    B Complex-C (SUPER B COMPLEX PO), Take by mouth daily at 6 (six) AM., Disp: , Rfl:    Bacillus Coagulans-Inulin (PROBIOTIC) 1-250 BILLION-MG CAPS, Take 1 capsule by mouth daily., Disp: , Rfl:    budesonide-formoterol (SYMBICORT) 80-4.5 MCG/ACT inhaler, Inhale 2 puffs twice a day with spacer to help prevent cough and wheeze, Disp: 1 each, Rfl: 3   cetirizine (ZYRTEC) 10 MG tablet, Take 2 tablets (20 mg total) by mouth 2 (two) times daily., Disp: 120 tablet, Rfl: 5   Cholecalciferol (VITAMIN D-3) 5000 UNITS TABS, Take 1 tablet by mouth daily., Disp: , Rfl:    cromolyn (GASTROCROM) 100 MG/5ML solution, Take 5 mLs (100 mg total) by mouth in the morning, at noon, in the evening, and at bedtime., Disp: 600 mL, Rfl: 5   cromolyn (INTAL) 20 MG/2ML nebulizer solution, Take 2 mLs (20 mg total) by nebulization in the morning, at noon, in the evening, and at bedtime., Disp: 120 mL, Rfl: 5   EPINEPHrine 0.3 mg/0.3 mL IJ SOAJ injection, Inject 0.3 mg into the muscle as needed for anaphylaxis., Disp: 1 each, Rfl: 1   famotidine (PEPCID) 40 MG tablet, Take 1 tablet (40 mg total) by mouth 2 (two) times daily., Disp: 60 tablet, Rfl: 5   furosemide (LASIX) 20 MG tablet, TAKE 2 TABLETS BY MOUTH IN THE MORNING AS NEEDED, Disp: 60 tablet, Rfl: 0   hydrocortisone (ANUSOL-HC) 2.5 % rectal cream, Place 1 Application rectally 3 (three) times daily. (Patient not taking: Reported on 12/03/2022), Disp: 56 g, Rfl: 1   hydrOXYzine (ATARAX) 25 MG  tablet, Take 2 tablets (50 mg total) by mouth at bedtime., Disp: 60 tablet, Rfl: 5   ipratropium (ATROVENT) 0.03 % nasal spray, Place 1 spray in each nostril up to 3 times a day as needed for runny nose, Disp: 30 mL, Rfl: 3   Magnesium 200 MG TABS, Take 1 tablet by mouth daily., Disp: , Rfl:    medroxyPROGESTERone (PROVERA) 10 MG tablet, 10 days daily each month (Patient not taking: Reported on 12/03/2022), Disp: 30 tablet, Rfl: 4   NON FORMULARY, Take 500 mg by mouth Daily., Disp: , Rfl:    nystatin (MYCOSTATIN) 100000 UNIT/ML suspension, 1 teaspoon  swish and swallow, 4 times daily, 7 days for thrush symptoms (Patient not taking: Reported on 12/03/2022), Disp: 60 mL, Rfl: 0   nystatin cream (MYCOSTATIN), Apply 1 Application topically 2 (two) times daily. (Patient not taking: Reported on 12/03/2022), Disp: 30 g, Rfl: 2   Omega-3 Fatty Acids (FISH OIL) 1200 MG CAPS, Take by mouth., Disp: , Rfl:    pantoprazole (PROTONIX) 20 MG tablet, Take one tablet 30 minutes before breakfast (Patient not taking: Reported on 12/07/2022), Disp: 30 tablet, Rfl: 0   potassium chloride SA (KLOR-CON M) 20 MEQ tablet, Take 1 tablet by mouth once daily, Disp: 30 tablet, Rfl: 0   predniSONE (DELTASONE) 10 MG tablet, Take two tablets (40m) twice daily for three days, then one tablet (127m twice daily for three days, then STOP., Disp: 18 tablet, Rfl: 0   RESTASIS 0.05 % ophthalmic emulsion, 1 drop 2 (two) times daily., Disp: , Rfl:    rosuvastatin (CRESTOR) 5 MG tablet, Take 1 tablet (5 mg total) by mouth daily., Disp: 30 tablet, Rfl: 5   thyroid (NP THYROID) 60 MG tablet, TAKE 1 TABLET BY MOUTH ONCE DAILY BEFORE BREAKFAST, Disp: 30 tablet, Rfl: 5   trimethoprim (TRIMPEX) 100 MG tablet, Take 1 tablet (100 mg total) by mouth daily. (Patient not taking: Reported on 12/03/2022), Disp: 45 tablet, Rfl: 0   Allergies: Allergies  Allergen Reactions   Bee Venom Anaphylaxis and Hives   Molds & Smuts Shortness Of Breath   Shellfish  Allergy Anaphylaxis and Hives   Avelox [Moxifloxacin Hcl In Nacl] Other (See Comments)    "heart beats weird"   Biaxin [Clarithromycin]     Nausea and heart palpitations   Tape Rash    REVIEW OF SYSTEMS:   Review of Systems - Oncology   VITALS:   Last menstrual period 01/23/2014.  Wt Readings from Last 3 Encounters:  12/07/22 (!) 139.3 kg (307 lb)  12/03/22 (!) 139.3 kg (307 lb)  11/29/22 (!) 140.9 kg (310 lb 9.6 oz)    There is no height or weight on file to calculate BMI.  Performance status (ECOG): {CHL ONC [id number] EXAM:   Physical Exam  LABS:      Latest Ref Rng & Units 11/29/2022    5:24 PM 08/13/2022   11:48 AM 03/23/2022    5:13 PM  CBC  WBC 4.0 - 10.5 K/uL 7.0  8.0  8.4   Hemoglobin 12.0 - 15.0 g/dL 16.4  16.9  16.5   Hematocrit 36.0 - 46.0 % 49.1  50.2  49.0   Platelets 150 - 400 K/uL 252  261  294       Latest Ref Rng & Units 08/13/2022   11:48 AM 03/23/2022    5:13 PM 12/23/2021    4:12 PM  CMP  Glucose 70 - 99 mg/dL 126  126  124   BUN 6 - 20 mg/dL 15  15  20   $ Creatinine 0.44 - 1.00 mg/dL 0.64  0.72  0.60   Sodium 135 - 145 mmol/L 141  137  146   Potassium 3.5 - 5.1 mmol/L 3.8  3.8  4.2   Chloride 98 - 111 mmol/L 105  103  107   CO2 22 - 32 mmol/L 28  26  23   $ Calcium 8.9 - 10.3 mg/dL 9.1  9.0  9.7   Total Protein 6.5 - 8.1 g/dL  7.3  6.5   Total Bilirubin 0.3 - 1.2 mg/dL  1.0  0.4  Alkaline Phos 38 - 126 U/L  83  119   AST 15 - 41 U/L  29  21   ALT 0 - 44 U/L  23  17      No results found for: "CEA1", "CEA" / No results found for: "CEA1", "CEA" No results found for: "PSA1" No results found for: "[id number]" No results found for: "CAN125"  No results found for: "TOTALPROTELP", "ALBUMINELP", "A1GS", "A2GS", "BETS", "BETA2SER", "GAMS", "MSPIKE", "SPEI" No results found for: "TIBC", "FERRITIN", "IRONPCTSAT" No results found for: "LDH"   STUDIES:   DG Chest 2 View  Result Date: 11/25/2022 CLINICAL DATA:  Cough and dyspnea  for 3 days. EXAM: CHEST - 2 VIEW COMPARISON:  August 13, 2022 FINDINGS: The heart size and mediastinal contours are within normal limits. Both lungs are clear. Curvature of thoracic spine with degenerative joint changes. IMPRESSION: No active cardiopulmonary disease. Electronically Signed   By: Abelardo Diesel M.D.   On: 11/25/2022 09:01

## 2022-12-10 NOTE — Telephone Encounter (Signed)
Patient would like to switch drs. Please advise

## 2022-12-10 NOTE — Progress Notes (Unsigned)
   Subjective:    Patient ID: Summer [name], female    DOB: 1960-12-08, 62 y.o.   MRN: [id number]  HPI Pt arrives due to sores on feet. Feet turning purple at times. Right top of foot has yellowish looking blisters per husband. Left foot on bottom of toe has sore. At times feet go numb; also having pin/needles feeling  Right hand stays warm and left hand is always cold.  Patient is being worked up for post COVID symptoms She is worried that she is going to end up with amputations in her feet She does suffer with obesity which could contribute to her issues regarding venous stasis The symptoms do deserve thorough working up   Review of Systems     Objective:   Physical Exam General-in no acute distress Eyes-no discharge Lungs-respiratory rate normal, CTA CV-no murmurs,RRR Extremities skin warm dry no edema Neuro grossly normal Behavior normal, alert Purpleish toes pulses are felt in the feet I do not find evidence      Assessment & Plan:  Need to go ahead with ABI to rule out possibility of arterial circulation issues in the feet Patient has CT scan scheduled of the chest because of elevated D-dimer but unfortunately the scan is not scheduled until the end of March we will get this moved into February We will do a follow-up visit first part of February Keep feet propped up would possible Wear heavy socks when possible  Possible Raynauds phenomena in the left hand but does not meet clinical criteria currently

## 2022-12-12 NOTE — Progress Notes (Unsigned)
   Pt did not show up for appt

## 2022-12-13 ENCOUNTER — Ambulatory Visit: Payer: Medicaid Other | Attending: Internal Medicine | Admitting: Internal Medicine

## 2022-12-13 NOTE — Telephone Encounter (Signed)
Hi Summer [name] Due to her transportation issues this truly needs to get done here at [facility].  Given that we are trying to rule out the possibility of a pulmonary embolus I would hope they would be willing to work her in Please call radiology and discussed this with them to see if they be willing to try to help her out somewhere during February thank you-Dr. Sallee Lange

## 2022-12-14 ENCOUNTER — Encounter: Payer: Self-pay | Admitting: Internal Medicine

## 2022-12-20 ENCOUNTER — Telehealth: Payer: Self-pay

## 2022-12-20 ENCOUNTER — Ambulatory Visit: Payer: Medicaid Other

## 2022-12-20 ENCOUNTER — Ambulatory Visit (HOSPITAL_COMMUNITY): Payer: Medicaid Other

## 2022-12-20 NOTE — Telephone Encounter (Signed)
Patient called advising she has having hematuria and wanted to check for an infection. Patient advised she would like to drop of a urine sample today. Appt scheduled for this afternoon.

## 2022-12-20 NOTE — Telephone Encounter (Signed)
I see patient is no longer on schedule. Did patient seek treatment from another provider?

## 2022-12-21 ENCOUNTER — Ambulatory Visit (INDEPENDENT_AMBULATORY_CARE_PROVIDER_SITE_OTHER): Payer: Medicaid Other | Admitting: Urology

## 2022-12-21 ENCOUNTER — Ambulatory Visit (INDEPENDENT_AMBULATORY_CARE_PROVIDER_SITE_OTHER): Payer: Medicaid Other | Admitting: Gastroenterology

## 2022-12-21 DIAGNOSIS — R310 Gross hematuria: Secondary | ICD-10-CM | POA: Diagnosis not present

## 2022-12-21 LAB — MICROSCOPIC EXAMINATION
Epithelial Cells (non renal): >10 /hpf — AB (ref 0–10)
RBC, Urine: >30 /hpf — AB (ref 0–2)

## 2022-12-21 LAB — URINALYSIS, ROUTINE W REFLEX MICROSCOPIC
Bilirubin, UA: NEGATIVE
Glucose, UA: NEGATIVE
Ketones, UA: NEGATIVE
Leukocytes,UA: NEGATIVE
Nitrite, UA: NEGATIVE
Protein,UA: NEGATIVE
Specific Gravity, UA: 1.02 (ref 1.005–1.030)
Urobilinogen, Ur: 0.2 mg/dL (ref 0.2–1.0)
pH, UA: 6 (ref 5.0–7.5)

## 2022-12-21 NOTE — Telephone Encounter (Signed)
Patient came to office with a urine specimen drop off. Wanted to let nurse know, no bleeding today.

## 2022-12-21 NOTE — Progress Notes (Signed)
Patient presents for a ua/ culture due to gross hematuria.  Patient states at the time of ua she was no longer having gross hematuria, she denies any other symptoms.  Dr. Diona Fanti reviewed results and will wait for culture results before starting treatment.

## 2022-12-22 ENCOUNTER — Telehealth: Payer: Self-pay | Admitting: Allergy & Immunology

## 2022-12-22 ENCOUNTER — Ambulatory Visit: Payer: 59 | Admitting: Allergy & Immunology

## 2022-12-22 ENCOUNTER — Telehealth: Payer: Self-pay | Admitting: *Deleted

## 2022-12-22 NOTE — Telephone Encounter (Signed)
Pt is requesting a call from a nurse. She states she has heavy bleeding. Pt has an appointment on 12/27/22.

## 2022-12-22 NOTE — Telephone Encounter (Signed)
Henzley has been referred to  Dr. Esperanza Sheets at [facility] [address], [place] [address]   I have faxed the referral and all corresponding notes to their office.  They will reach out to patient to schedule.

## 2022-12-23 ENCOUNTER — Ambulatory Visit: Payer: Medicaid Other | Admitting: Obstetrics & Gynecology

## 2022-12-24 LAB — URINE CULTURE

## 2022-12-25 ENCOUNTER — Encounter (HOSPITAL_COMMUNITY): Payer: Self-pay | Admitting: *Deleted

## 2022-12-25 ENCOUNTER — Emergency Department (HOSPITAL_COMMUNITY): Payer: Medicaid Other

## 2022-12-25 ENCOUNTER — Emergency Department (HOSPITAL_COMMUNITY)
Admission: EM | Admit: 2022-12-25 | Discharge: 2022-12-25 | Disposition: A | Discharge status: Home / Self Care | Payer: Medicaid Other | Attending: Emergency Medicine | Admitting: Emergency Medicine

## 2022-12-25 ENCOUNTER — Other Ambulatory Visit: Payer: Self-pay

## 2022-12-25 DIAGNOSIS — E039 Hypothyroidism, unspecified: Secondary | ICD-10-CM | POA: Diagnosis not present

## 2022-12-25 DIAGNOSIS — N950 Postmenopausal bleeding: Secondary | ICD-10-CM | POA: Insufficient documentation

## 2022-12-25 DIAGNOSIS — I10 Essential (primary) hypertension: Secondary | ICD-10-CM | POA: Diagnosis not present

## 2022-12-25 DIAGNOSIS — J45909 Unspecified asthma, uncomplicated: Secondary | ICD-10-CM | POA: Insufficient documentation

## 2022-12-25 DIAGNOSIS — R319 Hematuria, unspecified: Secondary | ICD-10-CM | POA: Diagnosis not present

## 2022-12-25 DIAGNOSIS — R109 Unspecified abdominal pain: Secondary | ICD-10-CM | POA: Diagnosis not present

## 2022-12-25 LAB — BASIC METABOLIC PANEL
Anion gap: 9 (ref 5–15)
BUN: 17 mg/dL (ref 6–20)
CO2: 24 mmol/L (ref 22–32)
Calcium: 8.9 mg/dL (ref 8.9–10.3)
Chloride: 103 mmol/L (ref 98–111)
Creatinine, Ser: 0.69 mg/dL (ref 0.44–1.00)
GFR, Estimated: >60 mL/min (ref >60)
Glucose, Bld: 116 mg/dL — ABNORMAL HIGH (ref 70–99)
Potassium: 3.5 mmol/L (ref 3.5–5.1)
Sodium: 136 mmol/L (ref 135–145)

## 2022-12-25 LAB — CBC WITH DIFFERENTIAL/PLATELET
Abs Immature Granulocytes: 0.02 10*3/uL (ref 0.00–0.07)
Basophils Absolute: 0.1 10*3/uL (ref 0.0–0.1)
Basophils Relative: 1 %
Eosinophils Absolute: 0.2 10*3/uL (ref 0.0–0.5)
Eosinophils Relative: 3 %
HCT: 50 % — ABNORMAL HIGH (ref 36.0–46.0)
Hemoglobin: 16.5 g/dL — ABNORMAL HIGH (ref 12.0–15.0)
Immature Granulocytes: 0 %
Lymphocytes Relative: 41 %
Lymphs Abs: 3 10*3/uL (ref 0.7–4.0)
MCH: 29.7 pg (ref 26.0–34.0)
MCHC: 33 g/dL (ref 30.0–36.0)
MCV: 89.9 fL (ref 80.0–100.0)
Monocytes Absolute: 0.6 10*3/uL (ref 0.1–1.0)
Monocytes Relative: 9 %
Neutro Abs: 3.4 10*3/uL (ref 1.7–7.7)
Neutrophils Relative %: 46 %
Platelets: 257 10*3/uL (ref 150–400)
RBC: 5.56 MIL/uL — ABNORMAL HIGH (ref 3.87–5.11)
RDW: 14.7 % (ref 11.5–15.5)
WBC: 7.4 10*3/uL (ref 4.0–10.5)
nRBC: 0 % (ref 0.0–0.2)

## 2022-12-25 LAB — WET PREP, GENITAL
Clue Cells Wet Prep HPF POC: NONE SEEN
Sperm: NONE SEEN
Trich, Wet Prep: NONE SEEN
WBC, Wet Prep HPF POC: <10 (ref <10)
Yeast Wet Prep HPF POC: NONE SEEN

## 2022-12-25 LAB — URINALYSIS, ROUTINE W REFLEX MICROSCOPIC
Bacteria, UA: NONE SEEN
Bilirubin Urine: NEGATIVE
Glucose, UA: NEGATIVE mg/dL
Ketones, ur: NEGATIVE mg/dL
Leukocytes,Ua: NEGATIVE
Nitrite: NEGATIVE
Protein, ur: NEGATIVE mg/dL
Specific Gravity, Urine: 1.012 (ref 1.005–1.030)
pH: 6 (ref 5.0–8.0)

## 2022-12-25 MED ORDER — SODIUM CHLORIDE 0.9 % IV BOLUS
1000.0000 mL | Freq: Once | INTRAVENOUS | Status: AC
  Administered 2022-12-25: 1000 mL via INTRAVENOUS

## 2022-12-25 MED ORDER — KETOROLAC TROMETHAMINE 30 MG/ML IJ SOLN
30.0000 mg | Freq: Once | INTRAMUSCULAR | Status: DC
  Filled 2022-12-25: qty 1

## 2022-12-25 NOTE — ED Provider Notes (Signed)
**Summer Summer** Summer Summer   CSN: [id number] Arrival date & time: 12/25/22  1640     History  Chief Complaint  Patient presents with   Vaginal Bleeding    Summer Summer is a 62 y.o. female.  Pt is a 62 yo female with pmhx significant for fibromyalgia, arthritis, htn, bells palsy, hypothyroidism, asthma, and anxiety.  Pt has had some post-menopausal vaginal bleeding in the past.  She has had endometrial biopsies which were nl (per pt).  She did see Dr. Nelda Marseille in July of 2023 and there was a plan for a hysteroscopy and d&C.  However, that was canceled because pt's son came home from the army.  Pt said she never rescheduled.  She was given a rx for progesterone, but she never took it.  She has seen urology and GI as well for possible rectal and bleeding with urination.  Colonoscopy and cystoscopy neg last year.  Pt has an appt with Dr. Nelda Marseille on Monday the 26th.  She started having bleeding again today.  She had some back pain.  She did see urology on the 20th and had a negative urine cx.         Home Medications Prior to Admission medications   Medication Sig Start Date End Date Taking? Authorizing Provider  albuterol (PROVENTIL) (2.5 MG/3ML) 0.083% nebulizer solution Take 3 mLs (2.5 mg total) by nebulization every 6 (six) hours as needed for wheezing or shortness of breath. 05/21/22   Valentina Shaggy, MD  albuterol (VENTOLIN HFA) 108 (90 Base) MCG/ACT inhaler Inhale 2 puffs into the lungs every 6 (six) hours as needed for wheezing or shortness of breath. 05/21/22   Valentina Shaggy, MD  ALPRAZolam Duanne Moron) 0.5 MG tablet Take 1 tablet (0.5 mg total) by mouth 2 (two) times daily as needed. for anxiety 11/29/22   Kathyrn Drown, MD  Ascorbic Acid (VITAMIN C) 500 MG CAPS Take 1 capsule by mouth daily.    [provider]  B Complex-C (SUPER B COMPLEX PO) Take by mouth daily at 6 (six) AM.    [provider]  Bacillus  Coagulans-Inulin (PROBIOTIC) 1-250 BILLION-MG CAPS Take 1 capsule by mouth daily.    [provider]  budesonide-formoterol (SYMBICORT) 80-4.5 MCG/ACT inhaler Inhale 2 puffs twice a day with spacer to help prevent cough and wheeze 08/25/22   Althea Charon, FNP  cetirizine (ZYRTEC) 10 MG tablet Take 2 tablets (20 mg total) by mouth 2 (two) times daily. 12/03/22 01/02/23  Valentina Shaggy, MD  Cholecalciferol (VITAMIN D-3) 5000 UNITS TABS Take 1 tablet by mouth daily.    [provider]  cromolyn (GASTROCROM) 100 MG/5ML solution Take 5 mLs (100 mg total) by mouth in the morning, at noon, in the evening, and at bedtime. 12/03/22 01/02/23  Valentina Shaggy, MD  cromolyn (INTAL) 20 MG/2ML nebulizer solution Take 2 mLs (20 mg total) by nebulization in the morning, at noon, in the evening, and at bedtime. 05/21/22   Valentina Shaggy, MD  EPINEPHrine 0.3 mg/0.3 mL IJ SOAJ injection Inject 0.3 mg into the muscle as needed for anaphylaxis. 05/21/22   Valentina Shaggy, MD  famotidine (PEPCID) 40 MG tablet Take 1 tablet (40 mg total) by mouth 2 (two) times daily. 12/03/22 01/02/23  Valentina Shaggy, MD  furosemide (LASIX) 20 MG tablet TAKE 2 TABLETS BY MOUTH IN THE MORNING AS NEEDED 10/19/22   Kathyrn Drown, MD  hydrocortisone (ANUSOL-HC) 2.5 %  rectal cream Place 1 Application rectally 3 (three) times daily. 09/20/22   Carlan, Deatra Robinson, NP  hydrOXYzine (ATARAX) 25 MG tablet Take 2 tablets (50 mg total) by mouth at bedtime. 12/03/22 01/02/23  Valentina Shaggy, MD  ipratropium (ATROVENT) 0.03 % nasal spray Place 1 spray in each nostril up to 3 times a day as needed for runny nose 08/25/22   Althea Charon, FNP  Magnesium 200 MG TABS Take 1 tablet by mouth daily.    [provider]  medroxyPROGESTERone (PROVERA) 10 MG tablet 10 days daily each month 05/14/22   Janyth Pupa, DO  NON FORMULARY Take 500 mg by mouth Daily.    [provider]  nystatin (MYCOSTATIN)  100000 UNIT/ML suspension 1 teaspoon swish and swallow, 4 times daily, 7 days for thrush symptoms 11/09/22   Kathyrn Drown, MD  nystatin cream (MYCOSTATIN) Apply 1 Application topically 2 (two) times daily. 08/02/22   [employer]meduite, Trenton Gammon, FNP  Omega-3 Fatty Acids (FISH OIL) 1200 MG CAPS Take by mouth.    [provider]  pantoprazole (PROTONIX) 20 MG tablet Take one tablet 30 minutes before breakfast 08/25/22   Althea Charon, FNP  potassium chloride SA (KLOR-CON M) 20 MEQ tablet Take 1 tablet by mouth once daily 10/19/22   Kathyrn Drown, MD  predniSONE (DELTASONE) 10 MG tablet Take two tablets ('20mg'$ ) twice daily for three days, then one tablet ('10mg'$ ) twice daily for three days, then STOP. 12/03/22   Valentina Shaggy, MD  RESTASIS 0.05 % ophthalmic emulsion 1 drop 2 (two) times daily. 10/08/22   [provider]  rosuvastatin (CRESTOR) 5 MG tablet Take 1 tablet (5 mg total) by mouth daily. 03/30/22   Kathyrn Drown, MD  thyroid (NP THYROID) 60 MG tablet TAKE 1 TABLET BY MOUTH ONCE DAILY BEFORE BREAKFAST 03/30/22   Kathyrn Drown, MD  trimethoprim (TRIMPEX) 100 MG tablet Take 1 tablet (100 mg total) by mouth daily. 09/06/22   Festus Aloe, MD      Allergies    Bee venom, Molds & smuts, Shellfish allergy, Avelox [moxifloxacin hcl in nacl], Biaxin [clarithromycin], and Tape    Review of Systems   Review of Systems  Genitourinary:  Positive for vaginal bleeding.  Musculoskeletal:  Positive for back pain.  All other systems reviewed and are negative.   Physical Exam Updated Vital Signs BP (!) 170/87   Pulse 91   Temp 98.2 F (36.8 C) (Oral)   Resp 16   Ht '5\' 5"'$  (1.651 m)   Wt (!) 138.8 kg   LMP 01/23/2014 Comment: spotting  SpO2 95%   BMI 50.92 kg/m  Physical Exam Vitals and nursing Summer reviewed. Exam conducted with a chaperone present.  Constitutional:      Appearance: Normal appearance. She is obese.  HENT:     Head: Normocephalic and atraumatic.      Right Ear: External ear normal.     Left Ear: External ear normal.     Nose: Nose normal.     Mouth/Throat:     Mouth: Mucous membranes are dry.  Eyes:     Extraocular Movements: Extraocular movements intact.     Conjunctiva/sclera: Conjunctivae normal.     Pupils: Pupils are equal, round, and reactive to light.  Cardiovascular:     Rate and Rhythm: Normal rate and regular rhythm.     Pulses: Normal pulses.     Heart sounds: Normal heart sounds.  Pulmonary:     Effort: Pulmonary effort  is normal.     Breath sounds: Normal breath sounds.  Abdominal:     General: Abdomen is flat. Bowel sounds are normal.     Palpations: Abdomen is soft.     Tenderness: There is abdominal tenderness in the suprapubic area.  Genitourinary:    Exam position: Lithotomy position.     Vagina: Bleeding present.  Musculoskeletal:        General: Normal range of motion.     Cervical back: Normal range of motion and neck supple.  Skin:    General: Skin is warm.     Capillary Refill: Capillary refill takes less than 2 seconds.  Neurological:     General: No focal deficit present.     Mental Status: She is alert and oriented to person, place, and time.  Psychiatric:        Mood and Affect: Mood normal.        Behavior: Behavior normal.     ED Results / Procedures / Treatments   Labs (all labs ordered are listed, but only abnormal results are displayed) Labs Reviewed  CBC WITH DIFFERENTIAL/PLATELET - Abnormal; Notable for the following components:      Result Value   RBC 5.56 (*)    Hemoglobin 16.5 (*)    HCT 50.0 (*)    All other components within normal limits  BASIC METABOLIC PANEL - Abnormal; Notable for the following components:   Glucose, Bld 116 (*)    All other components within normal limits  URINALYSIS, ROUTINE W REFLEX MICROSCOPIC - Abnormal; Notable for the following components:   APPearance HAZY (*)    Hgb urine dipstick SMALL (*)    All other components within normal limits  WET  PREP, GENITAL  GC/CHLAMYDIA PROBE AMP ([place]) NOT AT [facility]    EKG None  Radiology CT Renal Stone Study  Result Date: 12/25/2022 CLINICAL DATA:  62 year old female with acute abdominal and pelvic pain with hematuria. EXAM: CT ABDOMEN AND PELVIS WITHOUT CONTRAST TECHNIQUE: Multidetector CT imaging of the abdomen and pelvis was performed following the standard protocol without IV contrast. RADIATION DOSE REDUCTION: This exam was performed according to the departmental dose-optimization program which includes automated exposure control, adjustment of the mA and/or kV according to patient size and/or use of iterative reconstruction technique. COMPARISON:  01/23/2022 CT FINDINGS: Please Summer that parenchymal and vascular abnormalities may be missed as intravenous contrast was not administered. Lower chest: No acute abnormality Hepatobiliary: The liver and gallbladder are unremarkable. There is no evidence of intrahepatic or extrahepatic biliary dilatation. Pancreas: Unremarkable Spleen: Unremarkable Adrenals/Urinary Tract: A RIGHT renal cyst is again noted for which no follow-up imaging is recommended. The kidneys, adrenal glands and bladder are otherwise unremarkable. There is no evidence of hydronephrosis or urinary calculi. Stomach/Bowel: Stomach is within normal limits. Appendix appears normal. No evidence of bowel wall thickening, distention, or inflammatory changes. Vascular/Lymphatic: No significant vascular findings are present. No enlarged abdominal or pelvic lymph nodes. Reproductive: Uterus and bilateral adnexa are unremarkable. Other: No ascites, focal collection or pneumoperitoneum. Musculoskeletal: No acute or suspicious bony abnormalities are noted. IMPRESSION: No evidence of acute abnormality. No evidence of hydronephrosis or urinary calculi. Electronically Signed   By: Margarette Canada M.D.   On: 12/25/2022 19:19    Procedures Procedures    Medications Ordered in ED Medications   ketorolac (TORADOL) 30 MG/ML injection 30 mg (0 mg Intravenous Hold 12/25/22 1851)  sodium chloride 0.9 % bolus 1,000 mL (1,000 mLs Intravenous New Bag/Given 12/25/22  Antoine.Barban)    ED Course/ Medical Decision Making/ A&P                             Medical Decision Making Amount and/or Complexity of Data Reviewed Labs: ordered. Radiology: ordered.  Risk Prescription drug management.   This patient presents to the ED for concern of vaginal bleeding, this involves an extensive number of treatment options, and is a complaint that carries with it a high risk of complications and morbidity.  The differential diagnosis includes post-menopausal bleeding   Co morbidities that complicate the patient evaluation  fibromyalgia, arthritis, htn, bells palsy, hypothyroidism, asthma, and anxiety   Additional history obtained:  Additional history obtained from epic chart review External records from outside source obtained and reviewed including family   Lab Tests:  I Ordered, and personally interpreted labs.  The pertinent results include:  cbc with hgb 16.5; bmp nl, wet prep neg   Imaging Studies ordered:  I ordered imaging studies including ct renal  I independently visualized and interpreted imaging which showed  No evidence of acute abnormality. No evidence of hydronephrosis or  urinary calculi.   I agree with the radiologist interpretation   Cardiac Monitoring:  The patient was maintained on a cardiac monitor.  I personally viewed and interpreted the cardiac monitored which showed an underlying rhythm of: nsr   Medicines ordered and prescription drug management:  I ordered medication including ivfs  for dehydration  Reevaluation of the patient after these medicines showed that the patient improved I have reviewed the patients home medicines and have made adjustments as needed   Test Considered:  ct   Problem List / ED Course:  Post-menopausal bleeding:  pt has follow  up in 2 days.  I offered her a rx for provera.  Pt said she's scared to take it.  I tried to reassure her that estrogen is what causes blood clots.  She wants to wait until she sees Dr. Nelda Marseille.  She is stable for d/c.  Return if worse.    Reevaluation:  After the interventions noted above, I reevaluated the patient and found that they have :improved   Social Determinants of Health:  Lives at home   Dispostion:  After consideration of the diagnostic results and the patients response to treatment, I feel that the patent would benefit from discharge with outpatient f/u.          Final Clinical Impression(s) / ED Diagnoses Final diagnoses:  Post-menopausal bleeding    Rx / DC Orders ED Discharge Orders     None         Isla Pence, MD 12/25/22 2051

## 2022-12-25 NOTE — ED Triage Notes (Addendum)
Here by POV from home with c/o hematuria and vaginal bleeding, also low back pain, abd pain, and nausea. PCP instructed pt to see urology, gave urine sample for GU, has an appt with GYN on Monday. GU instructed pt to come to ED. Describes as bright red with clots. Reports 2 pads in last hr. Sx onset 8d ago. Urine results from 2/20 in epic. Denies dizziness, light headedness, sob, VD, or known fever.

## 2022-12-25 NOTE — ED Notes (Signed)
Pt transported to CT via stretcher.  

## 2022-12-27 ENCOUNTER — Telehealth: Payer: Self-pay

## 2022-12-27 ENCOUNTER — Ambulatory Visit: Payer: Medicaid Other | Admitting: Obstetrics & Gynecology

## 2022-12-27 ENCOUNTER — Ambulatory Visit (INDEPENDENT_AMBULATORY_CARE_PROVIDER_SITE_OTHER): Payer: Medicaid Other | Admitting: Obstetrics & Gynecology

## 2022-12-27 ENCOUNTER — Encounter: Payer: Self-pay | Admitting: Obstetrics & Gynecology

## 2022-12-27 ENCOUNTER — Ambulatory Visit (HOSPITAL_COMMUNITY)
Admission: RE | Admit: 2022-12-27 | Discharge: 2022-12-27 | Disposition: A | Discharge status: Home / Self Care | Payer: Medicaid Other | Source: Ambulatory Visit | Attending: Family Medicine | Admitting: Family Medicine

## 2022-12-27 VITALS — BP 174/89 | HR 84 | Ht 65.0 in | Wt 305.0 lb

## 2022-12-27 DIAGNOSIS — D8940 Mast cell activation, unspecified: Secondary | ICD-10-CM

## 2022-12-27 DIAGNOSIS — N950 Postmenopausal bleeding: Secondary | ICD-10-CM | POA: Diagnosis not present

## 2022-12-27 DIAGNOSIS — I878 Other specified disorders of veins: Secondary | ICD-10-CM | POA: Diagnosis present

## 2022-12-27 DIAGNOSIS — M79604 Pain in right leg: Secondary | ICD-10-CM | POA: Diagnosis not present

## 2022-12-27 DIAGNOSIS — R9389 Abnormal findings on diagnostic imaging of other specified body structures: Secondary | ICD-10-CM | POA: Diagnosis not present

## 2022-12-27 DIAGNOSIS — Z01818 Encounter for other preprocedural examination: Secondary | ICD-10-CM | POA: Diagnosis not present

## 2022-12-27 LAB — GC/CHLAMYDIA PROBE AMP (~~LOC~~) NOT AT ARMC
Chlamydia: NEGATIVE
Comment: NEGATIVE
Comment: NORMAL
Neisseria Gonorrhea: NEGATIVE

## 2022-12-27 MED ORDER — PROGESTERONE MICRONIZED 100 MG PO CAPS: 100.0000 mg | ORAL_CAPSULE | Freq: Every day | ORAL | 4 refills | Status: DC

## 2022-12-27 NOTE — Telephone Encounter (Signed)
-----   Message from Franchot Gallo, MD sent at 12/24/2022  1:47 PM EST ----- Call pt culture neg no abx needed ----- Message ----- From: Sherrilyn Rist, CMA Sent: 12/24/2022   8:01 AM EST To: Franchot Gallo, MD  Please review

## 2022-12-27 NOTE — Telephone Encounter (Signed)
Left Vm for return call

## 2022-12-27 NOTE — Progress Notes (Signed)
GYN VISIT Patient name: Summer [name] MRN [id number]  Date of birth: [birth year] Chief Complaint:   Post Menopausal Bleeding  History of Present Illness:   Summer [name] is a 62 y.o. [id number] PM female being seen today for the following concerns:  PMB:  Last seen in July due to similar concern.  Prior EMB x 3 have all been negative.  Plan was to start progesterone and have D&C.  Pt did not start progesterone due to concern for cardiac risk and she had to postpone surgery due to personal reasons.  Initially, things were find until about 10 days ago.  Started past Friday, noted a small blood clot in the toilet and when she wiped saw some BRB.  Bleeding has been intermittent since then,sometimes it will stop for 24hrs other times it will be heavy bleeding where she will need to change a pad every 2 hours.  Bleeding will start to slow for a while, but then it will be back.  She also notes some mild pelvic pain and some back pain.  Rates her pain 4/10.  Not taking any medication.  Of note, she has recently been diagnosed with mast cell activation disorder  -prior US reviewed- completed 04/2022: 11.6 x 4.6 x 7.5 cm = volume: 225 mL. Posterior wall transmural leiomyoma 4.5 x 2.3 x 4.4 cm. No additional masses. 36m endometrium  Patient's last menstrual period was 01/23/2014.     12/10/2022   11:32 AM 12/07/2022    9:34 AM 10/19/2022    3:52 PM 08/02/2022    9:37 AM 04/01/2021    3:13 PM  Depression screen PHQ 2/9  Decreased Interest '1 1 1 '$ 0 2  Down, Depressed, Hopeless '3 3 1 1 2  '$ PHQ - 2 Score '4 4 2 1 4  '$ Altered sleeping '1 1 1  2  '$ Tired, decreased energy '1 3 3  2  '$ Change in appetite 0 0 0  2  Feeling bad or failure about yourself  '3 3 2  2  '$ Trouble concentrating '3 3 2  2  '$ Moving slowly or fidgety/restless 0 0 0  1  Suicidal thoughts 1 1 0  0  PHQ-9 Score '13 15 10  15  '$ Difficult doing work/chores Not difficult at all  Somewhat difficult  Somewhat difficult     Review of Systems:    Pertinent items are noted in HPI Denies fever/chills, dizziness, headaches, visual disturbances, fatigue, shortness of breath, chest pain.  Pertinent History Reviewed:  Reviewed past medical,surgical, social, obstetrical and family history.  Reviewed problem list, medications and allergies. Physical Assessment:   Vitals:   12/27/22 0903  BP: (!) 174/89  Pulse: 84  Weight: (!) 305 lb (138.3 kg)  Height: '5\' 5"'$  (1.651 m)  Body mass index is 50.75 kg/m.       Physical Examination:   General appearance: alert, well appearing, and in no distress  Psych: mood appropriate, normal affect  Skin: warm & dry   Cardiovascular: normal heart rate noted  Respiratory: normal respiratory effort, no distress  Abdomen: obese, soft, mild LLQ tenderness with deep palpation, no rebound, no guarding  Pelvic: deferred  Extremities: no calf tenderness bilaterally  Chaperone: N/A    Assessment & Plan:  1) Postmenopausal bleeding -in light of prior negative work up again recommended proceeding with surgical intervention at next available -discussed risk/benefit of surgery including but not limited to risk of bleeding, infection and injury due to uterine perforation -reviewed her concerns regarding allergic  reaction to medication- pt to have preop at [facility].  Pt notes her plan to take medication prior to surgery -reviewed risk/benefit of progesterone, pt agreeable to trial   Meds ordered this encounter  Medications   progesterone (PROMETRIUM) 100 MG capsule    Sig: Take 1 capsule (100 mg total) by mouth daily.    Dispense:  90 capsule    Refill:  [place], DO Attending [place], [facility] for [employer], [employer]

## 2022-12-27 NOTE — Telephone Encounter (Signed)
Patient is aware that her culture was negative and no abx needed. Patient voiced understanding

## 2022-12-28 ENCOUNTER — Encounter: Payer: Self-pay | Admitting: Obstetrics & Gynecology

## 2022-12-28 ENCOUNTER — Telehealth: Payer: Self-pay

## 2022-12-28 NOTE — Telephone Encounter (Signed)
Cromolyn Sodium 20 mg/2 mL nebulizer solution PA...  KEY: [id number]  Forwarding message to PA Team to initiate PA.

## 2022-12-30 NOTE — Progress Notes (Shared)
[facility] [address] [place] [address]   Clinic Day:  12/30/2022  Referring physician: Kathyrn Drown, MD  Patient Care Team: Kathyrn Drown, MD as PCP - General (Family Medicine) Herminio Commons, MD (Inactive) as PCP - Cardiology (Cardiology)   ASSESSMENT & PLAN:   Assessment: ***  Plan: ***  No orders of the defined types were placed in this encounter.     Beverly Gust Oliver,acting as a scribe for [name], MD.,have documented all relevant documentation on the behalf of [name], MD,as directed by  [name], MD while in the presence of [name], MD.   ***  Alric Ran Andreas Ohm   2/29/202410:51 AM  CHIEF COMPLAINT/PURPOSE OF CONSULT:   Diagnosis: Polycythemia  Current Therapy:  ***  HISTORY OF PRESENT ILLNESS:   Grettell is a 62 y.o. female presenting to clinic today for evaluation of Polycythemia at the request of Dr.Scott Lurking.    Today, she states that she is doing well overall. Her appetite level is at ***%. Her energy level is at ***%.   PAST MEDICAL HISTORY:   Past Medical History: Past Medical History:  Diagnosis Date   Arthritis    Bell's palsy    Dysrhythmia, cardiac    Fibromyalgia    Hashimoto's disease    History of CT scan 06/2017   "coronary CT scan on 06/01/2017, this did not find any cardiac calcifications. Coronary calcium score of zero."   Hypertension    Thyroid disease    Urticaria     Surgical History: Past Surgical History:  Procedure Laterality Date   BIOPSY  11/24/2021   Procedure: BIOPSY;  Surgeon: Harvel Quale, MD;  Location: [facility];  Service: Gastroenterology;;   COLONOSCOPY N/A 01/23/2014   Procedure: COLONOSCOPY;  Surgeon: Rogene Houston, MD;  Location: [facility];  Service: Endoscopy;  Laterality: N/A;  200   COLONOSCOPY WITH PROPOFOL N/A 11/24/2021   Procedure: COLONOSCOPY WITH PROPOFOL;  Surgeon: Harvel Quale, MD;  Location: [facility];  Service: Gastroenterology;  Laterality: N/A;  205   ESOPHAGOGASTRODUODENOSCOPY (EGD) WITH PROPOFOL N/A 11/24/2021   Procedure: ESOPHAGOGASTRODUODENOSCOPY (EGD) WITH PROPOFOL;  Surgeon: Harvel Quale, MD;  [facility]: [facility];  Service: Gastroenterology;  Laterality: N/A;   POLYPECTOMY  11/24/2021   Procedure: POLYPECTOMY;  Surgeon: Montez Morita, Quillian Quince, MD;  Location: [facility];  Service: Gastroenterology;;   TONSILLECTOMY      Social History: Social History   Socioeconomic History   Marital status: Married    Spouse name: Not on file   Number of children: Not on file   Years of education: Not on file   Highest education level: Not on file  Occupational History   Not on file  Tobacco Use   Smoking status: Former    Packs/day: 1.00    Years: 20.00    Total pack years: 20.00    Types: Cigarettes    Quit date: 11/08/2003    Years since quitting: 19.1   Smokeless tobacco: Never   Tobacco comments:    quit smoking 10 yrs   Vaping Use   Vaping Use: Never used  Substance and Sexual Activity   Alcohol use: No   Drug use: No   Sexual activity: Yes    Birth control/protection: Post-menopausal  Other Topics Concern   Not on file  Social History Narrative   Not on file   Social Determinants of Health   Financial  Resource Strain: Not on file  Food Insecurity: Not on file  Transportation Needs: Not on file  Physical Activity: Not on file  Stress: Not on file  Social Connections: Not on file  Intimate Partner Violence: Not on file    Family History: Family History  Adopted: Yes  Problem Relation Age of Onset   Cancer Mother        lung   Other Father    Other Daughter        Lyme's disease    Current Medications:  Current Outpatient Medications:    albuterol (PROVENTIL) (2.5 MG/3ML) 0.083% nebulizer solution, Take 3 mLs (2.5 mg total) by nebulization every 6 (six) hours as needed for wheezing or shortness  of breath., Disp: 75 mL, Rfl: 1   albuterol (VENTOLIN HFA) 108 (90 Base) MCG/ACT inhaler, Inhale 2 puffs into the lungs every 6 (six) hours as needed for wheezing or shortness of breath., Disp: 18 g, Rfl: 1   ALPRAZolam (XANAX) 0.5 MG tablet, Take 1 tablet (0.5 mg total) by mouth 2 (two) times daily as needed. for anxiety, Disp: 20 tablet, Rfl: 1   Ascorbic Acid (VITAMIN C) 500 MG CAPS, Take 1 capsule by mouth daily., Disp: , Rfl:    B Complex-C (SUPER B COMPLEX PO), Take by mouth daily at 6 (six) AM., Disp: , Rfl:    Bacillus Coagulans-Inulin (PROBIOTIC) 1-250 BILLION-MG CAPS, Take 1 capsule by mouth daily., Disp: , Rfl:    budesonide-formoterol (SYMBICORT) 80-4.5 MCG/ACT inhaler, Inhale 2 puffs twice a day with spacer to help prevent cough and wheeze, Disp: 1 each, Rfl: 3   cetirizine (ZYRTEC) 10 MG tablet, Take 2 tablets (20 mg total) by mouth 2 (two) times daily., Disp: 120 tablet, Rfl: 5   Cholecalciferol (VITAMIN D-3) 5000 UNITS TABS, Take 1 tablet by mouth daily., Disp: , Rfl:    cromolyn (GASTROCROM) 100 MG/5ML solution, Take 5 mLs (100 mg total) by mouth in the morning, at noon, in the evening, and at bedtime., Disp: 600 mL, Rfl: 5   cromolyn (INTAL) 20 MG/2ML nebulizer solution, Take 2 mLs (20 mg total) by nebulization in the morning, at noon, in the evening, and at bedtime., Disp: 120 mL, Rfl: 5   EPINEPHrine 0.3 mg/0.3 mL IJ SOAJ injection, Inject 0.3 mg into the muscle as needed for anaphylaxis., Disp: 1 each, Rfl: 1   famotidine (PEPCID) 40 MG tablet, Take 1 tablet (40 mg total) by mouth 2 (two) times daily., Disp: 60 tablet, Rfl: 5   furosemide (LASIX) 20 MG tablet, TAKE 2 TABLETS BY MOUTH IN THE MORNING AS NEEDED, Disp: 60 tablet, Rfl: 0   hydrocortisone (ANUSOL-HC) 2.5 % rectal cream, Place 1 Application rectally 3 (three) times daily. (Patient not taking: Reported on 12/27/2022), Disp: 56 g, Rfl: 1   hydrOXYzine (ATARAX) 25 MG tablet, Take 2 tablets (50 mg total) by mouth at bedtime.,  Disp: 60 tablet, Rfl: 5   ipratropium (ATROVENT) 0.03 % nasal spray, Place 1 spray in each nostril up to 3 times a day as needed for runny nose, Disp: 30 mL, Rfl: 3   Magnesium 200 MG TABS, Take 1 tablet by mouth daily., Disp: , Rfl:    NON FORMULARY, Take 500 mg by mouth Daily., Disp: , Rfl:    nystatin (MYCOSTATIN) 100000 UNIT/ML suspension, 1 teaspoon swish and swallow, 4 times daily, 7 days for thrush symptoms, Disp: 60 mL, Rfl: 0   nystatin cream (MYCOSTATIN), Apply 1 Application topically 2 (two) times daily., Disp:  30 g, Rfl: 2   Omega-3 Fatty Acids (FISH OIL) 1200 MG CAPS, Take by mouth., Disp: , Rfl:    pantoprazole (PROTONIX) 20 MG tablet, Take one tablet 30 minutes before breakfast, Disp: 30 tablet, Rfl: 0   potassium chloride SA (KLOR-CON M) 20 MEQ tablet, Take 1 tablet by mouth once daily, Disp: 30 tablet, Rfl: 0   predniSONE (DELTASONE) 10 MG tablet, Take two tablets ('20mg'$ ) twice daily for three days, then one tablet ('10mg'$ ) twice daily for three days, then STOP., Disp: 18 tablet, Rfl: 0   progesterone (PROMETRIUM) 100 MG capsule, Take 1 capsule (100 mg total) by mouth daily., Disp: 90 capsule, Rfl: 4   RESTASIS 0.05 % ophthalmic emulsion, 1 drop 2 (two) times daily., Disp: , Rfl:    rosuvastatin (CRESTOR) 5 MG tablet, Take 1 tablet (5 mg total) by mouth daily., Disp: 30 tablet, Rfl: 5   thyroid (NP THYROID) 60 MG tablet, TAKE 1 TABLET BY MOUTH ONCE DAILY BEFORE BREAKFAST, Disp: 30 tablet, Rfl: 5   trimethoprim (TRIMPEX) 100 MG tablet, Take 1 tablet (100 mg total) by mouth daily. (Patient not taking: Reported on 12/27/2022), Disp: 45 tablet, Rfl: 0   Allergies: Allergies  Allergen Reactions   Bee Venom Anaphylaxis and Hives   Molds & Smuts Shortness Of Breath   Shellfish Allergy Anaphylaxis and Hives   Avelox [Moxifloxacin Hcl In Nacl] Other (See Comments)    "heart beats weird"   Biaxin [Clarithromycin]     Nausea and heart palpitations   Tape Rash    REVIEW OF SYSTEMS:    Review of Systems - Oncology   VITALS:   Last menstrual period 01/23/2014.  Wt Readings from Last 3 Encounters:  12/27/22 (!) 305 lb (138.3 kg)  12/25/22 (!) 306 lb (138.8 kg)  12/10/22 (!) 307 lb (139.3 kg)    There is no height or weight on file to calculate BMI.   PHYSICAL EXAM:   Physical Exam  LABS:      Latest Ref Rng & Units 12/25/2022    5:31 PM 11/29/2022    5:24 PM 08/13/2022   11:48 AM  CBC  WBC 4.0 - 10.5 K/uL 7.4  7.0  8.0   Hemoglobin 12.0 - 15.0 g/dL 16.5  16.4  16.9   Hematocrit 36.0 - 46.0 % 50.0  49.1  50.2   Platelets 150 - 400 K/uL 257  252  261       Latest Ref Rng & Units 12/25/2022    5:31 PM 08/13/2022   11:48 AM 03/23/2022    5:13 PM  CMP  Glucose 70 - 99 mg/dL 116  126  126   BUN 6 - 20 mg/dL '17  15  15   '$ Creatinine 0.44 - 1.00 mg/dL 0.69  0.64  0.72   Sodium 135 - 145 mmol/L 136  141  137   Potassium 3.5 - 5.1 mmol/L 3.5  3.8  3.8   Chloride 98 - 111 mmol/L 103  105  103   CO2 22 - 32 mmol/L '24  28  26   '$ Calcium 8.9 - 10.3 mg/dL 8.9  9.1  9.0   Total Protein 6.5 - 8.1 g/dL   7.3   Total Bilirubin 0.3 - 1.2 mg/dL   1.0   Alkaline Phos 38 - 126 U/L   83   AST 15 - 41 U/L   29   ALT 0 - 44 U/L   23      No results found  for: "CEA1", "CEA" / No results found for: "CEA1", "CEA" No results found for: "PSA1" No results found for: "[id number]" No results found for: "CAN125"  No results found for: "TOTALPROTELP", "ALBUMINELP", "A1GS", "A2GS", "BETS", "BETA2SER", "GAMS", "MSPIKE", "SPEI" No results found for: "TIBC", "FERRITIN", "IRONPCTSAT" No results found for: "LDH"   STUDIES:   US ARTERIAL ABI (SCREENING LOWER EXTREMITY)  Result Date: 12/27/2022 CLINICAL DATA:  62 year old female with venous stasis EXAM: NONINVASIVE PHYSIOLOGIC VASCULAR STUDY OF BILATERAL LOWER EXTREMITIES TECHNIQUE: Evaluation of both lower extremities was performed at rest, including calculation of ankle-brachial indices, multiple segmental pressure evaluation,  segmental Doppler and segmental pulse volume recording. COMPARISON:  None Available. FINDINGS: Right ABI:  0.90 Left ABI:  0.92 Right Lower Extremity: Segmental Doppler at the right ankle demonstrates multiphasic waveforms Left Lower Extremity: Segmental Doppler at the left ankle demonstrates multiphasic waveforms IMPRESSION: Resting ABI of the bilateral lower extremities borderline though within normal limits. Segmental exam demonstrates waveforms maintained at the ankles Signed, Dulcy Fanny. Nadene Rubins, RPVI [facility] Radiology Electronically Signed   By: Corrie Mckusick D.O.   On: 12/27/2022 11:43   CT Renal Stone Study  Result Date: 12/25/2022 CLINICAL DATA:  62 year old female with acute abdominal and pelvic pain with hematuria. EXAM: CT ABDOMEN AND PELVIS WITHOUT CONTRAST TECHNIQUE: Multidetector CT imaging of the abdomen and pelvis was performed following the standard protocol without IV contrast. RADIATION DOSE REDUCTION: This exam was performed according to the departmental dose-optimization program which includes automated exposure control, adjustment of the mA and/or kV according to patient size and/or use of iterative reconstruction technique. COMPARISON:  01/23/2022 CT FINDINGS: Please note that parenchymal and vascular abnormalities may be missed as intravenous contrast was not administered. Lower chest: No acute abnormality Hepatobiliary: The liver and gallbladder are unremarkable. There is no evidence of intrahepatic or extrahepatic biliary dilatation. Pancreas: Unremarkable Spleen: Unremarkable Adrenals/Urinary Tract: A RIGHT renal cyst is again noted for which no follow-up imaging is recommended. The kidneys, adrenal glands and bladder are otherwise unremarkable. There is no evidence of hydronephrosis or urinary calculi. Stomach/Bowel: Stomach is within normal limits. Appendix appears normal. No evidence of bowel wall thickening, distention, or  inflammatory changes. Vascular/Lymphatic: No significant vascular findings are present. No enlarged abdominal or pelvic lymph nodes. Reproductive: Uterus and bilateral adnexa are unremarkable. Other: No ascites, focal collection or pneumoperitoneum. Musculoskeletal: No acute or suspicious bony abnormalities are noted. IMPRESSION: No evidence of acute abnormality. No evidence of hydronephrosis or urinary calculi. Electronically Signed   By: Margarette Canada M.D.   On: 12/25/2022 19:19

## 2022-12-31 ENCOUNTER — Inpatient Hospital Stay: Payer: Medicaid Other | Attending: Hematology | Admitting: Hematology

## 2022-12-31 ENCOUNTER — Other Ambulatory Visit: Payer: Medicaid Other

## 2022-12-31 ENCOUNTER — Other Ambulatory Visit (HOSPITAL_COMMUNITY): Payer: Self-pay

## 2022-12-31 ENCOUNTER — Other Ambulatory Visit (HOSPITAL_COMMUNITY)
Admission: RE | Admit: 2022-12-31 | Discharge: 2022-12-31 | Disposition: A | Discharge status: Home / Self Care | Payer: Medicaid Other | Source: Ambulatory Visit | Attending: Hematology | Admitting: Hematology

## 2022-12-31 VITALS — BP 156/96 | HR 79 | Temp 98.0°F | Resp 18 | Ht 65.0 in | Wt 306.0 lb

## 2022-12-31 DIAGNOSIS — N281 Cyst of kidney, acquired: Secondary | ICD-10-CM | POA: Diagnosis not present

## 2022-12-31 DIAGNOSIS — D751 Secondary polycythemia: Secondary | ICD-10-CM | POA: Diagnosis not present

## 2022-12-31 DIAGNOSIS — R233 Spontaneous ecchymoses: Secondary | ICD-10-CM | POA: Insufficient documentation

## 2022-12-31 DIAGNOSIS — Z780 Asymptomatic menopausal state: Secondary | ICD-10-CM | POA: Insufficient documentation

## 2022-12-31 DIAGNOSIS — Z87891 Personal history of nicotine dependence: Secondary | ICD-10-CM | POA: Insufficient documentation

## 2022-12-31 DIAGNOSIS — D582 Other hemoglobinopathies: Secondary | ICD-10-CM

## 2022-12-31 LAB — PROTIME-INR
INR: 1 (ref 0.8–1.2)
Prothrombin Time: 12.6 seconds (ref 11.4–15.2)

## 2022-12-31 LAB — APTT: aPTT: 31 seconds (ref 24–36)

## 2022-12-31 LAB — FIBRINOGEN: Fibrinogen: 436 mg/dL (ref 210–475)

## 2022-12-31 MED ORDER — CROMOLYN SODIUM 20 MG/2ML IN NEBU: 20.0000 mg | INHALATION_SOLUTION | Freq: Four times a day (QID) | RESPIRATORY_TRACT | 5 refills | Status: DC

## 2022-12-31 NOTE — Telephone Encounter (Signed)
Patient Advocate Encounter  This medication does not require a prior authorization at this time. Test claim shows co-pay of $3.00. Insurance used:  BIN [id number] PCN [id number]  GRP [place] ID [id number]

## 2022-12-31 NOTE — Addendum Note (Signed)
Addended by: Tommas Olp B on: 12/31/2022 05:25 PM   Modules accepted: Orders

## 2022-12-31 NOTE — Patient Instructions (Addendum)
[facility]  Discharge Instructions  You were seen and examined today by Dr. Delton Coombes. Dr. Delton Coombes is a hematologist, meaning that he specializes in blood abnormalities. Dr. Delton Coombes discussed your past medical history, family history of cancers/blood conditions and the events that led to you being here today.  You were referred to Dr. Delton Coombes due to having too much blood.   Dr. Delton Coombes has recommended additional labs today for further evaluation.  Follow-up as scheduled.  Thank you for choosing [facility] to provide your oncology and hematology care.   To afford each patient quality time with our provider, please arrive at least 15 minutes before your scheduled appointment time. You may need to reschedule your appointment if you arrive late (10 or more minutes). Arriving late affects you and other patients whose appointments are after yours.  Also, if you miss three or more appointments without notifying the office, you may be dismissed from the clinic at the provider's discretion.    Again, thank you for choosing [facility].  Our hope is that these requests will decrease the amount of time that you wait before being seen by our physicians.   If you have a lab appointment with the [facility] please come in thru the Main Entrance and check in at the main information desk.           _____________________________________________________________  Should you have questions after your visit to [facility], please contact our office at [phone] and follow the prompts.  Our office hours are 8:00 a.m. to 4:30 p.m. Monday - Thursday and 8:00 a.m. to 2:30 p.m. Friday.  Please note that voicemails left after 4:00 p.m. may not be returned until the following business day.  We are closed weekends and all major holidays.  You do have access to a nurse 24-7, just call the main number to the clinic  [phone] and do not press any options, hold on the line and a nurse will answer the phone.    For prescription refill requests, have your pharmacy contact our office and allow 72 hours.    Masks are optional in the cancer centers. If you would like for your care team to wear a mask while they are taking care of you, please let them know. You may have one support person who is at least 60 years old accompany you for your appointments.

## 2022-12-31 NOTE — Progress Notes (Signed)
Summer [name] [address]  [address]   Clinic Day:  12/31/2022  Referring physician: Kathyrn Drown, MD  Patient Care Team: Kathyrn Drown, MD as PCP - General (Family Medicine) Herminio Commons, MD (Inactive) as PCP - Cardiology (Cardiology) [name], MD as Medical Oncologist (Hematology)   ASSESSMENT & PLAN:   Assessment:  1.  Erythrocytosis: - Patient seen at the request of Dr. Wolfgang Phoenix. - CBC (12/25/2022): Hb-16.5, HCT-50, RBC-5.56.  WBC and PLT normal. - She had elevated hemoglobin, RBC count and HCT consistently since December 2018. - CT renal study (12/25/2022): Normal spleen and liver.  Right renal cyst noted. - Uses Symbicort inhaler for 6 months and Lasix for 2 years. - She reports signs and symptoms of sleep apnea and is in the process of getting tested for it.  She feels excessively sleepy during daytime and wakes up in the middle of night gasping for breath. - Reports occasional itching after hot showers lasting 10 to 15 minutes.  She also has mast cell activation disorder and gets itching from it.  She reports that her toes turn purple when exposed to cold and sometimes hurt.  Also reports occasional pins-and-needles in the fingertips and toes. - She reports "bleeding episodes" involving vaginal, urethral and rectal bleeding lasting between 5 days and 2 weeks about 2 times per year for the last 3 years. - She had tonsillectomy at age 4 and wisdom teeth removed after high school with no extra bleeding complications.  2.  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  Quit smoking 15 years ago.  Smoked 1 pack/day for 20 years. - No family history of polycythemia vera.  Biological mother died of small cell lung cancer.  Patient was adopted and her knowledge of biological family is limited.  Plan:  1.  Erythrocytosis: -We talked about secondary and clonal causes of  erythrocytosis. - Most likely etiology is sleep apnea. - Will check serum EPO level and JAK2 V617F mutation to rule out clonal causes. - RTC 4 weeks for follow-up.  2.  Bleeding episodes: - She has easy bruising and used to have heavy menstruation. - Recommend PT, PTT and fibrinogen. - Will also check von Willebrand panel.  Orders Placed This Encounter  Procedures   Erythropoietin    Standing Status:   Future    Number of Occurrences:   1    Standing Expiration Date:   12/31/2023   JAK2 V617F rfx CALR/MPL/E12-15    Standing Status:   Future    Number of Occurrences:   1    Standing Expiration Date:   12/31/2023   Protime-INR    Standing Status:   Future    Number of Occurrences:   1    Standing Expiration Date:   12/31/2023   APTT    Standing Status:   Future    Number of Occurrences:   1    Standing Expiration Date:   12/31/2023   Fibrinogen    Standing Status:   Future    Number of Occurrences:   1    Standing Expiration Date:   12/31/2023   Von Willebrand multimeric    Standing Status:   Future    Number of Occurrences:   1    Standing Expiration Date:   12/31/2023   Von Willebrand antigen    Standing Status:   Future    Number of Occurrences:  1    Standing Expiration Date:   12/31/2023      I,[name],acting as a scribe for [name], MD.,have documented all relevant documentation on the behalf of [name], MD,as directed by  [name], MD while in the presence of [name], MD.   I, [name] MD, have reviewed the above documentation for accuracy and completeness, and I agree with the above.   [name], MD   3/1/202411:46 AM  CHIEF COMPLAINT/PURPOSE OF CONSULT:   Diagnosis: Polycythemia  Current Therapy: Under workup  HISTORY OF PRESENT ILLNESS:   Summer [name] is a 62 y.o. female presenting to clinic today for evaluation of polycythemia at the request of Dr. Wolfgang Phoenix.  Patient was seen in the ED on  12/25/22 for postmenopausal vaginal bleeding. Per ED note from that visit: "Pt is a 62 yo female with pmhx significant for fibromyalgia, arthritis, htn, bells palsy, hypothyroidism, asthma, and anxiety. Pt has had some post-menopausal vaginal bleeding in the past. She has had endometrial biopsies which were nl (per pt). She did see Dr. Nelda Marseille in July of 2023 and there was a plan for a hysteroscopy and d&C. However, that was canceled because pt's son came home from the army. Pt said she never rescheduled. She was given a rx for progesterone, but she never took it. She has seen urology and GI as well for possible rectal and bleeding with urination. Colonoscopy and cystoscopy neg last year. Pt has an appt with Dr. Nelda Marseille on Monday the 26th. She started having bleeding again today. She had some back pain. She did see urology on the 20th and had a negative urine cx. "  She was seen by her OBGYN on 12/27/22 and prescribed progesterone '100mg'$  daily.  She has not started it yet.  She is scheduled for D&C on 02/08/23 with her OB Dr. Nelda Marseille.  Per her CBC results from 12/25/22: Her HGB was 16.5, HCT was 50.0, PLT were 257K, and RBC were 5.56.   Today, she states that she is doing well overall. Her appetite level is at 75%. Her energy level is at 70%.  Her last colonoscopy was 11/24/21. She reports occasional itching after hot showers lasting 10 to 15 minutes.  Also reports toes get purple when exposed to cold and sometimes hard.  Wakes up in the middle of the night gasping for breath and also feels sleepy during daytime.  Also reports easy bruising for the last few years.  Used to have heavy menstrual cycles.    PAST MEDICAL HISTORY:   Past Medical History: Past Medical History:  Diagnosis Date   Arthritis    Bell's palsy    Dysrhythmia, cardiac    Fibromyalgia    Hashimoto's disease    History of CT scan 06/2017   "coronary CT scan on 06/01/2017, this did not find any cardiac calcifications. Coronary calcium  score of zero."   Hypertension    Thyroid disease    Urticaria     Surgical History: Past Surgical History:  Procedure Laterality Date   BIOPSY  11/24/2021   Procedure: BIOPSY;  Surgeon: Harvel Quale, MD;  [facility];  Service: Gastroenterology;;   COLONOSCOPY N/A 01/23/2014   Procedure: COLONOSCOPY;  Surgeon: Rogene Houston, MD;  [facility]: [facility];  Service: Endoscopy;  Laterality: N/A;  200   COLONOSCOPY WITH PROPOFOL N/A 11/24/2021   Procedure: COLONOSCOPY WITH PROPOFOL;  Surgeon: Harvel Quale, MD;  Location: [facility];  Service: Gastroenterology;  Laterality:  N/A;  205   ESOPHAGOGASTRODUODENOSCOPY (EGD) WITH PROPOFOL N/A 11/24/2021   Procedure: ESOPHAGOGASTRODUODENOSCOPY (EGD) WITH PROPOFOL;  Surgeon: Harvel Quale, MD;  [facility]: [facility];  Service: Gastroenterology;  Laterality: N/A;   POLYPECTOMY  11/24/2021   Procedure: POLYPECTOMY;  Surgeon: Montez Morita, Quillian Quince, MD;  Location: [facility];  Service: Gastroenterology;;   TONSILLECTOMY      Social History: Social History   Socioeconomic History   Marital status: Married    Spouse name: Not on file   Number of children: Not on file   Years of education: Not on file   Highest education level: Not on file  Occupational History   Not on file  Tobacco Use   Smoking status: Former    Packs/day: 1.00    Years: 20.00    Total pack years: 20.00    Types: Cigarettes    Quit date: 11/08/2003    Years since quitting: 19.1   Smokeless tobacco: Never   Tobacco comments:    quit smoking 10 yrs   Vaping Use   Vaping Use: Never used  Substance and Sexual Activity   Alcohol use: No   Drug use: No   Sexual activity: Yes    Birth control/protection: Post-menopausal  Other Topics Concern   Not on file  Social History Narrative   Not on file   Social Determinants of Health   Financial Resource Strain: Not on file  Food Insecurity: Not on file   Transportation Needs: Not on file  Physical Activity: Not on file  Stress: Not on file  Social Connections: Not on file  Intimate Partner Violence: Not on file    Family History: Family History  Adopted: Yes  Problem Relation Age of Onset   Cancer Mother        lung   Other Father    Other Daughter        Lyme's disease    Current Medications:  Current Outpatient Medications:    albuterol (PROVENTIL) (2.5 MG/3ML) 0.083% nebulizer solution, Take 3 mLs (2.5 mg total) by nebulization every 6 (six) hours as needed for wheezing or shortness of breath., Disp: 75 mL, Rfl: 1   albuterol (VENTOLIN HFA) 108 (90 Base) MCG/ACT inhaler, Inhale 2 puffs into the lungs every 6 (six) hours as needed for wheezing or shortness of breath., Disp: 18 g, Rfl: 1   ALPRAZolam (XANAX) 0.5 MG tablet, Take 1 tablet (0.5 mg total) by mouth 2 (two) times daily as needed. for anxiety, Disp: 20 tablet, Rfl: 1   Ascorbic Acid (VITAMIN C) 500 MG CAPS, Take 1 capsule by mouth daily., Disp: , Rfl:    B Complex-C (SUPER B COMPLEX PO), Take by mouth daily at 6 (six) AM., Disp: , Rfl:    Bacillus Coagulans-Inulin (PROBIOTIC) 1-250 BILLION-MG CAPS, Take 1 capsule by mouth daily., Disp: , Rfl:    budesonide-formoterol (SYMBICORT) 80-4.5 MCG/ACT inhaler, Inhale 2 puffs twice a day with spacer to help prevent cough and wheeze, Disp: 1 each, Rfl: 3   cetirizine (ZYRTEC) 10 MG tablet, Take 2 tablets (20 mg total) by mouth 2 (two) times daily., Disp: 120 tablet, Rfl: 5   Cholecalciferol (VITAMIN D-3) 5000 UNITS TABS, Take 1 tablet by mouth daily., Disp: , Rfl:    cromolyn (GASTROCROM) 100 MG/5ML solution, Take 5 mLs (100 mg total) by mouth in the morning, at noon, in the evening, and at bedtime., Disp: 600 mL, Rfl: 5   cromolyn (INTAL) 20 MG/2ML nebulizer solution, Take 2  mLs (20 mg total) by nebulization in the morning, at noon, in the evening, and at bedtime., Disp: 120 mL, Rfl: 5   EPINEPHrine 0.3 mg/0.3 mL IJ SOAJ  injection, Inject 0.3 mg into the muscle as needed for anaphylaxis., Disp: 1 each, Rfl: 1   famotidine (PEPCID) 40 MG tablet, Take 1 tablet (40 mg total) by mouth 2 (two) times daily., Disp: 60 tablet, Rfl: 5   furosemide (LASIX) 20 MG tablet, TAKE 2 TABLETS BY MOUTH IN THE MORNING AS NEEDED, Disp: 60 tablet, Rfl: 0   hydrOXYzine (ATARAX) 25 MG tablet, Take 2 tablets (50 mg total) by mouth at bedtime., Disp: 60 tablet, Rfl: 5   ipratropium (ATROVENT) 0.03 % nasal spray, Place 1 spray in each nostril up to 3 times a day as needed for runny nose, Disp: 30 mL, Rfl: 3   Magnesium 200 MG TABS, Take 1 tablet by mouth daily., Disp: , Rfl:    NON FORMULARY, Take 500 mg by mouth Daily., Disp: , Rfl:    nystatin (MYCOSTATIN) 100000 UNIT/ML suspension, 1 teaspoon swish and swallow, 4 times daily, 7 days for thrush symptoms, Disp: 60 mL, Rfl: 0   nystatin cream (MYCOSTATIN), Apply 1 Application topically 2 (two) times daily., Disp: 30 g, Rfl: 2   Omega-3 Fatty Acids (FISH OIL) 1200 MG CAPS, Take by mouth., Disp: , Rfl:    pantoprazole (PROTONIX) 20 MG tablet, Take one tablet 30 minutes before breakfast, Disp: 30 tablet, Rfl: 0   potassium chloride SA (KLOR-CON M) 20 MEQ tablet, Take 1 tablet by mouth once daily, Disp: 30 tablet, Rfl: 0   progesterone (PROMETRIUM) 100 MG capsule, Take 1 capsule (100 mg total) by mouth daily., Disp: 90 capsule, Rfl: 4   RESTASIS 0.05 % ophthalmic emulsion, 1 drop 2 (two) times daily., Disp: , Rfl:    rosuvastatin (CRESTOR) 5 MG tablet, Take 1 tablet (5 mg total) by mouth daily., Disp: 30 tablet, Rfl: 5   thyroid (NP THYROID) 60 MG tablet, TAKE 1 TABLET BY MOUTH ONCE DAILY BEFORE BREAKFAST, Disp: 30 tablet, Rfl: 5   trimethoprim (TRIMPEX) 100 MG tablet, Take 1 tablet (100 mg total) by mouth daily., Disp: 45 tablet, Rfl: 0   Allergies: Allergies  Allergen Reactions   Bee Venom Anaphylaxis and Hives   Molds & Smuts Shortness Of Breath   Shellfish Allergy Anaphylaxis and Hives    Avelox [Moxifloxacin Hcl In Nacl] Other (See Comments)    "heart beats weird"   Biaxin [Clarithromycin]     Nausea and heart palpitations   Tape Rash    REVIEW OF SYSTEMS:   Review of Systems  Respiratory:  Positive for chest tightness and shortness of breath.   Cardiovascular:  Positive for palpitations.  Gastrointestinal:  Positive for nausea.  Skin:  Positive for itching.  Neurological:  Positive for dizziness, headaches and numbness.  Hematological:  Bruises/bleeds easily.  Psychiatric/Behavioral:  Positive for sleep disturbance.   All other systems reviewed and are negative.    VITALS:   Blood pressure (!) 156/96, pulse 79, temperature 98 F (36.7 C), temperature source Oral, resp. rate 18, height '5\' 5"'$  (1.651 m), weight (!) 306 lb (138.8 kg), last menstrual period 01/23/2014, SpO2 100 %.  Wt Readings from Last 3 Encounters:  12/31/22 (!) 306 lb (138.8 kg)  12/27/22 (!) 305 lb (138.3 kg)  12/25/22 (!) 306 lb (138.8 kg)    Body mass index is 50.92 kg/m.   PHYSICAL EXAM:   Physical Exam Vitals reviewed.  Constitutional:      Appearance: Normal appearance.  Cardiovascular:     Rate and Rhythm: Normal rate and regular rhythm.     Heart sounds: Normal heart sounds.  Pulmonary:     Effort: Pulmonary effort is normal.     Breath sounds: Normal breath sounds.  Abdominal:     General: There is no distension.     Palpations: Abdomen is soft. There is no mass.  Neurological:     Mental Status: She is alert.  Psychiatric:        Mood and Affect: Mood normal.        Behavior: Behavior normal.     LABS:      Latest Ref Rng & Units 12/25/2022    5:31 PM 11/29/2022    5:24 PM 08/13/2022   11:48 AM  CBC  WBC 4.0 - 10.5 K/uL 7.4  7.0  8.0   Hemoglobin 12.0 - 15.0 g/dL 16.5  16.4  16.9   Hematocrit 36.0 - 46.0 % 50.0  49.1  50.2   Platelets 150 - 400 K/uL 257  252  261       Latest Ref Rng & Units 12/25/2022    5:31 PM 08/13/2022   11:48 AM 03/23/2022    5:13  PM  CMP  Glucose 70 - 99 mg/dL 116  126  126   BUN 6 - 20 mg/dL '17  15  15   '$ Creatinine 0.44 - 1.00 mg/dL 0.69  0.64  0.72   Sodium 135 - 145 mmol/L 136  141  137   Potassium 3.5 - 5.1 mmol/L 3.5  3.8  3.8   Chloride 98 - 111 mmol/L 103  105  103   CO2 22 - 32 mmol/L '24  28  26   '$ Calcium 8.9 - 10.3 mg/dL 8.9  9.1  9.0   Total Protein 6.5 - 8.1 g/dL   7.3   Total Bilirubin 0.3 - 1.2 mg/dL   1.0   Alkaline Phos 38 - 126 U/L   83   AST 15 - 41 U/L   29   ALT 0 - 44 U/L   23      No results found for: "CEA1", "CEA" / No results found for: "CEA1", "CEA" No results found for: "PSA1" No results found for: "[id number]" No results found for: "CAN125"  No results found for: "TOTALPROTELP", "ALBUMINELP", "A1GS", "A2GS", "BETS", "BETA2SER", "GAMS", "MSPIKE", "SPEI" No results found for: "TIBC", "FERRITIN", "IRONPCTSAT" No results found for: "LDH"   STUDIES:   colonoscopy 11/24/21 - Hemorrhoids found on perianal exam. - Six 3 to 8 mm polyps in the transverse colon, in the ascending colon and in the cecum, removed with a cold snare. Resected and retrieved. - Two 1 to 2 mm polyps in the transverse colon, removed with a cold biopsy forceps. Resected and retrieved. - Medium-sized lipoma in the transverse colon. - Three 3 to 5 mm polyps in the rectum and in the sigmoid colon, removed with a cold snare. Resected and retrieved. - The entire examined colon is normal. Biopsied. - Internal hemorrhoids  Pathology 11/24/21: FINAL MICROSCOPIC DIAGNOSIS:  A. SMALL BOWEL, BIOPSY: - Duodenal mucosa with no significant pathologic alteration. - Negative for features of celiac disease. - Negative for dysplasia and malignancy.  B. STOMACH, BIOPSY: - Antral mucosa with no significant pathologic alteration. - Negative for active inflammation and H pylori. - Negative for intestinal metaplasia, dysplasia, and malignancy.  C. COLON, SIGMOID, RECTAL, POLYPECTOMY: - Tubular adenoma, one  fragment. - Hyperplastic  polyp, one fragment. - Submucosal lipoma, one fragment. - Negative for high grade dysplasia and malignancy.  D. COLON, TRANSVERSE, ASCENDING, POLYPECTOMY: - Tubular adenoma, multiple fragments. - Negative for high grade dysplasia and malignancy.  E. COLON, RANDOM, BIOPSY: - Colonic mucosa with no significant pathologic alteration. - Negative for active inflammation and features of chronicity. - Negative for dysplasia and malignancy.    US ARTERIAL ABI (SCREENING LOWER EXTREMITY)  Result Date: 12/27/2022 CLINICAL DATA:  62 year old female with venous stasis EXAM: NONINVASIVE PHYSIOLOGIC VASCULAR STUDY OF BILATERAL LOWER EXTREMITIES TECHNIQUE: Evaluation of both lower extremities was performed at rest, including calculation of ankle-brachial indices, multiple segmental pressure evaluation, segmental Doppler and segmental pulse volume recording. COMPARISON:  None Available. FINDINGS: Right ABI:  0.90 Left ABI:  0.92 Right Lower Extremity: Segmental Doppler at the right ankle demonstrates multiphasic waveforms Left Lower Extremity: Segmental Doppler at the left ankle demonstrates multiphasic waveforms IMPRESSION: Resting ABI of the bilateral lower extremities borderline though within normal limits. Segmental exam demonstrates waveforms maintained at the ankles Signed, Dulcy Fanny. Nadene Rubins, RPVI [facility] Radiology Electronically Signed   By: Corrie Mckusick D.O.   On: 12/27/2022 11:43   CT Renal Stone Study  Result Date: 12/25/2022 CLINICAL DATA:  62 year old female with acute abdominal and pelvic pain with hematuria. EXAM: CT ABDOMEN AND PELVIS WITHOUT CONTRAST TECHNIQUE: Multidetector CT imaging of the abdomen and pelvis was performed following the standard protocol without IV contrast. RADIATION DOSE REDUCTION: This exam was performed according to the departmental dose-optimization program which includes automated exposure control, adjustment  of the mA and/or kV according to patient size and/or use of iterative reconstruction technique. COMPARISON:  01/23/2022 CT FINDINGS: Please note that parenchymal and vascular abnormalities may be missed as intravenous contrast was not administered. Lower chest: No acute abnormality Hepatobiliary: The liver and gallbladder are unremarkable. There is no evidence of intrahepatic or extrahepatic biliary dilatation. Pancreas: Unremarkable Spleen: Unremarkable Adrenals/Urinary Tract: A RIGHT renal cyst is again noted for which no follow-up imaging is recommended. The kidneys, adrenal glands and bladder are otherwise unremarkable. There is no evidence of hydronephrosis or urinary calculi. Stomach/Bowel: Stomach is within normal limits. Appendix appears normal. No evidence of bowel wall thickening, distention, or inflammatory changes. Vascular/Lymphatic: No significant vascular findings are present. No enlarged abdominal or pelvic lymph nodes. Reproductive: Uterus and bilateral adnexa are unremarkable. Other: No ascites, focal collection or pneumoperitoneum. Musculoskeletal: No acute or suspicious bony abnormalities are noted. IMPRESSION: No evidence of acute abnormality. No evidence of hydronephrosis or urinary calculi. Electronically Signed   By: Margarette Canada M.D.   On: 12/25/2022 19:19

## 2023-01-01 LAB — ERYTHROPOIETIN: Erythropoietin: 13.3 m[IU]/mL (ref 2.6–18.5)

## 2023-01-04 ENCOUNTER — Other Ambulatory Visit: Payer: Self-pay | Admitting: Family Medicine

## 2023-01-04 DIAGNOSIS — Z1231 Encounter for screening mammogram for malignant neoplasm of breast: Secondary | ICD-10-CM

## 2023-01-04 LAB — VON WILLEBRAND ANTIGEN: Von Willebrand Antigen, Plasma: 141 % (ref 50–200)

## 2023-01-05 ENCOUNTER — Encounter: Payer: Self-pay | Admitting: Family Medicine

## 2023-01-05 ENCOUNTER — Ambulatory Visit (INDEPENDENT_AMBULATORY_CARE_PROVIDER_SITE_OTHER): Payer: Medicaid Other | Admitting: Family Medicine

## 2023-01-05 VITALS — BP 138/88 | HR 86 | Wt 309.4 lb

## 2023-01-05 DIAGNOSIS — U099 Post covid-19 condition, unspecified: Secondary | ICD-10-CM | POA: Diagnosis not present

## 2023-01-05 DIAGNOSIS — E785 Hyperlipidemia, unspecified: Secondary | ICD-10-CM | POA: Diagnosis not present

## 2023-01-05 DIAGNOSIS — I878 Other specified disorders of veins: Secondary | ICD-10-CM

## 2023-01-05 DIAGNOSIS — E039 Hypothyroidism, unspecified: Secondary | ICD-10-CM

## 2023-01-05 DIAGNOSIS — R0609 Other forms of dyspnea: Secondary | ICD-10-CM

## 2023-01-05 DIAGNOSIS — R000 Tachycardia, unspecified: Secondary | ICD-10-CM

## 2023-01-05 MED ORDER — FUROSEMIDE 20 MG PO TABS: ORAL_TABLET | ORAL | 5 refills | Status: DC

## 2023-01-05 MED ORDER — POTASSIUM CHLORIDE CRYS ER 20 MEQ PO TBCR: EXTENDED_RELEASE_TABLET | ORAL | 5 refills | Status: DC

## 2023-01-05 MED ORDER — ROSUVASTATIN CALCIUM 5 MG PO TABS: 5.0000 mg | ORAL_TABLET | Freq: Every day | ORAL | 5 refills | Status: AC

## 2023-01-05 MED ORDER — THYROID 60 MG PO TABS: ORAL_TABLET | ORAL | 5 refills | Status: DC

## 2023-01-05 NOTE — Progress Notes (Signed)
   Subjective:    Patient ID: Summer [name], female    DOB: Mar 21, 1961, 62 y.o.   MRN: [id number]  HPI Patient arrives today sore on left foot, located on small toe.  Patient continues to have purple distal feet on both sides.  She states she did not start getting this until after she had COVID After she had COVID she had increased fatigue tiredness shortness of breath with activity and tachycardia spells. She denies any chest pressure denies coughing up any phlegm she also states that time she was worried about developing a sore or ulcer on her feet but she feels like that is doing a little better now she does try to keep her feet warm try to keep her feet propped up.   Review of Systems     Objective:   Physical Exam Lungs clear heart regular pulse normal blood pressure acceptable  In her feet I can feel distal pulses but they are not super strong.  She also has significant purple appearance to her distal feet bilateral no venous ulcers or arterial ulcers seen She has had a previous ABI Borderline findings       Assessment & Plan:  1. Venous stasis Even though I think this is venous stasis because her ABI was below the normal cutoff we will go ahead and get consultation with vascular surgery to make sure she does not have a compromise arterial flow she was encouraged to wear warm socks keep her feet propped up when she is at rest follow-up within 6 weeks - Ambulatory referral to Vascular Surgery  2. DOE (dyspnea on exertion) She continues to have shortness of breath and tachycardia with activity.  Because of all this we will help set her up with cardiology [facility] in [facility] - Ambulatory referral to Vascular Surgery  3. Hypothyroidism, unspecified type Patient does have some history of Hashimoto's disease trying to get in with endocrinology but that it has been difficult we will repeat lab work. - Thyroid Peroxidase Antibodies (TPO) (REFL) - TSH + free T4 -  Lipid Panel - Basic Metabolic Panel  Hyperlipidemia as well Tachycardia intermittently along with shortness of breath and fatigue tiredness all occurring after COVID we will go ahead and get cardiology consultation Diuretic and potassium as needed for edema

## 2023-01-07 ENCOUNTER — Telehealth: Payer: Self-pay | Admitting: Family Medicine

## 2023-01-07 DIAGNOSIS — R000 Tachycardia, unspecified: Secondary | ICD-10-CM

## 2023-01-07 DIAGNOSIS — U099 Post covid-19 condition, unspecified: Secondary | ICD-10-CM

## 2023-01-07 LAB — VON WILLEBRAND FACTOR MULTIMER

## 2023-01-07 NOTE — Telephone Encounter (Signed)
Nurses Go ahead with referral to cardiology [place] Diagnosis Post COVID syndrome, tachycardia  Not emergent

## 2023-01-10 ENCOUNTER — Ambulatory Visit
Admission: RE | Admit: 2023-01-10 | Discharge: 2023-01-10 | Disposition: A | Discharge status: Home / Self Care | Payer: 59 | Source: Ambulatory Visit | Attending: Nurse Practitioner | Admitting: Nurse Practitioner

## 2023-01-10 ENCOUNTER — Ambulatory Visit
Admission: RE | Admit: 2023-01-10 | Discharge: 2023-01-10 | Disposition: A | Discharge status: Home / Self Care | Payer: Medicaid Other | Source: Ambulatory Visit | Attending: Nurse Practitioner | Admitting: Nurse Practitioner

## 2023-01-10 DIAGNOSIS — N644 Mastodynia: Secondary | ICD-10-CM | POA: Diagnosis not present

## 2023-01-10 LAB — CALR +MPL + E12-E15  (REFLEX)

## 2023-01-10 LAB — JAK2 V617F RFX CALR/MPL/E12-15

## 2023-01-10 NOTE — Telephone Encounter (Signed)
Referral placed as requested per Dr Nicki Reaper  go ahead with referral to cardiology [place].

## 2023-01-11 ENCOUNTER — Telehealth: Payer: Self-pay

## 2023-01-11 NOTE — Telephone Encounter (Signed)
I have received two phone calls from [facility] trying to get in touch with Summer [name] on referral for [facility] call back [phone]

## 2023-01-11 NOTE — Addendum Note (Signed)
Addended by: Dairl Ponder on: 01/11/2023 09:50 AM   Modules accepted: Orders

## 2023-01-14 ENCOUNTER — Ambulatory Visit (INDEPENDENT_AMBULATORY_CARE_PROVIDER_SITE_OTHER): Payer: Medicaid Other | Admitting: Allergy & Immunology

## 2023-01-14 ENCOUNTER — Other Ambulatory Visit: Payer: Self-pay

## 2023-01-14 ENCOUNTER — Encounter: Payer: Self-pay | Admitting: Allergy & Immunology

## 2023-01-14 ENCOUNTER — Telehealth: Payer: Self-pay

## 2023-01-14 ENCOUNTER — Telehealth: Payer: Self-pay | Admitting: Allergy & Immunology

## 2023-01-14 VITALS — BP 138/72 | HR 93 | Temp 98.5°F | Resp 20 | Ht 65.0 in | Wt 313.0 lb

## 2023-01-14 DIAGNOSIS — K219 Gastro-esophageal reflux disease without esophagitis: Secondary | ICD-10-CM | POA: Diagnosis not present

## 2023-01-14 DIAGNOSIS — T7840XD Allergy, unspecified, subsequent encounter: Secondary | ICD-10-CM

## 2023-01-14 DIAGNOSIS — J3089 Other allergic rhinitis: Secondary | ICD-10-CM

## 2023-01-14 DIAGNOSIS — J4540 Moderate persistent asthma, uncomplicated: Secondary | ICD-10-CM

## 2023-01-14 DIAGNOSIS — D8940 Mast cell activation, unspecified: Secondary | ICD-10-CM

## 2023-01-14 MED ORDER — CROMOLYN SODIUM 20 MG/2ML IN NEBU: 20.0000 mg | INHALATION_SOLUTION | Freq: Four times a day (QID) | RESPIRATORY_TRACT | 5 refills | Status: DC

## 2023-01-14 MED ORDER — BUDESONIDE-FORMOTEROL FUMARATE 80-4.5 MCG/ACT IN AERO: INHALATION_SPRAY | RESPIRATORY_TRACT | 5 refills | Status: DC

## 2023-01-14 MED ORDER — CROMOLYN SODIUM 100 MG/5ML PO CONC: 100.0000 mg | Freq: Three times a day (TID) | ORAL | 0 refills | Status: AC

## 2023-01-14 MED ORDER — NALTREXONE HCL (PAIN) 1.5 MG PO CAPS: 1.5000 mg | ORAL_CAPSULE | Freq: Every day | ORAL | 0 refills | Status: AC

## 2023-01-14 MED ORDER — CETIRIZINE HCL 10 MG PO TABS: 20.0000 mg | ORAL_TABLET | Freq: Two times a day (BID) | ORAL | 5 refills | Status: DC

## 2023-01-14 MED ORDER — NALTREXONE HCL (PAIN) 4.5 MG PO CAPS: ORAL_CAPSULE | ORAL | 0 refills | Status: DC

## 2023-01-14 MED ORDER — FAMOTIDINE 40 MG PO TABS: 40.0000 mg | ORAL_TABLET | Freq: Two times a day (BID) | ORAL | 5 refills | Status: DC

## 2023-01-14 NOTE — Patient Instructions (Addendum)
1. Perennial allergic rhinitis - indoor molds - Continue with: Zyrtec (cetirizine) 10mg  tablet twice daily and Pepcid (famotidine) 40mg  twice daily - You can use an extra dose of the antihistamine, if needed, for breakthrough symptoms.   2. Allergic reaction - likely mast cell activation syndrome - Continue with: Zyrtec (cetirizine) 20mg  tablet twice daily and Pepcid (famotidine) 40mg  twice daily - Continue taking: cromolyn 56mL four times daily (WE MAY HAVE TO HOLD THIS DEPENDING ON SHORTAGES) - Continue taking: hydroxyzine 25mg  to 50mg  at night - Start taking: naltrexone 2.25 mg nightly (half a capsule). - EpiPen is up to date.  3. Shortness of breath - Lung testing looks stable today.  - Daily controller medication(s): Symbicort 80/4.74mcg two puffs twice daily with spacer and cromolyn nebulizer twice daily   - Prior to physical activity: albuterol 2 puffs 10-15 minutes before physical activity. - Rescue medications: albuterol 4 puffs every 4-6 hours as needed and albuterol nebulizer one vial every 4-6 hours as needed - Asthma control goals:  * Full participation in all desired activities (may need albuterol before activity) * Albuterol use two time or less a week on average (not counting use with activity) * Cough interfering with sleep two time or less a month * Oral steroids no more than once a year * No hospitalizations  4. Hashimoto's thyroiditis  - Talk to Elmyra Ricks at the front desk about your referral.   5. Return in about 4 weeks (around 02/11/2023). OK to overbook or schedule with NP.    Please inform us of any Emergency Department visits, hospitalizations, or changes in symptoms. Call us before going to the ED for breathing or allergy symptoms since we might be able to fit you in for a sick visit. Feel free to contact us anytime with any questions, problems, or concerns.  It was a pleasure to see you again today!  Websites that have reliable patient information: 1. American  Academy of Asthma, Allergy, and Immunology: www.aaaai.org 2. Food Allergy Research and Education (FARE): foodallergy.org 3. Mothers of Asthmatics: http://www.asthmacommunitynetwork.org 4. American College of Allergy, Asthma, and Immunology: www.acaai.org   COVID-19 Vaccine Information can be found at: ShippingScam.co.uk For questions related to vaccine distribution or appointments, please email [email or web address] or call [phone].   We realize that you might be concerned about having an allergic reaction to the COVID19 vaccines. To help with that concern, WE ARE OFFERING THE COVID19 VACCINES IN OUR OFFICE! Ask the front desk for dates!     "Like" [place] on Facebook and Instagram for our latest updates!      A healthy democracy works best when [employer] participate! Make sure you are registered to vote! If you have moved or changed any of your contact information, you will need to get this updated before voting!  In some cases, you MAY be able to register to vote online: [email or web address]

## 2023-01-14 NOTE — Telephone Encounter (Signed)
Reason for cardiology consultation is a patient states ever since she has had COVID her heart has been having palpitations and it also races at times plus also she finds herself out of breath with activity that is the main reason we are sending her to cardiology  She is also been having some circulation issues in the feet but we are handling that separately thank you

## 2023-01-14 NOTE — Telephone Encounter (Signed)
Called and informed patient of medication being sent to [facility]. Patient verbalized understanding.

## 2023-01-14 NOTE — Telephone Encounter (Signed)
Ok I sent that in. Thanks!   Salvatore Marvel, MD [facility] [place]

## 2023-01-14 NOTE — Telephone Encounter (Signed)
Call received from [facility] cardiology about the referral they received for patient and wanted to verify if a cardiologist versus a vascular surgeon was needed because they are cardiology only not vascular surgeon. They are able to see the notes in the computer currently , please advise . Ph # [phone]

## 2023-01-14 NOTE — Progress Notes (Signed)
FOLLOW UP  Date of Service/Encounter:  01/14/23   Assessment:   Perennial allergic rhinitis (indoor molds)   Allergic reaction - unknown trigger (possible mast cell activation syndrome, although tryptase has never been elevated)   Chronic urticaria - despite daily antihistamines and montelukast   Burning of back with anxiety   Moderate persistent asthma, uncomplicated  Plan/Recommendations:   1. Perennial allergic rhinitis - indoor molds - Continue with: Zyrtec (cetirizine) 10mg  tablet twice daily and Pepcid (famotidine) 40mg  twice daily - You can use an extra dose of the antihistamine, if needed, for breakthrough symptoms.   2. Allergic reaction - likely mast cell activation syndrome - Continue with: Zyrtec (cetirizine) 20mg  tablet twice daily and Pepcid (famotidine) 40mg  twice daily - Continue taking: cromolyn 16mL four times daily (WE MAY HAVE TO HOLD THIS DEPENDING ON SHORTAGES) - Continue taking: hydroxyzine 25mg  to 50mg  at night - Start taking: naltrexone 2.25 mg nightly (half a capsule). - EpiPen is up to date.  3. Shortness of breath - Lung testing looks stable today.  - Daily controller medication(s): Symbicort 80/4.18mcg two puffs twice daily with spacer and cromolyn nebulizer twice daily   - Prior to physical activity: albuterol 2 puffs 10-15 minutes before physical activity. - Rescue medications: albuterol 4 puffs every 4-6 hours as needed and albuterol nebulizer one vial every 4-6 hours as needed - Asthma control goals:  * Full participation in all desired activities (may need albuterol before activity) * Albuterol use two time or less a week on average (not counting use with activity) * Cough interfering with sleep two time or less a month * Oral steroids no more than once a year * No hospitalizations  4. Hashimoto's thyroiditis  - Talk to Elmyra Ricks at the front desk about your referral.   5. Return in about 4 weeks (around 02/11/2023). OK to overbook or  schedule with NP.    Subjective:   Summer [name] is a 62 y.o. female presenting today for follow up of  Chief Complaint  Patient presents with   Follow-up   Urticaria    Itchy but no hives .    Summer [name] has a history of the following: Patient Active Problem List   Diagnosis Date Noted   Mast cell activation syndrome ([place]) 01/14/2023   Hepatic steatosis 09/20/2022   Hemorrhoids 09/20/2022   Orange stool 08/05/2022   Carpal tunnel syndrome 05/18/2022   Spinal stenosis 05/18/2022   Lower abdominal pain 04/28/2022   Constipation 04/06/2022   DOE (dyspnea on exertion) 01/18/2022   Emphysema lung ([place]) 01/15/2022   Hyperlipidemia 01/15/2022   Elevated hemoglobin ([facility]) 01/15/2022   Morbid obesity ([place]) 05/28/2021   LUQ pain 12/17/2020   Rectal bleeding 10/28/2020   Prediabetes 06/10/2014   Hypothyroidism 06/10/2014   Gastroesophageal reflux disease 04/01/2013   Fibromyalgia 03/22/2013    History obtained from: chart review and patient.  Summer [name] is a 62 y.o. female presenting for a follow up visit.  She was last seen in February 2024.  At that time, she was doing very well with her antihistamine and cromolyn.  She was interested in seeing a mast cell specialist at [facility] and we did put in the referral.  However, the referral was denied.  She had new onset shortness of breath which we thought was related to a COVID-19 infection.  We started her on Symbicort 80 mcg 2 puffs twice daily.  For her thyroiditis, we referred her to Endocrinology.  Since the last  visit, she has largely done well. She feels much better on this current combination of medications compared to before seeing me.   Asthma/Respiratory Symptom History: She is doing inhaled cromolyn twice daily. She feels that this is working. She did find that medication.  She remains on the Symbicort which seems to be helping. She is not using it as often as she was in the past. She is now getting back to her  baseline.  She has been very compliant with the Symbicort.  Allergic Reaction Symptom History: She is having a hard time finding the oral cromolyn. She cannot find it anywhere. There is a [facility] surgeon. She did feel like the cromolyn and the hydroxyzine in antihistamines have all helped collectively.  She feels much more normal with this combination.  She is frustrated to find out that her referral was declined.  However, she knows that the specialist is in high demand.  She is open to trying new treatments.  We have discussed Xolair in the past, but she was probably rightly worried that if she reacted to Xolair it would remain in her system for several weeks before the reaction stopped occurring. She is open to trying naltrexone, which I reported has been working fairly well for another patient of mine that is similar to her.  GERD Symptom History: Reflux is under good control at this point.  She does think that the Pepcid has been helping.  She is having surgery in April. They are going to do a scope to see why they she is having uterine bleeding.  She thinks this might be related to her mast cell disease.  She has a read and other groups and with a friend of hers that bleeding problems can present with mast cell disease.  Otherwise, there have been no changes to her past medical history, surgical history, family history, or social history.    Review of Systems  Constitutional: Negative.  Negative for chills, fever, malaise/fatigue and weight loss.  HENT:  Negative for congestion, ear discharge, ear pain and sinus pain.   Eyes:  Negative for pain, discharge and redness.  Respiratory:  Negative for cough, sputum production, shortness of breath and wheezing.   Cardiovascular: Negative.  Negative for chest pain and palpitations.  Gastrointestinal:  Negative for abdominal pain, constipation, diarrhea, heartburn, nausea and vomiting.  Skin:  Positive for itching and rash.  Neurological:   Negative for dizziness and headaches.  Endo/Heme/Allergies:  Positive for environmental allergies. Does not bruise/bleed easily.       Objective:   Blood pressure 138/72, pulse 93, temperature 98.5 F (36.9 C), resp. rate 20, height 5\' 5"  (1.651 m), weight (!) 313 lb (142 kg), last menstrual period 01/23/2014, SpO2 96 %. Body mass index is 52.09 kg/m.    Physical Exam Vitals reviewed.  Constitutional:      Appearance: She is well-developed and overweight.     Comments: Much more at ease. Talkative.   HENT:     Head: Normocephalic and atraumatic.     Right Ear: Tympanic membrane, ear canal and external ear normal. No drainage, swelling or tenderness. Tympanic membrane is not injected, scarred, erythematous, retracted or bulging.     Left Ear: Tympanic membrane, ear canal and external ear normal. No drainage, swelling or tenderness. Tympanic membrane is not injected, scarred, erythematous, retracted or bulging.     Nose: No nasal deformity, septal deviation, mucosal edema or rhinorrhea.     Right Turbinates: Enlarged, swollen and pale.  Left Turbinates: Enlarged, swollen and pale.     Right Sinus: No maxillary sinus tenderness or frontal sinus tenderness.     Left Sinus: No maxillary sinus tenderness or frontal sinus tenderness.     Comments: No polyps noted.     Mouth/Throat:     Mouth: Mucous membranes are not pale and not dry.     Pharynx: Uvula midline.  Eyes:     General:        Right eye: No discharge.        Left eye: No discharge.     Conjunctiva/sclera: Conjunctivae normal.     Right eye: Right conjunctiva is not injected. No chemosis.    Left eye: Left conjunctiva is not injected. No chemosis.    Pupils: Pupils are equal, round, and reactive to light.  Cardiovascular:     Rate and Rhythm: Normal rate and regular rhythm.     Heart sounds: Normal heart sounds.  Pulmonary:     Effort: Pulmonary effort is normal. No tachypnea, accessory muscle usage or respiratory  distress.     Breath sounds: Normal breath sounds. Decreased air movement present. No wheezing, rhonchi or rales.     Comments: Decreased air movement at the bases.  No wheezing or crackles. Chest:     Chest wall: No tenderness.  Abdominal:     Tenderness: There is no abdominal tenderness. There is no guarding or rebound.  Lymphadenopathy:     Head:     Right side of head: No submandibular, tonsillar or occipital adenopathy.     Left side of head: No submandibular, tonsillar or occipital adenopathy.     Cervical: No cervical adenopathy.  Skin:    General: Skin is warm.     Capillary Refill: Capillary refill takes less than 2 seconds.     Coloration: Skin is not pale.     Findings: No abrasion, erythema, petechiae or rash. Rash is not papular, urticarial or vesicular.     Comments: No flushing noted.  Neurological:     Mental Status: She is alert.  Psychiatric:        Behavior: Behavior is cooperative.      Diagnostic studies:    Spirometry: results abnormal (FEV1: 1.69/65%, FVC: 2.12/64%, FEV1/FVC: 80%).    Spirometry consistent with possible restrictive disease.  Overall, this is stable compared to her previous findings.    Allergy Studies: none       Salvatore Marvel, MD  [facility] [place]

## 2023-01-14 NOTE — Telephone Encounter (Signed)
Summer [name] called in and stated that her insurance doesn't cover Naltrexone even at 4.5mg , so Summer [name] is wanting to know since she is going to pay out of pocket anyway, can you just write the prescription for the 1.5mg  and send it to [facility]

## 2023-01-17 ENCOUNTER — Ambulatory Visit: Payer: Medicaid Other | Admitting: Urology

## 2023-01-17 NOTE — Telephone Encounter (Signed)
[facility] Cardiology notified and verbalized understanding.

## 2023-01-18 ENCOUNTER — Encounter (INDEPENDENT_AMBULATORY_CARE_PROVIDER_SITE_OTHER): Payer: Self-pay | Admitting: *Deleted

## 2023-01-18 ENCOUNTER — Other Ambulatory Visit (HOSPITAL_COMMUNITY): Payer: Self-pay

## 2023-01-24 ENCOUNTER — Other Ambulatory Visit (HOSPITAL_COMMUNITY)
Admission: RE | Admit: 2023-01-24 | Discharge: 2023-01-24 | Disposition: A | Discharge status: Home / Self Care | Payer: Medicaid Other | Source: Ambulatory Visit | Attending: Family Medicine | Admitting: Family Medicine

## 2023-01-24 ENCOUNTER — Ambulatory Visit (INDEPENDENT_AMBULATORY_CARE_PROVIDER_SITE_OTHER): Payer: Medicaid Other | Admitting: Family Medicine

## 2023-01-24 ENCOUNTER — Encounter: Payer: Self-pay | Admitting: Family Medicine

## 2023-01-24 VITALS — BP 167/96 | HR 76 | Ht 65.0 in | Wt 307.8 lb

## 2023-01-24 DIAGNOSIS — R079 Chest pain, unspecified: Secondary | ICD-10-CM | POA: Insufficient documentation

## 2023-01-24 DIAGNOSIS — R0609 Other forms of dyspnea: Secondary | ICD-10-CM

## 2023-01-24 DIAGNOSIS — U099 Post covid-19 condition, unspecified: Secondary | ICD-10-CM | POA: Diagnosis not present

## 2023-01-24 DIAGNOSIS — E039 Hypothyroidism, unspecified: Secondary | ICD-10-CM | POA: Diagnosis not present

## 2023-01-24 DIAGNOSIS — I878 Other specified disorders of veins: Secondary | ICD-10-CM | POA: Diagnosis not present

## 2023-01-24 DIAGNOSIS — G4730 Sleep apnea, unspecified: Secondary | ICD-10-CM

## 2023-01-24 DIAGNOSIS — R1013 Epigastric pain: Secondary | ICD-10-CM

## 2023-01-24 LAB — TROPONIN I (HIGH SENSITIVITY): Troponin I (High Sensitivity): 14 ng/L (ref <18)

## 2023-01-24 MED ORDER — PENICILLIN V POTASSIUM 500 MG PO TABS: 500.0000 mg | ORAL_TABLET | Freq: Four times a day (QID) | ORAL | 0 refills | Status: DC

## 2023-01-24 NOTE — Progress Notes (Signed)
   Subjective:    Patient ID: Summer [name], female    DOB: [birth year], 62 y.o.   MRN: [id number]  HPI Patient arrives today for post COVID syndrome follow up.Patient states she is she is feeling the same. Low energy Chest pain SOB fatigue Very nice patient Unfortunately she is abnormal with a lot of fatigue and tiredness and feeling rundown this is a consistent issue for her This is a pneumatically worse since having COVID She has post-COVID syndrome and has seen specialist She continues to have shortness of breath with minimal activity also relates tachycardia spells and unexplained chest pains she has had previous CTAs which did not show blockages but this was multiple years ago she was under the care of local cardiology but she is wanting to see a different specialist She also states she had a spell a few weeks ago of epigastric pain but it did get better on its own Review of Systems     Objective:   Physical Exam General-in no acute distress Eyes-no discharge Lungs-respiratory rate normal, CTA CV-no murmurs,RRR Extremities skin warm dry no edema Neuro grossly normal Behavior normal, alert   EKG was reviewed no acute changes     Assessment & Plan:  1. DOE (dyspnea on exertion) This is going back to post COVID symptoms are not getting much better at all.  Given her chest pain discomfort we will do EKG to look for any type of signs of a heart attack - EKG 12-Lead - Troponin I  2. Post covid-19 condition, unspecified Stable but not getting better.  3. Hypothyroidism, unspecified type Continue current measures  4. Chest pain, unspecified type Check troponin, troponin came back negative we are setting her up with cardiology this referral has already been sent to [facility] - Troponin I  5. Sleep apnea, unspecified type Referral to Dr.Athar patient with a lot of snoring feels tired and sleepy during the day does not feel well rested at night after sleeping - Ambulatory  referral to Neurology  6. Venous stasis Referral to vascular surgery this was done prior but apparently they never heard anything this is mainly because she has venous stasis in her feet but at times her toes are purple and stay purple concerned about circulation - Ambulatory referral to Vascular Surgery  7. Epigastric pain This occurred but it got better on its own no further workup  Follow-up 3 months

## 2023-01-26 ENCOUNTER — Ambulatory Visit (HOSPITAL_COMMUNITY): Admission: RE | Admit: 2023-01-26 | Payer: Medicaid Other | Source: Ambulatory Visit

## 2023-01-27 ENCOUNTER — Telehealth: Payer: Self-pay | Admitting: Family Medicine

## 2023-01-27 NOTE — Telephone Encounter (Signed)
Message sent to referral coordinator

## 2023-01-27 NOTE — Telephone Encounter (Signed)
Please see what you can do to get her reapproved to have the scan done I can pretty much guarantee the patient desires to have this completed She has a positive D-dimer, recent COVID, feels short of breath all the time worse with activity, I do not see any way around this issue with out getting the CAT scan completed so therefore please see what we can do to get it done, it appears that Anderson Malta is willing to do it later today, please see conversation below Conversation by Anderson Malta- I keep looking for who cancelled it.Marland KitchenMarland KitchenMarland KitchenI will keep looking. I came in at 1pm and all this was done before I got here. I can reach back out to your office tomorrow and I can get her in tomorrow, I will be here until 7pm.   Wed 4:58 PM I also do not see anything in notes from anyone at the office as to why this was cancelled. I will continue to investigate.  Wed 5:00 PM Appt notes stated that [facility] cancelled exam.   Wed 5:04 PM JC I spoke with this patient last night, I told her I would follow up today and call her back. I can add her back on the schedule today or as soon as she can come back once we figure out what happened.

## 2023-01-27 NOTE — Telephone Encounter (Signed)
Per referral coordinator: The scan was cancelled because the patient's insurance authorization had expired. Patient missed the originally scheduled appointment and when she rescheduled it it was outside the authorization window. Referral coordinator will work on getting a new precert and get it rescheduled. Patient notified.

## 2023-01-31 ENCOUNTER — Ambulatory Visit: Payer: Medicaid Other | Admitting: Physician Assistant

## 2023-02-02 DIAGNOSIS — I700 Atherosclerosis of aorta: Secondary | ICD-10-CM | POA: Insufficient documentation

## 2023-02-02 DIAGNOSIS — R0609 Other forms of dyspnea: Secondary | ICD-10-CM | POA: Diagnosis not present

## 2023-02-02 DIAGNOSIS — Z6841 Body Mass Index (BMI) 40.0 and over, adult: Secondary | ICD-10-CM | POA: Diagnosis not present

## 2023-02-02 DIAGNOSIS — J438 Other emphysema: Secondary | ICD-10-CM | POA: Diagnosis not present

## 2023-02-02 DIAGNOSIS — R000 Tachycardia, unspecified: Secondary | ICD-10-CM | POA: Diagnosis not present

## 2023-02-02 NOTE — Patient Instructions (Signed)
Summer [name]  02/02/2023     @PREFPERIOPPHARMACY @   Your procedure is scheduled on  02/08/2023.   Report to [facility] at  0600  A.M.   Call this number if you have problems the morning of surgery:  [phone]  If you experience any cold or flu symptoms such as cough, fever, chills, shortness of breath, etc. between now and your scheduled surgery, please notify us at the above number.   Remember:  Do not eat or drink after midnight.       Use your nebulizer and your inhaler before you come and bring your rescue inhaler with you.     Take these medicines the morning of surgery with A SIP OF WATER         xanax(if needed), zyrtec, pepcid, NP thyroid.     Do not wear jewelry, make-up or nail polish.  Do not wear lotions, powders, or perfumes, or deodorant.  Do not shave 48 hours prior to surgery.  Men may shave face and neck.  Do not bring valuables to the hospital.  [facility] is not responsible for any belongings or valuables.  Contacts, dentures or bridgework may not be worn into surgery.  Leave your suitcase in the car.  After surgery it may be brought to your room.  For patients admitted to the hospital, discharge time will be determined by your treatment team.  Patients discharged the day of surgery will not be allowed to drive home and must have someone with them for 24 hours.    Special instructions:   DO NOT smoke tobacco or vape for 24 hours before your procedure.  Please read over the following fact sheets that you were given. Coughing and Deep Breathing, Surgical Site Infection Prevention, Anesthesia Post-op Instructions, and Care and Recovery After Surgery      Dilation and Curettage or Vacuum Curettage, Care After The following information offers guidance on how to care for yourself after your procedure. Your doctor may also give you more specific instructions. If you have problems or questions, contact your doctor. What can I expect after  the procedure? After the procedure, it is common to have: Mild pain or cramps. Some bleeding or spotting from the vagina. These may last for up to 2 weeks. Follow these instructions at home: Medicines Take over-the-counter and prescription medicines only as told by your doctor. If told, take steps to prevent problems with pooping (constipation). You may need to: Drink enough fluid to keep your pee (urine) pale yellow. Take medicines. You will be told what medicines to take. Eat foods that are high in fiber. These include beans, whole grains, and fresh fruits and vegetables. Limit foods that are high in fat and sugar. These include fried or sweet foods. Ask your doctor if you should avoid driving or using machines while you are taking your medicine. Activity  If you were given a medicine to help you relax (sedative) during your procedure, it can affect you for many hours. Do not drive or use machinery until your doctor says that it is safe. Rest as told by your doctor. Get up to take short walks every 1-2 hours. Ask for help if you feel weak or unsteady. Do not lift anything that is heavier than 10 lb (4.5 kg), or the limit that you are told. Return to your normal activities when your doctor says that it is safe. Lifestyle For at least 2 weeks, or as long as  told by your doctor: Do not douche. Do not use tampons. Do not have sex. General instructions Do not take baths, swim, or use a hot tub. Ask your doctor if you may take showers. Do not smoke or use any products that contain nicotine or tobacco. These can delay healing. If you need help quitting, ask your doctor. Wear compression stockings as told by your doctor. It is up to you to get the results of your procedure. Ask how to get your results when they are ready. Keep all follow-up visits. Contact a doctor if: You have very bad cramps that get worse or do not get better with medicine. You have very bad pain in your belly  (abdomen). You cannot drink fluids without vomiting. You have pain in the area just above your thighs. You have fluid from your vagina that smells bad. You have a rash. Get help right away if: You are bleeding a lot from your vagina. This means soaking more than one sanitary pad in 1 hour, and this happens for 2 hours in a row. You have a fever that is above 100.83F (38C). Your belly feels very tender or hard. You have chest pain. You have trouble breathing. You feel dizzy or light-headed. You faint. You have pain in your neck or shoulder area. These symptoms may be an emergency. Get help right away. Call your local emergency services (911 in the [place] Do not wait to see if the symptoms will go away. Do not drive yourself to the hospital. Summary After your procedure, it is common to have pain or cramping. It is also common to have bleeding or spotting from your vagina. Rest as told. Get up to take short walks every 1-2 hours. Do not lift anything that is heavier than 10 lb (4.5 kg), or the limit that you are told. Get help right away if you have problems from the procedure. Ask your doctor what problems to watch for. This information is not intended to replace advice given to you by your health care provider. Make sure you discuss any questions you have with your health care provider. Document Revised: 10/08/2020 Document Reviewed: 10/08/2020 Elsevier Patient Education  [place] Anesthesia, Adult, Care After The following information offers guidance on how to care for yourself after your procedure. Your health care provider may also give you more specific instructions. If you have problems or questions, contact your health care provider. What can I expect after the procedure? After the procedure, it is common for people to: Have pain or discomfort at the IV site. Have nausea or vomiting. Have a sore throat or hoarseness. Have trouble concentrating. Feel cold or  chills. Feel weak, sleepy, or tired (fatigue). Have soreness and body aches. These can affect parts of the body that were not involved in surgery. Follow these instructions at home: For the time period you were told by your health care provider:  Rest. Do not participate in activities where you could fall or become injured. Do not drive or use machinery. Do not drink alcohol. Do not take sleeping pills or medicines that cause drowsiness. Do not make important decisions or sign legal documents. Do not take care of children on your own. General instructions Drink enough fluid to keep your urine pale yellow. If you have sleep apnea, surgery and certain medicines can increase your risk for breathing problems. Follow instructions from your health care provider about wearing your sleep device: Anytime you are sleeping, including during daytime naps.  While taking prescription pain medicines, sleeping medicines, or medicines that make you drowsy. Return to your normal activities as told by your health care provider. Ask your health care provider what activities are safe for you. Take over-the-counter and prescription medicines only as told by your health care provider. Do not use any products that contain nicotine or tobacco. These products include cigarettes, chewing tobacco, and vaping devices, such as e-cigarettes. These can delay incision healing after surgery. If you need help quitting, ask your health care provider. Contact a health care provider if: You have nausea or vomiting that does not get better with medicine. You vomit every time you eat or drink. You have pain that does not get better with medicine. You cannot urinate or have bloody urine. You develop a skin rash. You have a fever. Get help right away if: You have trouble breathing. You have chest pain. You vomit blood. These symptoms may be an emergency. Get help right away. Call 911. Do not wait to see if the symptoms will  go away. Do not drive yourself to the hospital. Summary After the procedure, it is common to have a sore throat, hoarseness, nausea, vomiting, or to feel weak, sleepy, or fatigue. For the time period you were told by your health care provider, do not drive or use machinery. Get help right away if you have difficulty breathing, have chest pain, or vomit blood. These symptoms may be an emergency. This information is not intended to replace advice given to you by your health care provider. Make sure you discuss any questions you have with your health care provider. Document Revised: 01/15/2022 Document Reviewed: 01/15/2022 Elsevier Patient Education  [place].

## 2023-02-03 ENCOUNTER — Telehealth: Payer: Self-pay | Admitting: Allergy & Immunology

## 2023-02-03 NOTE — Telephone Encounter (Signed)
Let's refer her to Dr. Jacelyn Pi at [facility]. I have another patient witrh Hashimoto's thyroiditis that likes her.   Salvatore Marvel, MD [facility] [place]

## 2023-02-04 ENCOUNTER — Encounter (HOSPITAL_COMMUNITY): Payer: Self-pay

## 2023-02-04 ENCOUNTER — Encounter (HOSPITAL_COMMUNITY)
Admission: RE | Admit: 2023-02-04 | Discharge: 2023-02-04 | Disposition: A | Discharge status: Home / Self Care | Payer: Medicaid Other | Source: Ambulatory Visit | Attending: Obstetrics & Gynecology | Admitting: Obstetrics & Gynecology

## 2023-02-04 DIAGNOSIS — N950 Postmenopausal bleeding: Secondary | ICD-10-CM | POA: Insufficient documentation

## 2023-02-04 DIAGNOSIS — Z01812 Encounter for preprocedural laboratory examination: Secondary | ICD-10-CM | POA: Diagnosis present

## 2023-02-04 DIAGNOSIS — Z01818 Encounter for other preprocedural examination: Secondary | ICD-10-CM

## 2023-02-04 HISTORY — DX: Mast cell activation, unspecified: D89.40

## 2023-02-04 HISTORY — DX: Chest pain, unspecified: R07.9

## 2023-02-04 HISTORY — DX: Other specified postprocedural states: Z98.890

## 2023-02-04 LAB — CBC
HCT: 46.3 % — ABNORMAL HIGH (ref 36.0–46.0)
Hemoglobin: 15 g/dL (ref 12.0–15.0)
MCH: 29.8 pg (ref 26.0–34.0)
MCHC: 32.4 g/dL (ref 30.0–36.0)
MCV: 92 fL (ref 80.0–100.0)
Platelets: 237 10*3/uL (ref 150–400)
RBC: 5.03 MIL/uL (ref 3.87–5.11)
RDW: 15.2 % (ref 11.5–15.5)
WBC: 6.9 10*3/uL (ref 4.0–10.5)
nRBC: 0 % (ref 0.0–0.2)

## 2023-02-04 LAB — BASIC METABOLIC PANEL
Anion gap: 8 (ref 5–15)
BUN: 15 mg/dL (ref 6–20)
CO2: 25 mmol/L (ref 22–32)
Calcium: 8.8 mg/dL — ABNORMAL LOW (ref 8.9–10.3)
Chloride: 105 mmol/L (ref 98–111)
Creatinine, Ser: 0.63 mg/dL (ref 0.44–1.00)
GFR, Estimated: >60 mL/min (ref >60)
Glucose, Bld: 116 mg/dL — ABNORMAL HIGH (ref 70–99)
Potassium: 3.6 mmol/L (ref 3.5–5.1)
Sodium: 138 mmol/L (ref 135–145)

## 2023-02-04 NOTE — Progress Notes (Signed)
Patient arrived for her PAT wearing a heart monitor.  For the last few weeks she has been having chest pains and shortness of breath.  Patient is being follow by [facility].  The monitor will removed on 02/08/22 and an ECHO will follow .  We will cancel her procedure for now and reschedule after cardiology clearance.

## 2023-02-07 ENCOUNTER — Encounter: Payer: Self-pay | Admitting: Obstetrics & Gynecology

## 2023-02-08 DIAGNOSIS — Z01818 Encounter for other preprocedural examination: Secondary | ICD-10-CM

## 2023-02-08 DIAGNOSIS — N950 Postmenopausal bleeding: Secondary | ICD-10-CM

## 2023-02-08 NOTE — Progress Notes (Addendum)
[facility] [address], [place] [address]   CLINIC:  Medical Oncology/Hematology  PCP:  Babs Sciara, MD [address] [place] [address] [phone]   REASON FOR VISIT:  Follow-up for erythrocytosis  PRIOR THERAPY: None  CURRENT THERAPY: Under workup  INTERVAL HISTORY:   Summer [name] 62 y.o. female returns for routine follow-up of erythrocytosis.  She was seen for initial consultation by Dr. Ellin Saba on 12/31/2022.  At today's visit, she reports feeling fair.  No recent hospitalizations, surgeries, or changes in baseline health status.  She denies any history of DVT or PE.  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  She also reports that her toes get purple when exposed to cold.  She reports occasional "pins-and-needles" paresthesias in the fingers and toes.  She has intermittent dizziness and blurry vision.  No B symptoms.  She is a former smoker, quit 15+ years ago.  She has been referred for sleep study due to excessive daytime sleepiness and waking up at night gasping for breath.  She takes Lasix for the past 2 years.  She has gas heating in her apartment, but does not know if she has a carbon monoxide monitor.  She reports history of asthma and is being seen by cardiologist for intermittent chest pain and shortness of breath.  She does not have any family history of MPN.    She has 50% energy and 75% appetite. She endorses that she is maintaining a stable weight.   ASSESSMENT & PLAN:  1.  Erythrocytosis - Patient seen at the request of Dr. Gerda Diss. - She had elevated hemoglobin, RBC count and HCT consistently since December 2018. - CT renal study (12/25/2022): Normal spleen and liver.  Right renal cyst noted. - Uses Symbicort inhaler for 6 months and Lasix for 2 years. - Reports history of asthma.  Is being worked up by cardiology for shortness of breath and  intermittent chest pain. - Reports gas heat in her apartment.  No carbon monoxide monitor. - No family history of MPN. - Former smoker (quit 15+ years ago) - Sleep study pending due to daytime somnolence and waking at night gasping for breath  - Reports occasional aquagenic pruritus as well as general pruritus (mast cell activation disorder).  Toes turn purple and cold.  Paresthesias in fingers and toes. - Hematology workup (12/31/2022): Negative for JAK2, CALR, MPL, Exons 12-15 mutations. Normal erythropoietin 13.3. -Most recent CBC (02/04/2023): Hgb 15.0/HCT 46.3% - DIFFERENTIAL DIAGNOSIS favors erythrocytosis secondary to suspected sleep apnea, possibly worsened by hemoconcentration from Lasix.  No evidence of MPN or clonal erythrocytosis at this time. - PLAN: Continue follow-up with PCP for possible OSA.  Discussed that treatment of secondary erythrocytosis is aimed at the underlying cause and that diagnosis of OSA and use of CPAP would likely improve her blood counts. - Repeat CBC with office visit in 6 months.  We will also check carbon monoxide levels.  2.  Bleeding episodes with easy bruising - Patient reports easy bruising and bleeding - She reports "bleeding episodes" involving vaginal, urethral and rectal bleeding lasting between 5 days and 2 weeks about 2 times per year for the last 3 years. - She had tonsillectomy at age 38 and wisdom teeth removed after high school with no extra bleeding complications. - Follows with GYN (Dr. Charlotta Newton) for abnormal postmenopausal vaginal bleeding, with plans for upcoming hysteroscopy with D&C - Follows  with urology for hematuria  - Follows with GI for rectal bleeding  - Hematology workup (12/31/2022): Normal PT/INR and APTT Normal fibrinogen Von Willebrand panel negative. - PLAN: No evidence of primary hematologic bleeding disorder.  Continue follow-up with specialists above.  3.  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 - Quit smoking 15 years ago.  Smoked 1 pack/day for 20 years. - No family history of polycythemia vera.  Biological mother died of small cell lung cancer.  Patient was adopted and her knowledge of biological family is limited.   PLAN SUMMARY: >> Labs in 6 months = CBC/D, carbon monoxide >> OFFICE visit in 6 months (1 week after labs)     REVIEW OF SYSTEMS:   Review of Systems  Constitutional:  Positive for fatigue. Negative for appetite change, chills, diaphoresis, fever and unexpected weight change.  HENT:   Negative for lump/mass and nosebleeds.   Eyes:  Negative for eye problems.  Respiratory:  Positive for cough and shortness of breath (at times). Negative for hemoptysis.   Cardiovascular:  Positive for chest pain (none today) and palpitations. Negative for leg swelling.  Gastrointestinal:  Positive for nausea and vomiting. Negative for abdominal pain, blood in stool, constipation and diarrhea.  Genitourinary:  Negative for hematuria.   Musculoskeletal:  Positive for arthralgias.  Skin: Negative.   Neurological:  Positive for dizziness, headaches and numbness. Negative for light-headedness.  Hematological:  Bruises/bleeds easily.  Psychiatric/Behavioral:  Positive for sleep disturbance. The patient is nervous/anxious.      PHYSICAL EXAM:  ECOG PERFORMANCE STATUS: 1 - Symptomatic but completely ambulatory  There were no vitals filed for this visit. There were no vitals filed for this visit. Physical Exam Constitutional:      Appearance: Normal appearance. She is morbidly obese.  Cardiovascular:     Heart sounds: Normal heart sounds.  Pulmonary:     Breath sounds: Normal breath sounds.  Neurological:     General: No focal deficit present.     Mental Status: Mental status is at baseline.  Psychiatric:         Behavior: Behavior normal. Behavior is cooperative.    PAST MEDICAL/SURGICAL HISTORY:  Past Medical History:  Diagnosis Date   Arthritis    Bell's palsy    Chest pain, unspecified    Dysrhythmia, cardiac    Fibromyalgia    Hashimoto's disease    History of CT scan 06/2017   "coronary CT scan on 06/01/2017, this did not find any cardiac calcifications. Coronary calcium score of zero."   History of Holter monitoring    Hypertension    Mast cell activation syndrome    Thyroid disease    Urticaria    Past Surgical History:  Procedure Laterality Date   BIOPSY  11/24/2021   Procedure: BIOPSY;  Surgeon: Dolores Frame, MD;  [facility]: [facility];  Service: Gastroenterology;;   COLONOSCOPY N/A 01/23/2014   Procedure: COLONOSCOPY;  Surgeon: Malissa Hippo, MD;  [facility]: [facility];  Service: Endoscopy;  Laterality: N/A;  200   COLONOSCOPY WITH PROPOFOL N/A 11/24/2021   Procedure: COLONOSCOPY WITH PROPOFOL;  Surgeon: Dolores Frame, MD;  Location: [facility];  Service: Gastroenterology;  Laterality: N/A;  205   ESOPHAGOGASTRODUODENOSCOPY (EGD) WITH PROPOFOL N/A 11/24/2021   Procedure: ESOPHAGOGASTRODUODENOSCOPY (EGD) WITH PROPOFOL;  Surgeon: Marguerita Merles,  Reuel Boom, MD;  Location: [facility];  Service: Gastroenterology;  Laterality: N/A;   POLYPECTOMY  11/24/2021   Procedure: POLYPECTOMY;  Surgeon: Marguerita Merles, Reuel Boom, MD;  Location: [facility];  Service: Gastroenterology;;   TONSILLECTOMY      SOCIAL HISTORY:  Social History   Socioeconomic History   Marital status: Married    Spouse name: Not on file   Number of children: Not on file   Years of education: Not on file   Highest education level: Not on file  Occupational History   Not on file  Tobacco Use   Smoking status: Former    Packs/day: 1.00    Years: 20.00    Additional pack years: 0.00    Total pack years: 20.00    Types: Cigarettes    Quit date: 11/08/2003    Years  since quitting: 19.2   Smokeless tobacco: Never   Tobacco comments:    quit smoking 10 yrs   Vaping Use   Vaping Use: Never used  Substance and Sexual Activity   Alcohol use: No   Drug use: No   Sexual activity: Yes    Birth control/protection: Post-menopausal  Other Topics Concern   Not on file  Social History Narrative   Not on file   Social Determinants of Health   Financial Resource Strain: Not on file  Food Insecurity: Not on file  Transportation Needs: Not on file  Physical Activity: Not on file  Stress: Not on file  Social Connections: Not on file  Intimate Partner Violence: Not on file    FAMILY HISTORY:  Family History  Adopted: Yes  Problem Relation Age of Onset   Cancer Mother        lung   Other Father    Other Daughter        Lyme's disease    CURRENT MEDICATIONS:  Outpatient Encounter Medications as of 02/09/2023  Medication Sig Note   albuterol (PROVENTIL) (2.5 MG/3ML) 0.083% nebulizer solution Take 3 mLs (2.5 mg total) by nebulization every 6 (six) hours as needed for wheezing or shortness of breath.    albuterol (VENTOLIN HFA) 108 (90 Base) MCG/ACT inhaler Inhale 2 puffs into the lungs every 6 (six) hours as needed for wheezing or shortness of breath.    ALPRAZolam (XANAX) 0.5 MG tablet Take 1 tablet (0.5 mg total) by mouth 2 (two) times daily as needed. for anxiety (Patient taking differently: Take 0.5 mg by mouth 2 (two) times daily as needed for anxiety.)    Ascorbic Acid (VITAMIN C) 500 MG CAPS Take 500 mg by mouth daily.    B Complex-C (SUPER B COMPLEX PO) Take 1 tablet by mouth daily at 6 (six) AM.    budesonide-formoterol (SYMBICORT) 80-4.5 MCG/ACT inhaler Inhale 2 puffs twice a day with spacer to help prevent cough and wheeze    cetirizine (ZYRTEC) 10 MG tablet Take 2 tablets (20 mg total) by mouth 2 (two) times daily.    chlorhexidine (PERIDEX) 0.12 % solution Use as directed 10 mLs in the mouth or throat 2 (two) times daily. 01/31/2023: Has not  started   Cholecalciferol (VITAMIN D-3) 5000 UNITS TABS Take 5,000 Units by mouth daily.    cromolyn (GASTROCROM) 100 MG/5ML solution Take 5 mLs (100 mg total) by mouth 4 (four) times daily -  before meals and at bedtime.    cromolyn (INTAL) 20 MG/2ML nebulizer solution Take 2 mLs (20 mg total) by nebulization in the morning, at noon, in the evening,  and at bedtime. (Patient taking differently: Take 20 mg by nebulization 3 (three) times daily as needed (Asthma mastel activation syndrome).)    EPINEPHrine 0.3 mg/0.3 mL IJ SOAJ injection Inject 0.3 mg into the muscle as needed for anaphylaxis.    famotidine (PEPCID) 40 MG tablet Take 1 tablet (40 mg total) by mouth 2 (two) times daily. (Patient taking differently: Take 20 mg by mouth 2 (two) times daily.)    furosemide (LASIX) 20 MG tablet 1  to 2 qam prn edema (Patient taking differently: Take 40 mg by mouth in the morning.)    Magnesium 200 MG TABS Take 200 mg by mouth 2 (two) times daily.    Naltrexone HCl, Pain, 1.5 MG CAPS Take 1.5 mg by mouth at bedtime. 01/31/2023: Has not started   Omega-3 Fatty Acids (FISH OIL) 1200 MG CAPS Take 1,200 mg by mouth daily.    potassium chloride SA (KLOR-CON M) 20 MEQ tablet 1 to 2 qam with lasix (Patient taking differently: Take 20 mEq by mouth daily.)    Probiotic Product (PROBIOTIC PO) Take 100 mg by mouth daily.    Quercetin 500 MG CAPS Take 500 mg by mouth daily.    RESTASIS 0.05 % ophthalmic emulsion Place 1 drop into both eyes daily.    rosuvastatin (CRESTOR) 5 MG tablet Take 1 tablet (5 mg total) by mouth daily.    thyroid (NP THYROID) 60 MG tablet TAKE 1 TABLET BY MOUTH ONCE DAILY BEFORE BREAKFAST    No facility-administered encounter medications on file as of 02/09/2023.    ALLERGIES:  Allergies  Allergen Reactions   Bee Venom Anaphylaxis and Hives   Molds & Smuts Shortness Of Breath   Shellfish Allergy Anaphylaxis and Hives   Avelox [Moxifloxacin Hcl In Nacl] Other (See Comments)    "heart beats  weird"   Moxifloxacin Other (See Comments)    "heart beats weird"   Ciprofloxacin Other (See Comments)    "heart beats weird"   Biaxin [Clarithromycin]     Nausea and heart palpitations   Tape Rash    LABORATORY DATA:  I have reviewed the labs as listed.  CBC    Component Value Date/Time   WBC 6.9 02/04/2023 0916   RBC 5.03 02/04/2023 0916   HGB 15.0 02/04/2023 0916   HGB 16.5 (H) 12/23/2021 1612   HCT 46.3 (H) 02/04/2023 0916   HCT 49.4 (H) 12/23/2021 1612   PLT 237 02/04/2023 0916   PLT 277 10/30/2020 1521   MCV 92.0 02/04/2023 0916   MCV 90 12/23/2021 1612   MCH 29.8 02/04/2023 0916   MCHC 32.4 02/04/2023 0916   RDW 15.2 02/04/2023 0916   RDW 14.0 12/23/2021 1612   LYMPHSABS 3.0 12/25/2022 1731   LYMPHSABS 2.8 12/23/2021 1612   MONOABS 0.6 12/25/2022 1731   EOSABS 0.2 12/25/2022 1731   EOSABS 0.2 12/23/2021 1612   BASOSABS 0.1 12/25/2022 1731   BASOSABS 0.1 12/23/2021 1612      Latest Ref Rng & Units 02/04/2023    9:16 AM 12/25/2022    5:31 PM 08/13/2022   11:48 AM  CMP  Glucose 70 - 99 mg/dL 161  096  045   BUN 6 - 20 mg/dL 15  17  15    Creatinine 0.44 - 1.00 mg/dL 4.09  8.11  9.14   Sodium 135 - 145 mmol/L 138  136  141   Potassium 3.5 - 5.1 mmol/L 3.6  3.5  3.8   Chloride 98 - 111 mmol/L 105  103  105   CO2 22 - 32 mmol/L 25  24  28    Calcium 8.9 - 10.3 mg/dL 8.8  8.9  9.1     DIAGNOSTIC IMAGING:  I have independently reviewed the relevant imaging and discussed with the patient.   WRAP UP:  All questions were answered. The patient knows to call the clinic with any problems, questions or concerns.  Medical decision making: Moderate  Time spent on visit: I spent 20 minutes counseling the patient face to face. The total time spent in the appointment was 30 minutes and more than 50% was on counseling.  Carnella Guadalajara, PA-C  02/09/2023 3:09 PM

## 2023-02-09 ENCOUNTER — Inpatient Hospital Stay: Payer: Medicaid Other | Attending: Hematology | Admitting: Physician Assistant

## 2023-02-09 ENCOUNTER — Other Ambulatory Visit: Payer: Self-pay

## 2023-02-09 VITALS — BP 157/97 | HR 88 | Temp 97.6°F | Resp 18 | Ht 65.0 in | Wt 307.7 lb

## 2023-02-09 DIAGNOSIS — M1990 Unspecified osteoarthritis, unspecified site: Secondary | ICD-10-CM | POA: Diagnosis not present

## 2023-02-09 DIAGNOSIS — D582 Other hemoglobinopathies: Secondary | ICD-10-CM

## 2023-02-09 DIAGNOSIS — N281 Cyst of kidney, acquired: Secondary | ICD-10-CM | POA: Diagnosis not present

## 2023-02-09 DIAGNOSIS — Z780 Asymptomatic menopausal state: Secondary | ICD-10-CM | POA: Diagnosis not present

## 2023-02-09 DIAGNOSIS — R319 Hematuria, unspecified: Secondary | ICD-10-CM | POA: Diagnosis not present

## 2023-02-09 DIAGNOSIS — J45909 Unspecified asthma, uncomplicated: Secondary | ICD-10-CM | POA: Diagnosis not present

## 2023-02-09 DIAGNOSIS — K625 Hemorrhage of anus and rectum: Secondary | ICD-10-CM | POA: Insufficient documentation

## 2023-02-09 DIAGNOSIS — N939 Abnormal uterine and vaginal bleeding, unspecified: Secondary | ICD-10-CM | POA: Insufficient documentation

## 2023-02-09 DIAGNOSIS — E039 Hypothyroidism, unspecified: Secondary | ICD-10-CM | POA: Insufficient documentation

## 2023-02-09 DIAGNOSIS — Z801 Family history of malignant neoplasm of trachea, bronchus and lung: Secondary | ICD-10-CM | POA: Insufficient documentation

## 2023-02-09 DIAGNOSIS — Z79899 Other long term (current) drug therapy: Secondary | ICD-10-CM | POA: Diagnosis not present

## 2023-02-09 DIAGNOSIS — N368 Other specified disorders of urethra: Secondary | ICD-10-CM | POA: Insufficient documentation

## 2023-02-09 DIAGNOSIS — I10 Essential (primary) hypertension: Secondary | ICD-10-CM | POA: Diagnosis not present

## 2023-02-09 DIAGNOSIS — D751 Secondary polycythemia: Secondary | ICD-10-CM | POA: Diagnosis present

## 2023-02-09 DIAGNOSIS — M797 Fibromyalgia: Secondary | ICD-10-CM | POA: Insufficient documentation

## 2023-02-09 DIAGNOSIS — Z87891 Personal history of nicotine dependence: Secondary | ICD-10-CM | POA: Diagnosis not present

## 2023-02-09 DIAGNOSIS — R233 Spontaneous ecchymoses: Secondary | ICD-10-CM | POA: Insufficient documentation

## 2023-02-09 DIAGNOSIS — F419 Anxiety disorder, unspecified: Secondary | ICD-10-CM | POA: Insufficient documentation

## 2023-02-09 DIAGNOSIS — D8940 Mast cell activation, unspecified: Secondary | ICD-10-CM | POA: Diagnosis not present

## 2023-02-09 NOTE — Patient Instructions (Signed)
[facility] at [facility] **VISIT SUMMARY & IMPORTANT INSTRUCTIONS **   You were seen today by Rojelio Brenner PA-C for your follow-up visit.    ELEVATED RED BLOOD CELLS Your labs did not show any evidence of genetic mutation or cancer as the cause of your high red blood cells. Your elevated red blood cells are most likely related to underlying sleep apnea.  Make sure that you continue to follow-up with sleep medicine specialist and primary care doctor for sleep study.  If you are recommended to start using CPAP, use this as prescribed - this should help your blood counts to improve over time.  EASY BRUISING & BLEEDING Your labs did not show any evidence of bleeding disorder. Continue to follow-up with other specialists (gynecology, gastroenterology, and urology) regarding other potential causes of bleeding.  LABS: Return in 6 months for labs  FOLLOW-UP APPOINTMENT: Office visit in 6 months (1 week after labs)  ** Thank you for trusting me with your healthcare!  I strive to provide all of my patients with quality care at each visit.  If you receive a survey for this visit, I would be so grateful to you for taking the time to provide feedback.  Thank you in advance!  ~ Gustaf Mccarter                   Dr. Doreatha Massed   &   Rojelio Brenner, PA-C   - - - - - - - - - - - - - - - - - -    Thank you for choosing [facility] at [facility] to provide your oncology and hematology care.  To afford each patient quality time with our provider, please arrive at least 15 minutes before your scheduled appointment time.   If you have a lab appointment with the Cancer Center please come in thru the Main Entrance and check in at the main information desk.  You need to re-schedule your appointment should you arrive 10 or more minutes late.  We strive to give you quality time with our providers, and arriving late affects you and other patients whose  appointments are after yours.  Also, if you no show three or more times for appointments you may be dismissed from the clinic at the providers discretion.     Again, thank you for choosing [facility].  Our hope is that these requests will decrease the amount of time that you wait before being seen by our physicians.       _____________________________________________________________  Should you have questions after your visit to [facility], please contact our office at [phone] and follow the prompts.  Our office hours are 8:00 a.m. and 4:30 p.m. Monday - Friday.  Please note that voicemails left after 4:00 p.m. may not be returned until the following business day.  We are closed weekends and major holidays.  You do have access to a nurse 24-7, just call the main number to the clinic [phone] and do not press any options, hold on the line and a nurse will answer the phone.    For prescription refill requests, have your pharmacy contact our office and allow 72 hours.

## 2023-02-10 ENCOUNTER — Other Ambulatory Visit: Payer: Self-pay

## 2023-02-10 DIAGNOSIS — D582 Other hemoglobinopathies: Secondary | ICD-10-CM

## 2023-02-10 NOTE — Telephone Encounter (Signed)
[facility] called and they do not take the patients insurance, which is [facility] Medicaid.

## 2023-02-14 ENCOUNTER — Ambulatory Visit: Payer: Medicaid Other | Admitting: Urology

## 2023-02-14 DIAGNOSIS — R000 Tachycardia, unspecified: Secondary | ICD-10-CM | POA: Diagnosis not present

## 2023-02-16 ENCOUNTER — Ambulatory Visit: Payer: Medicaid Other | Admitting: Family Medicine

## 2023-02-16 DIAGNOSIS — R0609 Other forms of dyspnea: Secondary | ICD-10-CM | POA: Diagnosis not present

## 2023-02-16 DIAGNOSIS — R000 Tachycardia, unspecified: Secondary | ICD-10-CM | POA: Diagnosis not present

## 2023-02-17 ENCOUNTER — Other Ambulatory Visit: Payer: Medicaid Other | Admitting: Licensed Clinical Social Worker

## 2023-02-18 ENCOUNTER — Telehealth: Payer: Self-pay

## 2023-02-18 ENCOUNTER — Other Ambulatory Visit: Payer: Self-pay

## 2023-02-18 ENCOUNTER — Encounter: Payer: Self-pay | Admitting: Allergy & Immunology

## 2023-02-18 ENCOUNTER — Ambulatory Visit (INDEPENDENT_AMBULATORY_CARE_PROVIDER_SITE_OTHER): Payer: Medicaid Other | Admitting: Allergy & Immunology

## 2023-02-18 VITALS — BP 134/86 | HR 82 | Temp 98.7°F | Resp 16 | Ht 65.0 in | Wt 312.0 lb

## 2023-02-18 DIAGNOSIS — R5383 Other fatigue: Secondary | ICD-10-CM | POA: Diagnosis not present

## 2023-02-18 DIAGNOSIS — J3089 Other allergic rhinitis: Secondary | ICD-10-CM

## 2023-02-18 DIAGNOSIS — E063 Autoimmune thyroiditis: Secondary | ICD-10-CM

## 2023-02-18 DIAGNOSIS — T7840XD Allergy, unspecified, subsequent encounter: Secondary | ICD-10-CM | POA: Diagnosis not present

## 2023-02-18 DIAGNOSIS — K219 Gastro-esophageal reflux disease without esophagitis: Secondary | ICD-10-CM | POA: Diagnosis not present

## 2023-02-18 DIAGNOSIS — D8940 Mast cell activation, unspecified: Secondary | ICD-10-CM | POA: Diagnosis not present

## 2023-02-18 DIAGNOSIS — J4540 Moderate persistent asthma, uncomplicated: Secondary | ICD-10-CM

## 2023-02-18 MED ORDER — NALTREXONE HCL (PAIN) 4.5 MG PO CAPS: ORAL_CAPSULE | ORAL | 0 refills | Status: AC

## 2023-02-18 NOTE — Telephone Encounter (Signed)
-----   Message from Alfonse Spruce, MD sent at 02/18/2023  1:07 PM EDT ----- Can someone call the pharmacy to get details on her naltrexone prescription? She mentioned that there was a 90 day option, but a new prescription was needed. Thanks!

## 2023-02-18 NOTE — Telephone Encounter (Signed)
Since the 4.5 mg is covered, we are going to do a 90 day supply of this.  I want her to take half a capsule nightly (she will likely have to take apart the gel capsule and pour out the contents and estimate half of it). She can do that nightly for 2 weeks and then ramp up to one capsule nightly thereafter.  Malachi Bonds, MD [facility]

## 2023-02-18 NOTE — Telephone Encounter (Signed)
Spoke with patient, informed her of Dr. Gallagher's recommendation. Patient verbalized understanding.  °

## 2023-02-18 NOTE — Patient Instructions (Addendum)
1. Perennial allergic rhinitis - indoor molds - Continue with: Zyrtec (cetirizine)  tablet twice daily and Pepcid (famotidine)  twice daily - You can use an extra dose of the antihistamine, if needed, for breakthrough symptoms.   2. Allergic reaction - likely mast cell activation syndrome - Continue with: Zyrtec (cetirizine)  tablet twice daily and Pepcid (famotidine)  twice daily - Continue taking: cromolyn 5mL four times daily - Continue taking: hydroxyzine  to  at night - Start taking: naltrexone nightly (but we will call the pharmacy to clarify this) - EpiPen is up to date.  3. Shortness of breath - Lung testing looks a little bit better.  - I agree with the idea of a sleep study.   - I will send my note to Dr. Vassie Loll. - We have a lot of patients that we manage together.  - Daily controller medication(s): Symbicort 80/4.33mcg two puffs twice daily with spacer and cromolyn nebulizer twice daily   - Prior to physical activity: albuterol 2 puffs 10-15 minutes before physical activity. - Rescue medications: albuterol 4 puffs every 4-6 hours as needed and albuterol nebulizer one vial every 4-6 hours as needed - Asthma control goals:  * Full participation in all desired activities (may need albuterol before activity) * Albuterol use two time or less a week on average (not counting use with activity) * Cough interfering with sleep two time or less a month * Oral steroids no more than once a year * No hospitalizations  4. Hashimoto's thyroiditis  - We will refer you to an endocrinologist at either [facility] or [place].   5. Return in about 2 months (around 04/20/2023). OK to overbook or schedule with NP.    Please inform us of any Emergency Department visits, hospitalizations, or changes in symptoms. Call us before going to the ED for breathing or allergy symptoms since we might be able to fit you in for a sick visit. Feel free to contact us anytime with any  questions, problems, or concerns.  It was a pleasure to see you again today!  Websites that have reliable patient information: 1. American Academy of Asthma, Allergy, and Immunology: www.aaaai.org 2. Food Allergy Research and Education (FARE): foodallergy.org 3. Mothers of Asthmatics: http://www.asthmacommunitynetwork.org 4. American College of Allergy, Asthma, and Immunology: www.acaai.org   COVID-19 Vaccine Information can be found at: [email or web address] For questions related to vaccine distribution or appointments, please email [email or web address] or call [phone].   We realize that you might be concerned about having an allergic reaction to the COVID19 vaccines. To help with that concern, WE ARE OFFERING THE COVID19 VACCINES IN OUR OFFICE! Ask the front desk for dates!     "Like" [place] on Facebook and Instagram for our latest updates!      A healthy democracy works best when [employer] participate! Make sure you are registered to vote! If you have moved or changed any of your contact information, you will need to get this updated before voting!  In some cases, you MAY be able to register to vote online: [email or web address]

## 2023-02-18 NOTE — Progress Notes (Signed)
FOLLOW UP  Date of Service/Encounter:  02/18/23   Assessment:   Perennial allergic rhinitis (indoor molds)   Allergic reaction - unknown trigger (possible mast cell activation syndrome, although tryptase has never been elevated)   Chronic urticaria - improved with daily antihistamines and montelukast    Moderate persistent asthma, uncomplicated - with slowly improving spirometry tracings  Anxiety - has Xanas as needed  Fatigue  Plan/Recommendations:   1. Perennial allergic rhinitis - indoor molds - Continue with: Zyrtec (cetirizine) 10mg  tablet twice daily and Pepcid (famotidine) 40mg  twice daily - You can use an extra dose of the antihistamine, if needed, for breakthrough symptoms.   2. Allergic reaction - likely mast cell activation syndrome - Continue with: Zyrtec (cetirizine) 20mg  tablet twice daily and Pepcid (famotidine) 40mg  twice daily - Continue taking: cromolyn 5mL four times daily - Continue taking: hydroxyzine 25mg  to 50mg  at night - Start taking: naltrexone nightly (but we will call the pharmacy to clarify this) - EpiPen is up to date.  3. Shortness of breath - Lung testing looks a little bit better.  - I agree with the idea of a sleep study.   - I will send my note to Dr. Vassie Loll. - We have a lot of patients that we manage together.  - Daily controller medication(s): Symbicort 80/4.52mcg two puffs twice daily with spacer and cromolyn nebulizer twice daily   - Prior to physical activity: albuterol 2 puffs 10-15 minutes before physical activity. - Rescue medications: albuterol 4 puffs every 4-6 hours as needed and albuterol nebulizer one vial every 4-6 hours as needed - Asthma control goals:  * Full participation in all desired activities (may need albuterol before activity) * Albuterol use two time or less a week on average (not counting use with activity) * Cough interfering with sleep two time or less a month * Oral steroids no more than once a year * No  hospitalizations  4. Hashimoto's thyroiditis  - We will refer you to an endocrinologist at either [facility] or [place].   5. Return in about 2 months (around 04/20/2023). OK to overbook or schedule with NP.   Subjective:   Summer [name] is a 62 y.o. female presenting today for follow up of  Chief Complaint  Patient presents with   Asthma    More shortness of breath than usual. At night her breathing at night when she exhales is delayed and started to gasp for air with shaking. New lung Doctor has a sleep study schedule in May 2024.     Summer [name] has a history of the following: Patient Active Problem List   Diagnosis Date Noted   Mast cell activation syndrome 01/14/2023   Hepatic steatosis 09/20/2022   Hemorrhoids 09/20/2022   Orange stool 08/05/2022   Carpal tunnel syndrome 05/18/2022   Spinal stenosis 05/18/2022   Lower abdominal pain 04/28/2022   Constipation 04/06/2022   DOE (dyspnea on exertion) 01/18/2022   Emphysema lung 01/15/2022   Hyperlipidemia 01/15/2022   Elevated hemoglobin 01/15/2022   Morbid obesity 05/28/2021   LUQ pain 12/17/2020   Rectal bleeding 10/28/2020   Prediabetes 06/10/2014   Hypothyroidism 06/10/2014   Gastroesophageal reflux disease 04/01/2013   Fibromyalgia 03/22/2013    History obtained from: chart review and patient.  Summer [name] is a 62 y.o. female presenting for a follow up visit.  She was last seen in March 2024.  At that time, we continue with her Zyrtec and Pepcid for the perennial allergic  rhinitis.  Her lung testing looks stable.  We continue with Symbicort 80 mcg 2 puffs twice daily.  We also added cromolyn nebulizer given her possible MCAS diagnosis.  We also continued with oral cromolyn 5 mL 4 times daily in combination with hydroxyzine 25 to 50 mg at night.  We also had a discussion about starting low-dose naltrexone, so we sent this in since there is some data to show that it helps with MCAS.  She had actually been on  naltrexone in the past for her Hashimoto's, so she knew that she tolerated it just fine.  We did try to refer her to [facility] for management of her Hashimoto's, but unfortunately they do not take her [employer].  Since last visit, she has been about the same.  Asthma/Respiratory Symptom History: She is using her albuterol 2-3 times per week. She has been having a lot of doctor appointments which requires her to walk more.  Therefore, she has been using it before her appointments to allow her to have the stamina to tolerate her appointments.  It helps her breathe better. She is using the cromolyn four times daily and this is helping with her congestion and her breathing. If she takes it before bed, it does help. She is puzzled with the breathing issues. She did change her Cardiologist and she had an echocardiogram recently.   Her Cardiologist thought that the Symbicort might be causing the problems with her tachycardia. But stopping this did not help at all.  So she restarted her Symbicort with no worsening of her symptoms.  He also felt that this might be related to her Hashimoto's thyroiditis, but he did not think this was cardiac related.  She sees Dr. Rayetta Pigg Assar she reports that she got along very well..  Echocardiogram from April 17th:    1. The left ventricle is normal in size with normal wall thickness.    2. The left ventricular systolic function is normal, LVEF is visually estimated at 60-65%.    3. There is grade I diastolic dysfunction (impaired relaxation).    4. There is mild mitral valve regurgitation.    5. The left atrium is mildly dilated in size.    6. The right ventricle is normal in size, with normal systolic function.    7. There is no pulmonary hypertension.   She is going to be having a sleep study in May. She is going to be seeing Dr. Vassie Loll. She did not feel that she got along with Dr. Sherene Sires. So she is happy to establish care with him. This  appointment is on May 6th. She will be set up for a sleep study.  Her husband has sleep apnea and thinks that the sleep study will be very enlightening.  She apparently has very intense fatigue daily.  In the morning, she never feels well rested.  She does snore at night.  Snoring has been ongoing for a number of years, but it has gotten worse.   She reports that when she exhales, she exhales very "hard" when she is asleep and when she is awake.  No one has ever really been able to explain this to her.  She has not started the naltrexone. Medicaid would not pay for it. There was a cash pay option, but the pharmacy talk to her husband and said that a 90-day supply would be cheaper.  However, he needed a new prescription for this and said that he would contact us.  I do not see that he contacted [place] at all.  Evidently, the next day, the pharmacy called and said that her prescription was ready but they only filled 15 days.  She also remains on the Zyrtec 20 mg twice daily and Pepcid 40 mg twice daily.  She uses the hydroxyzine at night.  She did start cromolyn again after it was on shortage. She has both the nebulized and the oral formulation.  She is doing the cromolyn four times daily and this is helping. Sometimes it makes her feel worse. But this is not frequent enough to make her stop taking it. She started taking half a packet at a time which did not make her feel as bad.  She does feel better taking the cromolyn then she does without taking it, but she has just been titrating it at home to balance the baseline symptoms versus the side effects of the medication.  She is wondering if a lot of this is anxiety.  She has had anxiety in the past.  She tells me that she just cannot sit still.  She thinks that she has had a lot of stress related to all of her doctors appointments.  She is also frustrated that nobody has any answers.  Her husband reports that she moves her legs constantly throughout the night.   She has never been diagnosed with restless leg syndrome.  Otherwise, there have been no changes to her past medical history, surgical history, family history, or social history.    Review of Systems  Constitutional: Negative.  Negative for chills, fever, malaise/fatigue and weight loss.  HENT:  Negative for congestion, ear discharge, ear pain and sinus pain.   Eyes:  Negative for pain, discharge and redness.  Respiratory:  Negative for cough, sputum production, shortness of breath and wheezing.   Cardiovascular: Negative.  Negative for chest pain and palpitations.  Gastrointestinal:  Negative for abdominal pain, constipation, diarrhea, heartburn, nausea and vomiting.  Skin:  Positive for itching and rash.  Neurological:  Negative for dizziness and headaches.  Endo/Heme/Allergies:  Positive for environmental allergies. Does not bruise/bleed easily.       Objective:   Blood pressure 134/86, pulse 82, temperature 98.7 F (37.1 C), resp. rate 16, height 5\' 5"  (1.651 m), weight (!) 312 lb (141.5 kg), last menstrual period 01/23/2014, SpO2 96 %. Body mass index is 51.92 kg/m.    Physical Exam Vitals reviewed.  Constitutional:      Appearance: She is well-developed and overweight.     Comments: Much more at ease. Talkative.   HENT:     Head: Normocephalic and atraumatic.     Right Ear: Tympanic membrane, ear canal and external ear normal. No drainage, swelling or tenderness. Tympanic membrane is not injected, scarred, erythematous, retracted or bulging.     Left Ear: Tympanic membrane, ear canal and external ear normal. No drainage, swelling or tenderness. Tympanic membrane is not injected, scarred, erythematous, retracted or bulging.     Nose: No nasal deformity, septal deviation, mucosal edema or rhinorrhea.     Right Nostril: No epistaxis.     Right Turbinates: Enlarged, swollen and pale.     Left Turbinates: Enlarged, swollen and pale.     Right Sinus: No maxillary sinus  tenderness or frontal sinus tenderness.     Left Sinus: No maxillary sinus tenderness or frontal sinus tenderness.     Comments: No polyps noted.     Mouth/Throat:     Mouth: Mucous membranes are  not pale and not dry.     Pharynx: Uvula midline.     Comments: Mild cobblestoning.  Eyes:     General:        Right eye: No discharge.        Left eye: No discharge.     Conjunctiva/sclera: Conjunctivae normal.     Right eye: Right conjunctiva is not injected. No chemosis.    Left eye: Left conjunctiva is not injected. No chemosis.    Pupils: Pupils are equal, round, and reactive to light.  Cardiovascular:     Rate and Rhythm: Normal rate and regular rhythm.     Heart sounds: Normal heart sounds.  Pulmonary:     Effort: Pulmonary effort is normal. No tachypnea, accessory muscle usage or respiratory distress.     Breath sounds: Normal breath sounds. Decreased air movement present. No wheezing, rhonchi or rales.     Comments: Decreased air movement at the bases.  No wheezing or crackles. Chest:     Chest wall: No tenderness.  Abdominal:     Tenderness: There is no abdominal tenderness. There is no guarding or rebound.  Lymphadenopathy:     Head:     Right side of head: No submandibular, tonsillar or occipital adenopathy.     Left side of head: No submandibular, tonsillar or occipital adenopathy.     Cervical: No cervical adenopathy.  Skin:    General: Skin is warm.     Capillary Refill: Capillary refill takes less than 2 seconds.     Coloration: Skin is not pale.     Findings: No abrasion, erythema, petechiae or rash. Rash is not papular, urticarial or vesicular.     Comments: No flushing noted.  Neurological:     Mental Status: She is alert.  Psychiatric:        Behavior: Behavior is cooperative.      Diagnostic studies:    Spirometry: results abnormal (FEV1: 1.72/67%, FVC: 2.27/69%, FEV1/FVC: 76%).    Spirometry consistent with possible restrictive disease.  Allergy  Studies: none      Malachi Bonds, MD  [facility]

## 2023-02-18 NOTE — Telephone Encounter (Signed)
Naltrexone 1.5 mg prescription was last sent in on 01/14/2023 for a one month supply with a start date of 01/14/2023 and an end date of 02/13/2023 . If patient is wanting a 90 day supply there isn't enough refills to cover the 90 days. Patient will need a new prescription for 90 day supply. Also there is a message in the prescription that states "has not started". Medication Notes:  Lenoria Farrier, CPhT  -- 01/31/2023 16:21  >> Kimber Relic Jan 31, 2023  4:21 PM  Has not started   I did notice that the Naltrexone 1.5 mg is not cover under patients benefit plan however the Naltrexone 4.5 mg is covered. Please advise.

## 2023-02-22 ENCOUNTER — Encounter (INDEPENDENT_AMBULATORY_CARE_PROVIDER_SITE_OTHER): Payer: Self-pay | Admitting: Gastroenterology

## 2023-02-22 ENCOUNTER — Ambulatory Visit (INDEPENDENT_AMBULATORY_CARE_PROVIDER_SITE_OTHER): Payer: Medicaid Other | Admitting: Gastroenterology

## 2023-02-22 ENCOUNTER — Encounter: Payer: Self-pay | Admitting: *Deleted

## 2023-02-22 VITALS — BP 154/96 | HR 86 | Temp 98.1°F | Ht 65.0 in | Wt 311.3 lb

## 2023-02-22 DIAGNOSIS — K824 Cholesterolosis of gallbladder: Secondary | ICD-10-CM | POA: Diagnosis not present

## 2023-02-22 DIAGNOSIS — R110 Nausea: Secondary | ICD-10-CM | POA: Diagnosis not present

## 2023-02-22 DIAGNOSIS — R1030 Lower abdominal pain, unspecified: Secondary | ICD-10-CM

## 2023-02-22 DIAGNOSIS — K760 Fatty (change of) liver, not elsewhere classified: Secondary | ICD-10-CM | POA: Diagnosis not present

## 2023-02-22 DIAGNOSIS — R140 Abdominal distension (gaseous): Secondary | ICD-10-CM

## 2023-02-22 NOTE — Progress Notes (Signed)
Referring Provider: Babs Sciara, MD Primary Care Physician:  Babs Sciara, MD Primary GI Physician: Levon Hedger   Chief Complaint  Patient presents with   Abdominal Pain    Follow up on mid low abdominal pain. Has recently been diagnosed with mass cell activation syndrome and wonders if that is related to her GI symptoms.    HPI:   Summer [name] is a 61 y.o. female with past medical history of heart failure (normal EF July 2022), Fibromyalgia, Bell's palsy, hypothyroidism, HTN.    Patient presenting today for follow up of abdominal pain.  Last seen November 2023, at that time reported about 1 month ago she began having rectal bleeding, noting blood on the toilet paper, no blood in the toilet or mixed in with stools, this lasted about 1 week and was occurring everytime she had a BM. She had some rectal discomfort at the time of bleeding. She is drinking more water and using hemorrhoid cream she was given a while back TID. She then began having blood in her urine again. Has seen PCP and urology, started on 30 day course of antibiotics though notably UA only showed protein and trace leukocytes . Has has had vaginal bleeding in the past as well, though none more recently. She has seen OB GYN and had transvaginal US with some thickening of the endometrium, started on progesterone and was recommended to have a procedure done for further evaluation of this which has not been done. She is having 2-3 BMs per day, stools are usually formed but soft, she has occasional diarrhea or harder stools. Stools continue to be orange in color. Most Recent Abd [place] complete showed hepatic steatosis.   Recommend rx anusol cream TID, mediterranean diet, regular exercise    Present:  She is seeing allergist and was diagnosed with mast cell activation syndrome, she is on multiple antihistamines and meds for this. She is trying to do low histamine diet which is difficult but this has helped some. She wonders if  the mast cell is causing her GI symptoms. She recently ate some chinese food and had severe abdominal pain, she took one of her mast cell medication, she laid down and when she woke up she felt better. She has cut out deli and red meats. Her appetite is up and down.   She is having occasional nausea and a lot of bloating after eating. She has intermittent diarrhea as well. These symptoms have been ongoing without other findings to suggest etiology.   She has had one episode of rectal bleeding since last visit.   Last Colonoscopy:11/24/21- Hemorrhoids found on perianal exam. - Six 3 to 8 mm polyps in the transverse colon, in the ascending colon and in the cecum, - Two 1 to 2 mm polyps in the transverse colon - Medium-sized lipoma in the transverse colon. - Three 3 to 5 mm polyps in the rectum and in the sigmoid colon - The entire examined colon is normal. Biopsied-normal - Internal hemorrhoids.  (total of 9 tubular adenomas, one submucosal lipoma and 1 hyperplastic polyp) Last Endoscopy:11/24/21 2 cm hiatal hernia. - Normal stomach. Biopsied-normal  - Normal examined duodenum. Biopsied-normal- no celiac  Recommended repeat Colonoscopy in 3 years   Past Medical History:  Diagnosis Date   Arthritis    Bell's palsy    Chest pain, unspecified    Dysrhythmia, cardiac    Fibromyalgia    Hashimoto's disease    History of CT scan 06/2017   "  coronary CT scan on 06/01/2017, this did not find any cardiac calcifications. Coronary calcium score of zero."   History of Holter monitoring    Hypertension    Mast cell activation syndrome    Thyroid disease    Urticaria     Past Surgical History:  Procedure Laterality Date   BIOPSY  11/24/2021   Procedure: BIOPSY;  Surgeon: Dolores Frame, MD;  [facility];  Service: Gastroenterology;;   COLONOSCOPY N/A 01/23/2014   Procedure: COLONOSCOPY;  Surgeon: Malissa Hippo, MD;  [facility];  Service: Endoscopy;   Laterality: N/A;  200   COLONOSCOPY WITH PROPOFOL N/A 11/24/2021   Procedure: COLONOSCOPY WITH PROPOFOL;  Surgeon: Dolores Frame, MD;  [facility];  Service: Gastroenterology;  Laterality: N/A;  205   ESOPHAGOGASTRODUODENOSCOPY (EGD) WITH PROPOFOL N/A 11/24/2021   Procedure: ESOPHAGOGASTRODUODENOSCOPY (EGD) WITH PROPOFOL;  Surgeon: Dolores Frame, MD;  [facility];  Service: Gastroenterology;  Laterality: N/A;   POLYPECTOMY  11/24/2021   Procedure: POLYPECTOMY;  Surgeon: Marguerita Merles, Reuel Boom, MD;  [facility];  Service: Gastroenterology;;   TONSILLECTOMY      Current Outpatient Medications  Medication Sig Dispense Refill   albuterol (PROVENTIL) (2.5 MG/3ML) 0.083% nebulizer solution Take 3 mLs (2.5 mg total) by nebulization every 6 (six) hours as needed for wheezing or shortness of breath. 75 mL 1   albuterol (VENTOLIN HFA) 108 (90 Base) MCG/ACT inhaler Inhale 2 puffs into the lungs every 6 (six) hours as needed for wheezing or shortness of breath. 18 g 1   ALPRAZolam (XANAX) 0.5 MG tablet Take 1 tablet (0.5 mg total) by mouth 2 (two) times daily as needed. for anxiety (Patient taking differently: Take 0.5 mg by mouth 2 (two) times daily as needed for anxiety.) 20 tablet 1   Ascorbic Acid (VITAMIN C) 500 MG CAPS Take 500 mg by mouth daily.     B Complex-C (SUPER B COMPLEX PO) Take 1 tablet by mouth daily at 6 (six) AM.     budesonide-formoterol (SYMBICORT) 80-4.5 MCG/ACT inhaler Inhale 2 puffs twice a day with spacer to help prevent cough and wheeze 1 each 5   cetirizine (ZYRTEC) 10 MG tablet Take 2 tablets (20 mg total) by mouth 2 (two) times daily. 120 tablet 5   chlorhexidine (PERIDEX) 0.12 % solution Use as directed 10 mLs in the mouth or throat 2 (two) times daily.     Cholecalciferol (VITAMIN D-3) 5000 UNITS TABS Take 5,000 Units by mouth daily.     cromolyn (GASTROCROM) 100 MG/5ML solution Take 100 mg by mouth 4 (four) times  daily -  before meals and at bedtime.     cromolyn (INTAL) 20 MG/2ML nebulizer solution Take 2 mLs (20 mg total) by nebulization in the morning, at noon, in the evening, and at bedtime. (Patient taking differently: Take 20 mg by nebulization 3 (three) times daily as needed (Asthma mastel activation syndrome).) 120 mL 5   EPINEPHrine 0.3 mg/0.3 mL IJ SOAJ injection Inject 0.3 mg into the muscle as needed for anaphylaxis. 1 each 1   famotidine (PEPCID) 40 MG tablet Take 1 tablet (40 mg total) by mouth 2 (two) times daily. (Patient taking differently: Take 20 mg by mouth 2 (two) times daily.) 60 tablet 5   furosemide (LASIX) 20 MG tablet 1  to 2 qam prn edema (Patient taking differently: Take 40 mg by mouth in the morning.) 60 tablet 5   Magnesium 200 MG TABS  Take 200 mg by mouth 2 (two) times daily.     Omega-3 Fatty Acids (FISH OIL) 1200 MG CAPS Take 1,200 mg by mouth daily.     potassium chloride SA (KLOR-CON M) 20 MEQ tablet 1 to 2 qam with lasix (Patient taking differently: Take 20 mEq by mouth daily.) 60 tablet 5   Probiotic Product (PROBIOTIC PO) Take 100 mg by mouth daily.     Quercetin 500 MG CAPS Take 500 mg by mouth daily.     RESTASIS 0.05 % ophthalmic emulsion Place 1 drop into both eyes daily.     rosuvastatin (CRESTOR) 5 MG tablet Take 1 tablet (5 mg total) by mouth daily. 30 tablet 5   thyroid (NP THYROID) 60 MG tablet TAKE 1 TABLET BY MOUTH ONCE DAILY BEFORE BREAKFAST 30 tablet 5   Naltrexone HCl, Pain, 4.5 MG CAPS Take contents of half a capsule nightly for two weeks and then increase to one capsule nightly thereafter. (Patient not taking: Reported on 02/22/2023) 90 capsule 0   No current facility-administered medications for this visit.    Allergies as of 02/22/2023 - Review Complete 02/18/2023  Allergen Reaction Noted   Bee venom Anaphylaxis and Hives 09/06/2012   Molds & smuts Shortness Of Breath 11/20/2021   Shellfish allergy Anaphylaxis and Hives 09/06/2012   Avelox  [moxifloxacin hcl in nacl] Other (See Comments) 11/22/2013   Moxifloxacin Other (See Comments) 11/22/2013   Ciprofloxacin Other (See Comments) 01/31/2023   Biaxin [clarithromycin]  11/29/2022   Tape Rash 11/20/2021    Family History  Adopted: Yes  Problem Relation Age of Onset   Cancer Mother        lung   Other Father    Other Daughter        Lyme's disease    Social History   Socioeconomic History   Marital status: Married    Spouse name: Not on file   Number of children: Not on file   Years of education: Not on file   Highest education level: Not on file  Occupational History   Not on file  Tobacco Use   Smoking status: Former    Packs/day: 1.00    Years: 20.00    Additional pack years: 0.00    Total pack years: 20.00    Types: Cigarettes    Quit date: 11/08/2003    Years since quitting: 19.3    Passive exposure: Current   Smokeless tobacco: Never   Tobacco comments:    quit smoking 10 yrs   Vaping Use   Vaping Use: Never used  Substance and Sexual Activity   Alcohol use: No   Drug use: No   Sexual activity: Yes    Birth control/protection: Post-menopausal  Other Topics Concern   Not on file  Social History Narrative   Not on file   Social Determinants of Health   Financial Resource Strain: Not on file  Food Insecurity: Not on file  Transportation Needs: Not on file  Physical Activity: Not on file  Stress: Not on file  Social Connections: Not on file    Review of systems General: negative for malaise, night sweats, fever, chills, weight los Neck: Negative for lumps, goiter, pain and significant neck swelling Resp: Negative for cough, wheezing, dyspnea at rest CV: Negative for chest pain, leg swelling, palpitations, orthopnea GI: denies melena, hematochezia, vomiting, constipation, dysphagia, odyonophagia, early satiety or unintentional weight loss. +nausea +abdominal discomfort +bloating +diarrhea  MSK: Negative for joint pain or swelling, back  pain, and muscle pain. Derm: Negative for itching or rash Psych: Denies depression, anxiety, memory loss, confusion. No homicidal or suicidal ideation.  Heme: Negative for prolonged bleeding, bruising easily, and swollen nodes. Endocrine: Negative for cold or heat intolerance, polyuria, polydipsia and goiter. Neuro: negative for tremor, gait imbalance, syncope and seizures. The remainder of the review of systems is noncontributory.  Physical Exam: BP (!) 154/96   Pulse 86   Temp 98.1 F (36.7 C) (Oral)   Ht  (1.651 m)   Wt (!) 311 lb 4.8 oz (141.2 kg)   LMP 01/23/2014 Comment: spotting  BMI 51.80 kg/m  General:   Alert and oriented. No distress noted. Pleasant and cooperative.  Head:  Normocephalic and atraumatic. Eyes:  Conjuctiva clear without scleral icterus. Mouth:  Oral mucosa pink and moist. Good dentition. No lesions. Heart: Normal rate and rhythm, s1 and s2 heart sounds present.  Lungs: Clear lung sounds in all lobes. Respirations equal and unlabored. Abdomen:  +BS, soft, non-tender and non-distended. No rebound or guarding. No HSM or masses noted. Derm: No palmar erythema or jaundice Msk:  Symmetrical without gross deformities. Normal posture. Extremities:  Without edema. Neurologic:  Alert and  oriented x4 Psych:  Alert and cooperative. Normal mood and affect.  Invalid input(s): "6 MONTHS"   ASSESSMENT: LIYLAH NAJARRO is a 62 y.o. female presenting today for follow up of abdominal pain with nausea and bloating.   Abdominal pain, nausea, diarrhea and bloating: ongoing, suspected secondary to IBS. Recently diagnosed with mast cell activation syndrome, potentially this could be contributing to some of her symptoms. She is trying to do a low histamine diet which has helped some with her abodminal pain. I discussed low FODMAP diet with her as some studies have shown this can be helpful with counteracting GI effects of mast cell syndrome, however, she may actually  benefit from nutrition referral for further assistance with her dietary changes in regards to this and her fatty liver as she has been unsuccessful in losing weight. She has never been tested for SIBO, I think it would be reasonable to rule this out as a contributing factor as well.    US done in October 2023 with small gallbladder polyp. Recommend repeat imaging for surveillance in 6 months. We will get this set up    PLAN:  Repeat US for GB polyp  2. Continue with dietary changes  3. Continue with probiotic  4. SIBO testing 5. Nutrition referral   All questions were answered, patient verbalized understanding and is in agreement with plan as outlined above.    Follow Up: 3 months   Demi Trieu L. Jeanmarie Hubert, MSN, APRN, AGNP-C Adult-Gerontology Nurse Practitioner [facility]  I have reviewed the note and agree with the APP's assessment as described in this progress note  Katrinka Blazing, MD Gastroenterology and Hepatology [facility]

## 2023-02-22 NOTE — Patient Instructions (Addendum)
We will get you scheduled for repeat US for gallbladder polyp I am referring you to nutrition for help with dietary changes and will get you referred to [facility] for SIBO testing Continue with daily probiotic, as discussed, I prefer one with multiple bacteria strains, aim for atleast 2-3  Follow up 3 months  It was a pleasure to see you today. I want to create trusting relationships with patients and provide genuine, compassionate, and quality care. I truly value your feedback! please be on the lookout for a survey regarding your visit with me today. I appreciate your input about our visit and your time in completing this!    Summer [name] L. Jeanmarie Hubert, MSN, APRN, AGNP-C Adult-Gerontology Nurse Practitioner [facility] at [facility]

## 2023-02-24 ENCOUNTER — Other Ambulatory Visit (HOSPITAL_COMMUNITY): Payer: Medicaid Other

## 2023-02-25 ENCOUNTER — Other Ambulatory Visit (HOSPITAL_COMMUNITY): Payer: Medicaid Other

## 2023-03-01 DIAGNOSIS — N950 Postmenopausal bleeding: Secondary | ICD-10-CM

## 2023-03-01 DIAGNOSIS — Z01818 Encounter for other preprocedural examination: Secondary | ICD-10-CM

## 2023-03-02 ENCOUNTER — Other Ambulatory Visit: Payer: Self-pay | Admitting: *Deleted

## 2023-03-02 DIAGNOSIS — M25561 Pain in right knee: Secondary | ICD-10-CM

## 2023-03-04 ENCOUNTER — Ambulatory Visit (HOSPITAL_COMMUNITY)
Admission: RE | Admit: 2023-03-04 | Discharge: 2023-03-04 | Disposition: A | Discharge status: Home / Self Care | Payer: Medicaid Other | Source: Ambulatory Visit | Attending: Gastroenterology | Admitting: Gastroenterology

## 2023-03-04 DIAGNOSIS — N281 Cyst of kidney, acquired: Secondary | ICD-10-CM | POA: Diagnosis not present

## 2023-03-04 DIAGNOSIS — K824 Cholesterolosis of gallbladder: Secondary | ICD-10-CM | POA: Insufficient documentation

## 2023-03-07 ENCOUNTER — Ambulatory Visit (INDEPENDENT_AMBULATORY_CARE_PROVIDER_SITE_OTHER): Payer: Medicaid Other | Admitting: Pulmonary Disease

## 2023-03-07 ENCOUNTER — Encounter (HOSPITAL_BASED_OUTPATIENT_CLINIC_OR_DEPARTMENT_OTHER): Payer: Self-pay | Admitting: Pulmonary Disease

## 2023-03-07 VITALS — BP 116/88 | HR 79 | Temp 97.9°F | Ht 65.0 in | Wt 313.6 lb

## 2023-03-07 DIAGNOSIS — G4733 Obstructive sleep apnea (adult) (pediatric): Secondary | ICD-10-CM

## 2023-03-07 DIAGNOSIS — J4540 Moderate persistent asthma, uncomplicated: Secondary | ICD-10-CM | POA: Insufficient documentation

## 2023-03-07 DIAGNOSIS — J432 Centrilobular emphysema: Secondary | ICD-10-CM | POA: Diagnosis not present

## 2023-03-07 NOTE — Assessment & Plan Note (Signed)
Given excessive daytime somnolence, narrow pharyngeal exam, witnessed apneas & loud snoring, obstructive sleep apnea is very likely & an overnight polysomnogram will be scheduled as a home study. The pathophysiology of obstructive sleep apnea , it's cardiovascular consequences & modes of treatment including CPAP were discused with the patient in detail & they evidenced understanding.  Pretest probability is high.  Her husband already has OSA and is on CPAP.  She would be willing to use if needed

## 2023-03-07 NOTE — Assessment & Plan Note (Signed)
Has been labeled as moderate persistent asthma and follows with allergist.  Maintained on regimen of Symbicort , cromolyn.  Albuterol as needed Serial spirometry is only showed restriction.  There is not a single spirometry that shows airway obstruction

## 2023-03-07 NOTE — Assessment & Plan Note (Signed)
Very mild based on her history of smoking, only noted on CT.  PFTs do not reflect in fact diffusion capacity is normal

## 2023-03-07 NOTE — Patient Instructions (Signed)
X home sleep test °

## 2023-03-07 NOTE — Progress Notes (Signed)
Subjective:    Patient ID: Summer [name], female    DOB: [birth year], 62 y.o.   MRN: [id number]  HPI  Summer [name] is a 62 year old ex-smoker presents for evaluation of sleep disordered breathing and shortness of breath. She smoked about 20 pack years before she quit in 2005 She is seeing the allergist for moderate persistent asthma and chronic urticaria diagnosed as mast cell activation  She reports difficulty exhaling, reports forceful exhalation especially in her sleep but occasionally when in her daytime.  Her allergist and cardiologist have not been able to explain the symptoms.  She is maintained on regimen of Symbicort and albuterol to use as needed.  Triggers for asthma include different odors, red onions. She is maintained on Zyrtec, Pepcid, hydroxyzine and cromolyn inhalation and oral She took allergy shots for several hours as a teenager, reports exposure to black mold in her late 3s   Epworth sleepiness score is 15 and she reports sleepiness while sitting and reading, inactive in a public place or as a passenger in a car or lying down to rest in the afternoon.  Husband has noted loud snoring and choking and gasping episodes in her sleep.  She reports nonrefreshing sleep in spite of sleeping for 12 hours she is still tired. Bedtime can be between 11 PM and midnight, sleep latency is 15 minutes, she sleeps on her side with 3 pillows due to reflux symptoms, reports 3-5 nocturnal awakenings and is out of bed by 11 AM feeling tired with dryness of mouth denies headache There is no history suggestive of cataplexy, sleep paralysis or parasomnias She has gained 50 pounds over the past 5 years   PMH - Perennial allergic rhinitis (indoor molds)  Allergic reaction - unknown trigger (possible mast cell activation syndrome, although tryptase has never been elevated)  Chronic urticaria - improved with daily antihistamines and montelukast      Significant tests/ events reviewed  PFT's   08/23/18  FEV1 2.44 (87 % ) ratio 0.76  p 6 % improvement from saba p 0 prior to study with DLCO  wnl FV curve nl and ERV 32% at wt 258   - Spirometry 04/08/2022  FEV1 2.6 (95%)  Ratio 0.96 with nl f/v at wt 302  Spirometry 4/20" ratio 76, FEV1 67%, FVC 69%  CTA chest 05/2021 mild emphysema, negative PE  Past Medical History:  Diagnosis Date   Arthritis    Bell's palsy    Chest pain, unspecified    Dysrhythmia, cardiac    Fibromyalgia    Hashimoto's disease    History of CT scan 06/2017   "coronary CT scan on 06/01/2017, this did not find any cardiac calcifications. Coronary calcium score of zero."   History of Holter monitoring    Hypertension    Mast cell activation syndrome (HCC)    Thyroid disease    Urticaria     Past Surgical History:  Procedure Laterality Date   BIOPSY  11/24/2021   Procedure: BIOPSY;  Surgeon: Dolores Frame, MD;  [facility];  Service: Gastroenterology;;   COLONOSCOPY N/A 01/23/2014   Procedure: COLONOSCOPY;  Surgeon: Malissa Hippo, MD;  [facility];  Service: Endoscopy;  Laterality: N/A;  200   COLONOSCOPY WITH PROPOFOL N/A 11/24/2021   Procedure: COLONOSCOPY WITH PROPOFOL;  Surgeon: Dolores Frame, MD;  [facility];  Service: Gastroenterology;  Laterality: N/A;  205   ESOPHAGOGASTRODUODENOSCOPY (EGD) WITH PROPOFOL N/A 11/24/2021   Procedure: ESOPHAGOGASTRODUODENOSCOPY (EGD) WITH  PROPOFOL;  Surgeon: Marguerita Merles, Reuel Boom, MD;  [facility];  Service: Gastroenterology;  Laterality: N/A;   POLYPECTOMY  11/24/2021   Procedure: POLYPECTOMY;  Surgeon: Marguerita Merles, Reuel Boom, MD;  [facility];  Service: Gastroenterology;;   TONSILLECTOMY      Allergies  Allergen Reactions   Bee Venom Anaphylaxis and Hives   Molds & Smuts Shortness Of Breath   Shellfish Allergy Anaphylaxis and Hives   Avelox [Moxifloxacin Hcl In Nacl] Other (See Comments)    "heart beats weird"    Moxifloxacin Other (See Comments)    "heart beats weird"   Ciprofloxacin Other (See Comments)    "heart beats weird"   Biaxin [Clarithromycin]     Nausea and heart palpitations   Tape Rash    Social History   Socioeconomic History   Marital status: Married    Spouse name: Not on file   Number of children: Not on file   Years of education: Not on file   Highest education level: Not on file  Occupational History   Not on file  Tobacco Use   Smoking status: Former    Packs/day: 1.00    Years: 20.00    Additional pack years: 0.00    Total pack years: 20.00    Types: Cigarettes    Quit date: 11/08/2003    Years since quitting: 19.3    Passive exposure: Current   Smokeless tobacco: Never   Tobacco comments:    quit smoking 10 yrs   Vaping Use   Vaping Use: Never used  Substance and Sexual Activity   Alcohol use: No   Drug use: No   Sexual activity: Yes    Birth control/protection: Post-menopausal  Other Topics Concern   Not on file  Social History Narrative   Not on file   Social Determinants of Health   Financial Resource Strain: Not on file  Food Insecurity: Not on file  Transportation Needs: Not on file  Physical Activity: Not on file  Stress: Not on file  Social Connections: Not on file  Intimate Partner Violence: Not on file    Family History  Adopted: Yes  Problem Relation Age of Onset   Cancer Mother        lung   Other Father    Other Daughter        Lyme's disease     Review of Systems Shortness of breath with activity Irregular heartbeats Acid heartburn indigestion Weight gain Abdominal pain, difficulty swallowing Tooth problems, headaches and nasal congestion Anxiety Feet swelling Joint stiffness    Objective:   Physical Exam  Gen. Pleasant, obese, in no distress, normal affect ENT - no pallor,icterus, no post nasal drip, class 3 airway Neck: No JVD, no thyromegaly, no carotid bruits Lungs: no use of accessory muscles, no dullness  to percussion, decreased without rales or rhonchi  Cardiovascular: Rhythm regular, heart sounds  normal, no murmurs or gallops, no peripheral edema Abdomen: soft and non-tender, no hepatosplenomegaly, BS normal. Musculoskeletal: No deformities, no cyanosis or clubbing Neuro:  alert, non focal, no tremors       Assessment & Plan:

## 2023-03-09 ENCOUNTER — Ambulatory Visit (INDEPENDENT_AMBULATORY_CARE_PROVIDER_SITE_OTHER): Payer: Medicaid Other | Admitting: Physician Assistant

## 2023-03-09 ENCOUNTER — Ambulatory Visit (HOSPITAL_COMMUNITY)
Admission: RE | Admit: 2023-03-09 | Discharge: 2023-03-09 | Disposition: A | Discharge status: Home / Self Care | Payer: Medicaid Other | Source: Ambulatory Visit | Attending: Vascular Surgery | Admitting: Vascular Surgery

## 2023-03-09 VITALS — BP 130/81 | HR 77 | Temp 97.9°F | Wt 315.0 lb

## 2023-03-09 DIAGNOSIS — I872 Venous insufficiency (chronic) (peripheral): Secondary | ICD-10-CM

## 2023-03-09 DIAGNOSIS — I7300 Raynaud's syndrome without gangrene: Secondary | ICD-10-CM | POA: Diagnosis not present

## 2023-03-09 DIAGNOSIS — M25562 Pain in left knee: Secondary | ICD-10-CM | POA: Insufficient documentation

## 2023-03-09 DIAGNOSIS — M368 Systemic disorders of connective tissue in other diseases classified elsewhere: Secondary | ICD-10-CM

## 2023-03-09 DIAGNOSIS — M25561 Pain in right knee: Secondary | ICD-10-CM | POA: Insufficient documentation

## 2023-03-09 NOTE — Progress Notes (Signed)
Requested by:  Babs Sciara, MD [address] [place],  [place] [address]  Reason for consultation:  venous stasis    History of Present Illness   Summer [name] is a 62 y.o. (09/03/1961) female who presents for evaluation of venous stasis. She says she has had swelling in her legs for a while. She also describes pain in her legs from thighs down. This occurs on ambulation and also rest. She gets aching but also shooting pains. She also on prolonged ambulation with get numbness in her thighs and feet. She does have Fibromyalgia and reports chronic back pain. She has recently been trying to elevate at recommendation of her PCP. This does help her legs feel better. She says she tried OTC compression socks and could not tolerate them. She does report cramping in her legs as well. This occurs mostly at night. She started taking magnesium supplement which has helped. Today she says she is however more concerned about recently noticing her toes turning blue and cold. This began after she had COVID in January of this year. She also notices they occasionally turn white and painful as well. This also happens in her fingers but not as commonly. This occurs when they get cold. She says she has to wear socks or slippers and she keeps gloves in her car. When she started having these symptoms she said she was looking up possible causes. She also talked to her PCP. She was concerned that she may have Raynaud's.  She has Hashimoto's and has been doing some reading about the possible connection. She does feel that with the warmer weather this is happening less often. She is former smoker, quit > 20 years ago.   Venous symptoms include: aching, tired, throbbing, swelling Onset/duration:  6 months  Occupation: disabled Aggravating factors: sitting, standing Alleviating factors: elevation Compression:  yes, Helps:  unsure Pain medications:  none Previous vein procedures:  none History of DVT:   no  Past Medical History:  Diagnosis Date   Arthritis    Bell's palsy    Chest pain, unspecified    Dysrhythmia, cardiac    Fibromyalgia    Hashimoto's disease    History of CT scan 06/2017   "coronary CT scan on 06/01/2017, this did not find any cardiac calcifications. Coronary calcium score of zero."   History of Holter monitoring    Hypertension    Mast cell activation syndrome (HCC)    Thyroid disease    Urticaria     Past Surgical History:  Procedure Laterality Date   BIOPSY  11/24/2021   Procedure: BIOPSY;  Surgeon: Dolores Frame, MD;  [facility];  Service: Gastroenterology;;   COLONOSCOPY N/A 01/23/2014   Procedure: COLONOSCOPY;  Surgeon: Malissa Hippo, MD;  [facility];  Service: Endoscopy;  Laterality: N/A;  200   COLONOSCOPY WITH PROPOFOL N/A 11/24/2021   Procedure: COLONOSCOPY WITH PROPOFOL;  Surgeon: Dolores Frame, MD;  [facility];  Service: Gastroenterology;  Laterality: N/A;  205   ESOPHAGOGASTRODUODENOSCOPY (EGD) WITH PROPOFOL N/A 11/24/2021   Procedure: ESOPHAGOGASTRODUODENOSCOPY (EGD) WITH PROPOFOL;  Surgeon: Dolores Frame, MD;  [facility];  Service: Gastroenterology;  Laterality: N/A;   POLYPECTOMY  11/24/2021   Procedure: POLYPECTOMY;  Surgeon: Dolores Frame, MD;  [facility];  Service: Gastroenterology;;   TONSILLECTOMY      Social History   Socioeconomic History   Marital status: Married  Spouse name: Not on file   Number of children: Not on file   Years of education: Not on file   Highest education level: Not on file  Occupational History   Not on file  Tobacco Use   Smoking status: Former    Packs/day: 1.00    Years: 20.00    Additional pack years: 0.00    Total pack years: 20.00    Types: Cigarettes    Quit date: 11/08/2003    Years since quitting: 19.3    Passive exposure: Current   Smokeless tobacco: Never   Tobacco comments:     quit smoking 10 yrs   Vaping Use   Vaping Use: Never used  Substance and Sexual Activity   Alcohol use: No   Drug use: No   Sexual activity: Yes    Birth control/protection: Post-menopausal  Other Topics Concern   Not on file  Social History Narrative   Not on file   Social Determinants of Health   Financial Resource Strain: Not on file  Food Insecurity: Not on file  Transportation Needs: Not on file  Physical Activity: Not on file  Stress: Not on file  Social Connections: Not on file  Intimate Partner Violence: Not on file    Family History  Adopted: Yes  Problem Relation Age of Onset   Cancer Mother        lung   Other Father    Other Daughter        Lyme's disease    Current Outpatient Medications  Medication Sig Dispense Refill   albuterol (PROVENTIL) (2.5 MG/3ML) 0.083% nebulizer solution Take 3 mLs (2.5 mg total) by nebulization every 6 (six) hours as needed for wheezing or shortness of breath. 75 mL 1   albuterol (VENTOLIN HFA) 108 (90 Base) MCG/ACT inhaler Inhale 2 puffs into the lungs every 6 (six) hours as needed for wheezing or shortness of breath. 18 g 1   ALPRAZolam (XANAX) 0.5 MG tablet Take 1 tablet (0.5 mg total) by mouth 2 (two) times daily as needed. for anxiety (Patient taking differently: Take 0.5 mg by mouth 2 (two) times daily as needed for anxiety.) 20 tablet 1   Ascorbic Acid (VITAMIN C) 500 MG CAPS Take 500 mg by mouth daily.     B Complex-C (SUPER B COMPLEX PO) Take 1 tablet by mouth daily at 6 (six) AM.     budesonide-formoterol (SYMBICORT) 80-4.5 MCG/ACT inhaler Inhale 2 puffs twice a day with spacer to help prevent cough and wheeze 1 each 5   chlorhexidine (PERIDEX) 0.12 % solution Use as directed 10 mLs in the mouth or throat 2 (two) times daily.     Cholecalciferol (VITAMIN D-3) 5000 UNITS TABS Take 5,000 Units by mouth daily.     cromolyn (GASTROCROM) 100 MG/5ML solution Take 100 mg by mouth 4 (four) times daily -  before meals and at  bedtime.     cromolyn (INTAL) 20 MG/2ML nebulizer solution Take 2 mLs (20 mg total) by nebulization in the morning, at noon, in the evening, and at bedtime. (Patient taking differently: Take 20 mg by nebulization 3 (three) times daily as needed (Asthma mastel activation syndrome).) 120 mL 5   EPINEPHrine 0.3 mg/0.3 mL IJ SOAJ injection Inject 0.3 mg into the muscle as needed for anaphylaxis. 1 each 1   furosemide (LASIX) 20 MG tablet 1  to 2 qam prn edema (Patient taking differently: Take 40 mg by mouth in the morning.) 60 tablet 5  Magnesium 200 MG TABS Take 200 mg by mouth 2 (two) times daily.     Naltrexone HCl, Pain, 4.5 MG CAPS Take contents of half a capsule nightly for two weeks and then increase to one capsule nightly thereafter. 90 capsule 0   Omega-3 Fatty Acids (FISH OIL) 1200 MG CAPS Take 1,200 mg by mouth daily.     potassium chloride SA (KLOR-CON M) 20 MEQ tablet 1 to 2 qam with lasix (Patient taking differently: Take 20 mEq by mouth daily.) 60 tablet 5   Probiotic Product (PROBIOTIC PO) Take 100 mg by mouth daily.     Quercetin 500 MG CAPS Take 500 mg by mouth daily.     RESTASIS 0.05 % ophthalmic emulsion Place 1 drop into both eyes daily.     rosuvastatin (CRESTOR) 5 MG tablet Take 1 tablet (5 mg total) by mouth daily. 30 tablet 5   thyroid (NP THYROID) 60 MG tablet TAKE 1 TABLET BY MOUTH ONCE DAILY BEFORE BREAKFAST 30 tablet 5   cetirizine (ZYRTEC) 10 MG tablet Take 2 tablets (20 mg total) by mouth 2 (two) times daily. 120 tablet 5   famotidine (PEPCID) 40 MG tablet Take 1 tablet (40 mg total) by mouth 2 (two) times daily. (Patient taking differently: Take 20 mg by mouth 2 (two) times daily.) 60 tablet 5   No current facility-administered medications for this visit.    Allergies  Allergen Reactions   Bee Venom Anaphylaxis and Hives   Molds & Smuts Shortness Of Breath   Shellfish Allergy Anaphylaxis and Hives   Avelox [Moxifloxacin Hcl In Nacl] Other (See Comments)     "heart beats weird"   Moxifloxacin Other (See Comments)    "heart beats weird"   Ciprofloxacin Other (See Comments)    "heart beats weird"   Biaxin [Clarithromycin]     Nausea and heart palpitations   Tape Rash    REVIEW OF SYSTEMS (negative unless checked):   Cardiac:  []  Chest pain or chest pressure? []  Shortness of breath upon activity? []  Shortness of breath when lying flat? []  Irregular heart rhythm?  Vascular:  []  Pain in calf, thigh, or hip brought on by walking? []  Pain in feet at night that wakes you up from your sleep? []  Blood clot in your veins? [x]  Leg swelling?  Pulmonary:  []  Oxygen at home? []  Productive cough? []  Wheezing?  Neurologic:  []  Sudden weakness in arms or legs? []  Sudden numbness in arms or legs? []  Sudden onset of difficult speaking or slurred speech? []  Temporary loss of vision in one eye? []  Problems with dizziness?  Gastrointestinal:  []  Blood in stool? []  Vomited blood?  Genitourinary:  []  Burning when urinating? []  Blood in urine?  Psychiatric:  []  Major depression  Hematologic:  []  Bleeding problems? []  Problems with blood clotting?  Dermatologic:  []  Rashes or ulcers?  Constitutional:  []  Fever or chills?  Ear/Nose/Throat:  []  Change in hearing? []  Nose bleeds? []  Sore throat?  Musculoskeletal:  []  Back pain? []  Joint pain? []  Muscle pain?   Physical Examination     Vitals:   03/09/23 1043  BP: 130/81  Pulse: 77  Temp: 97.9 F (36.6 C)  TempSrc: Temporal  SpO2: 99%  Weight: (!) 315 lb (142.9 kg)   Body mass index is 52.42 kg/m.  General:  WDWN in NAD; vital signs documented above Gait: Normal HENT: WNL, normocephalic Pulmonary: normal non-labored breathing , without wheezing Cardiac: regular HR Abdomen: soft, NT, no masses Vascular  Exam/Pulses:  Right Left  Radial 2+ (normal) 2+ (normal)  Femoral 2+ (normal) 2+ (normal)  DP 2+ (normal) 2+ (normal)  PT 2+ (normal) 2+ (normal)    Extremities: without varicose veins, without reticular veins, with edema, without stasis pigmentation, without lipodermatosclerosis, without ulcers. Toes normal appearing in color, cool to the touch Musculoskeletal: no muscle wasting or atrophy  Neurologic: A&O X 3;  No focal weakness or paresthesias are detected Psychiatric:  The pt has Normal affect.  Non-invasive Vascular Imaging   BLE Venous Insufficiency Duplex (03/09/23): LLE: No DVT and SVT GSV reflux SFJ and at knee GSV diameter [phone] No SSV reflux  No deep venous reflux   Medical Decision Making   Summer [name] is a 62 y.o. female who presents with: LLE chronic venous insufficiency with swelling and aching. I think she likely also has Raynaud's based on her symptoms. Clinically she does not have any significant arterial disease with easily palpable pulses bilaterally and also triphasic doppler signals. Duplex today on her veins shows no DVT or SVT. No deep reflux of SSV reflux. She does have some reflux in her GSV at the [facility] and distal thigh. Her vein is of adequate size to be considered for ablation. I discussed ablation with her as a possible option. However I discussed that with her multifactorial symptoms it would be hard to know if this would provider her any relief. At this time I have recommended the conservative therapy as stated below and advised her that if she tries these things and feels they are helping that she can always be considered for the ablation in the future. Based on the patient's history and examination, I recommend: elevation above level of heart, compression stockings, exercise, refraining from prolonged sitting or standing. For her Raynaud's I discussed that it is really symptom management. Keeping toes and fingers warm, warming blankets, socks/ gloves etc. She has no ischemic signs or symptoms. This may be related to her Hashimoto's. If it worsens she could consider Calcium channel blocker or  Vasodilator medication  I discussed with the patient the use of her 15-20 mm thigh high compression stockings. She was measured for these at today's visit and would need an XXL size. She says she will have to return to purchase these at a later day She can follow up as needed if she has new or worsening symptoms   Graceann Congress, PA-C [facility] Office: [phone]  03/09/2023, 10:47 AM  Clinic MD: Caesar Bookman

## 2023-03-15 ENCOUNTER — Ambulatory Visit (INDEPENDENT_AMBULATORY_CARE_PROVIDER_SITE_OTHER): Payer: Medicaid Other | Admitting: Family Medicine

## 2023-03-15 VITALS — Ht 65.0 in | Wt 310.4 lb

## 2023-03-15 DIAGNOSIS — I878 Other specified disorders of veins: Secondary | ICD-10-CM

## 2023-03-15 DIAGNOSIS — F419 Anxiety disorder, unspecified: Secondary | ICD-10-CM | POA: Diagnosis not present

## 2023-03-15 DIAGNOSIS — M545 Low back pain, unspecified: Secondary | ICD-10-CM | POA: Diagnosis not present

## 2023-03-15 DIAGNOSIS — G4730 Sleep apnea, unspecified: Secondary | ICD-10-CM

## 2023-03-15 DIAGNOSIS — R0609 Other forms of dyspnea: Secondary | ICD-10-CM

## 2023-03-15 DIAGNOSIS — I340 Nonrheumatic mitral (valve) insufficiency: Secondary | ICD-10-CM

## 2023-03-15 DIAGNOSIS — U099 Post covid-19 condition, unspecified: Secondary | ICD-10-CM | POA: Diagnosis not present

## 2023-03-15 NOTE — Progress Notes (Signed)
Subjective:    Patient ID: Summer [name], female    DOB: 1961-09-22, 62 y.o.   MRN: [id number]  HPI  Patient arrives for a follow up on DOE.  Patient here today for follow-up Has had multiple visits since last visit Has seen vascular surgery that evaluated her and stated that her circulation seems to be okay but she has some level of Raynauds syndrome In addition to this they recommend knee-high surgical support stockings but very difficult for patient to do this because of her size She also relates a lot of fatigue tiredness shortness of breath with activity more so since COVID Pulmonary function and evaluation by allergist asthma Dr. Guy Sandifer restrictive lung disease Pulmonary doctor does not recommend repeating of this They do recommend to have sleep study Cardiologist did echo which shows grade 1 diastolic dysfunction but normal ejection fraction Mitral regurgitation is noted Morbid obesity is noted patient trying portion control but unable to do much in the way of physical activity because of significant shortness of breath Cardiology states they did not find any evidence of coronary artery issues currently  Patient states she like her new pulmonologist and cardiologist- stated they have not found anything major so far -just some minor issues. She also relates low back discomfort along with numbness into the legs with any significant walking if she has to stop and then after a period of time it settles down and gets better then if she tries to walk more she had the same symptoms Review of Systems     Objective:   Physical Exam General-in no acute distress Eyes-no discharge Lungs-respiratory rate normal, CTA CV-no murmurs,RRR Extremities skin warm dry no edema Neuro grossly normal Behavior normal, alert        Assessment & Plan:  1. DOE (dyspnea on exertion) This is multifactorial.  Partly related to restrictive lung disease which is related to morbid obesity, also post  COVID changes, grade 1 diastolic dysfunction might be contributing some I doubt the mitral valve regurgitation is causing her shortness of breath Cardiology does not find any evidence of coronary artery disease and does not recommend stress test or catheterization Pulmonologist recommends sleep study which is pending-patient states she has not been notified when this will be She does see allergist asthma doctor who has her on some inhalers to try to help The patient is trying to lose weight but it is very difficult for him to do so because she cannot exercise and there are limited income this may we be expensive  2. Post covid-19 condition, unspecified Patient has had significant shortness of breath since having COVID lack of energy lack of proper breathing finds herself feeling very fatigued  3. Venous stasis Has seen vein specialist who stated that circulation was okay they did not recommend any type of procedures  4. Sleep apnea, unspecified type Sleep study is recommended pulmonary is setting this up  5. Severe obesity (BMI >= 40) (HCC) It is very difficult for this patient lose weight GLP-1 medicines would be beneficial but not approved from a insurance standpoint Also patient is wary of the potential side effects Patient is not interested in gastric bypass surgery  - Amb ref to Medical Nutrition Therapy-MNT  6. Nonrheumatic mitral valve regurgitation Follows with cardiology [facility] Action fraction very good 60-65%  7. Lumbar pain Patient relates some low back pain discomfort with numbness into the legs when she does any significant walking she has to stop to get  it to go away more than likely this is spinal stenosis recommend starting off with plain x-rays will more than likely will progress forward with doing MRI of lumbar spine with - DG Lumbar Spine Complete  8. Anxiety Patient is requesting counseling for stress and anxiety.  She states she prefers to have  someone in the [place] area or even because of travel issues - Ambulatory referral to Psychology

## 2023-03-17 DIAGNOSIS — R0609 Other forms of dyspnea: Secondary | ICD-10-CM | POA: Diagnosis not present

## 2023-03-17 DIAGNOSIS — I700 Atherosclerosis of aorta: Secondary | ICD-10-CM | POA: Diagnosis not present

## 2023-03-17 DIAGNOSIS — J438 Other emphysema: Secondary | ICD-10-CM | POA: Diagnosis not present

## 2023-03-17 DIAGNOSIS — Z6841 Body Mass Index (BMI) 40.0 and over, adult: Secondary | ICD-10-CM | POA: Diagnosis not present

## 2023-03-17 NOTE — Pre-Procedure Instructions (Signed)
Messaged Dr Charlotta Newton about cardiac clearance note.

## 2023-03-18 ENCOUNTER — Other Ambulatory Visit: Payer: Self-pay

## 2023-03-18 ENCOUNTER — Encounter (HOSPITAL_COMMUNITY)
Admission: RE | Admit: 2023-03-18 | Discharge: 2023-03-18 | Disposition: A | Discharge status: Home / Self Care | Payer: Medicaid Other | Source: Ambulatory Visit | Attending: Obstetrics & Gynecology | Admitting: Obstetrics & Gynecology

## 2023-03-18 ENCOUNTER — Encounter (HOSPITAL_COMMUNITY): Payer: Self-pay

## 2023-03-18 HISTORY — DX: Sleep apnea, unspecified: G47.30

## 2023-03-18 NOTE — H&P (Signed)
[facility] Attending History and Physical  Summer [name] is a 62 y.o. [phone] postmenopausal female who presents for scheduled hysteroscopy D&C due to postmenopausal bleeding.  In review, late February patient reported a few days of bright red bleeding with passage of small clots.  Bleeding is intermittent and at times will have to change a pad every 2 hours.  She has had multiple EMB's, which have all been negative.  The plan was to start on progesterone; however, patient was concerned about cardiac risks with this medication and did not start the medication. Notes occasional pelvic pain when bleeding. Reports no further bleeding.  Initially this procedure was scheduled in March but needed to be delayed due to chest pain.  Patient has been cleared from a cardiac standpoint  Denies any abnormal vaginal discharge, fevers, chills, sweats, dysuria, nausea, vomiting, other GI or GU symptoms or other general symptoms.    Past Medical History:  Diagnosis Date   Arthritis    Bell's palsy    Chest pain, unspecified    Dysrhythmia, cardiac    Fibromyalgia    Hashimoto's disease    History of CT scan 06/2017   "coronary CT scan on 06/01/2017, this did not find any cardiac calcifications. Coronary calcium score of zero."   History of Holter monitoring    Hypertension    Mast cell activation syndrome (HCC)    Thyroid disease    Urticaria    Past Surgical History:  Procedure Laterality Date   BIOPSY  11/24/2021   Procedure: BIOPSY;  Surgeon: Dolores Frame, MD;  [facility];  Service: Gastroenterology;;   COLONOSCOPY N/A 01/23/2014   Procedure: COLONOSCOPY;  Surgeon: Malissa Hippo, MD;  [facility];  Service: Endoscopy;  Laterality: N/A;  200   COLONOSCOPY WITH PROPOFOL N/A 11/24/2021   Procedure: COLONOSCOPY WITH PROPOFOL;  Surgeon: Dolores Frame, MD;  [facility];  Service: Gastroenterology;   Laterality: N/A;  205   ESOPHAGOGASTRODUODENOSCOPY (EGD) WITH PROPOFOL N/A 11/24/2021   Procedure: ESOPHAGOGASTRODUODENOSCOPY (EGD) WITH PROPOFOL;  Surgeon: Dolores Frame, MD;  [facility];  Service: Gastroenterology;  Laterality: N/A;   POLYPECTOMY  11/24/2021   Procedure: POLYPECTOMY;  Surgeon: Dolores Frame, MD;  [facility];  Service: Gastroenterology;;   TONSILLECTOMY     OB History  Gravida Para Term Preterm AB Living  3 1 1   2 1   SAB IAB Ectopic Multiple Live Births  1       1    # Outcome Date GA Lbr Len/2nd Weight Sex Delivery Anes PTL Lv  3 AB           2 SAB           1 Term     F Vag-Spont   LIV  Patient denies any other pertinent gynecologic issues.  No current facility-administered medications on file prior to encounter.   Current Outpatient Medications on File Prior to Encounter  Medication Sig Dispense Refill   albuterol (PROVENTIL) (2.5 MG/3ML) 0.083% nebulizer solution Take 3 mLs (2.5 mg total) by nebulization every 6 (six) hours as needed for wheezing or shortness of breath. 75 mL 1   albuterol (VENTOLIN HFA) 108 (90 Base) MCG/ACT inhaler Inhale 2 puffs into the lungs every 6 (six) hours as needed for wheezing or shortness of breath. 18 g 1   ALPRAZolam (XANAX) 0.5 MG tablet Take 1 tablet (0.5 mg total) by mouth 2 (  two) times daily as needed. for anxiety (Patient taking differently: Take 0.5 mg by mouth 2 (two) times daily as needed for anxiety.) 20 tablet 1   Ascorbic Acid (VITAMIN C) 500 MG CAPS Take 500 mg by mouth daily.     B Complex-C (SUPER B COMPLEX PO) Take 1 tablet by mouth daily at 6 (six) AM.     Cholecalciferol (VITAMIN D-3) 5000 UNITS TABS Take 5,000 Units by mouth daily.     EPINEPHrine 0.3 mg/0.3 mL IJ SOAJ injection Inject 0.3 mg into the muscle as needed for anaphylaxis. 1 each 1   Magnesium 200 MG TABS Take 200 mg by mouth 2 (two) times daily.     Omega-3 Fatty Acids (FISH OIL) 1200 MG CAPS Take 1,200  mg by mouth daily.     Probiotic Product (PROBIOTIC PO) Take 100 mg by mouth daily.     Quercetin 500 MG CAPS Take 500 mg by mouth daily.     RESTASIS 0.05 % ophthalmic emulsion Place 1 drop into both eyes daily.     chlorhexidine (PERIDEX) 0.12 % solution Use as directed 10 mLs in the mouth or throat 2 (two) times daily.     Allergies  Allergen Reactions   Bee Venom Anaphylaxis and Hives   Molds & Smuts Shortness Of Breath   Shellfish Allergy Anaphylaxis and Hives   Avelox [Moxifloxacin Hcl In Nacl] Other (See Comments)    "heart beats weird"   Moxifloxacin Other (See Comments)    "heart beats weird"   Ciprofloxacin Other (See Comments)    "heart beats weird"   Biaxin [Clarithromycin]     Nausea and heart palpitations   Tape Rash    Social History:   reports that she quit smoking about 19 years ago. Her smoking use included cigarettes. She has a 20.00 pack-year smoking history. She has been exposed to tobacco smoke. She has never used smokeless tobacco. She reports that she does not drink alcohol and does not use drugs. Family History  Adopted: Yes  Problem Relation Age of Onset   Cancer Mother        lung   Other Father    Other Daughter        Lyme's disease    Review of Systems: Pertinent items noted in HPI and remainder of comprehensive ROS otherwise negative.  PHYSICAL EXAM: Last menstrual period 01/23/2014. BP (!) 148/97 (BP Location: Right Arm)   Pulse 99   Temp 98.1 F (36.7 C)   Resp 14   LMP 01/23/2014 Comment: spotting  SpO2 100%   CONSTITUTIONAL: Well-developed, well-nourished female in no acute distress.  SKIN: Skin is warm and dry. No rash noted. Not diaphoretic. No erythema. No pallor. NEUROLOGIC: Alert and oriented to person, place, and time. Normal reflexes, muscle tone coordination. No cranial nerve deficit noted. PSYCHIATRIC: Normal mood and affect. Normal behavior. Normal judgment and thought content. CARDIOVASCULAR: Normal heart rate noted,  regular rhythm RESPIRATORY: Effort and breath sounds normal, no problems with respiration noted ABDOMEN: obeseSoft, nontender, nondistended. PELVIC: deferred MUSCULOSKELETAL: no calf tenderness bilaterally EXT: no edema bilaterally, normal pulses  Labs: Last EMB Feb 2022: negative  Imaging Studies:  04/2022 pelvic US: 11.6 x 4.6 x 7.5 cm = volume: 225 mL. Posterior wall transmural leiomyoma 4.5 x 2.3 x 4.4 cm. No additional masses. 7mm endometrium   Assessment: Postmenopausal bleeding  Plan: Hysteroscopy, D&C with possible intervention -pt notes h/o infection s/p D&C in the past, Ancef IV x 1 -NPO -LR @ 125cc/hr -  SCDs to OR -Risk/benefits and alternatives reviewed with the patient including but not limited to risk of bleeding, infection and injury to surrounding organs.  Discussed potential risk of uterine perforation requiring further surgical intervention.  Questions and concerns were addressed and pt desires to proceed  Myna Hidalgo, DO Attending Obstetrician & Gynecologist, [facility] for [facility], [facility]

## 2023-03-22 ENCOUNTER — Ambulatory Visit (HOSPITAL_COMMUNITY)
Admission: RE | Admit: 2023-03-22 | Discharge: 2023-03-22 | Disposition: A | Discharge status: Home / Self Care | Payer: Medicaid Other | Attending: Obstetrics & Gynecology | Admitting: Obstetrics & Gynecology

## 2023-03-22 ENCOUNTER — Other Ambulatory Visit: Payer: Self-pay

## 2023-03-22 ENCOUNTER — Ambulatory Visit (HOSPITAL_BASED_OUTPATIENT_CLINIC_OR_DEPARTMENT_OTHER): Payer: Medicaid Other | Admitting: Anesthesiology

## 2023-03-22 ENCOUNTER — Encounter (HOSPITAL_COMMUNITY)
Admission: RE | Disposition: A | Discharge status: Home / Self Care | Payer: Self-pay | Source: Home / Self Care | Attending: Obstetrics & Gynecology

## 2023-03-22 ENCOUNTER — Ambulatory Visit (HOSPITAL_COMMUNITY): Payer: Medicaid Other | Admitting: Anesthesiology

## 2023-03-22 DIAGNOSIS — J449 Chronic obstructive pulmonary disease, unspecified: Secondary | ICD-10-CM | POA: Diagnosis not present

## 2023-03-22 DIAGNOSIS — R9389 Abnormal findings on diagnostic imaging of other specified body structures: Secondary | ICD-10-CM

## 2023-03-22 DIAGNOSIS — Z01818 Encounter for other preprocedural examination: Secondary | ICD-10-CM

## 2023-03-22 DIAGNOSIS — I340 Nonrheumatic mitral (valve) insufficiency: Secondary | ICD-10-CM | POA: Diagnosis not present

## 2023-03-22 DIAGNOSIS — Z87891 Personal history of nicotine dependence: Secondary | ICD-10-CM

## 2023-03-22 DIAGNOSIS — Z09 Encounter for follow-up examination after completed treatment for conditions other than malignant neoplasm: Secondary | ICD-10-CM | POA: Insufficient documentation

## 2023-03-22 DIAGNOSIS — N950 Postmenopausal bleeding: Secondary | ICD-10-CM | POA: Diagnosis present

## 2023-03-22 DIAGNOSIS — D8940 Mast cell activation, unspecified: Secondary | ICD-10-CM | POA: Insufficient documentation

## 2023-03-22 DIAGNOSIS — I10 Essential (primary) hypertension: Secondary | ICD-10-CM | POA: Insufficient documentation

## 2023-03-22 DIAGNOSIS — M797 Fibromyalgia: Secondary | ICD-10-CM | POA: Diagnosis not present

## 2023-03-22 DIAGNOSIS — E039 Hypothyroidism, unspecified: Secondary | ICD-10-CM | POA: Insufficient documentation

## 2023-03-22 DIAGNOSIS — Z79899 Other long term (current) drug therapy: Secondary | ICD-10-CM | POA: Diagnosis not present

## 2023-03-22 DIAGNOSIS — Z6841 Body Mass Index (BMI) 40.0 and over, adult: Secondary | ICD-10-CM | POA: Diagnosis not present

## 2023-03-22 DIAGNOSIS — K219 Gastro-esophageal reflux disease without esophagitis: Secondary | ICD-10-CM | POA: Insufficient documentation

## 2023-03-22 HISTORY — PX: HYSTEROSCOPY WITH D & C: SHX1775

## 2023-03-22 SURGERY — DILATATION AND CURETTAGE /HYSTEROSCOPY
Anesthesia: General | Site: Vagina

## 2023-03-22 MED ORDER — PROPOFOL 10 MG/ML IV BOLUS
INTRAVENOUS | Status: AC
  Filled 2023-03-22: qty 20

## 2023-03-22 MED ORDER — SODIUM CHLORIDE FLUSH 0.9 % IV SOLN
INTRAVENOUS | Status: AC
  Filled 2023-03-22: qty 20

## 2023-03-22 MED ORDER — MEPERIDINE HCL 50 MG/ML IJ SOLN: 6.2500 mg | INTRAMUSCULAR | Status: DC | PRN

## 2023-03-22 MED ORDER — ONDANSETRON HCL 4 MG/2ML IJ SOLN
INTRAMUSCULAR | Status: AC
  Filled 2023-03-22: qty 2

## 2023-03-22 MED ORDER — SUCCINYLCHOLINE CHLORIDE 200 MG/10ML IV SOSY
PREFILLED_SYRINGE | INTRAVENOUS | Status: DC | PRN
  Administered 2023-03-22: 120 mg via INTRAVENOUS

## 2023-03-22 MED ORDER — GLUCAGON HCL RDNA (DIAGNOSTIC) 1 MG IJ SOLR
INTRAMUSCULAR | Status: AC
  Filled 2023-03-22: qty 1

## 2023-03-22 MED ORDER — DEXAMETHASONE SODIUM PHOSPHATE 10 MG/ML IJ SOLN
INTRAMUSCULAR | Status: DC | PRN
  Administered 2023-03-22: 10 mg via INTRAVENOUS

## 2023-03-22 MED ORDER — FENTANYL CITRATE (PF) 100 MCG/2ML IJ SOLN
INTRAMUSCULAR | Status: AC
  Filled 2023-03-22: qty 2

## 2023-03-22 MED ORDER — LIDOCAINE-EPINEPHRINE 0.5 %-1:200000 IJ SOLN
INTRAMUSCULAR | Status: DC | PRN
  Administered 2023-03-22: 10 mL

## 2023-03-22 MED ORDER — LIDOCAINE-EPINEPHRINE 0.5 %-1:200000 IJ SOLN
INTRAMUSCULAR | Status: AC
  Filled 2023-03-22: qty 50

## 2023-03-22 MED ORDER — ORAL CARE MOUTH RINSE: 15.0000 mL | Freq: Once | OROMUCOSAL | Status: AC

## 2023-03-22 MED ORDER — SODIUM CHLORIDE FLUSH 0.9 % IV SOLN
INTRAVENOUS | Status: AC
  Filled 2023-03-22: qty 10

## 2023-03-22 MED ORDER — CEFAZOLIN SODIUM-DEXTROSE 2-4 GM/100ML-% IV SOLN
INTRAVENOUS | Status: AC
  Filled 2023-03-22: qty 100

## 2023-03-22 MED ORDER — PROPOFOL 10 MG/ML IV BOLUS
INTRAVENOUS | Status: DC | PRN
  Administered 2023-03-22: 200 mg via INTRAVENOUS
  Administered 2023-03-22 (×2): 50 mg via INTRAVENOUS

## 2023-03-22 MED ORDER — KETOROLAC TROMETHAMINE 30 MG/ML IJ SOLN
INTRAMUSCULAR | Status: DC | PRN
  Administered 2023-03-22: 30 mg via INTRAVENOUS

## 2023-03-22 MED ORDER — LACTATED RINGERS IV SOLN: INTRAVENOUS | Status: DC

## 2023-03-22 MED ORDER — BUPIVACAINE-EPINEPHRINE (PF) 0.25% -1:200000 IJ SOLN
INTRAMUSCULAR | Status: AC
  Filled 2023-03-22: qty 30

## 2023-03-22 MED ORDER — ONDANSETRON HCL 4 MG/2ML IJ SOLN: 4.0000 mg | Freq: Once | INTRAMUSCULAR | Status: DC | PRN

## 2023-03-22 MED ORDER — KETOROLAC TROMETHAMINE 30 MG/ML IJ SOLN
INTRAMUSCULAR | Status: AC
  Filled 2023-03-22: qty 1

## 2023-03-22 MED ORDER — HYDROMORPHONE HCL 1 MG/ML IJ SOLN
0.2500 mg | INTRAMUSCULAR | Status: DC | PRN
  Administered 2023-03-22: 0.5 mg via INTRAVENOUS
  Filled 2023-03-22: qty 0.5

## 2023-03-22 MED ORDER — SPOT INK MARKER SYRINGE KIT
PACK | SUBMUCOSAL | Status: AC
  Filled 2023-03-22: qty 10

## 2023-03-22 MED ORDER — CEFAZOLIN SODIUM-DEXTROSE 1-4 GM/50ML-% IV SOLN
1.0000 g | Freq: Once | INTRAVENOUS | Status: AC
  Administered 2023-03-22 (×2): 1 g via INTRAVENOUS
  Filled 2023-03-22: qty 50

## 2023-03-22 MED ORDER — CEFAZOLIN SODIUM-DEXTROSE 2-3 GM-%(50ML) IV SOLR
INTRAVENOUS | Status: DC | PRN
  Administered 2023-03-22: 2 g via INTRAVENOUS

## 2023-03-22 MED ORDER — LIDOCAINE HCL (PF) 2 % IJ SOLN
INTRAMUSCULAR | Status: AC
  Filled 2023-03-22: qty 5

## 2023-03-22 MED ORDER — SODIUM CHLORIDE 0.9 % IR SOLN
Status: DC | PRN
  Administered 2023-03-22: 1000 mL

## 2023-03-22 MED ORDER — CROMOLYN SODIUM 20 MG/2ML IN NEBU
20.0000 mg | INHALATION_SOLUTION | Freq: Once | RESPIRATORY_TRACT | Status: DC
  Filled 2023-03-22: qty 2

## 2023-03-22 MED ORDER — DEXMEDETOMIDINE HCL IN NACL 80 MCG/20ML IV SOLN
INTRAVENOUS | Status: AC
  Filled 2023-03-22: qty 20

## 2023-03-22 MED ORDER — DEXMEDETOMIDINE HCL IN NACL 80 MCG/20ML IV SOLN
INTRAVENOUS | Status: DC | PRN
  Administered 2023-03-22: 88 ug via INTRAVENOUS

## 2023-03-22 MED ORDER — CHLORHEXIDINE GLUCONATE 0.12 % MT SOLN
15.0000 mL | Freq: Once | OROMUCOSAL | Status: AC
  Administered 2023-03-22: 15 mL via OROMUCOSAL

## 2023-03-22 MED ORDER — LIDOCAINE HCL (CARDIAC) PF 100 MG/5ML IV SOSY
PREFILLED_SYRINGE | INTRAVENOUS | Status: DC | PRN
  Administered 2023-03-22: 60 mg via INTRAVENOUS

## 2023-03-22 MED ORDER — ONDANSETRON HCL 4 MG/2ML IJ SOLN
INTRAMUSCULAR | Status: DC | PRN
  Administered 2023-03-22: 4 mg via INTRAVENOUS

## 2023-03-22 SURGICAL SUPPLY — 24 items
CLOTH BEACON ORANGE TIMEOUT ST (SAFETY) ×1 IMPLANT
COVER LIGHT HANDLE STERIS (MISCELLANEOUS) ×3 IMPLANT
DEVICE MYOSURE LITE (MISCELLANEOUS) IMPLANT
DILATOR CANAL MILEX (MISCELLANEOUS) IMPLANT
GAUZE 4X4 16PLY ~~LOC~~+RFID DBL (SPONGE) ×2 IMPLANT
GLOVE BIO SURGEON STRL SZ 6.5 (GLOVE) ×1 IMPLANT
GLOVE BIO SURGEON STRL SZ7 (GLOVE) IMPLANT
GLOVE BIOGEL PI IND STRL 7.0 (GLOVE) ×3 IMPLANT
GOWN STRL REUS W/ TWL LRG LVL3 (GOWN DISPOSABLE) ×1 IMPLANT
GOWN STRL REUS W/TWL LRG LVL3 (GOWN DISPOSABLE) ×2 IMPLANT
IV NS IRRIG 3000ML ARTHROMATIC (IV SOLUTION) ×1 IMPLANT
KIT PROCEDURE FLUENT (KITS) ×1 IMPLANT
KIT TURNOVER CYSTO (KITS) ×1 IMPLANT
KIT TURNOVER KIT A (KITS) ×1 IMPLANT
NS IRRIG 1000ML POUR BTL (IV SOLUTION) ×1 IMPLANT
PACK PERI GYN (CUSTOM PROCEDURE TRAY) ×1 IMPLANT
PAD ARMBOARD 7.5X6 YLW CONV (MISCELLANEOUS) ×1 IMPLANT
PAD TELFA 3X4 1S STER (GAUZE/BANDAGES/DRESSINGS) ×1 IMPLANT
SEAL ROD LENS SCOPE MYOSURE (ABLATOR) ×1 IMPLANT
SET BASIN LINEN APH (SET/KITS/TRAYS/PACK) ×1 IMPLANT
SOL PREP POV-IOD 4OZ 10% (MISCELLANEOUS) ×1 IMPLANT
SYR CONTROL 10ML LL (SYRINGE) ×1 IMPLANT
TOWEL OR 17X26 4PK STRL BLUE (TOWEL DISPOSABLE) ×1 IMPLANT
UNDERPAD 30X36 HEAVY ABSORB (UNDERPADS AND DIAPERS) ×1 IMPLANT

## 2023-03-22 NOTE — Op Note (Signed)
Operative Report  PreOp: 1) Postmenopausal bleeding 2) Thickened endometrium PostOp: same Procedure:  Hysteroscopy, Dilation and Curettage, Myosure resection Surgeon: Dr. Myna Hidalgo Anesthesia: General Complications:none EBL:  10cc IVF:800cc  Discrepancy: 300cc  Findings: 10cm uterus with thickened endometrium, most noticeable posterior uterine wall.  Both ostia visualized.  Specimens: 1) endometrial curettings  Procedure: The patient was taken to the operating room where she underwent general anesthesia without difficulty. The patient was placed in a low lithotomy position using Allen stirrups. She was then prepped and draped in the normal sterile fashion. Sterile speculum was placed.  A single tooth tenaculum was placed on the anterior lip of the cervix. Cervical block was completed using 0.5% lidocaine with epinephrine.  The uterus was then sounded to 10cm. The endocervical canal was then serially dilated using Hank dilators to accommodate the hysteroscopic apparatus.  The hysteroscope was inserted and findings were visualized as noted above.  The hysteroscope was removed and sharp curettage was performed.   The hysteroscope was reinserted- the posterior thickened uterus had not yet been sampled, myosure was used to resect the thickened lining.  The tissue was sent to pathology.   The hysteroscope was removed under direct visualization.  All instrument were then removed. Hemostasis was observed at the cervical site. The patient was repositioned to the supine position. The patient tolerated the procedure without any complications and taken to recovery in stable condition.   Myna Hidalgo, DO Attending Obstetrician & Gynecologist, [facility] for [employer], [facility]

## 2023-03-22 NOTE — Transfer of Care (Signed)
Immediate Anesthesia Transfer of Care Note  Patient: Summer [name]  Procedure(s) Performed: DILATATION AND CURETTAGE /HYSTEROSCOPY WITH MYOSURE (Vagina )  Patient Location: PACU  Anesthesia Type:General  Level of Consciousness: awake and patient cooperative  Airway & Oxygen Therapy: Patient Spontanous Breathing and non-rebreather face mask  Post-op Assessment: Report given to RN and Post -op Vital signs reviewed and stable  Post vital signs: Reviewed and stable  Last Vitals:  Vitals Value Taken Time  BP 152/85 03/22/23 1348  Temp 98.2 03/22/23  1348  Pulse 74 03/22/23 1346  Resp 15 03/22/23 1346  SpO2 98 % 03/22/23 1346  Vitals shown include unvalidated device data.  Last Pain:  Vitals:   03/22/23 1115  PainSc: 0-No pain         Complications: No notable events documented.

## 2023-03-22 NOTE — Anesthesia Postprocedure Evaluation (Signed)
Anesthesia Post Note  Patient: Summer [name]  Procedure(s) Performed: DILATATION AND CURETTAGE /HYSTEROSCOPY WITH MYOSURE (Vagina )  Patient location during evaluation: Phase II Anesthesia Type: General Level of consciousness: awake and alert and oriented Pain management: pain level controlled Vital Signs Assessment: post-procedure vital signs reviewed and stable Respiratory status: spontaneous breathing, nonlabored ventilation and respiratory function stable Cardiovascular status: blood pressure returned to baseline and stable Postop Assessment: no apparent nausea or vomiting Anesthetic complications: no  No notable events documented.   Last Vitals:  Vitals:   03/22/23 1430 03/22/23 1438  BP: 118/81 (!) 133/95  Pulse: 77 76  Resp: 12 15  Temp:  (!) 36.4 C  SpO2: 92% 96%    Last Pain:  Vitals:   03/22/23 1438  TempSrc: Oral  PainSc: 2                  Azizi Bally C Asjia Berrios

## 2023-03-22 NOTE — Anesthesia Procedure Notes (Signed)
Procedure Name: Intubation Date/Time: 03/22/2023 1:00 PM  Performed by: Franco Nones, CRNAPre-anesthesia Checklist: Patient identified, Patient being monitored, Timeout performed, Emergency Drugs available and Suction available Patient Re-evaluated:Patient Re-evaluated prior to induction Oxygen Delivery Method: Circle system utilized Preoxygenation: Pre-oxygenation with 100% oxygen Induction Type: IV induction Ventilation: Mask ventilation without difficulty Laryngoscope Size: Mac and 3 Grade View: Grade I Tube type: Oral Tube size: 7.0 mm Number of attempts: 1 Airway Equipment and Method: Stylet Placement Confirmation: ETT inserted through vocal cords under direct vision, positive ETCO2 and breath sounds checked- equal and bilateral Secured at: 21 cm Tube secured with: Tape Dental Injury: Teeth and Oropharynx as per pre-operative assessment

## 2023-03-22 NOTE — Discharge Instructions (Signed)
HOME INSTRUCTIONS  Please note any unusual or excessive bleeding, pain, swelling. Mild dizziness or drowsiness are normal for about 24 hours after surgery.   Shower when comfortable  Restrictions: No driving for 24 hours or while taking pain medications.  Activity:  Nothing in vagina (no tampons, douching, or intercourse) for ONE WEEK Vaginal spotting is expected but if your bleeding is heavy, period like,  please call the office   Diet:  You may return to your regular diet.  Do not eat large meals.  Eat small frequent meals throughout the day.  Continue to drink a good amount of water at least 6-8 glasses of water per day, hydration is very important for the healing process.  Pain Management: Take over the counter tylenol or ibuprofen as needed for pain.  You can either take one or alternate between the two medications for pain management.  You may also use a heating pack as needed.    Alcohol -- Avoid for 24 hours and while taking pain medications.  Nausea: Take sips of ginger ale or soda  Fever -- Call physician if temperature over 101 degrees  Follow up:  If you experience fever (a temperature greater than 100.4), pain unrelieved by pain medication, shortness of breath, swelling of a single leg, or any other symptoms which are concerning to you please the office immediately.

## 2023-03-22 NOTE — Anesthesia Preprocedure Evaluation (Addendum)
Anesthesia Evaluation  Patient identified by MRN, date of birth, ID band Patient awake    Reviewed: Allergy & Precautions, H&P , NPO status , Patient's Chart, lab work & pertinent test results  Airway Mallampati: II  TM Distance: >3 FB Neck ROM: Full    Dental no notable dental hx. (+) Dental Advisory Given, Teeth Intact   Pulmonary asthma (Mast cell activation syndrome (HCC)) , sleep apnea , COPD,  COPD inhaler, former smoker   Pulmonary exam normal breath sounds clear to auscultation       Cardiovascular hypertension, Pt. on medications + DOE  Normal cardiovascular exam+ dysrhythmias + Valvular Problems/Murmurs MR  Rhythm:Regular Rate:Normal     Neuro/Psych  Neuromuscular disease  negative psych ROS   GI/Hepatic Neg liver ROS,GERD  Medicated,,  Endo/Other  Hypothyroidism  Morbid obesity  Renal/GU negative Renal ROS  negative genitourinary   Musculoskeletal  (+) Arthritis , Osteoarthritis,  Fibromyalgia -  Abdominal   Peds negative pediatric ROS (+)  Hematology negative hematology ROS (+)   Anesthesia Other Findings Mast cell activation syndrome (HCC)  Reproductive/Obstetrics negative OB ROS                             Anesthesia Physical Anesthesia Plan  ASA: 3  Anesthesia Plan: General   Post-op Pain Management: Dilaudid IV   Induction: Intravenous  PONV Risk Score and Plan: 4 or greater and Ondansetron, Dexamethasone and Metaclopromide  Airway Management Planned: Oral ETT  Additional Equipment:   Intra-op Plan:   Post-operative Plan: Extubation in OR  Informed Consent: I have reviewed the patients History and Physical, chart, labs and discussed the procedure including the risks, benefits and alternatives for the proposed anesthesia with the patient or authorized representative who has indicated his/her understanding and acceptance.     Dental advisory given  Plan  Discussed with: CRNA and Surgeon  Anesthesia Plan Comments: (Patient did not take her cromolyn nebulizer treatment today, will give one dose preop)       Anesthesia Quick Evaluation

## 2023-03-23 ENCOUNTER — Telehealth: Payer: Self-pay

## 2023-03-23 NOTE — Telephone Encounter (Signed)
Patient called and stated that she had surgery yesterday with Dr. Charlotta Newton.  Today the patient woke up with her throat hurting really bad. Pt wants a nurse to call her.

## 2023-03-23 NOTE — Telephone Encounter (Signed)
Spoke with patient. States throat feels very sore this am. I advised gargling with warm water, drinking hot tea and chloraseptic spray to help with sore throat. Advised if it worsens to let us know.

## 2023-03-24 LAB — SURGICAL PATHOLOGY

## 2023-03-25 ENCOUNTER — Telehealth: Payer: Self-pay | Admitting: Obstetrics & Gynecology

## 2023-03-25 ENCOUNTER — Encounter (HOSPITAL_COMMUNITY): Payer: Self-pay | Admitting: Obstetrics & Gynecology

## 2023-03-25 NOTE — Telephone Encounter (Signed)
Patient with c/o sore throat that seems to be getting better.  She woke up today with some chest tightness. Has a hx of asthma and has been using her inhaler.  Discussed with Dr Despina Hidden and informed these symptoms are most likely related to the anesthesia.  Advised if chest tightness gets worse or develops SOB, she should go the ED for evaluation.  Pt verbalized understanding.

## 2023-03-25 NOTE — Telephone Encounter (Signed)
Pt states she has a sore throat and her chest is feeling tight. Please advise.

## 2023-04-01 ENCOUNTER — Other Ambulatory Visit: Payer: Self-pay | Admitting: Allergy & Immunology

## 2023-04-01 MED ORDER — CETIRIZINE HCL 10 MG PO TABS: 20.0000 mg | ORAL_TABLET | Freq: Two times a day (BID) | ORAL | 5 refills | Status: DC

## 2023-04-04 ENCOUNTER — Ambulatory Visit: Payer: Medicaid Other

## 2023-04-04 DIAGNOSIS — G4733 Obstructive sleep apnea (adult) (pediatric): Secondary | ICD-10-CM

## 2023-04-06 ENCOUNTER — Telehealth: Payer: Self-pay | Admitting: Pulmonary Disease

## 2023-04-06 NOTE — Telephone Encounter (Signed)
HST showed severe OSA with AHI 40/ hr °Suggest autoCPAP  5-15 cm, mask of choice °OV with me/APP in 6 wks after starting ° °

## 2023-04-11 NOTE — Telephone Encounter (Signed)
ATC patient. LVMTCB. 

## 2023-04-11 NOTE — Telephone Encounter (Signed)
Ret call. Please try again.

## 2023-04-13 NOTE — Telephone Encounter (Signed)
ATC patient. LVMTCB. 

## 2023-04-13 NOTE — Telephone Encounter (Signed)
Spoke to [place] at [place] regarding referral for SIBO test - they tried calling patient several times to schedule, patient didn't return their calls, referral has been closed

## 2023-04-13 NOTE — Telephone Encounter (Signed)
Thanks for the update

## 2023-04-20 ENCOUNTER — Encounter: Payer: Self-pay | Admitting: Allergy & Immunology

## 2023-04-20 ENCOUNTER — Other Ambulatory Visit: Payer: Self-pay

## 2023-04-20 ENCOUNTER — Ambulatory Visit: Payer: Medicaid Other | Admitting: Allergy & Immunology

## 2023-04-20 VITALS — BP 128/88 | HR 90 | Temp 98.4°F | Resp 20 | Wt 314.1 lb

## 2023-04-20 DIAGNOSIS — R0602 Shortness of breath: Secondary | ICD-10-CM

## 2023-04-20 DIAGNOSIS — T7840XD Allergy, unspecified, subsequent encounter: Secondary | ICD-10-CM | POA: Diagnosis not present

## 2023-04-20 DIAGNOSIS — K219 Gastro-esophageal reflux disease without esophagitis: Secondary | ICD-10-CM

## 2023-04-20 DIAGNOSIS — E063 Autoimmune thyroiditis: Secondary | ICD-10-CM

## 2023-04-20 DIAGNOSIS — J4540 Moderate persistent asthma, uncomplicated: Secondary | ICD-10-CM

## 2023-04-20 DIAGNOSIS — R079 Chest pain, unspecified: Secondary | ICD-10-CM

## 2023-04-20 DIAGNOSIS — J3089 Other allergic rhinitis: Secondary | ICD-10-CM

## 2023-04-20 MED ORDER — HYDROXYZINE HCL 25 MG PO TABS: 25.0000 mg | ORAL_TABLET | Freq: Every evening | ORAL | 5 refills | Status: DC

## 2023-04-20 MED ORDER — PREDNISONE 10 MG PO TABS: ORAL_TABLET | ORAL | 0 refills | Status: DC

## 2023-04-20 MED ORDER — CETIRIZINE HCL 10 MG PO TABS: 20.0000 mg | ORAL_TABLET | Freq: Two times a day (BID) | ORAL | 5 refills | Status: DC

## 2023-04-20 MED ORDER — ALBUTEROL SULFATE HFA 108 (90 BASE) MCG/ACT IN AERS: 2.0000 | INHALATION_SPRAY | Freq: Four times a day (QID) | RESPIRATORY_TRACT | 1 refills | Status: DC | PRN

## 2023-04-20 NOTE — Progress Notes (Signed)
FOLLOW UP  Date of Service/Encounter:  04/20/23   Assessment:   Perennial allergic rhinitis (indoor molds)   Allergic reactions - unknown trigger (possible mast cell activation syndrome, although tryptase has never been elevated)   Chronic urticaria - improved with daily antihistamines and montelukast   Shortness of breath - with slowly improving spirometry tracings    Anxiety - has Xanas as needed   Fatigue - likely secondary to obstructive sleep apnea (has not been started on CPAP yet (followed by Dr. Vassie Loll)  Hypothyroidism - with elevated anti-TPO antibodies    Summer [name] seems to be stable from an allergic reaction perspective. She does not meet the laboratory definition of mast  cell activation syndrome since her tryptase has never been positive, but she is responding to MCAS treatment modalities including antihistamines, cromolyn, and lose dose naltrexone. We are certainly treating her in a non-traditional, off label manner, but she seems to be responding to this. I am certain that anxiety has a huge role in this and we discussed getting some kind of therapy.  Hopefully her PCP can help with getting that set up. She is a complicated patient overall and requires a holistic view to provide her with the most encompassing, effective treatment.   Plan/Recommendations:    1. Perennial allergic rhinitis - indoor molds - Continue with: Zyrtec (cetirizine) 10mg  tablet twice daily and Pepcid (famotidine) 40mg  twice daily - You can use an extra dose of the antihistamine, if needed, for breakthrough symptoms.   2. 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 - Start prednisone  taper today to get this flare under control.   3. Shortness of breath - Lung testing looks very stable.  - I agree with starting the CPAP machine.  - We are going to get Mycoplasma titers to see if there is an atypical pneumonia. - We are also getting a chest X-ray.  - Daily controller medication(s): Symbicort 80/4.69mcg two puffs one or two times daily with spacer and cromolyn nebulizer twice daily  - Prior to physical activity: albuterol 2 puffs 10-15 minutes before physical activity. - Rescue medications: albuterol 4 puffs every 4-6 hours as needed and albuterol nebulizer one vial every 4-6 hours as needed - Asthma control goals:  * Full participation in all desired activities (may need albuterol before activity) * Albuterol use two time or less a week on average (not counting use with activity) * Cough interfering with sleep two time or less a month * Oral steroids no more than once a year * No hospitalizations  4. Hashimoto's thyroiditis  - We will refer you to an endocrinologist at [facility] this time.   5. Return in about 4 months (around 08/20/2023).      Subjective:   Summer [name] is a 62 y.o. female presenting today for follow up of  Chief Complaint  Patient presents with   Follow-up    Is having SOB a lot. Even to talk she has SOB. Chest pain- that started 2 weeks ago.     Summer [name] has a history of the following: Patient Active Problem List   Diagnosis Date Noted   Thickened endometrium 03/22/2023   Nonrheumatic mitral valve regurgitation  03/15/2023   OSA (obstructive sleep apnea) 03/07/2023   Moderate persistent asthma 03/07/2023   Gallbladder polyp 02/22/2023   Bloating symptom 02/22/2023   Nausea without vomiting 02/22/2023   Mast cell activation syndrome (HCC) 01/14/2023   Hepatic steatosis 09/20/2022   Hemorrhoids 09/20/2022   Orange stool 08/05/2022   Carpal tunnel syndrome 05/18/2022   Spinal stenosis 05/18/2022   Lower abdominal pain  04/28/2022   Constipation 04/06/2022   DOE (dyspnea on exertion) 01/18/2022   Emphysema lung (HCC) 01/15/2022   Hyperlipidemia 01/15/2022   Elevated hemoglobin (HCC) 01/15/2022   Morbid obesity (HCC) 05/28/2021   LUQ pain 12/17/2020   Rectal bleeding 10/28/2020   Postmenopausal bleeding 08/10/2018   Prediabetes 06/10/2014   Hypothyroidism 06/10/2014   Gastroesophageal reflux disease 04/01/2013   Fibromyalgia 03/22/2013    History obtained from: chart review and patient and her husband .  Summer [name] is a 62 y.o. female presenting for a follow up visit. We last saw her in April 2024. At that time, we continued with cetirizine BID and Pepcid BID. For her allergic reactions, we continued with the antihistamines as well as the PO cromolyn four times daily, hydroxyzine, and low dose naltrexone. For her SOB, we continued with the use of Symbicort as well as albuterol. We referred her to see Endocrinology for history of Hashimoto's thyroiditis. We found someone in [place] for her to see but her insurance was not accepted by them. So we were stuck once again.  Since the last visit, she has been fairly stable. She did get established with Dr. Vassie Loll of Pulmonology.   Asthma/Respiratory Symptom History: She remains on the Symbicort but she is using it only when she is very short of breath. She repors that she has been having a lot of SOB during the last two weeks. She does report some chest pain that results in pain when she breathes.   She was seen by Dr. Vassie Loll.  At that time, it was felt that she might have obstructive sleep apnea.  She had a sleep study in June that showed an AHI of 40 per hour. She is going to be started on a CPAP. Husband uses a CPAP as well. She is 98.4.   She reports that her exhalation is very loud sometimes. She is very worried about this. To demonstrate, she exhales very loudly. Dr. Vassie Loll did not seem concerned about it, per the patient. She is going to see if this improves with  the use of her CPAP. This has not been started yet.   She does report that the cromolyn and albuterol treatments help with her breathing. She is using this more consistently than the Symbicort. She mixes wit with albuterol depending on how she is breathing. She does report some "hypersensitivity" to prednisone and it intermittently helps. She saw Cardiology in May and everything was fine; this was before the chest pain was started. She had a Holter monitor and echocardiogram and EKG; she was told that she has "difficultly relaxing [her] heart".   She thinks that she is "very inflamed" right now. She has noticed that she has had some problems since she had her surgery (D&C which showed thickening of her uterine wall, but was other normal without cancer). She went to have the surgery and she had a reaction to the scent of the plastic mask used for surgery. She had the antihistamines as well on board. She did take those before she went in for her procedure. This was an outpatient procedure  and she went home. She has had the chest pain since that time.   She is using naltrexone 0.75 mg and is working her way up to 1.5 mg. She is paying for this out of pocket. This was not covered by her insurance. She has been on this regimen for a couple of weeks. She has not noticed an improvement at this point. But she knows that this can take several weeks to improve.   She has still been unable to find an endocrinologist to "take her case". She continues to report a lot of anxiety. She has a lot of family issues. She is very anxious about sleeping without her sleep apnea machine.   She shares a lot of frustration today with her medical providers and her interactions with the medical community when she tries to explain that she has a working diagnosis of MCAS. This entire process has been hard for her, but it has certainly been easier since she was able to access Medicaid through recently passed Medicaid expansion.    Otherwise, there have been no changes to her past medical history, surgical history, family history, or social history.    Review of Systems  Constitutional: Negative.  Negative for chills, fever, malaise/fatigue and weight loss.  HENT:  Negative for congestion, ear discharge, ear pain and sinus pain.   Eyes:  Negative for pain, discharge and redness.  Respiratory:  Positive for shortness of breath. Negative for cough, sputum production and wheezing.   Cardiovascular: Negative.  Negative for chest pain and palpitations.  Gastrointestinal:  Negative for abdominal pain, constipation, diarrhea, heartburn, nausea and vomiting.  Skin:  Positive for itching and rash.  Neurological:  Negative for dizziness and headaches.  Endo/Heme/Allergies:  Positive for environmental allergies. Does not bruise/bleed easily.       Objective:   Blood pressure 128/88, pulse 90, temperature 98.4 F (36.9 C), resp. rate 20, weight (!) 314 lb 2 oz (142.5 kg), last menstrual period 01/23/2014, SpO2 97 %. Body mass index is 52.27 kg/m.    Physical Exam Vitals reviewed.  Constitutional:      Appearance: She is well-developed and overweight.     Comments: Much more at ease. Talkative.   HENT:     Head: Normocephalic and atraumatic.     Right Ear: Tympanic membrane, ear canal and external ear normal. No drainage, swelling or tenderness. Tympanic membrane is not injected, scarred, erythematous, retracted or bulging.     Left Ear: Tympanic membrane, ear canal and external ear normal. No drainage, swelling or tenderness. Tympanic membrane is not injected, scarred, erythematous, retracted or bulging.     Nose: No nasal deformity, septal deviation, mucosal edema or rhinorrhea.     Right Nostril: No epistaxis.     Right Turbinates: Enlarged, swollen and pale.     Left Turbinates: Enlarged, swollen and pale.     Right Sinus: No maxillary sinus tenderness or frontal sinus tenderness.     Left Sinus: No  maxillary sinus tenderness or frontal sinus tenderness.     Comments: No polyps noted.     Mouth/Throat:     Mouth: Mucous membranes are not pale and not dry.     Pharynx: Uvula midline.     Comments: Mild cobblestoning.  Eyes:     General:        Right eye: No discharge.        Left eye: No discharge.     Conjunctiva/sclera: Conjunctivae normal.     Right eye: Right  conjunctiva is not injected. No chemosis.    Left eye: Left conjunctiva is not injected. No chemosis.    Pupils: Pupils are equal, round, and reactive to light.  Cardiovascular:     Rate and Rhythm: Normal rate and regular rhythm.     Heart sounds: Normal heart sounds.  Pulmonary:     Effort: Pulmonary effort is normal. No tachypnea, accessory muscle usage or respiratory distress.     Breath sounds: Normal breath sounds. Decreased air movement present. No wheezing, rhonchi or rales.     Comments: Decreased air movement at the bases.  No wheezing or crackles. Chest:     Chest wall: No tenderness.  Abdominal:     Tenderness: There is no abdominal tenderness. There is no guarding or rebound.  Lymphadenopathy:     Head:     Right side of head: No submandibular, tonsillar or occipital adenopathy.     Left side of head: No submandibular, tonsillar or occipital adenopathy.     Cervical: No cervical adenopathy.  Skin:    General: Skin is warm.     Capillary Refill: Capillary refill takes less than 2 seconds.     Coloration: Skin is not pale.     Findings: No abrasion, erythema, petechiae or rash. Rash is not papular, urticarial or vesicular.     Comments: No flushing noted.  Neurological:     Mental Status: She is alert.  Psychiatric:        Behavior: Behavior is cooperative.      Diagnostic studies:    Spirometry: results abnormal (FEV1: 1.74/67%, FVC: 2.07/63%, FEV1/FVC: 84%).    Spirometry consistent with possible restrictive disease.    Allergy Studies: none       Malachi Bonds, MD  [facility]

## 2023-04-20 NOTE — Patient Instructions (Addendum)
1. Perennial allergic rhinitis - indoor molds - Continue with: Zyrtec (cetirizine) 10mg  tablet twice daily and Pepcid (famotidine) 40mg  twice daily - You can use an extra dose of the antihistamine, if needed, for breakthrough symptoms.   2. 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 - Start prednisone taper today to get this flare under control.   3. Shortness of breath - Lung testing looks very stable.  - I agree with starting the CPAP machine.  - We are going to get Mycoplasma titers to see if there is an atypical pneumonia. - We are also getting a chest X-ray.  - Daily controller medication(s): Symbicort 80/4.67mcg two puffs one or two times daily with spacer and cromolyn nebulizer twice daily  - Prior to physical activity: albuterol 2 puffs 10-15 minutes before physical activity. - Rescue medications: albuterol 4 puffs every 4-6 hours as needed and albuterol nebulizer one vial every 4-6 hours as needed - Asthma control goals:  * Full participation in all desired activities (may need albuterol before activity) * Albuterol use two time or less a week on average (not counting use with activity) * Cough interfering with sleep two time or less a month * Oral steroids no more than once a year * No hospitalizations  4. Hashimoto's thyroiditis  - We will refer you to an endocrinologist at [facility] this time.   5. Return in about 4 months (around 08/20/2023).    Please inform us of any Emergency Department visits, hospitalizations, or changes in symptoms. Call us before going to the ED for breathing or allergy symptoms since we might be able to fit you in for a sick visit. Feel free  to contact us anytime with any questions, problems, or concerns.  It was a pleasure to see you again today!  Websites that have reliable patient information: 1. American Academy of Asthma, Allergy, and Immunology: www.aaaai.org 2. Food Allergy Research and Education (FARE): foodallergy.org 3. Mothers of Asthmatics: http://www.asthmacommunitynetwork.org 4. American College of Allergy, Asthma, and Immunology: www.acaai.org   COVID-19 Vaccine Information can be found at: [email or web address].[email or web address] For questions related to vaccine distribution or appointments, please email [email or web address] or call [phone].   We realize that you might be concerned about having an allergic reaction to the COVID19 vaccines. To help with that concern, WE ARE OFFERING THE COVID19 VACCINES IN OUR OFFICE! Ask the front desk for dates!     "Like" [place] on Facebook and Instagram for our latest updates!      A healthy democracy works best when [employer] participate! Make sure you are registered to vote! If you have moved or changed any of your contact information, you will need to get this updated before voting!  In some cases, you MAY be able to register to vote online: [email or web address]

## 2023-04-21 NOTE — Telephone Encounter (Signed)
Patient's referral has been faxed to [facility].   [address] [facility] [address] [place], [place] [address]  Appointments: [phone] Fax: Referrals [phone]  Patient has been informed via mychart.

## 2023-04-22 ENCOUNTER — Ambulatory Visit (HOSPITAL_COMMUNITY)
Admission: RE | Admit: 2023-04-22 | Discharge: 2023-04-22 | Disposition: A | Discharge status: Home / Self Care | Payer: Medicaid Other | Source: Ambulatory Visit | Attending: Family Medicine | Admitting: Family Medicine

## 2023-04-22 DIAGNOSIS — R079 Chest pain, unspecified: Secondary | ICD-10-CM | POA: Diagnosis not present

## 2023-04-22 DIAGNOSIS — R0602 Shortness of breath: Secondary | ICD-10-CM | POA: Diagnosis not present

## 2023-04-22 DIAGNOSIS — R200 Anesthesia of skin: Secondary | ICD-10-CM | POA: Diagnosis not present

## 2023-04-22 DIAGNOSIS — M545 Low back pain, unspecified: Secondary | ICD-10-CM | POA: Insufficient documentation

## 2023-04-22 DIAGNOSIS — M47816 Spondylosis without myelopathy or radiculopathy, lumbar region: Secondary | ICD-10-CM | POA: Diagnosis not present

## 2023-04-22 DIAGNOSIS — E039 Hypothyroidism, unspecified: Secondary | ICD-10-CM | POA: Diagnosis not present

## 2023-04-22 DIAGNOSIS — R059 Cough, unspecified: Secondary | ICD-10-CM | POA: Diagnosis not present

## 2023-04-23 LAB — BASIC METABOLIC PANEL
BUN/Creatinine Ratio: 23 (ref 12–28)
BUN: 17 mg/dL (ref 8–27)
CO2: 17 mmol/L — ABNORMAL LOW (ref 20–29)
Calcium: 9.2 mg/dL (ref 8.7–10.3)
Chloride: 105 mmol/L (ref 96–106)
Creatinine, Ser: 0.74 mg/dL (ref 0.57–1.00)
Glucose: 118 mg/dL — ABNORMAL HIGH (ref 70–99)
Potassium: 4.1 mmol/L (ref 3.5–5.2)
Sodium: 141 mmol/L (ref 134–144)
eGFR: 93 mL/min/{1.73_m2} (ref >59)

## 2023-04-23 LAB — TRYPTASE

## 2023-04-23 LAB — TSH+FREE T4
Free T4: 1.1 ng/dL (ref 0.82–1.77)
TSH: 2.92 u[IU]/mL (ref 0.450–4.500)

## 2023-04-23 LAB — LIPID PANEL
Chol/HDL Ratio: 3.6 ratio (ref 0.0–4.4)
Cholesterol, Total: 175 mg/dL (ref 100–199)
HDL: 49 mg/dL (ref >39)
LDL Chol Calc (NIH): 94 mg/dL (ref 0–99)
Triglycerides: 188 mg/dL — ABNORMAL HIGH (ref 0–149)
VLDL Cholesterol Cal: 32 mg/dL (ref 5–40)

## 2023-04-23 LAB — D-DIMER, QUANTITATIVE: D-DIMER: 0.5 mg/L FEU — ABNORMAL HIGH (ref 0.00–0.49)

## 2023-04-25 LAB — MYCOPLASMA PNEUMONIAE ANTIBODY, IGG

## 2023-04-25 LAB — SEDIMENTATION RATE: Sed Rate: 11 mm/hr (ref 0–40)

## 2023-04-26 LAB — C-REACTIVE PROTEIN: CRP: 5 mg/L (ref 0–10)

## 2023-04-26 LAB — MYCOPLASMA PNEUMONIAE ANTIBODY, IGM: Mycoplasma pneumo IgM: <770 U/mL (ref 0–769)

## 2023-04-27 NOTE — Telephone Encounter (Signed)
Dr Dellis Anes can you checkout the patients MyChart message below.   Thanks

## 2023-04-27 NOTE — Telephone Encounter (Signed)
I sent her a message. Thanks!   Francis Dowse

## 2023-05-11 NOTE — Telephone Encounter (Signed)
Routing to Dee.

## 2023-05-11 NOTE — Telephone Encounter (Signed)
Richard V. With [facility] called. Patient's insurance is out of network with [employer]. Their office is requesting that patient be sent to an office that is in network for patient.

## 2023-05-16 NOTE — Telephone Encounter (Signed)
Call [facility] tomorrow to confirm they have the correct insurance for the patient.

## 2023-05-23 ENCOUNTER — Telehealth: Payer: Self-pay | Admitting: Pulmonary Disease

## 2023-05-24 NOTE — Telephone Encounter (Signed)
I think we may need to trial a biologic. Maybe Tezspire?

## 2023-05-25 DIAGNOSIS — G4733 Obstructive sleep apnea (adult) (pediatric): Secondary | ICD-10-CM

## 2023-05-25 NOTE — Telephone Encounter (Signed)
Per TE dated 04/06/23 office attempted to reach patient several times with no success.   [facility]  MyChart message also sent with results and recommendations.

## 2023-05-26 NOTE — Telephone Encounter (Signed)
Patient Ins denied Tezspire preferred drugs for asthma are Fasenra and Xolair. Please advise

## 2023-05-27 DIAGNOSIS — G4733 Obstructive sleep apnea (adult) (pediatric): Secondary | ICD-10-CM | POA: Diagnosis not present

## 2023-05-30 ENCOUNTER — Other Ambulatory Visit: Payer: Self-pay | Admitting: Allergy & Immunology

## 2023-06-02 NOTE — Telephone Encounter (Signed)
Called patient and advised approval for Fasenra due to [place] not preferred. Patient wants to look into same and will reach out if she decides to proceed with therapy

## 2023-06-13 ENCOUNTER — Encounter (INDEPENDENT_AMBULATORY_CARE_PROVIDER_SITE_OTHER): Payer: Self-pay | Admitting: Gastroenterology

## 2023-06-13 ENCOUNTER — Ambulatory Visit (INDEPENDENT_AMBULATORY_CARE_PROVIDER_SITE_OTHER): Payer: Medicaid Other | Admitting: Gastroenterology

## 2023-06-13 VITALS — BP 142/97 | HR 77 | Temp 97.7°F | Ht 65.0 in | Wt 315.0 lb

## 2023-06-13 DIAGNOSIS — R109 Unspecified abdominal pain: Secondary | ICD-10-CM | POA: Diagnosis not present

## 2023-06-13 DIAGNOSIS — R110 Nausea: Secondary | ICD-10-CM

## 2023-06-13 DIAGNOSIS — K219 Gastro-esophageal reflux disease without esophagitis: Secondary | ICD-10-CM | POA: Diagnosis not present

## 2023-06-13 DIAGNOSIS — R140 Abdominal distension (gaseous): Secondary | ICD-10-CM | POA: Diagnosis not present

## 2023-06-13 DIAGNOSIS — R1310 Dysphagia, unspecified: Secondary | ICD-10-CM | POA: Diagnosis not present

## 2023-06-13 NOTE — Patient Instructions (Signed)
I will look into referral for SIBO testing at [facility], please let me know if you have not heard from them within the next few weeks Let's increase famotidine to 40mg  twice daily Continue with avoiding greasy, spicy, fried, citrus foods, caffeine, carbonated drinks, chocolate and alcohol. can increase reflux symptoms, keep head of bed raised and Stay upright 2-3 hours after eating, prior to lying down and avoid eating late in the evenings. If swallowing issues are worsening, please let me know so that we can proceed with an upper endoscopy for further evaluation  Follow up 6 months

## 2023-06-13 NOTE — Progress Notes (Unsigned)
Referring Provider: Babs Sciara, MD Primary Care Physician:  Babs Sciara, MD Primary GI Physician: Dr. Levon Hedger   Chief Complaint  Patient presents with   Nausea    Follow up on nausea. States she has nausea most days. States never heard back about sibo testing.    HPI:   Summer [name] is a 62 y.o. female with past medical history of  heart failure (normal EF July 2022), Fibromyalgia, Bell's palsy, hypothyroidism, HTN.    Patient presenting today for follow up of nausea, abdominal pain/bloating, GERD/dysphagia  Last seen April 2024, at that time had seen allergist and recently diagnosed with mast cell activation syndrome, on multiple antihistamines to treat. Doing low histamine diet. Appetite up and down, having occasional nausea/bloating, intermittent diarrhea. On episode of rectal bleeding   Recommended low FODMAP diet, probiotic SIBO testing and nutrition referral, repeat US for history of GB polyp.   Notably, [facility] reached out to [place] stating they had tried to contact the patient to schedule SIBO testing but had been unable to reach her  [place] RUQ done 03/04/23 with Similar-appearing 4 mm gallbladder polyp versus focal cholesterolosis. 2. Increased hepatic parenchymal echogenicity suggestive of steatosis. (Repeat in 1 year)  Present: Patient states she never heard from [facility] for SIBO testing  She notes that symptoms have improved some though she is having a lot of nausea still. She is trying to do low histamine diet, getting a lot of lean meats and low histamine veggies, gluten free, rare red meat.  She is concerned about gaining weight. She feels that bloating and abdominal cramping is quite improved with mast cell treatment and anti histamines. States she is currently being treated by her allergist, tried to refer her to a Mast cell specialist but was declined and was unable to be seen at [facility] endocrinology as she was told her case was too complicated.   Feels that  pepcid works better for her GERD than omeprazole did in the past but still having some issues with GERD. Feels GERD worsen with higher stress, sometimes worse at night. She is avoiding eating late and going to lay down. She is sleeping with head elevated and now no longer having regurgitation when lying down though is sometimes waking up with heartburn in the middle of the night. Notably allergist notes states she is to be on 40mg  famotidine BID but she states she is only taking 20mg  BID as she was not aware she was to be on higher dosage.   She ranges between constipation and diarrhea. No rectal bleeding or melena.  Notes stools are still orange in color quite often  She feels she is having some dysphagia multiple times per week with foods feeling slow to travel down. A lot of times she will have to clear her throat or cough the food back up. She denies episodes of food impaction. She has issues with most any food she eats as well as pills. No issues with pills.     Last Colonoscopy:11/24/21- Hemorrhoids found on perianal exam. - Six 3 to 8 mm polyps in the transverse colon, in the ascending colon and in the cecum, - Two 1 to 2 mm polyps in the transverse colon - Medium-sized lipoma in the transverse colon. - Three 3 to 5 mm polyps in the rectum and in the sigmoid colon - The entire examined colon is normal. Biopsied-normal - Internal hemorrhoids.  (total of 9 tubular adenomas, one submucosal lipoma and 1 hyperplastic polyp) Last  Endoscopy:11/24/21 2 cm hiatal hernia. - Normal stomach. Biopsied-normal-negative for h pylori - Normal examined duodenum. Biopsied-normal- no celiac   Recommended repeat Colonoscopy in 3 years   Past Medical History:  Diagnosis Date   Arthritis    Bell's palsy    Chest pain, unspecified    Dysrhythmia, cardiac    Fibromyalgia    Hashimoto's disease    History of CT scan 06/2017   "coronary CT scan on 06/01/2017, this did not find any cardiac calcifications.  Coronary calcium score of zero."   History of Holter monitoring    Hypertension    Mast cell activation syndrome (HCC)    Sleep apnea    Thyroid disease    Urticaria     Past Surgical History:  Procedure Laterality Date   BIOPSY  11/24/2021   Procedure: BIOPSY;  Surgeon: Dolores Frame, MD;  Location: [facility];  Service: Gastroenterology;;   COLONOSCOPY N/A 01/23/2014   Procedure: COLONOSCOPY;  Surgeon: Malissa Hippo, MD;  Location: [facility];  Service: Endoscopy;  Laterality: N/A;  200   COLONOSCOPY WITH PROPOFOL N/A 11/24/2021   Procedure: COLONOSCOPY WITH PROPOFOL;  Surgeon: Dolores Frame, MD;  Location: [facility];  Service: Gastroenterology;  Laterality: N/A;  205   ESOPHAGOGASTRODUODENOSCOPY (EGD) WITH PROPOFOL N/A 11/24/2021   Procedure: ESOPHAGOGASTRODUODENOSCOPY (EGD) WITH PROPOFOL;  Surgeon: Dolores Frame, MD;  [facility]: [facility];  Service: Gastroenterology;  Laterality: N/A;   HYSTEROSCOPY WITH D & C N/A 03/22/2023   Procedure: DILATATION AND CURETTAGE /HYSTEROSCOPY WITH MYOSURE;  Surgeon: Myna Hidalgo, DO;  Location: [facility];  Service: Gynecology;  Laterality: N/A;   POLYPECTOMY  11/24/2021   Procedure: POLYPECTOMY;  Surgeon: Marguerita Merles, Reuel Boom, MD;  Location: [facility];  Service: Gastroenterology;;   TONSILLECTOMY      Current Outpatient Medications  Medication Sig Dispense Refill   albuterol (PROVENTIL) (2.5 MG/3ML) 0.083% nebulizer solution Take 3 mLs (2.5 mg total) by nebulization every 6 (six) hours as needed for wheezing or shortness of breath. 75 mL 1   albuterol (VENTOLIN HFA) 108 (90 Base) MCG/ACT inhaler Inhale 2 puffs into the lungs every 6 (six) hours as needed for wheezing or shortness of breath. 18 g 1   ALPRAZolam (XANAX) 0.5 MG tablet Take 1 tablet (0.5 mg total) by mouth 2 (two) times daily as needed. for anxiety (Patient taking differently: Take 0.5 mg by mouth 2 (two) times daily as needed for  anxiety.) 20 tablet 1   Ascorbic Acid (VITAMIN C) 500 MG CAPS Take 500 mg by mouth daily.     B Complex-C (SUPER B COMPLEX PO) Take 1 tablet by mouth daily at 6 (six) AM.     budesonide-formoterol (SYMBICORT) 80-4.5 MCG/ACT inhaler Inhale 2 puffs twice a day with spacer to help prevent cough and wheeze 1 each 5   cetirizine (ZYRTEC) 10 MG tablet Take 2 tablets (20 mg total) by mouth 2 (two) times daily. 60 tablet 5   chlorhexidine (PERIDEX) 0.12 % solution Use as directed 10 mLs in the mouth or throat 2 (two) times daily. (Patient not taking: Reported on 04/20/2023)     Cholecalciferol (VITAMIN D-3) 5000 UNITS TABS Take 5,000 Units by mouth daily.     cromolyn (GASTROCROM) 100 MG/5ML solution Take 100 mg by mouth 4 (four) times daily -  before meals and at bedtime.     cromolyn (INTAL) 20 MG/2ML nebulizer solution Take 2 mLs (20 mg total) by nebulization in the morning, at  noon, in the evening, and at bedtime. (Patient taking differently: Take 20 mg by nebulization 3 (three) times daily as needed (Asthma mastel activation syndrome).) 120 mL 5   EPINEPHrine 0.3 mg/0.3 mL IJ SOAJ injection Inject 0.3 mg into the muscle as needed for anaphylaxis. 1 each 1   famotidine (PEPCID) 40 MG tablet Take 1 tablet (40 mg total) by mouth 2 (two) times daily. (Patient taking differently: Take 20 mg by mouth 2 (two) times daily.) 60 tablet 5   furosemide (LASIX) 20 MG tablet 1  to 2 qam prn edema (Patient taking differently: Take 40 mg by mouth in the morning.) 60 tablet 5   hydrOXYzine (ATARAX) 25 MG tablet Take 1 tablet (25 mg total) by mouth at bedtime. 60 tablet 5   Magnesium 200 MG TABS Take 200 mg by mouth 2 (two) times daily.     Naltrexone HCl, Pain, 4.5 MG CAPS Take contents of half a capsule nightly for two weeks and then increase to one capsule nightly thereafter. 90 capsule 0   Omega-3 Fatty Acids (FISH OIL) 1200 MG CAPS Take 1,200 mg by mouth daily.     potassium chloride SA (KLOR-CON M) 20 MEQ tablet 1  to 2 qam with lasix (Patient taking differently: Take 20 mEq by mouth daily.) 60 tablet 5   predniSONE (DELTASONE) 10 MG tablet Take two tablets (20mg ) twice daily for three days, then one tablet (10mg ) twice daily for three days, then STOP. 18 tablet 0   Probiotic Product (PROBIOTIC PO) Take 100 mg by mouth daily.     Quercetin 500 MG CAPS Take 500 mg by mouth daily.     RESTASIS 0.05 % ophthalmic emulsion Place 1 drop into both eyes daily.     rosuvastatin (CRESTOR) 5 MG tablet Take 1 tablet (5 mg total) by mouth daily. 30 tablet 5   thyroid (NP THYROID) 60 MG tablet TAKE 1 TABLET BY MOUTH ONCE DAILY BEFORE BREAKFAST 30 tablet 5   No current facility-administered medications for this visit.    Allergies as of 06/13/2023 - Review Complete 06/13/2023  Allergen Reaction Noted   Bee venom Anaphylaxis and Hives 09/06/2012   Molds & smuts Shortness Of Breath 11/20/2021   Shellfish allergy Anaphylaxis and Hives 09/06/2012   Avelox [moxifloxacin hcl in nacl] Other (See Comments) 11/22/2013   Moxifloxacin Other (See Comments) 11/22/2013   Ciprofloxacin Other (See Comments) 01/31/2023   Biaxin [clarithromycin]  11/29/2022   Tape Rash 11/20/2021    Family History  Adopted: Yes  Problem Relation Age of Onset   Cancer Mother        lung   Other Father    Other Daughter        Lyme's disease    Social History   Socioeconomic History   Marital status: Married    Spouse name: Not on file   Number of children: Not on file   Years of education: Not on file   Highest education level: Not on file  Occupational History   Not on file  Tobacco Use   Smoking status: Former    Current packs/day: 0.00    Average packs/day: 1 pack/day for 20.0 years (20.0 ttl pk-yrs)    Types: Cigarettes    Start date: 11/08/1983    Quit date: 11/08/2003    Years since quitting: 19.6    Passive exposure: Current   Smokeless tobacco: Never   Tobacco comments:    quit smoking 10 yrs   Vaping Use  Vaping  status: Never Used  Substance and Sexual Activity   Alcohol use: No   Drug use: No   Sexual activity: Yes    Birth control/protection: Post-menopausal  Other Topics Concern   Not on file  Social History Narrative   Not on file   Social Determinants of Health   Financial Resource Strain: Not on file  Food Insecurity: Not on file  Transportation Needs: Not on file  Physical Activity: Not on file  Stress: Not on file  Social Connections: Not on file    Review of systems General: negative for malaise, night sweats, fever, chills, weight loss Neck: Negative for lumps, goiter, pain and significant neck swelling Resp: Negative for cough, wheezing, dyspnea at rest CV: Negative for chest pain, leg swelling, palpitations, orthopnea GI: denies melena, hematochezia, odyonophagia, early satiety or unintentional weight loss. +nausea +constipation +diarrhea +abdominal pain/bloating +dysphagia +gerd MSK: Negative for joint pain or swelling, back pain, and muscle pain. Derm: Negative for itching or rash Psych: Denies depression, anxiety, memory loss, confusion. No homicidal or suicidal ideation.  Heme: Negative for prolonged bleeding, bruising easily, and swollen nodes. Endocrine: Negative for cold or heat intolerance, polyuria, polydipsia and goiter. Neuro: negative for tremor, gait imbalance, syncope and seizures. The remainder of the review of systems is noncontributory.  Physical Exam: LMP 01/23/2014 Comment: spotting General:   Alert and oriented. No distress noted. Pleasant and cooperative.  Head:  Normocephalic and atraumatic. Eyes:  Conjuctiva clear without scleral icterus. Mouth:  Oral mucosa pink and moist. Good dentition. No lesions. Heart: Normal rate and rhythm, s1 and s2 heart sounds present.  Lungs: Clear lung sounds in all lobes. Respirations equal and unlabored. Abdomen:  +BS, soft, non-tender and non-distended. No rebound or guarding. No HSM or masses noted. Derm: No  palmar erythema or jaundice Msk:  Symmetrical without gross deformities. Normal posture. Extremities:  Without edema. Neurologic:  Alert and  oriented x4 Psych:  Alert and cooperative. Normal mood and affect.  Invalid input(s): "6 MONTHS"   ASSESSMENT: BOYD MURACO is a 62 y.o. female presenting today for follow up of nausea, abdominal pain/bloating and GERD/dysphagia  GERD/Dysphagia: doing better on famotidine 20mg  BID for her mast cell activation syndrome though still having some reflux symptoms. Nothing dysphagia multiple times per week. Appears she is to be on 40mg  famotidine BID for her mast cell disease but she was not aware she was to be on higher dosage. As she feels famotidine is providing some relief for her GERD, recommend increasing famotidine to 40mg  BID to see if symptoms improve. Discussed proceeding with EGD given her dysphagia, however, she would like to hold off on this to see if increased in H2B improves her symptoms, she will make me aware if dysphagia is not improving or she has worsening symptoms.  Abdominal pain/bloating/nausea: ongoing abdominal pain, nausea and bloating though better since doing low histamine diet and on treatment for Mast Cell activation. EGD in 2023 was negative for celiacs/H pylori, she was referred for SIBO testing at last OV but states she never heard from [facility]. Suspect symptoms are likely secondary to IBS and may be influenced by her Mast Cell disease, however, would recommend SIBO testing to rule this out as an underlying contributor. Will refer her to [facility] to have this done.   UltraSound done October 2023 with small GB polyp, she had repeat US in May 2024 with similar appearing 4mm lesion, polyp vs focal cholesterolosis, some hepatic steatosis. Recommend repeat US in 1  year to follow for changes in GB lesion. In regards to fatty liver, She is trying to manage her weight as best she can but notes due to her Hashimoto's disease this has been  difficult. She is awaiting referral to Endocrinologist for further management of this.   PLAN:  Refer to [facility] for SIBO testing  2.  Increase Pepcid 40mg  BID   3. GB lesion re evaluation via [place] in May 2025 4.  Pt to let me know if dysphagia worsens, can schedule EGD if needed  5. Good reflux precautions  All questions were answered, patient verbalized understanding and is in agreement with plan as outlined above.   Follow Up: 6 months   Jarrett Albor L. Jeanmarie Hubert, MSN, APRN, AGNP-C Adult-Gerontology Nurse Practitioner [facility]  I have reviewed the note and agree with the APP's assessment as described in this progress note  Katrinka Blazing, MD Gastroenterology and Hepatology [facility]

## 2023-06-15 ENCOUNTER — Telehealth (INDEPENDENT_AMBULATORY_CARE_PROVIDER_SITE_OTHER): Payer: Self-pay | Admitting: *Deleted

## 2023-06-15 NOTE — Telephone Encounter (Signed)
Per Chelsea's note from 06/14/23, resend referral to [facility] for SIBO test. I called and spoke to [facility] in GI Dept and they can use previous referral, I gave updated phone# for MS Lasater and they will reach out to her to schedule. I did call Ms Villavicencio and left her a message advising they will be calling to look for their number

## 2023-06-17 ENCOUNTER — Ambulatory Visit: Payer: Medicaid Other | Admitting: Family Medicine

## 2023-07-03 ENCOUNTER — Other Ambulatory Visit: Payer: Self-pay | Admitting: Family Medicine

## 2023-07-03 ENCOUNTER — Other Ambulatory Visit: Payer: Self-pay | Admitting: Allergy & Immunology

## 2023-07-05 DIAGNOSIS — G4733 Obstructive sleep apnea (adult) (pediatric): Secondary | ICD-10-CM | POA: Diagnosis not present

## 2023-07-06 ENCOUNTER — Other Ambulatory Visit: Payer: Self-pay | Admitting: Allergy & Immunology

## 2023-07-08 ENCOUNTER — Other Ambulatory Visit (HOSPITAL_COMMUNITY): Payer: Self-pay

## 2023-07-31 ENCOUNTER — Other Ambulatory Visit: Payer: Self-pay | Admitting: Allergy & Immunology

## 2023-08-02 DIAGNOSIS — G4733 Obstructive sleep apnea (adult) (pediatric): Secondary | ICD-10-CM | POA: Diagnosis not present

## 2023-08-03 ENCOUNTER — Ambulatory Visit: Payer: Medicaid Other | Admitting: Family Medicine

## 2023-08-03 VITALS — BP 140/88 | HR 88 | Temp 97.8°F | Ht 65.0 in | Wt 305.2 lb

## 2023-08-03 DIAGNOSIS — R0609 Other forms of dyspnea: Secondary | ICD-10-CM

## 2023-08-03 DIAGNOSIS — R252 Cramp and spasm: Secondary | ICD-10-CM | POA: Diagnosis not present

## 2023-08-03 DIAGNOSIS — F411 Generalized anxiety disorder: Secondary | ICD-10-CM | POA: Diagnosis not present

## 2023-08-03 DIAGNOSIS — I5032 Chronic diastolic (congestive) heart failure: Secondary | ICD-10-CM | POA: Diagnosis not present

## 2023-08-03 MED ORDER — SUCRALFATE 1 G PO TABS: ORAL_TABLET | ORAL | 1 refills | Status: DC

## 2023-08-03 MED ORDER — CITALOPRAM HYDROBROMIDE 20 MG PO TABS: 20.0000 mg | ORAL_TABLET | Freq: Every day | ORAL | 5 refills | Status: DC

## 2023-08-03 NOTE — Progress Notes (Signed)
Subjective:    Patient ID: Summer [name], female    DOB: 1961-02-11, 62 y.o.   MRN: [id number]  Discussed the use of AI scribe software for clinical note transcription with the patient, who gave verbal consent to proceed.  History of Present Illness   The patient, with a history of reflux and anxiety, presents with a week-long illness characterized by chest pain, flu-like symptoms, and increased anxiety. The chest pain began suddenly after a late-night snack, intensifying to the point of considering an emergency room visit. The pain eased overnight but worsened the following day, causing significant distress. The patient also reports feeling achy, weak, and dizzy, with these symptoms worsening as the day progresses.  The patient's reflux has been problematic, with a raw throat and chest pain that she believes is digestive in nature. She is currently on medication for reflux, which provides some relief. However, the patient's anxiety has been flaring up, exacerbating her physical symptoms. She has been unable to find a counseling service that accepts her insurance.  In addition to these symptoms, the patient reports that her joints, particularly in her fingers, have been locking up intermittently over the past two months. She also notes that her ankles have been swelling. Despite these symptoms, the patient has been trying to maintain a bland diet due to her digestive issues.  The patient's symptoms seem to improve slightly with rest and the use of aloe vera juice for belching. She is awaiting a test for SIBO but has been unable to schedule it. The patient expresses concern about adding more medication to her regimen but is open to the possibility if it could help manage her stress and improve her physical symptoms.         Review of Systems     Objective:    Physical Exam   CHEST: Lungs clear to auscultation. CARDIOVASCULAR: Heart sounds normal on auscultation. EXTREMITIES: Ankles  without pitting edema; patient reported transient indentation on foot suggesting intermittent pitting edema.           Assessment & Plan:  Assessment and Plan    Chest Pain Recent onset of severe chest pain, possibly related to gastroesophageal reflux disease (GERD). Patient reports relief with belching and aloe vera juice. -Start Sulcrafate three times a day to coat the esophagus and potentially alleviate symptoms.  Gastroesophageal Reflux Disease (GERD) Patient reports worsening reflux symptoms, including a raw throat. Currently taking Pepcid with some relief. -Continue Pepcid as prescribed. -Add Sulcrafate three times a day to help coat the esophagus and potentially alleviate symptoms.  Anxiety Patient reports significant anxiety, which may be contributing to physical symptoms. Difficulty finding counseling services that accept Medicaid. Currently taking Xanax sparingly. -Consider starting an antidepressant to help manage anxiety symptoms. -Continue to seek counseling services that accept Medicaid.  Joint Locking Patient reports occasional locking of thumb and other fingers over the past two months. -Encourage massage and flexing of affected joints. -Consider evaluation of electrolyte levels.  Follow-up in 6-8 weeks to assess response to treatment and ongoing symptoms.      1. Cramps of left lower extremity We will check lab work What she describes as cramps in the hand where the fingers close and she has to manually open them up but it does not sound like trigger finger because it happens with all fingers we will check electrolytes stretching exercises were shown to the patient - Basic Metabolic Panel - Magnesium  2. Diastolic CHF, chronic (HCC) Stable currently on multiple  medications.  I do not feel this is causing her current shortness of breath  3. DOE (dyspnea on exertion) This is multifactorial including significant underlying heart disease lung disease as well as  deconditioning morbid obesity and restrictive lung disease due to morbid obesity  4. GAD (generalized anxiety disorder) I believe the physical ailments she is having is making her emotional ailments worse and vice versa I think it is reasonable for her to start Celexa half a tablet daily for the first week then 1 tablet thereafter follow-up in 6 weeks patient would also benefit from counseling although this may be difficult to find

## 2023-08-04 DIAGNOSIS — G4733 Obstructive sleep apnea (adult) (pediatric): Secondary | ICD-10-CM | POA: Diagnosis not present

## 2023-08-04 LAB — MAGNESIUM: Magnesium: 2.1 mg/dL (ref 1.6–2.3)

## 2023-08-04 LAB — BASIC METABOLIC PANEL
BUN/Creatinine Ratio: 14 (ref 12–28)
BUN: 11 mg/dL (ref 8–27)
CO2: 21 mmol/L (ref 20–29)
Calcium: 9.4 mg/dL (ref 8.7–10.3)
Chloride: 103 mmol/L (ref 96–106)
Creatinine, Ser: 0.8 mg/dL (ref 0.57–1.00)
Glucose: 119 mg/dL — ABNORMAL HIGH (ref 70–99)
Potassium: 3.9 mmol/L (ref 3.5–5.2)
Sodium: 142 mmol/L (ref 134–144)
eGFR: 84 mL/min/{1.73_m2} (ref >59)

## 2023-08-08 ENCOUNTER — Other Ambulatory Visit: Payer: Self-pay

## 2023-08-08 DIAGNOSIS — F411 Generalized anxiety disorder: Secondary | ICD-10-CM

## 2023-08-12 ENCOUNTER — Inpatient Hospital Stay: Payer: Medicaid Other | Attending: Hematology

## 2023-08-12 DIAGNOSIS — I10 Essential (primary) hypertension: Secondary | ICD-10-CM | POA: Diagnosis not present

## 2023-08-12 DIAGNOSIS — R202 Paresthesia of skin: Secondary | ICD-10-CM | POA: Diagnosis not present

## 2023-08-12 DIAGNOSIS — F419 Anxiety disorder, unspecified: Secondary | ICD-10-CM | POA: Insufficient documentation

## 2023-08-12 DIAGNOSIS — K625 Hemorrhage of anus and rectum: Secondary | ICD-10-CM | POA: Diagnosis not present

## 2023-08-12 DIAGNOSIS — N939 Abnormal uterine and vaginal bleeding, unspecified: Secondary | ICD-10-CM | POA: Insufficient documentation

## 2023-08-12 DIAGNOSIS — Z87891 Personal history of nicotine dependence: Secondary | ICD-10-CM | POA: Insufficient documentation

## 2023-08-12 DIAGNOSIS — D582 Other hemoglobinopathies: Secondary | ICD-10-CM

## 2023-08-12 DIAGNOSIS — D8940 Mast cell activation, unspecified: Secondary | ICD-10-CM | POA: Diagnosis not present

## 2023-08-12 DIAGNOSIS — G4730 Sleep apnea, unspecified: Secondary | ICD-10-CM | POA: Insufficient documentation

## 2023-08-12 DIAGNOSIS — J45909 Unspecified asthma, uncomplicated: Secondary | ICD-10-CM | POA: Insufficient documentation

## 2023-08-12 DIAGNOSIS — R0602 Shortness of breath: Secondary | ICD-10-CM | POA: Insufficient documentation

## 2023-08-12 DIAGNOSIS — R197 Diarrhea, unspecified: Secondary | ICD-10-CM | POA: Insufficient documentation

## 2023-08-12 DIAGNOSIS — K219 Gastro-esophageal reflux disease without esophagitis: Secondary | ICD-10-CM | POA: Insufficient documentation

## 2023-08-12 DIAGNOSIS — M797 Fibromyalgia: Secondary | ICD-10-CM | POA: Insufficient documentation

## 2023-08-12 DIAGNOSIS — D751 Secondary polycythemia: Secondary | ICD-10-CM | POA: Insufficient documentation

## 2023-08-12 LAB — CBC WITH DIFFERENTIAL/PLATELET
Abs Immature Granulocytes: 0.01 10*3/uL (ref 0.00–0.07)
Basophils Absolute: 0.1 10*3/uL (ref 0.0–0.1)
Basophils Relative: 1 %
Eosinophils Absolute: 0.3 10*3/uL (ref 0.0–0.5)
Eosinophils Relative: 4 %
HCT: 50.6 % — ABNORMAL HIGH (ref 36.0–46.0)
Hemoglobin: 16.7 g/dL — ABNORMAL HIGH (ref 12.0–15.0)
Immature Granulocytes: 0 %
Lymphocytes Relative: 39 %
Lymphs Abs: 2.9 10*3/uL (ref 0.7–4.0)
MCH: 29.8 pg (ref 26.0–34.0)
MCHC: 33 g/dL (ref 30.0–36.0)
MCV: 90.4 fL (ref 80.0–100.0)
Monocytes Absolute: 0.7 10*3/uL (ref 0.1–1.0)
Monocytes Relative: 9 %
Neutro Abs: 3.5 10*3/uL (ref 1.7–7.7)
Neutrophils Relative %: 47 %
Platelets: 247 10*3/uL (ref 150–400)
RBC: 5.6 MIL/uL — ABNORMAL HIGH (ref 3.87–5.11)
RDW: 15.3 % (ref 11.5–15.5)
WBC: 7.5 10*3/uL (ref 4.0–10.5)
nRBC: 0 % (ref 0.0–0.2)

## 2023-08-15 LAB — CARBON MONOXIDE, BLOOD (PERFORMED AT REF LAB): Carbon Monoxide, Blood: 2.1 % (ref 0.0–3.6)

## 2023-08-19 ENCOUNTER — Inpatient Hospital Stay (HOSPITAL_BASED_OUTPATIENT_CLINIC_OR_DEPARTMENT_OTHER): Payer: Medicaid Other | Admitting: Oncology

## 2023-08-19 VITALS — BP 162/98 | HR 83 | Temp 98.1°F | Resp 18 | Wt 314.8 lb

## 2023-08-19 DIAGNOSIS — D582 Other hemoglobinopathies: Secondary | ICD-10-CM | POA: Diagnosis not present

## 2023-08-19 DIAGNOSIS — D751 Secondary polycythemia: Secondary | ICD-10-CM | POA: Diagnosis not present

## 2023-08-19 NOTE — Progress Notes (Unsigned)
[facility] [address], [place] [address]   CLINIC:  Medical Oncology/Hematology  PCP:  Babs Sciara, MD [address] [place] [address] [phone]   REASON FOR VISIT:  Follow-up for erythrocytosis  PRIOR THERAPY: None  CURRENT THERAPY: Surveillance   INTERVAL HISTORY:   Ms. Summer [name] 62 y.o. female returns for routine follow-up of erythrocytosis.    At today's visit, she reports feeling stable.  Appetite 60% energy levels 40%.  Has chronic joint pain in her back 7 out of 10 pain.  Has diarrhea/nausea intermittently along with shortness of breath and chest pain.  She had an hysteroscopy endometrial biopsy due to postmenopausal bleeding which did not reveal any evidence of hyperplasia or atypia of malignancy.  Had a sleep study which completed severe sleep apnea requiring a CPAP.Having trouble sleeping with it. Is using it 4 hours per day. Using   Starting walking. Not eating a lot and gaining weight. Up 4 lbs this visit.   Taking pepcid 20 mg in the am and 40 mg in the pm.   Had 24 hours of hematuria which stopped on its own. Has seen urologist.   Has not been eating or drinking well.   Having some balance issues. Using a cane. Unsure of why. Trying to get disability and can't get it approve.    She denies any history of DVT or PE.  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  She also reports that her toes get purple when exposed to cold.  She reports occasional "pins-and-needles" paresthesias in the fingers and toes.  She has intermittent dizziness and blurry vision.  No B symptoms.  She is a former smoker, quit 15+ years ago.  She has been referred for sleep study due to excessive daytime sleepiness and waking up at night gasping for breath.  She takes Lasix for the past 2 years.  She has gas heating in her apartment, but does not know if she has a carbon  monoxide monitor.  She reports history of asthma and is being seen by cardiologist for intermittent chest pain and shortness of breath.  She does not have any family history of MPN.    She has 50% energy and 75% appetite. She endorses that she is maintaining a stable weight.   ASSESSMENT & PLAN:  1.  Erythrocytosis - Patient seen at the request of Dr. Gerda Diss. - She had elevated hemoglobin, RBC count and HCT consistently since December 2018. - CT renal study (12/25/2022): Normal spleen and liver.  Right renal cyst noted. - Uses Symbicort inhaler for 6 months and Lasix for 2 years. - Reports history of asthma.  Is being worked up by cardiology for shortness of breath and intermittent chest pain. - Reports gas heat in her apartment.  No carbon monoxide monitor. - No family history of MPN. - Former smoker (quit 15+ years ago) - Sleep study pending due to daytime somnolence and waking at night gasping for breath  - Reports occasional aquagenic pruritus as well as general pruritus (mast cell activation disorder).  Toes turn purple and cold.  Paresthesias in fingers and toes. - Hematology workup (12/31/2022): Negative for JAK2, CALR, MPL, Exons 12-15 mutations. Normal erythropoietin 13.3. -Most recent CBC (02/04/2023): Hgb 15.0/HCT 46.3% - DIFFERENTIAL DIAGNOSIS favors erythrocytosis secondary to suspected sleep apnea, possibly worsened by hemoconcentration from Lasix.  No evidence of MPN or clonal erythrocytosis at this time. -  Sleep study concluded severe sleep apnea and it was recommended she try a CPAP. -Repeat labs from 08/12/2023 show hemoglobin of 16.7 with hematocrit of 50.6.  Differential is normal. -Treatment for secondary erythrocytosis is aimed at underlying cause which in her case would be sleep apnea. -Recommend consistent use of CPAP at night. - PLAN: Continue follow-up with PCP for possible OSA.  Discussed that treatment of secondary erythrocytosis is aimed at the underlying cause and  that diagnosis of OSA and use of CPAP would likely improve her blood counts. - Repeat CBC with office visit in 6 months.  We will also check carbon monoxide levels.  2.  Bleeding episodes with easy bruising - Patient reports easy bruising and bleeding - She reports "bleeding episodes" involving vaginal, urethral and rectal bleeding lasting between 5 days and 2 weeks about 2 times per year for the last 3 years. - She had tonsillectomy at age 40 and wisdom teeth removed after high school with no extra bleeding complications. - Follows with GYN (Dr. Charlotta Newton) for abnormal postmenopausal vaginal bleeding, with plans for upcoming hysteroscopy with D&C - Follows with urology for hematuria  - Follows with GI for rectal bleeding  - Hematology workup (12/31/2022): Normal PT/INR and APTT Normal fibrinogen Von Willebrand panel negative. - PLAN: No evidence of primary hematologic bleeding disorder.  Continue follow-up with specialists above.  3.  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 - Quit smoking 15 years ago.  Smoked 1 pack/day for 20 years. - No family history of polycythemia vera.  Biological mother died of small cell lung cancer.  Patient was adopted and her knowledge of biological family is limited.   PLAN SUMMARY: >> Labs in 6 months = CBC/D, carbon monoxide >> OFFICE visit in 6 months (1 week after labs)     REVIEW OF SYSTEMS:   Review of Systems  Constitutional:  Positive for fatigue. Negative for appetite change, chills, diaphoresis, fever and unexpected weight change.  HENT:   Negative for lump/mass and nosebleeds.   Eyes:  Negative for eye problems.  Respiratory:  Positive for cough and shortness of breath (at times). Negative for hemoptysis.   Cardiovascular:  Positive for chest pain  (none today) and palpitations. Negative for leg swelling.  Gastrointestinal:  Positive for nausea and vomiting. Negative for abdominal pain, blood in stool, constipation and diarrhea.  Genitourinary:  Negative for hematuria.   Musculoskeletal:  Positive for arthralgias.  Skin: Negative.   Neurological:  Positive for dizziness, headaches and numbness. Negative for light-headedness.  Hematological:  Bruises/bleeds easily.  Psychiatric/Behavioral:  Positive for sleep disturbance. The patient is nervous/anxious.      PHYSICAL EXAM:  ECOG PERFORMANCE STATUS: 1 - Symptomatic but completely ambulatory  Vitals:   08/19/23 0940  BP: (!) 162/98  Pulse: 83  Resp: 18  Temp: 98.1 F (36.7 C)  SpO2: 95%   Filed Weights   08/19/23 0940  Weight: (!) 314 lb 12.8 oz (142.8 kg)   Physical Exam Constitutional:      Appearance: Normal appearance. She is morbidly obese.  Cardiovascular:     Heart sounds: Normal heart sounds.  Pulmonary:     Breath sounds: Normal breath sounds.  Neurological:     General: No focal deficit present.     Mental Status: Mental status is at baseline.  Psychiatric:  Behavior: Behavior normal. Behavior is cooperative.    PAST MEDICAL/SURGICAL HISTORY:  Past Medical History:  Diagnosis Date   Arthritis    Bell's palsy    Chest pain, unspecified    Dysrhythmia, cardiac    Fibromyalgia    Hashimoto's disease    History of CT scan 06/2017   "coronary CT scan on 06/01/2017, this did not find any cardiac calcifications. Coronary calcium score of zero."   History of Holter monitoring    Hypertension    Mast cell activation syndrome (HCC)    Sleep apnea    Thyroid disease    Urticaria    Past Surgical History:  Procedure Laterality Date   BIOPSY  11/24/2021   Procedure: BIOPSY;  Surgeon: Dolores Frame, MD;  Location: [facility];  Service: Gastroenterology;;   COLONOSCOPY N/A 01/23/2014   Procedure: COLONOSCOPY;  Surgeon: Malissa Hippo,  MD;  Location: [facility];  Service: Endoscopy;  Laterality: N/A;  200   COLONOSCOPY WITH PROPOFOL N/A 11/24/2021   Procedure: COLONOSCOPY WITH PROPOFOL;  Surgeon: Dolores Frame, MD;  Location: [facility];  Service: Gastroenterology;  Laterality: N/A;  205   ESOPHAGOGASTRODUODENOSCOPY (EGD) WITH PROPOFOL N/A 11/24/2021   Procedure: ESOPHAGOGASTRODUODENOSCOPY (EGD) WITH PROPOFOL;  Surgeon: Dolores Frame, MD;  Location: [facility];  Service: Gastroenterology;  Laterality: N/A;   HYSTEROSCOPY WITH D & C N/A 03/22/2023   Procedure: DILATATION AND CURETTAGE /HYSTEROSCOPY WITH MYOSURE;  Surgeon: Myna Hidalgo, DO;  Location: [facility];  Service: Gynecology;  Laterality: N/A;   POLYPECTOMY  11/24/2021   Procedure: POLYPECTOMY;  Surgeon: Marguerita Merles, Reuel Boom, MD;  Location: [facility];  Service: Gastroenterology;;   TONSILLECTOMY      SOCIAL HISTORY:  Social History   Socioeconomic History   Marital status: Married    Spouse name: Not on file   Number of children: Not on file   Years of education: Not on file   Highest education level: 12th grade  Occupational History   Not on file  Tobacco Use   Smoking status: Former    Current packs/day: 0.00    Average packs/day: 1 pack/day for 20.0 years (20.0 ttl pk-yrs)    Types: Cigarettes    Start date: 11/08/1983    Quit date: 11/08/2003    Years since quitting: 19.7    Passive exposure: Current   Smokeless tobacco: Never   Tobacco comments:    quit smoking 10 yrs   Vaping Use   Vaping status: Never Used  Substance and Sexual Activity   Alcohol use: No   Drug use: No   Sexual activity: Yes    Birth control/protection: Post-menopausal  Other Topics Concern   Not on file  Social History Narrative   Not on file   Social Determinants of Health   Financial Resource Strain: High Risk (08/03/2023)   Overall Financial Resource Strain (CARDIA)    Difficulty of Paying Living Expenses: Very hard  Food  Insecurity: Food Insecurity Present (08/03/2023)   Hunger Vital Sign    Worried About Running Out of Food in the Last Year: Often true    Ran Out of Food in the Last Year: Often true  Transportation Needs: No Transportation Needs (08/03/2023)   PRAPARE - Administrator, Civil Service (Medical): No    Lack of Transportation (Non-Medical): No  Physical Activity: Unknown (08/03/2023)   Exercise Vital Sign    Days of Exercise per Week: 0 days    Minutes  of Exercise per Session: Not on file  Stress: Stress Concern Present (08/03/2023)   [employer] of Occupational Health - Occupational Stress Questionnaire    Feeling of Stress : Very much  Social Connections: Moderately Isolated (08/03/2023)   Social Connection and Isolation Panel [NHANES]    Frequency of Communication with Friends and Family: Three times a week    Frequency of Social Gatherings with Friends and Family: Never    Attends Religious Services: Never    Database administrator or Organizations: No    Attends Engineer, structural: Not on file    Marital Status: Married  Catering manager Violence: Not on file    FAMILY HISTORY:  Family History  Adopted: Yes  Problem Relation Age of Onset   Cancer Mother        lung   Other Father    Other Daughter        Lyme's disease    CURRENT MEDICATIONS:  Outpatient Encounter Medications as of 08/19/2023  Medication Sig Note   albuterol (PROVENTIL) (2.5 MG/3ML) 0.083% nebulizer solution USE 1 VIAL IN NEBULIZER EVERY 6 HOURS AS NEEDED FOR WHEEZING FOR SHORTNESS OF BREATH    ALPRAZolam (XANAX) 0.5 MG tablet Take 1 tablet by mouth twice daily as needed for anxiety    Ascorbic Acid (VITAMIN C) 500 MG CAPS Take 500 mg by mouth daily.    B Complex-C (SUPER B COMPLEX PO) Take 1 tablet by mouth daily at 6 (six) AM.    budesonide-formoterol (SYMBICORT) 80-4.5 MCG/ACT inhaler Inhale 2 puffs twice a day with spacer to help prevent cough and wheeze    cetirizine (ZYRTEC)  10 MG tablet Take 2 tablets (20 mg total) by mouth 2 (two) times daily.    Cholecalciferol (VITAMIN D-3) 5000 UNITS TABS Take 5,000 Units by mouth daily.    citalopram (CELEXA) 20 MG tablet Take 1 tablet (20 mg total) by mouth daily.    cromolyn (GASTROCROM) 100 MG/5ML solution TAKE 5 ML BY MOUTH  4 TIMES DAILY( BEFORE MEAL(S) AND AT BEDTIME)    cromolyn (INTAL) 20 MG/2ML nebulizer solution Take 2 mLs (20 mg total) by nebulization in the morning, at noon, in the evening, and at bedtime. (Patient taking differently: Take 20 mg by nebulization 3 (three) times daily as needed (Asthma mastel activation syndrome).)    EPINEPHrine 0.3 mg/0.3 mL IJ SOAJ injection Inject 0.3 mg into the muscle as needed for anaphylaxis.    furosemide (LASIX) 20 MG tablet 1  to 2 qam prn edema (Patient taking differently: Take 40 mg by mouth in the morning.)    hydrOXYzine (ATARAX) 25 MG tablet Take 1 tablet (25 mg total) by mouth at bedtime.    Magnesium 200 MG TABS Take 200 mg by mouth 2 (two) times daily.    Naltrexone HCl, Pain, 4.5 MG CAPS Take contents of half a capsule nightly for two weeks and then increase to one capsule nightly thereafter.    Omega-3 Fatty Acids (FISH OIL) 1200 MG CAPS Take 1,200 mg by mouth daily.    potassium chloride SA (KLOR-CON M) 20 MEQ tablet 1 to 2 qam with lasix (Patient taking differently: Take 20 mEq by mouth daily.)    Probiotic Product (PROBIOTIC PO) Take 100 mg by mouth daily.    Quercetin 500 MG CAPS Take 500 mg by mouth daily.    RESTASIS 0.05 % ophthalmic emulsion Place 1 drop into both eyes daily.    rosuvastatin (CRESTOR) 5 MG tablet Take 1  tablet (5 mg total) by mouth daily.    sucralfate (CARAFATE) 1 g tablet Crush and put into 30 ml water use tid    thyroid (NP THYROID) 60 MG tablet TAKE 1 TABLET BY MOUTH ONCE DAILY BEFORE BREAKFAST    VENTOLIN HFA 108 (90 Base) MCG/ACT inhaler INHALE 2 PUFFS BY MOUTH EVERY 6 HOURS AS NEEDED FOR WHEEZING OR SHORTNESS OF BREATH    famotidine  (PEPCID) 40 MG tablet Take 1 tablet (40 mg total) by mouth 2 (two) times daily. (Patient taking differently: Take 20 mg by mouth 2 (two) times daily.) 02/22/2023: 20 mg twice a day    No facility-administered encounter medications on file as of 08/19/2023.    ALLERGIES:  Allergies  Allergen Reactions   Bee Venom Anaphylaxis and Hives   Molds & Smuts Shortness Of Breath   Shellfish Allergy Anaphylaxis and Hives   Avelox [Moxifloxacin Hcl In Nacl] Other (See Comments)    "heart beats weird"   Moxifloxacin Other (See Comments)    "heart beats weird"   Ciprofloxacin Other (See Comments)    "heart beats weird"   Biaxin [Clarithromycin]     Nausea and heart palpitations   Tape Rash    LABORATORY DATA:  I have reviewed the labs as listed.  CBC    Component Value Date/Time   WBC 7.5 08/12/2023 1202   RBC 5.60 (H) 08/12/2023 1202   HGB 16.7 (H) 08/12/2023 1202   HGB 16.5 (H) 12/23/2021 1612   HCT 50.6 (H) 08/12/2023 1202   HCT 49.4 (H) 12/23/2021 1612   PLT 247 08/12/2023 1202   PLT 277 10/30/2020 1521   MCV 90.4 08/12/2023 1202   MCV 90 12/23/2021 1612   MCH 29.8 08/12/2023 1202   MCHC 33.0 08/12/2023 1202   RDW 15.3 08/12/2023 1202   RDW 14.0 12/23/2021 1612   LYMPHSABS 2.9 08/12/2023 1202   LYMPHSABS 2.8 12/23/2021 1612   MONOABS 0.7 08/12/2023 1202   EOSABS 0.3 08/12/2023 1202   EOSABS 0.2 12/23/2021 1612   BASOSABS 0.1 08/12/2023 1202   BASOSABS 0.1 12/23/2021 1612      Latest Ref Rng & Units 08/03/2023   12:05 PM 04/22/2023   11:16 AM 02/04/2023    9:16 AM  CMP  Glucose 70 - 99 mg/dL 244  010  272   BUN 8 - 27 mg/dL 11  17  15    Creatinine 0.57 - 1.00 mg/dL 5.36  6.44  0.34   Sodium 134 - 144 mmol/L 142  141  138   Potassium 3.5 - 5.2 mmol/L 3.9  4.1  3.6   Chloride 96 - 106 mmol/L 103  105  105   CO2 20 - 29 mmol/L 21  17  25    Calcium 8.7 - 10.3 mg/dL 9.4  9.2  8.8     DIAGNOSTIC IMAGING:  I have independently reviewed the relevant imaging and discussed  with the patient.   WRAP UP:  All questions were answered. The patient knows to call the clinic with any problems, questions or concerns.  Medical decision making: Moderate  Time spent on visit: I spent 20 minutes counseling the patient face to face. The total time spent in the appointment was 30 minutes and more than 50% was on counseling.  Mauro Kaufmann, NP  02/09/2023 3:09 PM

## 2023-08-21 ENCOUNTER — Other Ambulatory Visit: Payer: Self-pay | Admitting: Allergy & Immunology

## 2023-09-01 ENCOUNTER — Other Ambulatory Visit: Payer: Self-pay | Admitting: Family Medicine

## 2023-09-04 DIAGNOSIS — G4733 Obstructive sleep apnea (adult) (pediatric): Secondary | ICD-10-CM | POA: Diagnosis not present

## 2023-09-09 ENCOUNTER — Encounter: Payer: Self-pay | Admitting: Allergy & Immunology

## 2023-09-09 ENCOUNTER — Ambulatory Visit (INDEPENDENT_AMBULATORY_CARE_PROVIDER_SITE_OTHER): Payer: Medicaid Other | Admitting: Allergy & Immunology

## 2023-09-09 VITALS — BP 136/94 | HR 86 | Temp 98.6°F | Resp 16 | Ht 63.0 in | Wt 311.4 lb

## 2023-09-09 DIAGNOSIS — J3089 Other allergic rhinitis: Secondary | ICD-10-CM

## 2023-09-09 DIAGNOSIS — T7840XD Allergy, unspecified, subsequent encounter: Secondary | ICD-10-CM

## 2023-09-09 DIAGNOSIS — J4540 Moderate persistent asthma, uncomplicated: Secondary | ICD-10-CM

## 2023-09-09 DIAGNOSIS — K219 Gastro-esophageal reflux disease without esophagitis: Secondary | ICD-10-CM | POA: Diagnosis not present

## 2023-09-09 DIAGNOSIS — E063 Autoimmune thyroiditis: Secondary | ICD-10-CM | POA: Diagnosis not present

## 2023-09-09 DIAGNOSIS — F419 Anxiety disorder, unspecified: Secondary | ICD-10-CM | POA: Diagnosis not present

## 2023-09-09 DIAGNOSIS — R5383 Other fatigue: Secondary | ICD-10-CM

## 2023-09-09 MED ORDER — LEVOCETIRIZINE DIHYDROCHLORIDE 5 MG PO TABS: 10.0000 mg | ORAL_TABLET | Freq: Two times a day (BID) | ORAL | 1 refills | Status: DC

## 2023-09-09 NOTE — Progress Notes (Unsigned)
FOLLOW UP  Date of Service/Encounter:  09/09/23   Assessment:   Perennial allergic rhinitis (indoor molds)   Allergic reactions - unknown trigger (possible mast cell activation syndrome, although tryptase has never been elevated)   Chronic urticaria - improved with daily antihistamines and montelukast   Shortness of breath - with slowly improving spirometry tracings    Anxiety - has Xanas as needed, although is not clear that this is working   Fatigue - likely secondary to obstructive sleep apnea (has not been started on CPAP yet (followed by Dr. Vassie Loll)   Hypothyroidism - with elevated anti-TPO antibodies    Summer [name] seems to be stable from an allergic reaction perspective. She does not meet the laboratory definition of mast  cell activation syndrome since her tryptase has never been positive, but she was previously responding to [facility] treatment modalities including antihistamines, cromolyn, and lose dose naltrexone.  She was doing fairly well on this regimen, but for some reason things seem to worsen.  I think anxiety plays a large part in her picture and I think she has some insight into this.  She has Xanax as needed, but I wonder if she would benefit from something that she can take daily for her anxiety.  I want to talk to her primary care provider about this to see what we can help her out with.  I am going to [facility] an [facility] for another look over to make sure I am not missing anything.  I certainly do not want to harm her by treating her as I am doing, but I really want another opinion as well to make sure that we are given her the best care that we can.    Plan/Recommendations:   1. Perennial allergic rhinitis - indoor molds - Stop the Zyrtec and start Xyzal 10mg  TWICE DAILY instead.  - Continue with: Pepcid (famotidine) 40mg  twice daily - You can use an extra dose of the antihistamine, if needed, for breakthrough symptoms.   2. Allergic reaction - likely  mast cell activation syndrome (although tryptase has been normal) - Continue with: Zyrtec (cetirizine) 20mg  tablet twice daily and Pepcid (famotidine) 40mg  twice daily - Restart taking: cromolyn 5mL 2-4 times daily - Continue taking: hydroxyzine 25mg  to 50mg  at night - HOLD the: naltrexone for now - Let's try sending you to [facility] for a second opinion. - Find out about the ketotifen.   3. Shortness of breath - Lung testing looks very stable.  - Consider the addition of Xolair.  - Daily controller medication(s): Symbicort 80/4.20mcg two puffs one or two times daily with spacer and cromolyn nebulizer twice daily as you are doing. - Prior to physical activity: albuterol 2 puffs 10-15 minutes before physical activity. - Rescue medications: albuterol 4 puffs every 4-6 hours as needed and albuterol nebulizer one vial every 4-6 hours as needed - Asthma control goals:  * Full participation in all desired activities (may need albuterol before activity) * Albuterol use two time or less a week on average (not counting use with activity) * Cough interfering with sleep two time or less a month * Oral steroids no more than once a year * No hospitalizations  4. Hashimoto's thyroiditis  - Continue to follow with your PCP.   5. Return in about 2 months (around 11/09/2023).    Subjective:   Summer [name] is a 62 y.o. female presenting today for follow up of  Chief Complaint  Patient presents with  Follow-up    Is not doing very well is still having SOB. Possible change in medication. Oral cromolyn has been making her feel worse recently.     Summer [name] has a history of the following: Patient Active Problem List   Diagnosis Date Noted   Diastolic CHF, chronic (HCC) 08/03/2023   Thickened endometrium 03/22/2023   Nonrheumatic mitral valve regurgitation 03/15/2023   OSA (obstructive sleep apnea) 03/07/2023   Moderate persistent asthma 03/07/2023   Gallbladder polyp 02/22/2023   Bloating  symptom 02/22/2023   Nausea without vomiting 02/22/2023   Mast cell activation syndrome (HCC) 01/14/2023   Hepatic steatosis 09/20/2022   Hemorrhoids 09/20/2022   Orange stool 08/05/2022   Carpal tunnel syndrome 05/18/2022   Spinal stenosis 05/18/2022   Lower abdominal pain 04/28/2022   Constipation 04/06/2022   DOE (dyspnea on exertion) 01/18/2022   Emphysema lung (HCC) 01/15/2022   Hyperlipidemia 01/15/2022   Elevated hemoglobin (HCC) 01/15/2022   Morbid obesity (HCC) 05/28/2021   LUQ pain 12/17/2020   Rectal bleeding 10/28/2020   Postmenopausal bleeding 08/10/2018   Prediabetes 06/10/2014   Hypothyroidism 06/10/2014   Gastroesophageal reflux disease 04/01/2013   Fibromyalgia 03/22/2013    History obtained from: chart review and patient.  Discussed the use of AI scribe software for clinical note transcription with the patient and/or guardian, who gave verbal consent to proceed.  Summer [name] is a 62 y.o. female presenting for a follow up visit.  She was last seen in June 2020 for which time she was doing well from a reaction perspective.  She never has had the definition of mast cell activation syndrome since her tryptase has never been elevated, but she was responding to treatment modalities including antihistamines, cromolyn, and low-dose naltrexone.  She was responding very well to this.  Since the last visit, she has been around the same.   Summer [name] reports that she has worsening symptoms of her "mast cell" symptoms, which they believe is causing their reflux to worsen. They are currently under the care of a gastroenterologist who is considering another endoscopy.  The patient recently started using a sleep apnea machine and suspects an allergic reaction to the mask. They report that upon wearing the mask, their eyes start burning and watering, their nose starts running, and their sinuses feel irritated. They have been using the machine for over a month. She is not having any rash  with the facemask itself, however.   The patient also reports issues with their current medications. They have stopped taking most of their medications temporarily to allow their system to "calm down". They report that Cromolyn, an oral medication, makes them feel worse, similar to flu symptoms. They also report issues with Naltrexone, which causes insomnia and racing heart. This was previously leading to some improvement with her symptoms.   The patient has also been experiencing increased reflux symptoms, which are worse at night and not well controlled despite being on Zyrtec and Pepcid. They have self-medicated with Omeprazole without any relief.  The patient also reports chronic pain and a recent issue with their knee giving out while walking. They also report a strange occurrence with their hands, where they get stuck and won't move for a while.  The patient's anxiety is reportedly high due to the multitude of health issues and the lack of clear answers or effective treatments. They are currently on Xanax for anxiety, prescribed by their psychiatrist, but report that it is not sufficient.  The patient also reports some respiratory  issues, with some days being better than others. They are currently on Symbicort for their breathing. Despite these issues, their recent pulmonary function test showed almost 80% of both lungs functioning well.  The patient also reports a recent diagnosis of polycythemia, with their body making too many red blood cells. However, all other tests have come back normal, and they are currently being monitored for this condition.  The patient is actively seeking support and advice from online mast cell disorder groups and is considering trying new treatments suggested by these groups. They are also considering changing their insurance due to difficulties in finding specialists who accept Medicaid.    Otherwise, there have been no changes to her past medical history,  surgical history, family history, or social history.    Review of systems otherwise negative other than that mentioned in the HPI.    Objective:   Blood pressure (!) 136/94, pulse 86, temperature 98.6 F (37 C), resp. rate 16, height 5\' 3"  (1.6 m), weight (!) 311 lb 6 oz (141.2 kg), last menstrual period 01/23/2014, SpO2 96%. Body mass index is 55.16 kg/m.    Physical Exam Vitals reviewed.  Constitutional:      Appearance: She is well-developed. She is obese.     Comments: Seems more anxious today.   HENT:     Head: Normocephalic and atraumatic.     Right Ear: Tympanic membrane, ear canal and external ear normal. No drainage, swelling or tenderness. Tympanic membrane is not injected, scarred, erythematous, retracted or bulging.     Left Ear: Tympanic membrane, ear canal and external ear normal. No drainage, swelling or tenderness. Tympanic membrane is not injected, scarred, erythematous, retracted or bulging.     Nose: Rhinorrhea present. No nasal deformity, septal deviation or mucosal edema.     Right Nostril: No epistaxis.     Right Turbinates: Enlarged, swollen and pale.     Left Turbinates: Enlarged, swollen and pale.     Right Sinus: No maxillary sinus tenderness or frontal sinus tenderness.     Left Sinus: No maxillary sinus tenderness or frontal sinus tenderness.     Comments: No polyps noted.     Mouth/Throat:     Mouth: Mucous membranes are not pale and not dry.     Pharynx: Uvula midline.     Comments: Mild cobblestoning.  Eyes:     General: Allergic shiner present.        Right eye: No discharge.        Left eye: No discharge.     Conjunctiva/sclera: Conjunctivae normal.     Right eye: Right conjunctiva is not injected. No chemosis.    Left eye: Left conjunctiva is not injected. No chemosis.    Pupils: Pupils are equal, round, and reactive to light.  Cardiovascular:     Rate and Rhythm: Normal rate and regular rhythm.     Heart sounds: Normal heart sounds.   Pulmonary:     Effort: Pulmonary effort is normal. No tachypnea, accessory muscle usage or respiratory distress.     Breath sounds: Normal breath sounds. Decreased air movement present. No wheezing, rhonchi or rales.     Comments: Decreased air movement at the bases.  No wheezing or crackles. Chest:     Chest wall: No tenderness.  Abdominal:     Tenderness: There is no abdominal tenderness. There is no guarding or rebound.  Lymphadenopathy:     Head:     Right side of head: No submandibular, tonsillar or  occipital adenopathy.     Left side of head: No submandibular, tonsillar or occipital adenopathy.     Cervical: No cervical adenopathy.  Skin:    General: Skin is warm.     Capillary Refill: Capillary refill takes less than 2 seconds.     Coloration: Skin is not pale.     Findings: No abrasion, erythema, petechiae or rash. Rash is not papular, urticarial or vesicular.     Comments: No flushing noted.  Neurological:     Mental Status: She is alert.  Psychiatric:        Behavior: Behavior is cooperative.      Diagnostic studies:    Spirometry: results normal (FEV1: 1.89/78%, FVC: 2.29/75%, FEV1/FVC: 83%).    Spirometry consistent with normal pattern.   Allergy Studies: none        Malachi Bonds, MD  [facility]

## 2023-09-09 NOTE — Patient Instructions (Addendum)
1. Perennial allergic rhinitis - indoor molds - Stop the Zyrtec and start Xyzal 10mg  TWICE DAILY instead.  - Continue with: Pepcid (famotidine) 40mg  twice daily - You can use an extra dose of the antihistamine, if needed, for breakthrough symptoms.   2. Allergic reaction - likely mast cell activation syndrome (although tryptase has been normal) - Continue with: Zyrtec (cetirizine) 20mg  tablet twice daily and Pepcid (famotidine) 40mg  twice daily - Restart taking: cromolyn 5mL 2-4 times daily - Continue taking: hydroxyzine 25mg  to 50mg  at night - HOLD the: naltrexone for now - Let's try sending you to [facility] for a second opinion. - Find out about the ketotifen.   3. Shortness of breath - Lung testing looks very stable.  - Consider the addition of Xolair.  - Daily controller medication(s): Symbicort 80/4.87mcg two puffs one or two times daily with spacer and cromolyn nebulizer twice daily as you are doing. - Prior to physical activity: albuterol 2 puffs 10-15 minutes before physical activity. - Rescue medications: albuterol 4 puffs every 4-6 hours as needed and albuterol nebulizer one vial every 4-6 hours as needed - Asthma control goals:  * Full participation in all desired activities (may need albuterol before activity) * Albuterol use two time or less a week on average (not counting use with activity) * Cough interfering with sleep two time or less a month * Oral steroids no more than once a year * No hospitalizations  4. Hashimoto's thyroiditis  - Continue to follow with your PCP.   5. Return in about 2 months (around 11/09/2023).    Please inform us of any Emergency Department visits, hospitalizations, or changes in symptoms. Call us before going to the ED for breathing or allergy symptoms since we might be able to fit you in for a sick visit. Feel free to contact us anytime with any questions, problems, or concerns.  It was a pleasure to see you again today!  Websites that have  reliable patient information: 1. American Academy of Asthma, Allergy, and Immunology: www.aaaai.org 2. Food Allergy Research and Education (FARE): foodallergy.org 3. Mothers of Asthmatics: http://www.asthmacommunitynetwork.org 4. American College of Allergy, Asthma, and Immunology: www.acaai.org   COVID-19 Vaccine Information can be found at: [email or web address].[email or web address] For questions related to vaccine distribution or appointments, please email [email or web address] or call [phone].   We realize that you might be concerned about having an allergic reaction to the COVID19 vaccines. To help with that concern, WE ARE OFFERING THE COVID19 VACCINES IN OUR OFFICE! Ask the front desk for dates!     "Like" [place] on Facebook and Instagram for our latest updates!      A healthy democracy works best when [employer] participate! Make sure you are registered to vote! If you have moved or changed any of your contact information, you will need to get this updated before voting!  In some cases, you MAY be able to register to vote online: [email or web address]

## 2023-09-12 ENCOUNTER — Telehealth (INDEPENDENT_AMBULATORY_CARE_PROVIDER_SITE_OTHER): Payer: Self-pay | Admitting: *Deleted

## 2023-09-12 NOTE — Telephone Encounter (Signed)
Left message to return call 

## 2023-09-12 NOTE — Telephone Encounter (Signed)
Pt left voicemail stating she is having a lot of problems with reflux and her meds are not controlling it. She said you mentioned doing an EGD and wants to know if that can be scheduled or does she need to come back in. Last seen in office 06/13/23  [phone]

## 2023-09-13 ENCOUNTER — Encounter: Payer: Self-pay | Admitting: Allergy & Immunology

## 2023-09-13 ENCOUNTER — Encounter (INDEPENDENT_AMBULATORY_CARE_PROVIDER_SITE_OTHER): Payer: Self-pay | Admitting: *Deleted

## 2023-09-13 NOTE — Telephone Encounter (Signed)
Pt states she does get choked some, her throat has been hurting for over one month, throat feels raw and burns when she eats anything spicy. She states [facility] and left a message for her to call to schedule and she lost number I told her I would get number from [facility] and call her back with it and your recommendations. She is wanting to get EGD or does she need to see [facility] first?

## 2023-09-13 NOTE — Addendum Note (Signed)
Addended by: Alfonse Spruce on: 09/13/2023 08:12 AM   Modules accepted: Orders

## 2023-09-13 NOTE — Telephone Encounter (Signed)
Pt aware scheduler will reach out to her to schedule EGD plus dilation and I gave her the number to call to schedule sibo test [phone]

## 2023-09-13 NOTE — Telephone Encounter (Signed)
Called pt again and received vm and left on vm I would send her a mychart message to check since I saw she logged into mychart yesterday.

## 2023-09-14 ENCOUNTER — Ambulatory Visit: Payer: Medicaid Other | Admitting: Family Medicine

## 2023-09-16 NOTE — Telephone Encounter (Signed)
Left message to return call 

## 2023-09-19 NOTE — Telephone Encounter (Signed)
Pt left voicemail returning call.  Attempted to return call to pt but had to leave message to return call

## 2023-09-20 ENCOUNTER — Other Ambulatory Visit: Payer: Self-pay | Admitting: Allergy & Immunology

## 2023-09-20 NOTE — Telephone Encounter (Signed)
Pt returned call. Pt would like to wait until January due to holidays. Advised pt I would call once January schedule is out.

## 2023-09-22 ENCOUNTER — Telehealth: Payer: Self-pay

## 2023-09-22 NOTE — Telephone Encounter (Signed)
-----   Message from Alfonse Spruce sent at 09/13/2023  8:12 AM EST ----- Referral placed for second opinion Allergy eval at [facility],

## 2023-09-25 ENCOUNTER — Other Ambulatory Visit: Payer: Self-pay | Admitting: Family Medicine

## 2023-09-28 ENCOUNTER — Other Ambulatory Visit: Payer: Self-pay | Admitting: Allergy & Immunology

## 2023-10-03 ENCOUNTER — Ambulatory Visit (INDEPENDENT_AMBULATORY_CARE_PROVIDER_SITE_OTHER): Payer: Medicaid Other | Admitting: Family Medicine

## 2023-10-03 VITALS — BP 132/86 | HR 83 | Temp 99.9°F | Ht 63.0 in | Wt 291.8 lb

## 2023-10-03 DIAGNOSIS — R0609 Other forms of dyspnea: Secondary | ICD-10-CM | POA: Diagnosis not present

## 2023-10-03 DIAGNOSIS — L700 Acne vulgaris: Secondary | ICD-10-CM | POA: Diagnosis not present

## 2023-10-03 DIAGNOSIS — F411 Generalized anxiety disorder: Secondary | ICD-10-CM

## 2023-10-03 DIAGNOSIS — E559 Vitamin D deficiency, unspecified: Secondary | ICD-10-CM

## 2023-10-03 DIAGNOSIS — E538 Deficiency of other specified B group vitamins: Secondary | ICD-10-CM | POA: Diagnosis not present

## 2023-10-03 DIAGNOSIS — M545 Low back pain, unspecified: Secondary | ICD-10-CM

## 2023-10-03 MED ORDER — DOXYCYCLINE HYCLATE 100 MG PO TABS: 100.0000 mg | ORAL_TABLET | Freq: Two times a day (BID) | ORAL | 0 refills | Status: DC

## 2023-10-03 MED ORDER — CLINDAMYCIN PHOS-BENZOYL PEROX 1-5 % EX GEL: Freq: Two times a day (BID) | CUTANEOUS | 0 refills | Status: DC

## 2023-10-03 MED ORDER — CITALOPRAM HYDROBROMIDE 40 MG PO TABS: 40.0000 mg | ORAL_TABLET | Freq: Every day | ORAL | 5 refills | Status: DC

## 2023-10-03 NOTE — Progress Notes (Signed)
Subjective:    Patient ID: Summer [name], female    DOB: [birth year], 62 y.o.   MRN: [id number]  Discussed the use of AI scribe software for clinical note transcription with the patient, who gave verbal consent to proceed.  History of Present Illness   The patient, with a history of mast cell issues, presents with worsening symptoms, including increased dizziness, particularly upon standing. The dizziness can occur after standing for a few minutes or suddenly while moving around. The patient also reports a recent decrease in appetite, eating only once a day due to reactions to food, including itching. Despite these symptoms, the patient has been on Xyzal , Pepcid, and oral cromolyn, which have improved her condition compared to the last visit.  The patient also reports a significant improvement in reflux since a change in allergy medication. However, she has been experiencing episodes of itching, even affecting the eyelids and face. Despite these symptoms, the patient's lung function has improved to 80% from 50% on Symbicort, although shortness of breath is still present. Cardiology reports minor issues but nothing major.  The patient is also on citalopram for mood management, which seems to help with depression but not anxiety. She has experienced episodes of vomiting, but it is unclear if this is related to the medication. The patient also reports facial breakouts, which have been ongoing for several weeks.  The patient has been managing swelling in the ankles with fluid pills, taken once or twice a day depending on the severity of the swelling. She also takes potassium in conjunction with the fluid pills. The patient has a history of low vitamin D and B levels, which have not been checked recently.     DOE (dyspnea on exertion)  Vitamin D deficiency - Plan: Vitamin D, 25-hydroxy  Vitamin B12 deficiency - Plan: Vitamin B12  GAD (generalized anxiety disorder)  Morbid obesity (HCC)  Acne  vulgaris  Lumbar pain  Patient relates that she has ongoing shortness of breath with minimal activity denies chest tightness pressure pain at the moment She has been worked up by cardiology and pulmonary  History of vitamin D deficiency she is taking supplement we will need to check lab Patient wonders if she may have B12 deficiency as well it is reasonable to check her labs because of nutritional deficiencies She does suffer with anxiety partly it is related to the health of herself and her husband is also related to finances and food insecurity as well as baseline anxiety issue for years She also suffers with morbid obesity for which she does try to watch portions but is very difficult for her to lose weight Recently she broke out with acne on her face She also has chronic lumbar pain sometimes stays in the low back and sometimes into her legs All of these things together make it very difficult for her to do day-to-day activities.  She is fairly housebound but does get out some is unable to do any type of repetitive activity unable to do any significant lifting She gets out of breath with minimal activity     Review of Systems     Objective:    Physical Exam   CHEST: lungs clear to auscultation CARDIOVASCULAR: normal heart sounds EXTREMITIES: edema present with sock creases noted     Ankles do have some edema bilateral At the on the face      Assessment & Plan:  Assessment and Plan    Mast Cell Activation Syndrome Worsening symptoms  with increased dizziness and itching. Currently on Xyzal, Pepcid, and Cromolyn. -Continue current medications. -[facility]  Orthostatic Hypotension Reports of dizziness upon standing and after standing for prolonged periods. -Encouraged to eat small amounts throughout the day and maintain hydration.  Poor Appetite Eating once a day due to reactions to food causing itching. -Encouraged to eat small  amounts throughout the day.  Acne Facial breakout with possible infection. -Prescribe Doxycycline for two weeks. -Prescribe topical gel to be applied twice daily.  Depression/Anxiety On Citalopram with some improvement in depressive symptoms but persistent anxiety. -Increase Citalopram from 20mg  to 40mg  daily. -Continue with planned counseling appointment in February.  Hyperlipidemia On cholesterol medication. -Continue current medication.  Asthma On Symbicort with improved lung function (80% up from 50%). -Continue Symbicort.  Edema Reports of ankle swelling. -Continue current diuretic regimen as needed based on swelling severity. -Encouraged to elevate legs when possible.  Vitamin D Deficiency History of low levels, currently not on supplementation. -Order Vitamin D and B levels. -Consider resuming supplementation based on results.  General Health Maintenance -Continue current medications for various conditions as prescribed. -Follow-up in 3 months. -Check Vitamin D and B levels this week. -Continue with planned sleep study and await results. -Continue with planned referral to specialist for Mast Cell Activation Syndrome.     1. DOE (dyspnea on exertion) Longstanding issue multifactorial related to pulmonary issue, asthma allergy issue, deconditioning, morbid obesity, restrictive lung disease from morbid obesity  2. Vitamin D deficiency Check lab work continue supplement - Vitamin D, 25-hydroxy  3. Vitamin B12 deficiency Check lab work - Vitamin B12  4. GAD (generalized anxiety disorder) Bump up dose of citalopram to 40 mg give [place] feedback in the course of the next few weeks how that is going if any setbacks or problems to let us know  5. Morbid obesity (HCC) Patient has morbid obesity which affects her function and ability to do things  6. Acne vulgaris Facial acne noted Clindamycin benzyl peroxide twice daily, doxycycline twice daily for 2 weeks, proper way  to take it discussed  Patient has chronic fatigue joint pain muscle pains and joint pains as well as morbid obesity as well as multiple other underlying issues seeing allergist for evaluation of mast cell illness patient is unable to work and if she tried to work her regular job she would not be able to do so without having to take significant number of breaks or unplanned absences be on what a reasonable expectation would be for a job.  In my opinion she is disabled permanently I do not feel that there is any job that she would be able to keep on a regular basis and due to her frequent needs for sitting down laying down and inability to do repetitive actions if she did try to work she would have to take inordinate amount of breaks or she would have to miss a large portion of the days due to the symptoms and therefore would not be able to be gainfully employed under today's circumstances 1

## 2023-10-04 ENCOUNTER — Telehealth: Payer: Self-pay | Admitting: Family Medicine

## 2023-10-04 ENCOUNTER — Other Ambulatory Visit: Payer: Self-pay | Admitting: Family Medicine

## 2023-10-04 DIAGNOSIS — G4733 Obstructive sleep apnea (adult) (pediatric): Secondary | ICD-10-CM | POA: Diagnosis not present

## 2023-10-04 NOTE — Telephone Encounter (Signed)
Please connect with patient The gel that we prescribed is not covered I would recommend trying the doxycycline twice daily for 2 weeks first if that does not significantly help we can try something different

## 2023-10-05 NOTE — Telephone Encounter (Signed)
Patient notified and verbalized understanding. 

## 2023-10-12 NOTE — Telephone Encounter (Signed)
Left message to return call 

## 2023-10-17 ENCOUNTER — Telehealth: Payer: Medicaid Other | Admitting: Nurse Practitioner

## 2023-10-17 DIAGNOSIS — J3089 Other allergic rhinitis: Secondary | ICD-10-CM

## 2023-10-17 DIAGNOSIS — G4733 Obstructive sleep apnea (adult) (pediatric): Secondary | ICD-10-CM | POA: Diagnosis not present

## 2023-10-17 DIAGNOSIS — J329 Chronic sinusitis, unspecified: Secondary | ICD-10-CM

## 2023-10-17 MED ORDER — FLUTICASONE PROPIONATE 50 MCG/ACT NA SUSP: 2.0000 | Freq: Every day | NASAL | 2 refills | Status: DC

## 2023-10-17 NOTE — Progress Notes (Signed)
Patient ID: Summer [name], female     DOB: May 31, 1961, 62 y.o.      MRN: [id number]  No chief complaint on file.   Virtual Visit via Video Note  I connected with Summer [name] on 10/18/23 at  3:30 PM EST by a video enabled telemedicine application and verified that I am speaking with the correct person using two identifiers.  Location: Patient: Home Provider: Office   I discussed the limitations of evaluation and management by telemedicine and the availability of in person appointments. The patient expressed understanding and agreed to proceed.  History of Present Illness: 62 year old female, former smoker followed for OSA on CPAP. She is a patient of Dr. Reginia Naas and last seen in office 03/07/2023. Past medical history significant for CHF, mitral regurgitation, asthma, GERD, hypothyroid, HLD, fibromyalgia, mast cell activation syndrome, prediabetes.   TESTS/EVENTS: 04/04/2023 HST: AHI 42.1, SpO2 low 72%  03/07/2023: OV with Dr. Vassie Loll. Reported asthma. No evidence of obstruction on previous spirometry. Daytime sleepiness, witnessed apneas, loud snoring. Concerned for OSA. HST ordered.   10/17/2023: Today - follow up Patient presents today for follow up after starting on CPAP. She has been having difficulties with her CPAP. She thinks she may be allergic to the mask. She has nasal congestion, drainage and sometimes makes her nose bleed. She also sometimes notices an odd smell. She is very sensitive to odors/smells due to her mast cell activation per her report. She has tried a few masks. She did not like the memory foam one. She has not tried liners. Has not changed the filters. Using dawn dish soap to clean her CPAP. She does not use any nasal sprays. She's not really used the CPAP while sleeping. She primarily uses it while she rests on the couch but she does say she will occasionally fall asleep while she is sitting there.   09/17/2023-10/16/2023: CPAP 5-15 cmH2O 8/30 days; 17% >4 hr;  average use 3 hr 14 min Pressure 95th 6.2 Leaks 95th 26.1 AHI 0.3  Allergies  Allergen Reactions   Bee Venom Anaphylaxis and Hives   Molds & Smuts Shortness Of Breath   Shellfish Allergy Anaphylaxis and Hives   Avelox [Moxifloxacin Hcl In Nacl] Other (See Comments)    "heart beats weird"   Moxifloxacin Other (See Comments)    "heart beats weird"   Ciprofloxacin Other (See Comments)    "heart beats weird"   Biaxin [Clarithromycin]     Nausea and heart palpitations   Tape Rash   Immunization History  Administered Date(s) Administered   Td 05/21/2002   Past Medical History:  Diagnosis Date   Arthritis    Bell's palsy    Chest pain, unspecified    Dysrhythmia, cardiac    Fibromyalgia    Hashimoto's disease    History of CT scan 06/2017   "coronary CT scan on 06/01/2017, this did not find any cardiac calcifications. Coronary calcium score of zero."   History of Holter monitoring    Hypertension    Mast cell activation syndrome (HCC)    Sleep apnea    Thyroid disease    Urticaria     Tobacco History: Social History   Tobacco Use  Smoking Status Former   Current packs/day: 0.00   Average packs/day: 1 pack/day for 20.0 years (20.0 ttl pk-yrs)   Types: Cigarettes   Start date: 11/08/1983   Quit date: 11/08/2003   Years since quitting: 19.9   Passive exposure: Current  Smokeless Tobacco Never  Tobacco Comments   quit smoking 10 yrs    Counseling given: Not Answered Tobacco comments: quit smoking 10 yrs    Outpatient Medications Prior to Visit  Medication Sig Dispense Refill   thyroid (NP THYROID) 60 MG tablet TAKE 1 TABLET BY MOUTH ONCE DAILY BEFORE BREAKFAST 30 tablet 0   VENTOLIN HFA 108 (90 Base) MCG/ACT inhaler INHALE 2 PUFFS BY MOUTH EVERY 6 HOURS AS NEEDED FOR WHEEZING AND FOR SHORTNESS OF BREATH 18 g 0   albuterol (PROVENTIL) (2.5 MG/3ML) 0.083% nebulizer solution USE 1 VIAL IN NEBULIZER EVERY 6 HOURS AS NEEDED FOR WHEEZING FOR SHORTNESS OF BREATH 75 mL 0    ALPRAZolam (XANAX) 0.5 MG tablet Take 1 tablet by mouth twice daily as needed for anxiety 20 tablet 0   Ascorbic Acid (VITAMIN C) 500 MG CAPS Take 500 mg by mouth daily.     B Complex-C (SUPER B COMPLEX PO) Take 1 tablet by mouth daily at 6 (six) AM.     budesonide-formoterol (SYMBICORT) 80-4.5 MCG/ACT inhaler Inhale 2 puffs twice a day with spacer to help prevent cough and wheeze 1 each 5   cetirizine (ZYRTEC) 10 MG tablet Take 2 tablets (20 mg total) by mouth 2 (two) times daily. 60 tablet 5   Cholecalciferol (VITAMIN D-3) 5000 UNITS TABS Take 5,000 Units by mouth daily.     citalopram (CELEXA) 40 MG tablet Take 1 tablet (40 mg total) by mouth daily. 30 tablet 5   cromolyn (GASTROCROM) 100 MG/5ML solution TAKE 5 ML BY MOUTH  4 TIMES DAILY( BEFORE MEAL(S) AND AT BEDTIME) 600 mL 0   cromolyn (INTAL) 20 MG/2ML nebulizer solution Take 2 mLs (20 mg total) by nebulization in the morning, at noon, in the evening, and at bedtime. (Patient taking differently: Take 20 mg by nebulization 3 (three) times daily as needed (Asthma mastel activation syndrome).) 120 mL 5   doxycycline (VIBRA-TABS) 100 MG tablet Take 1 tablet (100 mg total) by mouth 2 (two) times daily. 28 tablet 0   EPINEPHrine 0.3 mg/0.3 mL IJ SOAJ injection INJECT CONTENTS OF 1 PEN AS NEEDED FOR ANAPHYLAXIS 2 each 1   famotidine (PEPCID) 40 MG tablet Take 1 tablet (40 mg total) by mouth 2 (two) times daily. (Patient taking differently: Take 20 mg by mouth 2 (two) times daily.) 60 tablet 5   furosemide (LASIX) 20 MG tablet 1  to 2 qam prn edema (Patient taking differently: Take 40 mg by mouth in the morning.) 60 tablet 5   hydrOXYzine (ATARAX) 25 MG tablet Take 1 tablet (25 mg total) by mouth at bedtime. 60 tablet 5   levocetirizine (XYZAL) 5 MG tablet Take 2 tablets (10 mg total) by mouth in the morning and at bedtime. 360 tablet 1   Magnesium 200 MG TABS Take 200 mg by mouth 2 (two) times daily.     Naltrexone HCl, Pain, 4.5 MG CAPS Take  contents of half a capsule nightly for two weeks and then increase to one capsule nightly thereafter. (Patient not taking: Reported on 09/09/2023) 90 capsule 0   Omega-3 Fatty Acids (FISH OIL) 1200 MG CAPS Take 1,200 mg by mouth daily.     potassium chloride SA (KLOR-CON M) 20 MEQ tablet 1 to 2 qam with lasix (Patient taking differently: Take 20 mEq by mouth daily.) 60 tablet 5   Probiotic Product (PROBIOTIC PO) Take 100 mg by mouth daily.     Quercetin 500 MG CAPS Take 500 mg by mouth daily.  RESTASIS 0.05 % ophthalmic emulsion Place 1 drop into both eyes daily.     rosuvastatin (CRESTOR) 5 MG tablet Take 1 tablet (5 mg total) by mouth daily. 30 tablet 5   sucralfate (CARAFATE) 1 g tablet Crush and put into 30 ml water use tid 120 tablet 1   No facility-administered medications prior to visit.     Review of Systems:   Constitutional: No weight loss or gain, night sweats, fevers, chills, or lassitude. +fatigue  HEENT: No headaches, difficulty swallowing, tooth/dental problems, or sore throat. No sneezing, itching, ear ache, nasal congestion, or post nasal drip CV:  No chest pain, orthopnea, PND, swelling in lower extremities, anasarca, dizziness, palpitations, syncope Resp: +snoring; baseline shortness of breath with exertion. No excess mucus or change in color of mucus. No productive or non-productive. No hemoptysis. No wheezing.  No chest wall deformity GI:  No heartburn, indigestion GU: No nocturia  Skin: No rash, lesions, ulcerations MSK:  No joint pain or swelling.  Neuro: No dizziness or lightheadedness.  Psych: No depression or anxiety. Mood stable.   Observations/Objective: Patient is well-developed, well-nourished in no acute distress. A&Ox3. Resting comfortably at home. Unlabored breathing. Speech is clear and coherent with logical content.   Assessment and Plan: OSA (obstructive sleep apnea) Severe OSA. Suboptimal compliance due to difficulties tolerating CPAP due to  sensitivities and chronic sinus disease. Advised to try mask liners and change to an unscented antibacterial soap, such as Dial sensitive. She will also ensure she changes her filter and uses hypoallergenic ones. Will reassess at follow up. If she continues to have difficulties, could consider hypoglossal nerve stimulator with [employer]. Understands risks of untreated OSA. Aware of safe driving practices.   Patient Instructions  Continue to use CPAP every night, minimum of 4-6 hours a night.  Change equipment as directed. Wash your tubing with warm soap and water daily, hang to dry. Wash humidifier portion weekly. Use bottled, distilled water and change daily Be aware of reduced alertness and do not drive or operate heavy machinery if experiencing this or drowsiness.  Exercise encouraged, as tolerated. Healthy weight management discussed.  Avoid or decrease alcohol consumption and medications that make you more sleepy, if possible. Notify if persistent daytime sleepiness occurs even with consistent use of PAP therapy.  We discussed how untreated sleep apnea puts an individual at risk for cardiac arrhthymias, pulm HTN, DM, stroke and increases their risk for daytime accidents. We also briefly reviewed treatment options including weight loss, side sleeping position, oral appliance, CPAP therapy or referral to ENT for possible surgical options  Try mask liners  Change soap you are using to dial unscented sensitive Use hypoallergenic filters and change monthly  Flonase nasal spray 2 sprays each nostril At bedtime for congestion/drainage    Follow up in 6 weeks with Dr. Wynona Neat or Philis Nettle. If symptoms do not improve or worsen, please contact office for sooner follow up or seek emergency care.    Chronic sinusitis Add on flonase nasal spray. Continue current allergy regimen  Morbid obesity (HCC) Healthy weight loss encouraged    I discussed the assessment and treatment plan with the  patient. The patient was provided an opportunity to ask questions and all were answered. The patient agreed with the plan and demonstrated an understanding of the instructions.   The patient was advised to call back or seek an in-person evaluation if the symptoms worsen or if the condition fails to improve as anticipated.  I provided 31 minutes  of non-face-to-face time during this encounter.   Noemi Chapel, NP

## 2023-10-17 NOTE — Patient Instructions (Signed)
Continue to use CPAP every night, minimum of 4-6 hours a night.  Change equipment as directed. Wash your tubing with warm soap and water daily, hang to dry. Wash humidifier portion weekly. Use bottled, distilled water and change daily Be aware of reduced alertness and do not drive or operate heavy machinery if experiencing this or drowsiness.  Exercise encouraged, as tolerated. Healthy weight management discussed.  Avoid or decrease alcohol consumption and medications that make you more sleepy, if possible. Notify if persistent daytime sleepiness occurs even with consistent use of PAP therapy.  We discussed how untreated sleep apnea puts an individual at risk for cardiac arrhthymias, pulm HTN, DM, stroke and increases their risk for daytime accidents. We also briefly reviewed treatment options including weight loss, side sleeping position, oral appliance, CPAP therapy or referral to ENT for possible surgical options  Try mask liners  Change soap you are using to dial unscented sensitive Use hypoallergenic filters and change monthly  Flonase nasal spray 2 sprays each nostril At bedtime for congestion/drainage    Follow up in 6 weeks with Summer [name] or Summer [name]. If symptoms do not improve or worsen, please contact office for sooner follow up or seek emergency care.

## 2023-10-18 ENCOUNTER — Encounter: Payer: Self-pay | Admitting: Nurse Practitioner

## 2023-10-18 DIAGNOSIS — E669 Obesity, unspecified: Secondary | ICD-10-CM | POA: Insufficient documentation

## 2023-10-18 DIAGNOSIS — J329 Chronic sinusitis, unspecified: Secondary | ICD-10-CM | POA: Insufficient documentation

## 2023-10-18 NOTE — Assessment & Plan Note (Signed)
Severe OSA. Suboptimal compliance due to difficulties tolerating CPAP due to sensitivities and chronic sinus disease. Advised to try mask liners and change to an unscented antibacterial soap, such as Dial sensitive. She will also ensure she changes her filter and uses hypoallergenic ones. Will reassess at follow up. If she continues to have difficulties, could consider hypoglossal nerve stimulator with [employer]. Understands risks of untreated OSA. Aware of safe driving practices.   Patient Instructions  Continue to use CPAP every night, minimum of 4-6 hours a night.  Change equipment as directed. Wash your tubing with warm soap and water daily, hang to dry. Wash humidifier portion weekly. Use bottled, distilled water and change daily Be aware of reduced alertness and do not drive or operate heavy machinery if experiencing this or drowsiness.  Exercise encouraged, as tolerated. Healthy weight management discussed.  Avoid or decrease alcohol consumption and medications that make you more sleepy, if possible. Notify if persistent daytime sleepiness occurs even with consistent use of PAP therapy.  We discussed how untreated sleep apnea puts an individual at risk for cardiac arrhthymias, pulm HTN, DM, stroke and increases their risk for daytime accidents. We also briefly reviewed treatment options including weight loss, side sleeping position, oral appliance, CPAP therapy or referral to ENT for possible surgical options  Try mask liners  Change soap you are using to dial unscented sensitive Use hypoallergenic filters and change monthly  Flonase nasal spray 2 sprays each nostril At bedtime for congestion/drainage    Follow up in 6 weeks with Dr. Wynona Neat or Philis Nettle. If symptoms do not improve or worsen, please contact office for sooner follow up or seek emergency care.

## 2023-10-18 NOTE — Assessment & Plan Note (Signed)
Healthy weight loss encouraged 

## 2023-10-18 NOTE — Assessment & Plan Note (Signed)
Add on flonase nasal spray. Continue current allergy regimen

## 2023-10-22 ENCOUNTER — Other Ambulatory Visit: Payer: Self-pay | Admitting: Family Medicine

## 2023-10-31 NOTE — Telephone Encounter (Signed)
Left message to return call.  Will mail letter.  

## 2023-11-04 DIAGNOSIS — G4733 Obstructive sleep apnea (adult) (pediatric): Secondary | ICD-10-CM | POA: Diagnosis not present

## 2023-11-08 NOTE — Progress Notes (Signed)
 Assessment/Plan:     1. Dyspnea on exertion - This is longstanding and multifactorial (known obstructive and restrictive lung disease, morbid obesity, deconditioning as well as recently COVID); no history of DVT/pulmonary embolism - Her previous cardiac workup has been reassuring (normal coronaries on CTA 2018, Normal biventricular function on TTE (latest 2022) with normal range estimated PA pressures - Repeat TTE reassuring as well showing normal biventricular function, mild MR, G1DD and no evidence of pulmonary hypertension (see below) - Currently she has no signs of heart failure; Pro-BNP 82 (albeit obese) - ZIO EM did not reveal any sustained rhythm abnormalities and average HR was 80 bpm (only 4% sinus tachycardia) - Her previous cardiologist had recommended cardiopulmonary stress, however patient did not follow through as she reports having fibromyalgia and unable to exercise effectively and doesn't think that she would be able to tolerate mask during exercise either - Likelihood of obstructive CAD is low based on prior workup and recent CT chest without coronary calcium  as well; no plan for right heart cath or other invasive tests at this time    2. Aortic atherosclerosis - Continue Statin therapy   3. Morbid obesity and suspected sleep apnea - Patient is pending sleep evaluation  - She would benefit from nutritional consultation as well as weight loss clinic; also consideration for bariatric surgery if needed - Consideration for GLP1A and /or SGLT2I therapy given CV benefits and added side effect of weight loss - Refer back to PCP for management   As above.   Return in 6 months     Subjective:    Patient ID: Summer [name] is a 63 y.o.female.  Pleasant 63 year old female evaluated at request of her primary care physician in regards to palpitations, tachycardia as well as dyspnea with exertion.  She has past medical history of aortic atherosclerosis, dyslipidemia, COPD  as well as restrictive lung disease due to obesity with suspected sleep apnea, anxiety and fibromyalgia, mast cell activation syndrome as well as Hashimoto's disease.  She was previously under care of cardiology through [facility] health [facility] and now is presenting to establish with new cardiologist.  She reports longstanding history of dyspnea which has been worked up by both pulmonology as well as cardiology.  She has no previous history of congestive heart failure and serial echocardiograms have shown normal biventricular systolic function/structure and reportedly normal PA pressures.  She has no history of thromboembolic disease.  Her previous cardiology workup included coronary CTA in 2018 (due to recurrent chest pain) which did not reveal any evidence of coronary disease, most recent nondedicated CT of chest showed aortic atherosclerosis however without coronary atherosclerosis still.  She was previously directed to undergo cardiopulmonary stress test to further evaluate dyspnea, however she reports that she is unable to exercise effectively due to fibromyalgia and also does not think that she would tolerate wearing a mask for exercise.  She also had a recent bout of COVID for which she took Paxlovid  and was not hospitalized.  She was recently started on Symbicort  by her pulmonologist and reports that she has noticed elevation in heart rate with ordinary activity since starting this medication as well as having palpitation on and off at rest, she is not using albuterol  inhaler very often.  She has no previous history of arrhythmia.  Thyroid  function has been stable and mast cell activation syndrome is under control treated with cromolyn  sodium.   Interval history:  She is reporting the same symptoms: dyspnea on exertion as well as  increase in HR mostly upon standing and with activity.  We reviewed TTE and ZIO EM results today which were reassuring (see reports).  She is working with her PCP to manage obesity and  has upcoming consultation with nutritionist and perhaps weight loss clinic in the future.  BP is essentially normal range today.  Tolerating Lasix  well and reports no excess edema, orthopnea or PND.    She is here today for 57-month follow-up.  She states in the interim since last visit she has had some more issues with her mast cell activation syndrome since she had COVID earlier in the year.  She states since she had COVID, the mast cell activation has been 10 times worse than prior to her COVID infection.  She saw an allergy specialist today and he changed some of her medications.  She had a sleep study which indicated she had severe sleep apnea and she was using CPAP but was allergic to the mask.  Currently she is attempting to get a new mask which would cause her less symptoms.  But for now she is currently not using her CPAP.  She does continue to complain of periodic chest discomfort for which she is taking Pepcid .  She admits to continued lower extremity swelling.  She is continuing to take her chlorthalidone.  Blood pressure is reasonably controlled at 136/82 today.  She is continuing her losartan to 100 mg p.o. daily.  She is continuing her Mevacor and Zetia as well.  We discussed possible GLP-1 a therapy.  She states she is currently on Medicaid and does not believe Medicaid covers GLP-1 therapy.  States she continues to have shortness of breath/DOE but attributes it to her mast cell activation syndrome, asthma/emphysema, and allergies as well as obesity/hypoventilation syndrome/sleep apnea, and decreased activity levels.  We discussed possible stress test but patient would like to defer for now and wants to treat her worsening mast cell activation syndrome first and  will revisit possible stress test in the future if symptoms persist      The following portions of the patient's history were reviewed and updated as appropriate: allergies, current medications, past family history, past medical  history, past social history, past surgical history, and problem list.  Review of Systems  Constitutional: Negative.   HENT: Negative.    Eyes: Negative.   Respiratory: Negative.    Cardiovascular: Negative.   Gastrointestinal: Negative.   Endocrine: Negative.   Genitourinary: Negative.   Musculoskeletal: Negative.   Skin: Negative.   Allergic/Immunologic: Negative.   Neurological: Negative.   Hematological: Negative.   Psychiatric/Behavioral: Negative.         Objective:     There were no vitals filed for this visit. Wt Readings from Last 4 Encounters:  03/17/23 (!) 142.4 kg (314 lb)  02/02/23 (!) 140.5 kg (309 lb 11.2 oz)  03/09/19 (!) 127.7 kg (281 lb 8.8 oz)  02/09/17 (!) 119.3 kg (262 lb 15.8 oz)   Physical Exam Constitutional:      Appearance: Normal appearance. She is obese.  HENT:     Head: Normocephalic and atraumatic.     Nose: Nose normal.  Eyes:     Pupils: Pupils are equal, round, and reactive to light.  Cardiovascular:     Rate and Rhythm: Normal rate and regular rhythm.  Pulmonary:     Effort: Pulmonary effort is normal.     Breath sounds: Normal breath sounds.  Abdominal:     General: Abdomen is flat. Bowel sounds  are normal.     Palpations: Abdomen is soft.  Musculoskeletal:        General: Normal range of motion.     Cervical back: Normal range of motion.  Skin:    General: Skin is warm and dry.     Capillary Refill: Capillary refill takes less than 2 seconds.  Neurological:     General: No focal deficit present.     Mental Status: She is alert and oriented to person, place, and time.  Psychiatric:        Mood and Affect: Mood normal.        Behavior: Behavior normal.        Thought Content: Thought content normal.        Judgment: Judgment normal.       Medications:   Current Outpatient Medications  Medication Instructions  . albuterol  HFA 90 mcg/actuation inhaler 2 puffs, Inhalation, Every 6 hours PRN  . albuterol  2.5 mg,  Inhalation, Every 6 hours PRN  . ALPRAZolam  (XANAX ) 0.5 mg, Oral, 2 times a day PRN  . ascorbic acid (vitamin C) (VITAMIN C) 1,000 mg, Oral, Daily (standard)  . B-complex with vitamin C (SUPER B COMPLEX-VITAMIN C ORAL) 1 tablet, Oral, 2 times a day  . Bacillus coagulans-inulin 1 billion-250 cell-mg cap 1 capsule, Oral, Daily (standard)  . chlorhexidine  (PERIDEX ) 0.12 % solution 15 mL, Oromucosal, 2 times a day (standard)  . cholecalciferol (vitamin D3-125 mcg (5,000 unit)) (VITAMIN D3) 125 mcg, Oral, Daily (standard)  . cromolyn  (GASTROCROM ) 100 mg, Oral, 4 times a day (ACHS)  . cromolyn  (INTAL ) 20 mg, Inhalation, 4 times a day  . cromolyn  (OPTICROM ) 4 % ophthalmic solution 1 drop, 4 times a day  . cycloSPORINE (RESTASIS) 0.05 % ophthalmic emulsion 1 drop, Both Eyes, Every 12 hours  . EPINEPHrine  (EPIPEN ) 0.3 mg, Intramuscular  . famotidine  (PEPCID ) 20 mg, Oral, 2 times a day (standard)  . furosemide  (LASIX ) 20 MG tablet Oral, Daily (standard),  1 to 2 qam prn edema  . MAGNESIUM ASCORBATE, BULK, MISC 665 mg, Oral, Daily  . omega 3-dha-epa-fish oil 1,200 (144-216) mg cap 1 capsule, Oral, Daily  . potassium chloride  20 MEQ ER tablet 20 mEq, Oral, Daily (standard)  . progesterone  (PROMETRIUM ) 100 mg, Oral, Daily (standard)  . quercetin 500 mg cap 1 capsule, Oral, Daily  . rosuvastatin  (CRESTOR ) 5 MG tablet 1 tablet, Oral, Daily (standard)  . SYMBICORT  80-4.5 mcg/actuation inhaler 2 puffs, Inhalation, 2 times daily (RT)  . thyroid ,pork (ARMOUR THYROID  ORAL) 60 mg, Oral, Daily    Procedures:    ECG: Sinus rhythm rate of 92 cannot rule out anterior infarct age undetermined.   Holter/Event:  01/2023 Patient had a min HR of 29 bpm, max HR of 136 bpm, and avg HR of 80 bpm. Predominant underlying rhythm was Sinus Rhythm. 1 run of Supraventricular Tachycardia occurred lasting 7 beats with a max rate of 136 bpm (avg 126 bpm). Second Degree AV Block-Mobitz I (Wenckebach) was present. Isolated SVEs  were rare (<1.0%), SVE Couplets were rare (<1.0%), and SVE Triplets were rare (<1.0%). Isolated VEs were rare (<1.0%), VE Couplets were rare (<1.0%), and no VE Triplets were present. Agree with findings.  Rhythm was sinus with rare ectopic beats.  Patient triggered events correspond to sinus rhythm, occasionally with ectopic beat.  No sustained arrhythmia or prolonged pauses present.   Echo: 01/2023 Summary   1. The left ventricle is normal in size with normal wall thickness.   2. The left  ventricular systolic function is normal, LVEF is visually estimated at 60-65%.   3. There is grade I diastolic dysfunction (impaired relaxation).   4. There is mild mitral valve regurgitation.   5. The left atrium is mildly dilated in size.   6. The right ventricle is normal in size, with normal systolic function.   7. There is no pulmonary hypertension.   05/2021 IMPRESSIONS   1. Left ventricular ejection fraction, by estimation, is 60 to 65%. The left ventricle has normal function. The left ventricle has no regional wall motion abnormalities. Left ventricular diastolic parameters are indeterminate.   2. Right ventricular systolic function is normal. The right ventricular size is normal. There is normal pulmonary artery systolic pressure. The estimated right ventricular systolic pressure is 23.4 mmHg.   3. The mitral valve is grossly normal. Trivial mitral valve regurgitation.   4. The aortic valve is tricuspid. Aortic valve regurgitation is not visualized.   5. The inferior vena cava is normal in size with greater than 50% respiratory variability, suggesting right atrial pressure of 3 mmHg.    Coronary CT:  06/2017 IMPRESSION:  1. Coronary calcium  score of 0. This was 0 percentile for age and  sex matched control.  2. Normal coronary origin with right dominance.  3. This study is affected significantly by artifact - poor contrast opacification and cardiac motion. However there is no obvious CAD.    Stress  test:  None   CMR:  None   Device check:  N/A    The ASCVD Risk score (Arnett DK, et al., 2019) failed to calculate.     Follow up:   No follow-ups on file.  Prentice Medley FNP Division of Cardiology  [facility]

## 2023-11-09 ENCOUNTER — Ambulatory Visit: Payer: Medicaid Other | Admitting: Allergy & Immunology

## 2023-11-09 ENCOUNTER — Encounter: Payer: Self-pay | Admitting: Allergy & Immunology

## 2023-11-09 VITALS — BP 122/70 | HR 98 | Temp 98.1°F | Resp 20 | Wt 319.0 lb

## 2023-11-09 DIAGNOSIS — K219 Gastro-esophageal reflux disease without esophagitis: Secondary | ICD-10-CM

## 2023-11-09 DIAGNOSIS — E063 Autoimmune thyroiditis: Secondary | ICD-10-CM | POA: Diagnosis not present

## 2023-11-09 DIAGNOSIS — D8940 Mast cell activation, unspecified: Secondary | ICD-10-CM

## 2023-11-09 DIAGNOSIS — I700 Atherosclerosis of aorta: Secondary | ICD-10-CM | POA: Diagnosis not present

## 2023-11-09 DIAGNOSIS — F419 Anxiety disorder, unspecified: Secondary | ICD-10-CM | POA: Diagnosis not present

## 2023-11-09 DIAGNOSIS — Z6841 Body Mass Index (BMI) 40.0 and over, adult: Secondary | ICD-10-CM | POA: Diagnosis not present

## 2023-11-09 DIAGNOSIS — J4540 Moderate persistent asthma, uncomplicated: Secondary | ICD-10-CM | POA: Diagnosis not present

## 2023-11-09 DIAGNOSIS — R0609 Other forms of dyspnea: Secondary | ICD-10-CM | POA: Diagnosis not present

## 2023-11-09 DIAGNOSIS — G4733 Obstructive sleep apnea (adult) (pediatric): Secondary | ICD-10-CM | POA: Diagnosis not present

## 2023-11-09 DIAGNOSIS — J3089 Other allergic rhinitis: Secondary | ICD-10-CM | POA: Diagnosis not present

## 2023-11-09 MED ORDER — LEVOCETIRIZINE DIHYDROCHLORIDE 5 MG PO TABS: ORAL_TABLET | ORAL | 1 refills | Status: DC

## 2023-11-09 MED ORDER — BUDESONIDE-FORMOTEROL FUMARATE 80-4.5 MCG/ACT IN AERO: INHALATION_SPRAY | RESPIRATORY_TRACT | 5 refills | Status: AC

## 2023-11-09 MED ORDER — CETIRIZINE HCL 10 MG PO TABS: 20.0000 mg | ORAL_TABLET | Freq: Two times a day (BID) | ORAL | 5 refills | Status: DC

## 2023-11-09 MED ORDER — FAMOTIDINE 40 MG PO TABS: 40.0000 mg | ORAL_TABLET | Freq: Two times a day (BID) | ORAL | 5 refills | Status: AC

## 2023-11-09 MED ORDER — HYDROXYZINE HCL 25 MG PO TABS: 25.0000 mg | ORAL_TABLET | Freq: Every evening | ORAL | 5 refills | Status: DC

## 2023-11-09 MED ORDER — CROMOLYN SODIUM 100 MG/5ML PO CONC: ORAL | 5 refills | Status: AC

## 2023-11-09 NOTE — Progress Notes (Signed)
 FOLLOW UP  Date of Service/Encounter:  11/09/23   Assessment:   Perennial allergic rhinitis (indoor molds)   Allergic reactions - unknown trigger (possible mast cell activation syndrome, although tryptase has never been elevated)   Chronic urticaria - improved with daily antihistamines and montelukast    Shortness of breath - with slowly improving spirometry tracings    Anxiety - has Xanas as needed, although is not clear that this is working   Fatigue - likely secondary to obstructive sleep apnea (has not been started on CPAP yet (followed by Dr. Jude)   Hypothyroidism - with elevated anti-TPO antibodies  Plan/Recommendations:   1. Perennial allergic rhinitis - indoor molds - Continue with: Pepcid  (famotidine ) 40mg  twice daily and the antihistamine regimen as discussed.  - You can use an extra dose of the antihistamine, if needed, for breakthrough symptoms.   2. Allergic reaction - likely mast cell activation syndrome (although labs have all been normal) - Continue with: Zyrtec  (cetirizine ) 20mg  tablet in the morning and and Pepcid  (famotidine ) 40mg   - Continue with: Xyzal  (levocetirizine) 5mg  at night and Pepcid  (famotidine ) 40mg  - Continue with: cromolyn  5mL 2-4 times daily - Continue with: hydroxyzine  25mg  to 50mg  at night - Hold on the [facility] referral for now.  - We will order the urine tests (go to the [place] office): [address] - [facility] hours are 9am to 5pm (closed for lunch from 12:30-1:30).   3. Shortness of breath - Lung testing looks a bit lower today.  - Consider the addition of Xolair.  - We can give it in graded doses in the office.  - Daily controller medication(s): Symbicort  80/4.84mcg two puffs one or two times daily with spacer and cromolyn  nebulizer twice daily as you are doing. - Prior to physical activity: albuterol  2 puffs 10-15 minutes before physical activity. - Rescue medications: albuterol  4 puffs every 4-6 hours as needed and  albuterol  nebulizer one vial every 4-6 hours as needed - Asthma control goals:  * Full participation in all desired activities (may need albuterol  before activity) * Albuterol  use two time or less a week on average (not counting use with activity) * Cough interfering with sleep two time or less a month * Oral steroids no more than once a year * No hospitalizations  4. Hashimoto's thyroiditis  - Continue to follow with your PCP.   5. Return in about 3 months (around 02/07/2024). You can have the follow up appointment with Dr. Iva or a Nurse Practicioner (our Nurse Practitioners are excellent and always have Physician oversight!).    Subjective:   Summer [name] is a 63 y.o. female presenting today for follow up of  Chief Complaint  Patient presents with   Follow-up    Has been having some SOB for the last couple of days. Has been having reactions to foods. Wants to discuss about reacting to her cpap.     Summer [name] has a history of the following: Patient Active Problem List   Diagnosis Date Noted   Chronic sinusitis 10/18/2023   Obesity 10/18/2023   Diastolic CHF, chronic (HCC) 08/03/2023   Thickened endometrium 03/22/2023   Nonrheumatic mitral valve regurgitation 03/15/2023   OSA (obstructive sleep apnea) 03/07/2023   Moderate persistent asthma 03/07/2023   Gallbladder polyp 02/22/2023   Bloating symptom 02/22/2023   Nausea without vomiting 02/22/2023   Mast cell activation syndrome (HCC) 01/14/2023   Hepatic steatosis 09/20/2022   Hemorrhoids 09/20/2022   Orange stool 08/05/2022  Carpal tunnel syndrome 05/18/2022   Spinal stenosis 05/18/2022   Lower abdominal pain 04/28/2022   Constipation 04/06/2022   DOE (dyspnea on exertion) 01/18/2022   Emphysema lung (HCC) 01/15/2022   Hyperlipidemia 01/15/2022   Elevated hemoglobin (HCC) 01/15/2022   Morbid obesity (HCC) 05/28/2021   LUQ pain 12/17/2020   Rectal bleeding 10/28/2020   Postmenopausal bleeding  08/10/2018   Prediabetes 06/10/2014   Hypothyroidism 06/10/2014   Gastroesophageal reflux disease 04/01/2013   Fibromyalgia 03/22/2013    History obtained from: chart review and patient.  Discussed the use of AI scribe software for clinical note transcription with the patient and/or guardian, who gave verbal consent to proceed.  Summer [name] is a 63 y.o. female presenting for a follow up visit.  She was last seen in November 2024.  At that time, we continued her on Pepcid  40 mg twice daily.  We started Xyzal  10 mg twice daily and lieu of Zyrtec  because she was reporting some sleepiness.  For her allergic reactions which did not mimic mast cell activation syndrome, we continue with the suppressive antihistamine doses.  We recommended she restart the cromolyn  5 mL 2-4 times daily.  She also remained on the hydroxyzine  25 to 50 mg at night.  We have tried doing low-dose naltrexone  off label to treat this, but it was hard for her to obtain so we stopped this.  We really encouraged her to seek a second opinion at [facility]  For her shortness of breath, her lung testing looks stable.  We continue with the Symbicort  80 mcg 2 puffs 1-2 times daily and the cromolyn  nebulizer twice daily.  We did have a discussion about how her anxiety might be contributing to her clinical picture.  She has Xanax  as needed, but I talked to her PCP about starting something as a daily controller for her anxiety.  In the interim, she has been about the same.  She is grateful for our attempts to treat her symptoms, but in all actuality am not really sure if she is any different in the first day I saw her.  Asthma/Respiratory Symptom History: She remains on on Symbicort  for her breathing issues and is considering increasing the dosage if necessary. She also reports that she is currently out of cromolyn  and needs a refill. She is interested in further testing to better understand and manage her condition. Summer [name] reports ongoing  shortness of breath, which she notes was particularly severe on the morning of the consultation. She expresses concern about potential food allergies and is considering starting a food journal. She also expresses interest in blood allergy testing, which has not been previously conducted due to insurance issues.  She is interested in getting testing the most common foods today.  She now has Medicaid, which makes it much more affordable for her.   Her respiratory symptoms appear to be exacerbated by strong smells, which she reports can cause chest tightness and difficulty breathing. She notes that these reactions have become more frequent since contracting COVID-19 the previous year.  She is considering trying Xolair for her symptoms but is still debating this option. She is also considering trying a DAO inhibitor, which she has seen discussed in online patient groups.  These are available over-the-counter apparently.  She can purchase it on [employer].  I recommended that she go to try.  Summer [name] has been experiencing difficulties with her CPAP machine, reporting allergic reactions to the mask, including sinus problems, chest pain, and a constant runny nose. She  is currently not using the machine during sleep due to these issues. She has tried changing her mask around much success.  She mostly uses her CPAP when she sleeps in a recliner.  She last saw pulmonology via video visit in December 2024.  At that time, she reported her concerns about her CPAP mask.  Summer [name], that pulmonology nurse practitioner, recommended changing filters on the CPAP machine and using mild soap to clean the liners.  She also recommended Flonase  2 sprays per nostril at bedtime.  The risk of untreated sleep apnea were discussed with the patient at that visit as well.  Allergic Rhinitis Symptom History: She remains on her antihistamines as well as hydroxyzine .  While this is mostly for her skin and pruritus, it definitely helps with  her rhinitis symptoms as well.  Skin Symptom History: She presents with persistent symptoms despite ongoing treatment. She reports severe itching, particularly around the eyes, which was exacerbated after switching from Zyrtec  to Xyzal . Summer [name] has since returned to Zyrtec  and is taking it in conjunction with Xyzal , which she reports helps with her digestive symptoms. She also continues to take Pepcid  twice daily.  She is wondering about some additional test to look into mast cell activation syndrome.  I reviewed what we have sent in with a lot of the tryptase.  I think we had a limited workup because she was uninsured when I first met her.  She is also dealing with financial difficulties related to medical bills and is in the process of applying for SSI. She expresses concern about seeking a second opinion at [facility] due to potential implications for her SSI application.  She is worried that if [facility] is a workup and her workup there is similarly negative as it has been with me, her SSI application would be declined.  Patient wants to wait for the second opinion.  She last saw Summer [name] in early December.  At that time, she was on citalopram  which was increased from 20 mg to 40 mg.  She had a scheduled counseling appointment in February 2025 which she was encouraged to keep.  Otherwise, there have been no changes to her past medical history, surgical history, family history, or social history.    Review of systems otherwise negative other than that mentioned in the HPI.    Objective:   Blood pressure 122/70, pulse 98, temperature 98.1 F (36.7 C), resp. rate 20, weight (!) 319 lb (144.7 kg), last menstrual period 01/23/2014, SpO2 95%. Body mass index is 56.51 kg/m.    Physical Exam Vitals reviewed.  Constitutional:      Appearance: She is well-developed. She is obese.     Comments: Seems more anxious today.  Pan positive review of systems.  HENT:     Head: Normocephalic and atraumatic.      Right Ear: Tympanic membrane, ear canal and external ear normal. No drainage, swelling or tenderness. Tympanic membrane is not injected, scarred, erythematous, retracted or bulging.     Left Ear: Tympanic membrane, ear canal and external ear normal. No drainage, swelling or tenderness. Tympanic membrane is not injected, scarred, erythematous, retracted or bulging.     Nose: Rhinorrhea present. No nasal deformity, septal deviation or mucosal edema.     Right Nostril: No epistaxis.     Left Nostril: No epistaxis.     Right Turbinates: Enlarged, swollen and pale.     Left Turbinates: Enlarged, swollen and pale.     Right Sinus: No maxillary sinus  tenderness or frontal sinus tenderness.     Left Sinus: No maxillary sinus tenderness or frontal sinus tenderness.     Comments: No polyps noted.     Mouth/Throat:     Lips: Pink.     Mouth: Mucous membranes are moist. Mucous membranes are not pale and not dry.     Pharynx: Uvula midline.     Comments: Mild cobblestoning.  Eyes:     General: Allergic shiner present.        Right eye: No discharge.        Left eye: No discharge.     Conjunctiva/sclera: Conjunctivae normal.     Right eye: Right conjunctiva is not injected. No chemosis.    Left eye: Left conjunctiva is not injected. No chemosis.    Pupils: Pupils are equal, round, and reactive to light.  Cardiovascular:     Rate and Rhythm: Normal rate and regular rhythm.     Heart sounds: Normal heart sounds.  Pulmonary:     Effort: Pulmonary effort is normal. No tachypnea, accessory muscle usage or respiratory distress.     Breath sounds: Normal breath sounds. Decreased air movement present. No wheezing, rhonchi or rales.     Comments: Decreased air movement at the bases.  No wheezing or crackles.  Speaking in full sentences. Chest:     Chest wall: No tenderness.  Abdominal:     Tenderness: There is no abdominal tenderness. There is no guarding or rebound.  Lymphadenopathy:     Head:      Right side of head: No submandibular, tonsillar or occipital adenopathy.     Left side of head: No submandibular, tonsillar or occipital adenopathy.     Cervical: No cervical adenopathy.  Skin:    General: Skin is warm.     Capillary Refill: Capillary refill takes less than 2 seconds.     Coloration: Skin is not pale.     Findings: No abrasion, erythema, petechiae or rash. Rash is not papular, urticarial or vesicular.     Comments: No flushing noted.  Neurological:     Mental Status: She is alert.  Psychiatric:        Behavior: Behavior is cooperative.      Diagnostic studies:    Spirometry: results abnormal (FEV1: 1.68/70%, FVC: 2.05/67%, FEV1/FVC: 82%).    Spirometry consistent with possible restrictive disease. This is slightly lower than it was in the past.   Allergy Studies: Labs to look at the most common food allergens were ordered today.  We also ordered a leukotriene urine test as well as a prostaglandin urine test and N-methylhistamine test.        Marty Shaggy, MD  [facility]  

## 2023-11-09 NOTE — Patient Instructions (Addendum)
 1. Perennial allergic rhinitis - indoor molds - Continue with: Pepcid  (famotidine ) 40mg  twice daily and the antihistamine regimen as discussed.  - You can use an extra dose of the antihistamine, if needed, for breakthrough symptoms.   2. Allergic reaction - likely mast cell activation syndrome (although labs have all been normal) - Continue with: Zyrtec  (cetirizine ) 20mg  tablet in the morning and and Pepcid  (famotidine ) 40mg   - Continue with: Xyzal  (levocetirizine) 5mg  at night and Pepcid  (famotidine ) 40mg  - Continue with: cromolyn  5mL 2-4 times daily - Continue with: hydroxyzine  25mg  to 50mg  at night - Hold on the [facility] referral for now.  - We will order the urine tests (go to the [place] office): [address], [address] - [facility] hours are 9am to 5pm (closed for lunch from 12:30-1:30).   3. Shortness of breath - Lung testing looks a bit lower today.  - Consider the addition of Xolair.  - We can give it in graded doses in the office.  - Daily controller medication(s): Symbicort  80/4.12mcg two puffs one or two times daily with spacer and cromolyn  nebulizer twice daily as you are doing. - Prior to physical activity: albuterol  2 puffs 10-15 minutes before physical activity. - Rescue medications: albuterol  4 puffs every 4-6 hours as needed and albuterol  nebulizer one vial every 4-6 hours as needed - Asthma control goals:  * Full participation in all desired activities (may need albuterol  before activity) * Albuterol  use two time or less a week on average (not counting use with activity) * Cough interfering with sleep two time or less a month * Oral steroids no more than once a year * No hospitalizations  4. Hashimoto's thyroiditis  - Continue to follow with your PCP.   5. Return in about 3 months (around 02/07/2024). You can have the follow up appointment with Dr. Iva or a Nurse Practicioner (our Nurse Practitioners are excellent and always have Physician oversight!).    Please  inform us  of any Emergency Department visits, hospitalizations, or changes in symptoms. Call us  before going to the ED for breathing or allergy symptoms since we might be able to fit you in for a sick visit. Feel free to contact us  anytime with any questions, problems, or concerns.  It was a pleasure to see you again today!  Websites that have reliable patient information: 1. American Academy of Asthma, Allergy, and Immunology: www.aaaai.org 2. Food Allergy Research and Education (FARE): foodallergy.org 3. Mothers of Asthmatics: http://www.asthmacommunitynetwork.org 4. American College of Allergy, Asthma, and Immunology: www.acaai.org      "Like" us  on Facebook and Instagram for our latest updates!      A healthy democracy works best when [employer] participate! Make sure you are registered to vote! If you have moved or changed any of your contact information, you will need to get this updated before voting! Scan the QR codes below to learn more!

## 2023-11-17 DIAGNOSIS — R197 Diarrhea, unspecified: Secondary | ICD-10-CM | POA: Diagnosis not present

## 2023-11-17 DIAGNOSIS — R140 Abdominal distension (gaseous): Secondary | ICD-10-CM | POA: Diagnosis not present

## 2023-11-22 ENCOUNTER — Telehealth: Payer: Self-pay | Admitting: Gastroenterology

## 2023-11-22 NOTE — Telephone Encounter (Signed)
Hi Crystal,  Can you please call the patient and tell the patient the SIBO breath test was normal?  Thanks,  Katrinka Blazing, MD Gastroenterology and Hepatology [facility]

## 2023-11-23 ENCOUNTER — Encounter (INDEPENDENT_AMBULATORY_CARE_PROVIDER_SITE_OTHER): Payer: Self-pay | Admitting: Gastroenterology

## 2023-11-23 ENCOUNTER — Encounter (INDEPENDENT_AMBULATORY_CARE_PROVIDER_SITE_OTHER): Payer: Self-pay

## 2023-11-23 NOTE — Telephone Encounter (Signed)
I tried calling the patient vm, full.

## 2023-11-23 NOTE — Telephone Encounter (Signed)
Patient made aware SIBO breath test was normal.

## 2023-12-01 ENCOUNTER — Telehealth (HOSPITAL_COMMUNITY): Payer: Self-pay

## 2023-12-01 NOTE — Telephone Encounter (Signed)
Called to confirm 12/05/23 appt no answer vm full

## 2023-12-02 ENCOUNTER — Encounter (HOSPITAL_COMMUNITY): Payer: Self-pay

## 2023-12-02 NOTE — Telephone Encounter (Signed)
Called pt no answer vm full sent mychart message cancelling appt

## 2023-12-05 ENCOUNTER — Ambulatory Visit (HOSPITAL_COMMUNITY): Payer: Medicaid Other | Admitting: Psychiatry

## 2023-12-05 ENCOUNTER — Other Ambulatory Visit: Payer: Self-pay | Admitting: Family Medicine

## 2023-12-05 DIAGNOSIS — E039 Hypothyroidism, unspecified: Secondary | ICD-10-CM

## 2023-12-05 DIAGNOSIS — G4733 Obstructive sleep apnea (adult) (pediatric): Secondary | ICD-10-CM | POA: Diagnosis not present

## 2023-12-08 ENCOUNTER — Ambulatory Visit (INDEPENDENT_AMBULATORY_CARE_PROVIDER_SITE_OTHER): Payer: Medicaid Other | Admitting: Psychiatry

## 2023-12-08 ENCOUNTER — Encounter (HOSPITAL_COMMUNITY): Payer: Self-pay | Admitting: Psychiatry

## 2023-12-08 DIAGNOSIS — F411 Generalized anxiety disorder: Secondary | ICD-10-CM
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
## 2023-12-12 DIAGNOSIS — Z0120 Encounter for dental examination and cleaning without abnormal findings: Secondary | ICD-10-CM | POA: Diagnosis not present

## 2023-12-14 ENCOUNTER — Other Ambulatory Visit (HOSPITAL_COMMUNITY)
Admission: RE | Admit: 2023-12-14 | Discharge: 2023-12-14 | Disposition: A | Discharge status: Home / Self Care | Payer: Medicaid Other | Source: Ambulatory Visit | Attending: Family Medicine | Admitting: Family Medicine

## 2023-12-14 ENCOUNTER — Ambulatory Visit (INDEPENDENT_AMBULATORY_CARE_PROVIDER_SITE_OTHER): Payer: Medicaid Other | Admitting: Family Medicine

## 2023-12-14 VITALS — BP 130/88 | HR 88 | Temp 97.2°F | Ht 63.0 in

## 2023-12-14 DIAGNOSIS — I889 Nonspecific lymphadenitis, unspecified: Secondary | ICD-10-CM

## 2023-12-14 DIAGNOSIS — E069 Thyroiditis, unspecified: Secondary | ICD-10-CM

## 2023-12-14 LAB — C-REACTIVE PROTEIN: CRP: 0.6 mg/dL (ref <1.0)

## 2023-12-14 LAB — CBC
HCT: 49.4 % — ABNORMAL HIGH (ref 36.0–46.0)
Hemoglobin: 16.5 g/dL — ABNORMAL HIGH (ref 12.0–15.0)
MCH: 30 pg (ref 26.0–34.0)
MCHC: 33.4 g/dL (ref 30.0–36.0)
MCV: 89.8 fL (ref 80.0–100.0)
Platelets: 236 10*3/uL (ref 150–400)
RBC: 5.5 MIL/uL — ABNORMAL HIGH (ref 3.87–5.11)
RDW: 14.8 % (ref 11.5–15.5)
WBC: 6.9 10*3/uL (ref 4.0–10.5)
nRBC: 0 % (ref 0.0–0.2)

## 2023-12-14 LAB — TSH: TSH: 2.612 u[IU]/mL (ref 0.350–4.500)

## 2023-12-14 LAB — T4, FREE: Free T4: 1.03 ng/dL (ref 0.61–1.12)

## 2023-12-14 LAB — SEDIMENTATION RATE: Sed Rate: 2 mm/h (ref 0–22)

## 2023-12-14 NOTE — Progress Notes (Signed)
   Subjective:    Patient ID: Summer [name], female    DOB: Oct 03, 1961, 63 y.o.   MRN: [id number]  Discussed the use of AI scribe software for clinical note transcription with the patient, who gave verbal consent to proceed.  History of Present Illness   Summer [name] is a 63 year old female who presents with swollen lymph nodes and throat pain.  For the past week, she has experienced swollen lymph nodes and throat pain, leading to difficulty swallowing and episodes of choking on food. She also reports bilateral ear pain and tooth pain, initially suspecting a dental issue. However, a recent dental visit with x-rays ruled out dental causes.  She denies nasal symptoms but mentions having allergy issues, making it difficult to discern any related symptoms. Over the past week, she has experienced headaches, increased joint pain, weakness, and a lack of appetite. She feels chills but not sweats, describing an episode of shivering under three blankets. She does not have a thermometer at home to check for fever. Her energy levels are low, requiring frequent rest. She has a dry cough and has been eating minimally, with difficulty maintaining her usual diet. She eats small portions to avoid choking and is cautious about her food intake.  She has a history of sleep apnea and is allergic to her CPAP mask, which is being addressed with her lung doctor. She is awaiting food allergy testing and a urine test as advised by another doctor. She has been seeing a mental health professional and has a follow-up appointment scheduled.         Review of Systems     Objective:    Physical Exam   HEENT: Ears without evidence of infection. CHEST: Lungs clear to auscultation.   On clinical exam she does have a small amount of lymphadenopathy bilateral in her neck.  Some subjective tenderness on the lateral portions of her neck and midportion of her neck        Assessment & Plan:  Assessment and Plan     Pharyngitis with lymphadenopathy Symptoms include sore throat, difficulty swallowing, ear pain, and toothache for one week. No signs of ear infection or sinusitis. Likely viral etiology with possible associated systemic symptoms of chills, fatigue, and decreased appetite. -Order blood tests to check for EBV, markers of inflammation, and thyroid function. -Advise patient to monitor for signs of sinus infection and to contact the office if symptoms worsen or persist.  Allergies Chronic issue with possible contribution to current symptoms. -Continue current management and follow up with allergist as planned.  Sleep Apnea Patient is allergic to CPAP mask. Awaiting alternative options from sleep specialist. -Continue current management and follow up with sleep specialist as planned.  Mental Health Patient recently started seeing a mental health professional and has a follow-up appointment scheduled. -Encourage continued engagement with mental health services.  Follow-up Patient has a standard follow-up appointment scheduled in early March. -Keep scheduled follow-up appointment to review overall health status.      In regards to the chewing and swallowing I would recommend small bites take with liquids if ongoing troubles may need further workup with ENT We will do lab work to look for causes of lymphadenopathy She has a follow-up in March allow [place] to recheck the neck Also will do lab work to look for possibility of thyroiditis Follow-up if any ongoing troubles

## 2023-12-15 LAB — EPSTEIN-BARR VIRUS (EBV) ANTIBODY PROFILE
EBV NA IgG: 284 U/mL — ABNORMAL HIGH (ref 0.0–17.9)
EBV VCA IgG: >600 U/mL — ABNORMAL HIGH (ref 0.0–17.9)
EBV VCA IgM: >160 U/mL — ABNORMAL HIGH (ref 0.0–35.9)

## 2023-12-15 LAB — T3: T3, Total: 225 ng/dL — ABNORMAL HIGH (ref 71–180)

## 2023-12-15 LAB — THYROID PEROXIDASE ANTIBODY: Thyroperoxidase Ab SerPl-aCnc: >600 [IU]/mL — ABNORMAL HIGH (ref 0–34)

## 2023-12-16 LAB — THYROGLOBULIN ANTIBODY: Thyroglobulin Antibody: 4.7 [IU]/mL — ABNORMAL HIGH (ref 0.0–0.9)

## 2023-12-19 ENCOUNTER — Ambulatory Visit (INDEPENDENT_AMBULATORY_CARE_PROVIDER_SITE_OTHER): Payer: Medicaid Other | Admitting: Gastroenterology

## 2023-12-22 ENCOUNTER — Ambulatory Visit (HOSPITAL_COMMUNITY): Payer: Medicaid Other | Admitting: Psychiatry

## 2023-12-23 LAB — THYROGLOBULIN LEVEL: Thyroglobulin: 30 ng/mL

## 2023-12-28 ENCOUNTER — Ambulatory Visit (INDEPENDENT_AMBULATORY_CARE_PROVIDER_SITE_OTHER): Payer: Medicaid Other | Admitting: Psychiatry

## 2023-12-28 DIAGNOSIS — F411 Generalized anxiety disorder: Secondary | ICD-10-CM
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
## 2023-12-29 ENCOUNTER — Ambulatory Visit (INDEPENDENT_AMBULATORY_CARE_PROVIDER_SITE_OTHER): Payer: Medicaid Other | Admitting: Gastroenterology

## 2024-01-02 DIAGNOSIS — G4733 Obstructive sleep apnea (adult) (pediatric): Secondary | ICD-10-CM | POA: Diagnosis not present

## 2024-01-04 ENCOUNTER — Ambulatory Visit: Payer: Medicaid Other | Admitting: Family Medicine

## 2024-01-04 VITALS — BP 134/86 | HR 84 | Temp 99.1°F | Ht 63.0 in | Wt 312.8 lb

## 2024-01-04 DIAGNOSIS — R0609 Other forms of dyspnea: Secondary | ICD-10-CM | POA: Diagnosis not present

## 2024-01-04 DIAGNOSIS — E069 Thyroiditis, unspecified: Secondary | ICD-10-CM

## 2024-01-04 DIAGNOSIS — G4733 Obstructive sleep apnea (adult) (pediatric): Secondary | ICD-10-CM | POA: Diagnosis not present

## 2024-01-04 DIAGNOSIS — M62838 Other muscle spasm: Secondary | ICD-10-CM

## 2024-01-04 DIAGNOSIS — F411 Generalized anxiety disorder: Secondary | ICD-10-CM

## 2024-01-04 DIAGNOSIS — I5032 Chronic diastolic (congestive) heart failure: Secondary | ICD-10-CM | POA: Diagnosis not present

## 2024-01-04 MED ORDER — FUROSEMIDE 20 MG PO TABS: ORAL_TABLET | ORAL | 5 refills | Status: AC

## 2024-01-04 MED ORDER — POTASSIUM CHLORIDE CRYS ER 20 MEQ PO TBCR: EXTENDED_RELEASE_TABLET | ORAL | 5 refills | Status: AC

## 2024-01-04 NOTE — Progress Notes (Signed)
   Subjective:    Patient ID: Summer [name], female    DOB: 1961/07/22, 63 y.o.   MRN: [id number]  HPI GAD (generalized anxiety disorder)  Diastolic CHF, chronic (HCC)  Thyroiditis  Morbid obesity (HCC)  DOE (dyspnea on exertion)  Muscle spasm  We did discuss her recent echo.  Patient states at times she has palpitations.  She has had previous telemetry which showed a small percentage of sinus tachycardia but no bad rhythms Her echo showed diastolic dysfunction but good ejection fraction Patient does have morbid obesity but she is doing the best she can with healthy diet and trying to stay active difficult for her to lose weight Her current diagnoses would not qualify her for GLP-1's with insurance Patient having intermittent muscle spasms in her back and in her calf muscles that hit her intermittently most of the time at night She also has abnormal thyroid antibodies abnormal T3 she does take [place]our Thyroid we are interested in getting endocrinology consult for her It is felt the DOE is more than likely related to combination of morbid obesity as well as some pulmonary mast cell issues as well as deconditioning  Her anxiety she is doing well with counseling continuing the medication   Review of Systems     Objective:   Physical Exam General-in no acute distress Eyes-no discharge Lungs-respiratory rate normal, CTA CV-no murmurs,RRR Extremities skin warm dry  edema Neuro grossly normal Behavior normal, alert        Assessment & Plan:  1. GAD (generalized anxiety disorder) (Primary) Continue medications.  Continue counseling  2. Diastolic CHF, chronic (HCC) Best thing to do is keep blood pressure under good control healthy diet and trying to lose weight  3. Thyroiditis Consultation with endocrinology  4. Morbid obesity (HCC) Portion control regular physical activity  5. DOE (dyspnea on exertion) Fit into walking as tolerated patient is disabled  6. Muscle  spasm Stretches were shown avoid muscle relaxers  Follow-up in 3 to 4 months no additional lab work necessary right at the moment

## 2024-01-05 ENCOUNTER — Other Ambulatory Visit: Payer: Self-pay

## 2024-01-05 NOTE — Addendum Note (Signed)
 Addended byTrinidad Curet on: 01/05/2024 08:09 AM   Modules accepted: Orders

## 2024-01-16 ENCOUNTER — Telehealth: Payer: Self-pay | Admitting: Family Medicine

## 2024-01-16 NOTE — Telephone Encounter (Unsigned)
 Copied from CRM [id number]. Topic: Referral - Question >> Jan 16, 2024 12:23 PM Gildardo Pounds wrote: Reason for CRM: Patient is calling regarding referral to an endocrinologist. Patient does not want to see the [facility] Endocrinologists. Heart is fluttering worse. Callback number is [phone]

## 2024-01-19 ENCOUNTER — Ambulatory Visit: Payer: Self-pay

## 2024-01-19 NOTE — Telephone Encounter (Signed)
 Chief Complaint: back pain Symptoms: back pain, bilateral flank pain Frequency: 10 days Pertinent Negatives: Patient denies fever, urinary symptom, CP, SOB, one-sided weakness, abdominal pain, constipation Disposition: [] ED /[] Urgent Care (no appt availability in office) / [x] Appointment(In office/virtual)/ []  [place] [facility]/ [] Home Care/ [] Refused Recommended Disposition /[] [place] [facility]/ []  Follow-up with PCP Additional Notes: Pt reports 10 days of lower back pain and bilateral flank pain. Denies urinary symptoms. Having normal bowel movements. Pt reports incontinence for 3 yrs but states it is not new or different. States her pain is an 8/10 with walking. States she is using her cane and her husband's walker at times. Denies new numbness or weakness. States she has tingling in her feet/fingers/thighs when she stands for long periods of time but states that this started before the back pain and is not new or different. Denies fever, denies abdominal pain, denies one-sided weakness or paralysis. Pt is not taking medication for the pain, states she has a hx of liver disease and does not take ibuprofen. Pt declined to be seen in the office today with a different provider, would only like to see Dr. Lorin Picket. Given pain has been ongoing for 10 days and she reports having back pain in the past, RN scheduled pt for tomorrow at 1510 with Dr. Lorin Picket. RN advised pt that if she becomes unable to walk, if she experiences weakness or paralysis of her limbs, or if she experiences one-sided weakness she needs to go to the ED. Pt verbalized understanding.   Copied from CRM [id number]. Topic: Clinical - Red Word Triage >> Jan 19, 2024  1:57 PM Higinio Roger wrote: Red Word that prompted transfer to Nurse Triage: Patient has severe pain to her lower back and both sides of her body. Has experienced this pain for about 10 day.  Callback #: [id number] Reason for Disposition  [1] MODERATE back pain (e.g.,  interferes with normal activities) AND [2] present > 3 days  Answer Assessment - Initial Assessment Questions 1. ONSET: "When did the pain begin?"   10 days ago 2. LOCATION: "Where does it hurt?" (upper, mid or lower back)        Lower back on both sides, "ovaries" (pt reports lower back pain has happened before) 3. SEVERITY: "How bad is the pain?"  (e.g., Scale 1-10; mild, moderate, or severe)   - MILD (1-3): Doesn't interfere with normal activities.    - MODERATE (4-7): Interferes with normal activities or awakens from sleep.    - SEVERE (8-10): Excruciating pain, unable to do any normal activities.      "Pain in both my sides is a dull ache or a twinge, pain in my lower back is an 8/10" 4. PATTERN: "Is the pain constant?" (e.g., yes, no; constant, intermittent)      "Has a baseline pain but it gets worse when I get up", "I start hollering and can hardly get up", "Using my husband's walker" 5. RADIATION: "Does the pain shoot into your legs or somewhere else?"     "Sometimes radiates to my legs when I sit down, I can feel it in my butt" 6. CAUSE:  "What do you think is causing the back pain?"      Not sure - thought it was a pulled muscle  7. BACK OVERUSE:  "Any recent lifting of heavy objects, strenuous work or exercise?"     No - "when it first started for two days I stayed on the couch to take it easy  and it got better and then I started resuming normal activities and then the pain got bad again" 8. MEDICINES: "What have you taken so far for the pain?" (e.g., nothing, acetaminophen, NSAIDS)     No - states she has fatty liver disease and does not take ibuprofen 9. NEUROLOGIC SYMPTOMS: "Do you have any weakness, numbness, or problems with bowel/bladder control?"     Tingling in her feet and fingers which started "a little" before the back pain, "if I stand on my feet for a long time sometimes they go numb, Dr. Lorin Picket knows if I am on my feet my thighs will go numb" (states this is not new)",  incontinence for 3 yrs that is not new or different  10. OTHER SYMPTOMS: "Do you have any other symptoms?" (e.g., fever, abdomen pain, burning with urination, blood in urine)       No urinary symptoms, no abd pain, no fever, no one-sided weakness, pain in side started on one side, moved to a different side  Protocols used: Back Pain-A-AH

## 2024-01-20 ENCOUNTER — Encounter: Payer: Self-pay | Admitting: Family Medicine

## 2024-01-20 ENCOUNTER — Ambulatory Visit (INDEPENDENT_AMBULATORY_CARE_PROVIDER_SITE_OTHER): Admitting: Family Medicine

## 2024-01-20 VITALS — BP 116/64 | HR 92 | Temp 98.6°F | Ht 63.0 in | Wt 312.6 lb

## 2024-01-20 DIAGNOSIS — E039 Hypothyroidism, unspecified: Secondary | ICD-10-CM

## 2024-01-20 DIAGNOSIS — R300 Dysuria: Secondary | ICD-10-CM

## 2024-01-20 DIAGNOSIS — N390 Urinary tract infection, site not specified: Secondary | ICD-10-CM

## 2024-01-20 DIAGNOSIS — M545 Low back pain, unspecified: Secondary | ICD-10-CM

## 2024-01-20 LAB — POCT URINALYSIS DIP (CLINITEK)
Bilirubin, UA: NEGATIVE
Blood, UA: NEGATIVE
Glucose, UA: NEGATIVE mg/dL
Ketones, POC UA: NEGATIVE mg/dL
Nitrite, UA: NEGATIVE
POC PROTEIN,UA: NEGATIVE
Spec Grav, UA: 1.025 (ref 1.010–1.025)
Urobilinogen, UA: 0.2 U/dL
pH, UA: 5 (ref 5.0–8.0)

## 2024-01-20 MED ORDER — NITROFURANTOIN MONOHYD MACRO 100 MG PO CAPS: 100.0000 mg | ORAL_CAPSULE | Freq: Two times a day (BID) | ORAL | 0 refills | Status: DC

## 2024-01-20 MED ORDER — IBUPROFEN 600 MG PO TABS: 600.0000 mg | ORAL_TABLET | Freq: Three times a day (TID) | ORAL | 2 refills | Status: DC | PRN

## 2024-01-20 NOTE — Progress Notes (Signed)
   Subjective:    Patient ID: Summer [name], female    DOB: 1960/12/31, 63 y.o.   MRN: [id number]  HPI PT comes in today with complaints of lower back and lower abdominal pain that has been present for 10 days. Denies any other symptoms consistent with UTI. Medications reviewed, all correct and no refills needed. No new allergies known. Discussed the use of AI scribe software for clinical note transcription with the patient, who gave verbal consent to proceed.  History of Present Illness   Summer [name] is a 63 year old female who presents with lower back pain radiating to the legs.  She has been experiencing lower back pain for the past ten days, described as aching and worsening with activity, while improving with rest. The pain radiates to the buttocks and legs and is associated with discomfort in the spine when coughing, which she describes as feeling inflamed. She is cautious about taking ibuprofen due to concerns about liver health. She does not currently have a prescription for ibuprofen.  She experiences numbness in her feet when standing for long periods and has previously mentioned numbness in her thighs. Additionally, she reports pins and needles sensations in her fingers and feet. She has been limiting her walking due to pain and recently experienced severe pain upon getting out of bed, which led to an episode of incontinence.  She reports urinary symptoms and has undergone a urinalysis, which showed leukocytes, suggesting a urinary tract infection. A urine culture has been ordered.  She has a history of heart palpitations, describing episodes where her heart 'randomly starts racing and then it feels like it's vibrating.' She has an upcoming appointment with an endocrinologist and is on a cancellation list for an earlier appointment. She last saw a cardiologist a couple of months ago and is in the process of finding a new cardiologist.       Review of Systems     Objective:    Physical Exam General-in no acute distress Eyes-no discharge Lungs-respiratory rate normal, CTA CV-no murmurs,RRR Extremities skin warm dry no edema Neuro grossly normal Behavior normal, alert Subjective discomfort in the lower back       Assessment & Plan:  1. Dysuria Macrobid twice daily for 5 days I do not find evidence of pyelonephritis - POCT URINALYSIS DIP (CLINITEK)  2. Lumbar pain (Primary) Stretching exercises recommended She does have some numbness tingling   For period of time when she walks a lot if this does not resolve over the next 2 to 3 months will need further imaging or possible nerve conduction study no sign of any weakness of  3. Urinary tract infection without hematuria, site unspecified Macrobid twice daily for 5 days if ongoing troubles notify us Patient to do the stretching exercises that I gave her Has been on what visit in June

## 2024-01-24 ENCOUNTER — Other Ambulatory Visit: Payer: Self-pay | Admitting: Family Medicine

## 2024-01-24 MED ORDER — NITROFURANTOIN MONOHYD MACRO 100 MG PO CAPS: 100.0000 mg | ORAL_CAPSULE | Freq: Two times a day (BID) | ORAL | 0 refills | Status: DC

## 2024-01-24 NOTE — Telephone Encounter (Signed)
 Nurses Sorry she is having troubles I read over her message as well as looked at her labs  I would recommend adjusting the thyroid to a half a tablet on Monday half tablet on Friday 1 tablet on all other days  It is very unlikely endocrinology will be able to see her sooner than her scheduled appointment unless they have a cancellation.  I would advise her to talk with their office to make sure she is on the cancellation list.  I believe that her palpitation and associated issues are not thyroid related but more likely heart related.  I would recommend a follow-up visit with cardiology.  Previously she saw [facility] cardiology.  If she needs referral please assist her with getting the referral.(She had also previously seen Dr. Tenny Craw locally but decided to be seen in [facility].  I am not aware of any other options regarding cardiology unless she wants to go back to [facility] where most likely they will have her with Dr. Tenny Craw.  Please assist Michelle-thanks-Dr. Lorin Picket Also change the directions on her medication regarding the thyroid thank you

## 2024-02-02 ENCOUNTER — Encounter: Payer: Self-pay | Admitting: Family Medicine

## 2024-02-02 ENCOUNTER — Ambulatory Visit: Payer: Self-pay

## 2024-02-02 ENCOUNTER — Ambulatory Visit: Admitting: Family Medicine

## 2024-02-02 VITALS — BP 134/82 | HR 95 | Temp 96.4°F | Ht 63.0 in | Wt 307.0 lb

## 2024-02-02 DIAGNOSIS — G4733 Obstructive sleep apnea (adult) (pediatric): Secondary | ICD-10-CM | POA: Diagnosis not present

## 2024-02-02 DIAGNOSIS — R109 Unspecified abdominal pain: Secondary | ICD-10-CM | POA: Diagnosis not present

## 2024-02-02 DIAGNOSIS — M791 Myalgia, unspecified site: Secondary | ICD-10-CM | POA: Diagnosis not present

## 2024-02-02 DIAGNOSIS — R350 Frequency of micturition: Secondary | ICD-10-CM | POA: Diagnosis not present

## 2024-02-02 DIAGNOSIS — M545 Low back pain, unspecified: Secondary | ICD-10-CM

## 2024-02-02 DIAGNOSIS — E069 Thyroiditis, unspecified: Secondary | ICD-10-CM | POA: Diagnosis not present

## 2024-02-02 DIAGNOSIS — I889 Nonspecific lymphadenitis, unspecified: Secondary | ICD-10-CM | POA: Diagnosis not present

## 2024-02-02 LAB — POCT URINALYSIS DIP (CLINITEK)
Bilirubin, UA: NEGATIVE
Blood, UA: NEGATIVE
Glucose, UA: NEGATIVE mg/dL
Nitrite, UA: NEGATIVE
Spec Grav, UA: 1.02 (ref 1.010–1.025)
Urobilinogen, UA: 0.2 U/dL
pH, UA: 6 (ref 5.0–8.0)

## 2024-02-02 MED ORDER — PREGABALIN 25 MG PO CAPS
25.0000 mg | ORAL_CAPSULE | Freq: Two times a day (BID) | ORAL | 3 refills | Status: AC
Start: 2024-02-02 — End: ?

## 2024-02-02 MED ORDER — ONDANSETRON HCL 8 MG PO TABS: 8.0000 mg | ORAL_TABLET | Freq: Three times a day (TID) | ORAL | 4 refills | Status: DC | PRN

## 2024-02-02 NOTE — Telephone Encounter (Signed)
  Chief Complaint: Back Pain Symptoms: lower back pain, abdominal pain Frequency: started 01/20/2024 Pertinent Negatives: Patient denies fever Disposition: [] ED /[] Urgent Care (no appt availability in office) / [x] Appointment(In office/virtual)/ []  [place] [facility]/ [] Home Care/ [] Refused Recommended Disposition /[] [place] [facility]/ []  Follow-up with PCP Additional Notes: patient called with husband on the phone. Patient was seen on 01/20/2024 and dx with UTI. Patient has had two rounds of antibiotics with no improvement to symptoms. Patient endorses constant lower back pain that has a base of 3 out of 10 which increases with movement. Patient states she was unable to sleep. Per protocol, patient is recommended to be seen today. Appointment made for 3:10 PM today with patient's PCP. Husband goes to same PCP and is asking if his appointment at 11:00 could be given to patient and he take her later appointment. Husband is asking for a phone call from PCP staff [phone] to see if this is possible. Patient verbalized understanding of plan and all questions answered.    Copied from CRM [id number]. Topic: Clinical - Red Word Triage >> Feb 02, 2024  8:34 AM Izetta Dakin wrote: [facility] that prompted transfer to Nurse Triage: severe pain in back, abdomen, and genital area Reason for Disposition  [1] SEVERE back pain (e.g., excruciating, unable to do any normal activities) AND [2] not improved 2 hours after pain medicine  Answer Assessment - Initial Assessment Questions 1. ONSET: "When did the pain begin?"      Started a month ago 2. LOCATION: "Where does it hurt?" (upper, mid or lower back)     Lower back  3. SEVERITY: "How bad is the pain?"  (e.g., Scale 1-10; mild, moderate, or severe)   - MILD (1-3): Doesn't interfere with normal activities.    - MODERATE (4-7): Interferes with normal activities or awakens from sleep.    - SEVERE (8-10): Excruciating pain, unable to do any normal activities.       6 out of 10 4. PATTERN: "Is the pain constant?" (e.g., yes, no; constant, intermittent)      Baseline pain level which becomes worse with movement 5. RADIATION: "Does the pain shoot into your legs or somewhere else?"     no 6. CAUSE:  "What do you think is causing the back pain?"      unsure 7. BACK OVERUSE:  "Any recent lifting of heavy objects, strenuous work or exercise?"     no 8. MEDICINES: "What have you taken so far for the pain?" (e.g., nothing, acetaminophen, NSAIDS)     Motrin 600 mg 9. NEUROLOGIC SYMPTOMS: "Do you have any weakness, numbness, or problems with bowel/bladder control?"     no 10. OTHER SYMPTOMS: "Do you have any other symptoms?" (e.g., fever, abdomen pain, burning with urination, blood in urine)       Abdomen pain, genital pain  Protocols used: Back Pain-A-AH

## 2024-02-02 NOTE — Telephone Encounter (Signed)
 I spoke with her husband she will keep the appointment this afternoon we will see her then thank you

## 2024-02-02 NOTE — Progress Notes (Signed)
 Subjective:    Patient ID: Summer [name], female    DOB: 1961-01-05, 63 y.o.   MRN: [id number]  HPI Back pain going down both sides of back and low back  Comp 2 x abx 5 days each  Discussed the use of AI scribe software for clinical note transcription with the patient, who gave verbal consent to proceed.  History of Present Illness   Summer [name] is a 63 year old female who presents with widespread body pain and tenderness.  She experiences widespread body pain and tenderness, primarily located in her back, stomach, ribs, and legs. Her legs are particularly sensitive to touch, causing discomfort even with minimal contact. The pain intensifies with movement and is localized to the mentioned regions, affecting her sleep.  She has been taking a prescribed medication without relief and has not previously used gabapentin. She is considering other medication options.  She experiences significant nausea, described as 'bad', without vomiting or diarrhea. No current medication is being taken for nausea.  Her past medical history includes autoimmune thyroid disease with previously elevated thyroid peroxidase antibodies. She is concerned about her thyroid medication due to elevated T3 levels, although her free T4 and TSH are within normal ranges.  Family history is notable for a cousin who passed away from scleroderma, which severely affected her lungs.       Review of Systems     Objective:   Physical Exam  General-in no acute distress Eyes-no discharge Lungs-respiratory rate normal, CTA CV-no murmurs,RRR Extremities skin warm dry no edema Neuro grossly normal Behavior normal, alert Patient no significant tenderness in legs arms lower back abdomen extremities no guarding or rebound      Assessment & Plan:  1. Myalgia (Primary) Tenderness could be autoimmune but could also be increased inflammation could also be fibromyalgia check lab work - CK - C-reactive protein -  Sedimentation Rate - ANA - CBC with Differential - Comprehensive metabolic panel with GFR - Lipase  2. Lumbar pain Gentle stretching if persistent problems consider physical therapy  3. Abdominal pain, unspecified abdominal location Radiation mainly from the back I think is reasonable to see the gynecologist to make sure there is not a secondary issue pelvic Lab work ordered - CK - C-reactive protein - Sedimentation Rate - ANA - CBC with Differential - Comprehensive metabolic panel with GFR - Lipase - Urine Culture  4. Urinary frequency Will send for culture but I do not see ongoing infection currently - POCT URINALYSIS DIP (CLINITEK) - Urine Culture  Assessment and Plan    Chronic Pain Syndrome Widespread pain likely due to nerve overstimulation, consistent with fibromyalgia. Autoimmune conditions less likely due to normal ANA, CRP, and sed rate in the past - Prescribe Lyrica at low dose for nerve-related pain. - Order additional blood work to reassess inflammatory markers. - Consider referral to autoimmune specialist if blood work is abnormal. - Advise use of MyChart to report Lyrica effectiveness in 2-3 weeks.  Nausea Significant nausea potentially related to chronic pain or other conditions. - Consider prescribing antiemetic if symptoms persist.  Autoimmune Thyroiditis Elevated thyroid peroxidase antibodies with normal free T4 and TSH, elevated T3. Current thyroid medication is safe to continue. - Continue current thyroid medication until endocrinology consultation.  General Health Maintenance Limited evidence for fish oil efficacy in reducing inflammation. - Discuss limited evidence for fish oil in reducing inflammation.  Follow-up Advised to follow up with OB/GYN for pelvic health evaluation and use MyChart for Lyrica communication. -  Advise appointment with OB/GYN for pelvic health evaluation. - Use MyChart for follow-up communication regarding Lyrica's  effectiveness.     Urine under the microscope did show occasional WBC but also shows epithelial cells mainly no obvious bacteria awaiting urine culture

## 2024-02-03 ENCOUNTER — Encounter: Payer: Self-pay | Admitting: Family Medicine

## 2024-02-03 LAB — COMPREHENSIVE METABOLIC PANEL WITH GFR
ALT: 23 IU/L (ref 0–32)
AST: 25 IU/L (ref 0–40)
Albumin: 4.4 g/dL (ref 3.8–4.9)
Alkaline Phosphatase: 113 IU/L (ref 44–121)
BUN/Creatinine Ratio: 19 (ref 12–28)
BUN: 15 mg/dL (ref 8–27)
Bilirubin Total: 0.6 mg/dL (ref 0.0–1.2)
CO2: 19 mmol/L — ABNORMAL LOW (ref 20–29)
Calcium: 9.4 mg/dL (ref 8.7–10.3)
Chloride: 103 mmol/L (ref 96–106)
Creatinine, Ser: 0.79 mg/dL (ref 0.57–1.00)
Globulin, Total: 2.2 g/dL (ref 1.5–4.5)
Glucose: 111 mg/dL — ABNORMAL HIGH (ref 70–99)
Potassium: 4.2 mmol/L (ref 3.5–5.2)
Sodium: 139 mmol/L (ref 134–144)
Total Protein: 6.6 g/dL (ref 6.0–8.5)
eGFR: 86 mL/min/{1.73_m2} (ref >59)

## 2024-02-03 LAB — CBC WITH DIFFERENTIAL/PLATELET
Basophils Absolute: 0.1 10*3/uL (ref 0.0–0.2)
Basos: 1 %
EOS (ABSOLUTE): 0.2 10*3/uL (ref 0.0–0.4)
Eos: 2 %
Hematocrit: 49.7 % — ABNORMAL HIGH (ref 34.0–46.6)
Hemoglobin: 17.1 g/dL — ABNORMAL HIGH (ref 11.1–15.9)
Immature Grans (Abs): 0 10*3/uL (ref 0.0–0.1)
Immature Granulocytes: 0 %
Lymphocytes Absolute: 2.7 10*3/uL (ref 0.7–3.1)
Lymphs: 37 %
MCH: 31.1 pg (ref 26.6–33.0)
MCHC: 34.4 g/dL (ref 31.5–35.7)
MCV: 90 fL (ref 79–97)
Monocytes Absolute: 0.6 10*3/uL (ref 0.1–0.9)
Monocytes: 8 %
Neutrophils Absolute: 3.8 10*3/uL (ref 1.4–7.0)
Neutrophils: 52 %
Platelets: 287 10*3/uL (ref 150–450)
RBC: 5.5 x10E6/uL — ABNORMAL HIGH (ref 3.77–5.28)
RDW: 14 % (ref 11.7–15.4)
WBC: 7.4 10*3/uL (ref 3.4–10.8)

## 2024-02-03 LAB — SEDIMENTATION RATE: Sed Rate: 12 mm/h (ref 0–40)

## 2024-02-03 LAB — CK: Total CK: 55 U/L (ref 32–182)

## 2024-02-03 LAB — LIPASE: Lipase: 21 U/L (ref 14–72)

## 2024-02-03 LAB — ANA: Anti Nuclear Antibody (ANA): NEGATIVE

## 2024-02-03 LAB — C-REACTIVE PROTEIN: CRP: 9 mg/L (ref 0–10)

## 2024-02-05 LAB — URINE CULTURE

## 2024-02-06 ENCOUNTER — Encounter (HOSPITAL_COMMUNITY): Payer: Self-pay

## 2024-02-06 ENCOUNTER — Ambulatory Visit (INDEPENDENT_AMBULATORY_CARE_PROVIDER_SITE_OTHER): Payer: Medicaid Other | Admitting: Psychiatry

## 2024-02-06 DIAGNOSIS — F411 Generalized anxiety disorder: Secondary | ICD-10-CM
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
## 2024-02-13 LAB — CBC
Hematocrit: 50.8 % — ABNORMAL HIGH (ref 34.0–46.6)
Hemoglobin: 17.1 g/dL — ABNORMAL HIGH (ref 11.1–15.9)
MCH: 31 pg (ref 26.6–33.0)
MCHC: 33.7 g/dL (ref 31.5–35.7)
MCV: 92 fL (ref 79–97)
Platelets: 275 10*3/uL (ref 150–450)
RBC: 5.51 x10E6/uL — ABNORMAL HIGH (ref 3.77–5.28)
RDW: 14.4 % (ref 11.7–15.4)
WBC: 7.5 10*3/uL (ref 3.4–10.8)

## 2024-02-13 LAB — THYROGLOBULIN LEVEL: Thyroglobulin (TG-RIA): 27 ng/mL

## 2024-02-13 LAB — TSH+FREE T4
Free T4: 1.24 ng/dL (ref 0.82–1.77)
TSH: 3.54 u[IU]/mL (ref 0.450–4.500)

## 2024-02-13 LAB — T3: T3, Total: 130 ng/dL (ref 71–180)

## 2024-02-13 LAB — SEDIMENTATION RATE: Sed Rate: 21 mm/h (ref 0–40)

## 2024-02-13 LAB — THYROID PEROXIDASE ANTIBODY: Thyroperoxidase Ab SerPl-aCnc: >600 [IU]/mL — ABNORMAL HIGH (ref 0–34)

## 2024-02-13 LAB — C-REACTIVE PROTEIN: CRP: 9 mg/L (ref 0–10)

## 2024-02-16 ENCOUNTER — Other Ambulatory Visit: Payer: Self-pay

## 2024-02-16 DIAGNOSIS — D582 Other hemoglobinopathies: Secondary | ICD-10-CM

## 2024-02-17 ENCOUNTER — Inpatient Hospital Stay: Payer: Medicaid Other | Attending: Hematology

## 2024-02-17 DIAGNOSIS — D8940 Mast cell activation, unspecified: Secondary | ICD-10-CM | POA: Insufficient documentation

## 2024-02-17 DIAGNOSIS — D582 Other hemoglobinopathies: Secondary | ICD-10-CM

## 2024-02-17 DIAGNOSIS — D751 Secondary polycythemia: Secondary | ICD-10-CM | POA: Diagnosis present

## 2024-02-17 LAB — CBC WITH DIFFERENTIAL/PLATELET
Abs Immature Granulocytes: 0.02 10*3/uL (ref 0.00–0.07)
Basophils Absolute: 0.1 10*3/uL (ref 0.0–0.1)
Basophils Relative: 1 %
Eosinophils Absolute: 0.3 10*3/uL (ref 0.0–0.5)
Eosinophils Relative: 4 %
HCT: 49.6 % — ABNORMAL HIGH (ref 36.0–46.0)
Hemoglobin: 16.2 g/dL — ABNORMAL HIGH (ref 12.0–15.0)
Immature Granulocytes: 0 %
Lymphocytes Relative: 37 %
Lymphs Abs: 3 10*3/uL (ref 0.7–4.0)
MCH: 30.2 pg (ref 26.0–34.0)
MCHC: 32.7 g/dL (ref 30.0–36.0)
MCV: 92.4 fL (ref 80.0–100.0)
Monocytes Absolute: 0.6 10*3/uL (ref 0.1–1.0)
Monocytes Relative: 7 %
Neutro Abs: 4.1 10*3/uL (ref 1.7–7.7)
Neutrophils Relative %: 51 %
Platelets: 247 10*3/uL (ref 150–400)
RBC: 5.37 MIL/uL — ABNORMAL HIGH (ref 3.87–5.11)
RDW: 15.1 % (ref 11.5–15.5)
WBC: 8.1 10*3/uL (ref 4.0–10.5)
nRBC: 0 % (ref 0.0–0.2)

## 2024-02-17 LAB — COMPREHENSIVE METABOLIC PANEL WITH GFR
ALT: 21 U/L (ref 0–44)
AST: 20 U/L (ref 15–41)
Albumin: 3.6 g/dL (ref 3.5–5.0)
Alkaline Phosphatase: 95 U/L (ref 38–126)
Anion gap: 6 (ref 5–15)
BUN: 20 mg/dL (ref 6–20)
CO2: 26 mmol/L (ref 22–32)
Calcium: 9.3 mg/dL (ref 8.9–10.3)
Chloride: 106 mmol/L (ref 98–111)
Creatinine, Ser: 0.81 mg/dL (ref 0.44–1.00)
GFR, Estimated: >60 mL/min (ref >60)
Glucose, Bld: 174 mg/dL — ABNORMAL HIGH (ref 70–99)
Potassium: 4.1 mmol/L (ref 3.5–5.1)
Sodium: 138 mmol/L (ref 135–145)
Total Bilirubin: 0.7 mg/dL (ref 0.0–1.2)
Total Protein: 6.6 g/dL (ref 6.5–8.1)

## 2024-02-20 ENCOUNTER — Ambulatory Visit (INDEPENDENT_AMBULATORY_CARE_PROVIDER_SITE_OTHER): Admitting: Obstetrics & Gynecology

## 2024-02-20 ENCOUNTER — Encounter (INDEPENDENT_AMBULATORY_CARE_PROVIDER_SITE_OTHER): Payer: Self-pay | Admitting: Gastroenterology

## 2024-02-20 ENCOUNTER — Ambulatory Visit (INDEPENDENT_AMBULATORY_CARE_PROVIDER_SITE_OTHER): Payer: Medicaid Other | Admitting: Gastroenterology

## 2024-02-20 ENCOUNTER — Encounter: Payer: Self-pay | Admitting: Obstetrics & Gynecology

## 2024-02-20 ENCOUNTER — Ambulatory Visit (INDEPENDENT_AMBULATORY_CARE_PROVIDER_SITE_OTHER): Payer: Medicaid Other | Admitting: Psychiatry

## 2024-02-20 VITALS — BP 180/118 | HR 101 | Wt 307.4 lb

## 2024-02-20 VITALS — BP 153/97 | HR 80 | Temp 98.0°F | Ht 64.0 in | Wt 308.3 lb

## 2024-02-20 DIAGNOSIS — I10 Essential (primary) hypertension: Secondary | ICD-10-CM

## 2024-02-20 DIAGNOSIS — R1030 Lower abdominal pain, unspecified: Secondary | ICD-10-CM | POA: Diagnosis not present

## 2024-02-20 DIAGNOSIS — N3010 Interstitial cystitis (chronic) without hematuria: Secondary | ICD-10-CM | POA: Diagnosis not present

## 2024-02-20 DIAGNOSIS — K824 Cholesterolosis of gallbladder: Secondary | ICD-10-CM

## 2024-02-20 DIAGNOSIS — R1310 Dysphagia, unspecified: Secondary | ICD-10-CM

## 2024-02-20 DIAGNOSIS — K219 Gastro-esophageal reflux disease without esophagitis: Secondary | ICD-10-CM

## 2024-02-20 DIAGNOSIS — K760 Fatty (change of) liver, not elsewhere classified: Secondary | ICD-10-CM

## 2024-02-20 DIAGNOSIS — E669 Obesity, unspecified: Secondary | ICD-10-CM | POA: Diagnosis not present

## 2024-02-20 DIAGNOSIS — R110 Nausea: Secondary | ICD-10-CM

## 2024-02-20 DIAGNOSIS — R140 Abdominal distension (gaseous): Secondary | ICD-10-CM | POA: Diagnosis not present

## 2024-02-20 DIAGNOSIS — R102 Pelvic and perineal pain: Secondary | ICD-10-CM | POA: Diagnosis not present

## 2024-02-20 DIAGNOSIS — F411 Generalized anxiety disorder: Secondary | ICD-10-CM

## 2024-02-20 DIAGNOSIS — D8940 Mast cell activation, unspecified: Secondary | ICD-10-CM | POA: Diagnosis not present

## 2024-02-20 DIAGNOSIS — M797 Fibromyalgia: Secondary | ICD-10-CM

## 2024-02-20 DIAGNOSIS — Z6841 Body Mass Index (BMI) 40.0 and over, adult: Secondary | ICD-10-CM | POA: Diagnosis not present

## 2024-02-20 DIAGNOSIS — E663 Overweight: Secondary | ICD-10-CM

## 2024-02-20 MED ORDER — DEXILANT 60 MG PO CPDR: 60.0000 mg | DELAYED_RELEASE_CAPSULE | Freq: Every day | ORAL | 1 refills | Status: DC

## 2024-02-20 NOTE — Patient Instructions (Signed)
 Continue famotidine  40mg  twice daily I have sent dexilant  to see if we can get this covered for your acid reflux Continue to try and avoid eating late and staying upright 2-3 hours after eating prior to laying down I will refer you to nutritionist to see if they can help with dietary needs/weight management  I have also provided the mediterranean diet for you to look at and compare to your mast cell diet  We will update US  in regards to your gallbladder polyp If swallowing becomes worse, please let me know, otherwise we can plan to do EGD at time of colonoscopy in January 2026  Follow up 6 months  It was a pleasure to see you today. I want to create trusting relationships with patients and provide genuine, compassionate, and quality care. I truly value your feedback! please be on the lookout for a survey regarding your visit with me today. I appreciate your input about our visit and your time in completing this!    Millianna Szymborski L. Brinly Maietta, MSN, APRN, AGNP-C Adult-Gerontology Nurse Practitioner [facility]

## 2024-02-20 NOTE — Progress Notes (Signed)
 Referring Provider: Bennet Brasil, MD Primary Care Physician:  Bennet Brasil, MD Primary GI Physician: DR. Sammi Crick   Chief Complaint  Patient presents with   Follow-up    Patient here today for a follow up. Patient still having lower abdominal pain, and dysphagia, and having issues with refluxing her food back up. She says she is taking pepcid  40 mg bid.   HPI:   Summer [name] is a 63 y.o. female with past medical history of heart failure (normal EF July 2022), Fibromyalgia, Bell's palsy, hypothyroidism, HTN.    Patient presenting today for:  Follow up of nausea, abdominal pain/bloating, GERD  Last seen August 2024, at that time she reports she has never heard from [facility] regarding SIBO testing.  Symptoms improved some though still having nausea.  Trying to do histamine diet.  She is gaining weight.  Ongoing bloating and abdominal cramping that is quite improved with mast cell treatment antihistamines.  Feel Pepcid  worked better for her.  Having both constipation and diarrhea and orange-colored stools.  Patient recommended to have testing, increase Pepcid  40 mg to twice daily, schedule EGD if dysphagia worsens, good reflux precautions reevaluation of gallbladder lesion via ultrasound in May 2025  SIBO testing done in January 2025 was negative  Present: Still having some bloating but not as bad as previously. Taking famotidine  40mg  BID and feels this has helped a lot. Not having as much regurgitation as she was previously. She is still having some dysphagia, occurs intermittently, may happen a few times a week and then may not have any issues for a while. At this time she wants to wait until colonoscopy is due to have EGD as she is concerned about anesthesia. She wonders if this is related to her Mast cell disease which has been difficult to treat. There has been discussion about sending her to [facility] for further management though they have not sent her yet.  She is still having  lower abdominal pain, almost daily. Can sometimes be worse after eating, states she has had this since prior to her initial colonoscopy. She continues to have nausea, fairly often.   She states she is still having orange stools, she notes that she continues to have fluctuation of constipation and diarrhea. She has been doing dandelion tea to help cleanse her liver and states this sometimes seems to help her stools not be orange.    Last Colonoscopy:11/24/21- Hemorrhoids found on perianal exam. - Six 3 to 8 mm polyps in the transverse colon, in the ascending colon and in the cecum, - Two 1 to 2 mm polyps in the transverse colon - Medium-sized lipoma in the transverse colon. - Three 3 to 5 mm polyps in the rectum and in the sigmoid colon - The entire examined colon is normal. Biopsied-normal - Internal hemorrhoids.  (total of 9 tubular adenomas, one submucosal lipoma and 1 hyperplastic polyp) Last Endoscopy:11/24/21 2 cm hiatal hernia. - Normal stomach. Biopsied-normal-negative for h pylori - Normal examined duodenum. Biopsied-normal- no celiac   Recommended repeat Colonoscopy in 3 years   Past Medical History:  Diagnosis Date   Arthritis    Bell's palsy    Chest pain, unspecified    Dysrhythmia, cardiac    Fibromyalgia    Hashimoto's disease    History of CT scan 06/2017   "coronary CT scan on 06/01/2017, this did not find any cardiac calcifications. Coronary calcium  score of zero."   History of Holter monitoring    Hypertension  Mast cell activation syndrome (HCC)    Sleep apnea    Thyroid  disease    Urticaria     Past Surgical History:  Procedure Laterality Date   BIOPSY  11/24/2021   Procedure: BIOPSY;  Surgeon: Urban Garden, MD;  [facility]: [facility];  Service: Gastroenterology;;   COLONOSCOPY N/A 01/23/2014   Procedure: COLONOSCOPY;  Surgeon: Ruby Corporal, MD;  Location: [facility];  Service: Endoscopy;  Laterality: N/A;  200   COLONOSCOPY WITH  PROPOFOL  N/A 11/24/2021   Procedure: COLONOSCOPY WITH PROPOFOL ;  Surgeon: Urban Garden, MD;  Location: [facility];  Service: Gastroenterology;  Laterality: N/A;  205   ESOPHAGOGASTRODUODENOSCOPY (EGD) WITH PROPOFOL  N/A 11/24/2021   Procedure: ESOPHAGOGASTRODUODENOSCOPY (EGD) WITH PROPOFOL ;  Surgeon: Urban Garden, MD;  [facility]: [facility];  Service: Gastroenterology;  Laterality: N/A;   HYSTEROSCOPY WITH D & C N/A 03/22/2023   Procedure: DILATATION AND CURETTAGE /HYSTEROSCOPY WITH MYOSURE;  Surgeon: Ozan, Jennifer, DO;  Location: [facility];  Service: Gynecology;  Laterality: N/A;   POLYPECTOMY  11/24/2021   Procedure: POLYPECTOMY;  Surgeon: Umberto Ganong, Bearl Limes, MD;  Location: [facility];  Service: Gastroenterology;;   TONSILLECTOMY      Current Outpatient Medications  Medication Sig Dispense Refill   albuterol  (PROVENTIL ) (2.5 MG/3ML) 0.083% nebulizer solution USE 1 VIAL IN NEBULIZER EVERY 6 HOURS AS NEEDED FOR WHEEZING FOR SHORTNESS OF BREATH 75 mL 0   ALPRAZolam  (XANAX ) 0.5 MG tablet Take 1 tablet by mouth twice daily as needed for anxiety 20 tablet 0   Ascorbic Acid (VITAMIN C) 500 MG CAPS Take 500 mg by mouth daily.     B Complex-C (SUPER B COMPLEX PO) Take 1 tablet by mouth daily at 6 (six) AM.     budesonide -formoterol  (SYMBICORT ) 80-4.5 MCG/ACT inhaler Inhale 2 puffs twice a day with spacer to help prevent cough and wheeze 1 each 5   cetirizine  (ZYRTEC ) 10 MG tablet Take 2 tablets (20 mg total) by mouth 2 (two) times daily. 60 tablet 5   Cholecalciferol (VITAMIN D-3) 5000 UNITS TABS Take 5,000 Units by mouth daily.     cromolyn  (GASTROCROM ) 100 MG/5ML solution TAKE 5 ML BY MOUTH  4 TIMES DAILY( BEFORE MEAL(S) AND AT BEDTIME). 600 mL 5   cromolyn  (INTAL ) 20 MG/2ML nebulizer solution Take 2 mLs (20 mg total) by nebulization in the morning, at noon, in the evening, and at bedtime. (Patient taking differently: Take 20 mg by nebulization 3 (three) times daily  as needed (Asthma mastel activation syndrome).) 120 mL 5   EPINEPHrine  0.3 mg/0.3 mL IJ SOAJ injection INJECT CONTENTS OF 1 PEN AS NEEDED FOR ANAPHYLAXIS 2 each 1   famotidine  (PEPCID ) 40 MG tablet Take 1 tablet (40 mg total) by mouth 2 (two) times daily. 60 tablet 5   fluticasone  (FLONASE ) 50 MCG/ACT nasal spray Place 2 sprays into both nostrils daily. 18.2 mL 2   furosemide  (LASIX ) 20 MG tablet 1  qam prn edema 30 tablet 5   hydrOXYzine  (ATARAX ) 25 MG tablet Take 1 tablet (25 mg total) by mouth at bedtime. 60 tablet 5   ibuprofen  (ADVIL ) 600 MG tablet Take 1 tablet (600 mg total) by mouth every 8 (eight) hours as needed. 30 tablet 2   levocetirizine (XYZAL ) 5 MG tablet Take one to two tablets at night. 360 tablet 1   Magnesium 200 MG TABS Take 200 mg by mouth 2 (two) times daily.     Naltrexone  HCl, Pain, 4.5 MG  CAPS Take contents of half a capsule nightly for two weeks and then increase to one capsule nightly thereafter. (Patient not taking: Reported on 01/20/2024) 90 capsule 0   Omega-3 Fatty Acids (FISH OIL) 1200 MG CAPS Take 1,200 mg by mouth daily.     ondansetron  (ZOFRAN ) 8 MG tablet Take 1 tablet (8 mg total) by mouth every 8 (eight) hours as needed for nausea or vomiting. 14 tablet 4   potassium chloride  SA (KLOR-CON  M) 20 MEQ tablet 1 every day prn when taking lasix  30 tablet 5   pregabalin  (LYRICA ) 25 MG capsule Take 1 capsule (25 mg total) by mouth 2 (two) times daily. 60 capsule 3   Probiotic Product (PROBIOTIC PO) Take 100 mg by mouth daily.     Quercetin 500 MG CAPS Take 500 mg by mouth daily.     RESTASIS 0.05 % ophthalmic emulsion Place 1 drop into both eyes daily.     rosuvastatin  (CRESTOR ) 5 MG tablet Take 1 tablet (5 mg total) by mouth daily. 30 tablet 5   thyroid  (NP THYROID ) 60 MG tablet TAKE 1 TABLET BY MOUTH ONCE DAILY BEFORE BREAKFAST 30 tablet 6   VENTOLIN  HFA 108 (90 Base) MCG/ACT inhaler INHALE 2 PUFFS BY MOUTH EVERY 6 HOURS AS NEEDED FOR WHEEZING AND FOR SHORTNESS OF  BREATH 18 g 0   No current facility-administered medications for this visit.    Allergies as of 02/20/2024 - Review Complete 02/20/2024  Allergen Reaction Noted   Bee venom Anaphylaxis and Hives 09/06/2012   Molds & smuts Shortness Of Breath 11/20/2021   Shellfish allergy Anaphylaxis and Hives 09/06/2012   Avelox [moxifloxacin hcl in nacl] Other (See Comments) 11/22/2013   Moxifloxacin Other (See Comments) 11/22/2013   Ciprofloxacin Other (See Comments) 01/31/2023   Biaxin  [clarithromycin ]  11/29/2022   Tape Rash 11/20/2021    Social History   Socioeconomic History   Marital status: Married    Spouse name: Not on file   Number of children: Not on file   Years of education: Not on file   Highest education level: 12th grade  Occupational History   Not on file  Tobacco Use   Smoking status: Former    Current packs/day: 0.00    Average packs/day: 1 pack/day for 20.0 years (20.0 ttl pk-yrs)    Types: Cigarettes    Start date: 11/08/1983    Quit date: 11/08/2003    Years since quitting: 20.2    Passive exposure: Current   Smokeless tobacco: Never   Tobacco comments:    quit smoking 10 yrs   Vaping Use   Vaping status: Never Used  Substance and Sexual Activity   Alcohol use: No   Drug use: No   Sexual activity: Not Currently    Birth control/protection: Post-menopausal  Other Topics Concern   Not on file  Social History Narrative   Not on file   Social Drivers of Health   Financial Resource Strain: High Risk (12/14/2023)   Overall Financial Resource Strain (CARDIA)    Difficulty of Paying Living Expenses: Very hard  Food Insecurity: Food Insecurity Present (12/14/2023)   Hunger Vital Sign    Worried About Running Out of Food in the Last Year: Often true    Ran Out of Food in the Last Year: Often true  Transportation Needs: No Transportation Needs (12/14/2023)   PRAPARE - Administrator, Civil Service (Medical): No    Lack of Transportation (Non-Medical): No   Physical Activity: Unknown (12/14/2023)  Exercise Vital Sign    Days of Exercise per Week: 0 days    Minutes of Exercise per Session: Not on file  Stress: Stress Concern Present (12/14/2023)   [employer] of Occupational Health - Occupational Stress Questionnaire    Feeling of Stress : Very much  Social Connections: Moderately Integrated (12/14/2023)   Social Connection and Isolation Panel [NHANES]    Frequency of Communication with Friends and Family: Twice a week    Frequency of Social Gatherings with Friends and Family: Once a week    Attends Religious Services: 1 to 4 times per year    Active Member of [employer] or Organizations: No    Attends Engineer, structural: Not on file    Marital Status: Married    Review of systems General: negative for malaise, night sweats, fever, chills, weight loss Neck: Negative for lumps, goiter, pain and significant neck swelling Resp: Negative for cough, wheezing, dyspnea at rest CV: Negative for chest pain, leg swelling, palpitations, orthopnea GI: denies melena, hematochezia, odyonophagia, early satiety or unintentional weight loss. +diarrhea +constipation +dysphagia +GERD +orange stools +nausea MSK: Negative for joint pain or swelling, back pain, and muscle pain. Derm: Negative for itching or rash Psych: Denies depression, anxiety, memory loss, confusion. No homicidal or suicidal ideation.  Heme: Negative for prolonged bleeding, bruising easily, and swollen nodes. Endocrine: Negative for cold or heat intolerance, polyuria, polydipsia and goiter. Neuro: negative for tremor, gait imbalance, syncope and seizures. The remainder of the review of systems is noncontributory.  Physical Exam: BP (!) 162/101 (BP Location: Left Arm, Patient Position: Sitting, Cuff Size: Small)   Pulse 78   Temp 98 F (36.7 C) (Temporal)   Ht 5\' 4"  (1.626 m)   Wt (!) 308 lb 4.8 oz (139.8 kg)   LMP 01/23/2014 Comment: spotting  BMI 52.92 kg/m  General:    Alert and oriented. No distress noted. Pleasant and cooperative.  Head:  Normocephalic and atraumatic. Eyes:  Conjuctiva clear without scleral icterus. Mouth:  Oral mucosa pink and moist. Good dentition. No lesions. Heart: Normal rate and rhythm, s1 and s2 heart sounds present.  Lungs: Clear lung sounds in all lobes. Respirations equal and unlabored. Abdomen:  +BS, soft, non-tender and non-distended. No rebound or guarding. No HSM or masses noted. Neurologic:  Alert and  oriented x4 Psych:  Alert and cooperative. Normal mood and affect.  Invalid input(s): "6 MONTHS"   ASSESSMENT: Summer [name] is a 63 y.o. female presenting today nausea, abdominal pain/bloating and GERD/dysphagia   GERD/Dysphagia: advised to increase famotidine  to 40mg  BID at last visit which she feels has helped quite a bit though still having GERD symptoms multiple times per week. Has failed protonix , omeprazole  in the past. Discussed proceeding with EGD given her dysphagia, however, she would like to hold off on this to see if increased in H2B improves her symptoms, she will make me aware if dysphagia is not improving or she has worsening symptoms. Will try adding dexilant  60mg  daily to see if this better controls her Reflux   Abdominal pain/bloating/nausea: ongoing abdominal pain, nausea and bloating though better since doing low histamine diet and on treatment for Mast Cell activation. EGD in 2023 was negative for celiacs/H pylori, she was referred for SIBO testing at last OV, which was negative. Suspect symptoms are likely secondary to IBS and may be influenced by her Mast Cell disease. Should continue with mast cell diet as she is doing  Fatty liver/obesity: pt trying to lose  weight though has had a difficult time, especially with dietary restrictions given mast cell disease. She is following mast cell diet. We discussed comparing mediterranean diet as she may be able to implement some of this as well. I also recommended  she meet with dietician as this may be helpful with her dietary restrictions and better weight management.   GB lesion: UltraSound done October 2023 with small GB polyp, she had repeat US  in May 2024 with similar appearing 4mm lesion, polyp vs focal cholesterolosis, some hepatic steatosis. Recommend repeat US  in 1 year to follow for changes in GB lesion. In regards to fatty liver, She is trying to manage her weight as best she can but notes due to her Hashimoto's disease this has been difficult.    PLAN:  -repeat US  for GB lesion -continue pepcid  40mg  BID -mediterranean diet/mast cell diet - plan for EGD and Colonoscopy January 2026 -referral to dietician -dexilant  60mg  daily -good reflux precautions - pt to make me aware if dysphagia worsens   All questions were answered, patient verbalized understanding and is in agreement with plan as outlined above.   Follow Up: 6 months   Summer Rohlman L. Adrien Alberta, MSN, APRN, AGNP-C Adult-Gerontology Nurse Practitioner [facility]  I have reviewed the note and agree with the APP's assessment as described in this progress note  Samantha Cress, MD Gastroenterology and Hepatology [facility]
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
## 2024-02-20 NOTE — Progress Notes (Signed)
 GYN VISIT Patient name: Summer [name] MRN [id number]  Date of birth: 03/02/1961 Chief Complaint:   Pelvic Pain  History of Present Illness:   Summer [name] is a 63 y.o. [id number] PM female being seen today for the following concerns:  Pelvic pain:     About a month ago, noted low back pain followed by pelvic pain that was on both her left and right side. Seen by PCP, diagnosed with UTI- treated x 2.  Pain improved, but pain still present.  Rates her pain 3-4/10.  Initially pain was a dull ache on her side, but notes h/o sharp pain on her sides for years.  Recently has felt sharp pain- described as twinges that were intermittent for the past week.  It has been better than it was, but still present.  She also notes that the pain radiates to her sides and hips.  Denies vagina bleeding.  Denies vaginal discharge or irritation.  She does urinary leakage, wears pads so some irritation from that.  Of note, pt with multiple comorbidities including fibromyalgia, mast cell activation syndrome, arthritis and obesity  Patient's last menstrual period was 01/23/2014.    Review of Systems:   Pertinent items are noted in HPI Denies fever/chills, dizziness, headaches, visual disturbances, fatigue, shortness of breath, chest pain, abdominal pain, vomiting. Pertinent History Reviewed:   Past Surgical History:  Procedure Laterality Date   BIOPSY  11/24/2021   Procedure: BIOPSY;  Surgeon: Urban Garden, MD;  [facility]: [facility];  Service: Gastroenterology;;   COLONOSCOPY N/A 01/23/2014   Procedure: COLONOSCOPY;  Surgeon: Ruby Corporal, MD;  [facility]: [facility];  Service: Endoscopy;  Laterality: N/A;  200   COLONOSCOPY WITH PROPOFOL  N/A 11/24/2021   Procedure: COLONOSCOPY WITH PROPOFOL ;  Surgeon: Urban Garden, MD;  Location: [facility];  Service: Gastroenterology;  Laterality: N/A;  205   ESOPHAGOGASTRODUODENOSCOPY (EGD) WITH PROPOFOL  N/A 11/24/2021   Procedure:  ESOPHAGOGASTRODUODENOSCOPY (EGD) WITH PROPOFOL ;  Surgeon: Urban Garden, MD;  [facility]: [facility];  Service: Gastroenterology;  Laterality: N/A;   HYSTEROSCOPY WITH D & C N/A 03/22/2023   Procedure: DILATATION AND CURETTAGE /HYSTEROSCOPY WITH MYOSURE;  Surgeon: Wilhelmine Krogstad, DO;  Location: [facility];  Service: Gynecology;  Laterality: N/A;   POLYPECTOMY  11/24/2021   Procedure: POLYPECTOMY;  Surgeon: Umberto Ganong, Bearl Limes, MD;  Location: [facility];  Service: Gastroenterology;;   TONSILLECTOMY      Past Medical History:  Diagnosis Date   Arthritis    Bell's palsy    Chest pain, unspecified    Dysrhythmia, cardiac    Fibromyalgia    Hashimoto's disease    History of CT scan 06/2017   "coronary CT scan on 06/01/2017, this did not find any cardiac calcifications. Coronary calcium  score of zero."   History of Holter monitoring    Hypertension    Mast cell activation syndrome (HCC)    Sleep apnea    Thyroid  disease    Urticaria    Reviewed problem list, medications and allergies. Physical Assessment:   Vitals:   02/20/24 1435 02/20/24 1509  BP: (!) 171/98 (!) 180/118  Pulse: (!) 104 (!) 101  Weight: (!) 307 lb 6.4 oz (139.4 kg)   Body mass index is 52.77 kg/m.       Physical Examination:   General appearance: alert, well appearing, and in no distress  Psych: mood appropriate, normal affect  Skin: warm & dry   Cardiovascular: normal heart rate noted  Respiratory: normal respiratory effort, no distress  Abdomen: obese, soft, no rebound, no guarding, no reproducible pain  Pelvic: VULVA: normal appearing vulva with no masses, tenderness or lesions, VAGINA: normal appearing vagina with normal color and discharge, no lesions, CERVIX: normal appearing cervix without discharge or lesions, UTERUS: uterus is normal size, shape, consistency and nontender, ADNEXA: normal adnexa in size, nontender and no masses.  Bimanual exam limited due to body habitus.  No evidence of  cystocele or uterine prolapse  Extremities: no calf tenderness bilaterally  Chaperone:  pt declined     Assessment & Plan:  1) Pelvic pain - Long discussion with patient regarding pelvic pain and discussed that based on her past medical history there are multiple factors that may playing into her discomfort - Will plan for pelvic ultrasound to rule out underlying etiology specifically ovarian mass.  Reassured patient that prior ultrasound completed in 2023 showed normal ovaries - Reviewed her concerns regarding thickened endometrium and discussed that the most concerning symptom would be vaginal bleeding which she declines at this time.  Should she note any bleeding in the future would encourage her to follow-up in the office.  Briefly discussed pros and cons and Prometrium  at this time we will hold off on treatment as she is asymptomatic  2) Urinary concerns -No evidence of prolapse on exam - Pap questionnaire completed score 11, which is consistent with interstitial cystitis - Reviewed most and least bothersome food and encourage patient to meet nutritional changes to see if she notes any improvement.  Patient reports that she already has is on a anti-inflammatory diet due to her mast cell syndrome []  Plan to evaluate in a few months encounter for sideration for cystoscopy or possible medical management  3) elevated BP F/u with PCP  Orders Placed This Encounter  Procedures   US  PELVIC COMPLETE WITH TRANSVAGINAL    Return for pelvic US  next available, and f/u in 3-4 mos with Avonna Iribe.   Roan Miklos, DO Attending Obstetrician & Gynecologist, [facility] [employer], [facility]

## 2024-02-24 ENCOUNTER — Inpatient Hospital Stay (HOSPITAL_BASED_OUTPATIENT_CLINIC_OR_DEPARTMENT_OTHER): Payer: Medicaid Other | Admitting: Oncology

## 2024-02-24 DIAGNOSIS — D582 Other hemoglobinopathies: Secondary | ICD-10-CM | POA: Diagnosis not present

## 2024-02-24 NOTE — Progress Notes (Signed)
 Virtual Visit via Telephone Note  I connected with Summer [name] on 02/27/24 at  1:30 PM EDT by telephone and verified that I am speaking with the correct person using two identifiers.  Location: Patient: Home Provider: Clinic    I discussed the limitations, risks, security and privacy concerns of performing an evaluation and management service by telephone and the availability of in person appointments. I also discussed with the patient that there may be a patient responsible charge related to this service. The patient expressed understanding and agreed to proceed.   History of Present Illness: Patient is a 63 year old female with past medical history significant for secondary erythrocytosis.  She was last seen in clinic on 08/19/2023 by me.  Since her last visit, she denies any hospitalizations, surgeries or changes to her baseline health.  Reports overall doing well since her last visit but has very little energy and appetite is 50%.    Has chronic joint pain in her back and is now having some lower abdominal pain.  Reports she has imaging scheduled in the next week or so for GB lesion.  Has a diarrhea, constipation and nausea intermittently.  Has chronic insomnia.  Reports she was diagnosed with mast cell activation disorder and is followed by an allergy specialist.  Reports she is trying to increase the amount of time she wears her CPAP.  Reports she is having some allergies to her mask as it is causing burning eyes and runny nose.  She is trying to work with the CPAP company to get an alternative such as nasal cannula.   Reports she has been unable to exercise due to joint pain.  Reports she has lost approximately 7 pounds due to a change in her diet to low histamine for mast cell syndrome.  She is also been referred to a nutritionist.  She is taking Pepcid  20 mg in the morning and 40 mg in the afternoon for known GERD which appears to be helping.  Continues to have some balance  concerns.  He uses a cane for ambulation. Reports occasional itching after hot showers and pruritus, toes get purple when exposed to cold and occasional "pins-and-needles" paresthesias in the fingers and toes.  She has intermittent dizziness and blurry vision.  No B symptoms.     Observations/Objective:Review of Systems  Constitutional:  Positive for malaise/fatigue.  Respiratory:  Positive for cough and shortness of breath.   Cardiovascular:  Positive for chest pain and palpitations.  Gastrointestinal:  Positive for constipation, diarrhea and nausea.  Neurological:  Positive for dizziness, tingling and sensory change.  Psychiatric/Behavioral:  The patient has insomnia.    Physical Exam Neurological:     Mental Status: She is alert and oriented to person, place, and time.     Assessment and Plan: 1. Elevated hemoglobin (HCC) (Primary) -Likely secondary to sleep apnea or hyper concentration from Lasix . No evidence of MPN or clonal erythrocytosis at this time. -Labs from 02/17/2024 show an unremarkable CMP and an improvement in her hemoglobin to 16.2 (17.1) with a hematocrit of 49.6 (50.8).  Otherwise, CBC unremarkable. -Treatment for secondary erythrocytosis is aimed at underlying cause which is her case would be sleep apnea. -Recommend trying to use CPAP more regularly and for longer periods of time to see if this makes any changes to her lab work. -Follow-up in 6 months with labs a few days before.  2. Mast Cell  -Followed by allergist- Dr. Jacquie Maudlin. -Patient is wondering if hematology plays a role  in managing mast cell disorder. -Will touch base with Dr. Cheree Cords to see if this is something we are able to help manage or if she is better served to stay with her allergist.  Follow Up Instructions: Return to clinic in 6 months with labs a few days before.   I discussed the assessment and treatment plan with the patient. The patient was provided an opportunity to ask questions and  all were answered. The patient agreed with the plan and demonstrated an understanding of the instructions.   The patient was advised to call back or seek an in-person evaluation if the symptoms worsen or if the condition fails to improve as anticipated.  I provided 20 minutes of non-face-to-face time during this encounter.   Aurther Blue, NP

## 2024-03-03 DIAGNOSIS — G4733 Obstructive sleep apnea (adult) (pediatric): Secondary | ICD-10-CM | POA: Diagnosis not present

## 2024-03-05 ENCOUNTER — Ambulatory Visit (INDEPENDENT_AMBULATORY_CARE_PROVIDER_SITE_OTHER): Payer: Medicaid Other | Admitting: Psychiatry

## 2024-03-05 DIAGNOSIS — F411 Generalized anxiety disorder: Secondary | ICD-10-CM
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
## 2024-03-06 ENCOUNTER — Ambulatory Visit (INDEPENDENT_AMBULATORY_CARE_PROVIDER_SITE_OTHER): Admitting: Radiology

## 2024-03-06 DIAGNOSIS — R102 Pelvic and perineal pain: Secondary | ICD-10-CM | POA: Diagnosis not present

## 2024-03-06 NOTE — Progress Notes (Signed)
 GYN US : TA and TV imaging performed - vinyl probe cover used - Chaperone: Keila Anteverted uterus normal in size, symmetrical myometrium, single intramural fibroid seen in mid posterior wall = 28 x 20 mm (fibroid abutts the posterior endometrium) Endom thickness = 3.4 mm, uniform avascular cavity and canal, no evidence of intracavitary defects. Best views of Uterus were from Transabdominal approach. Nl atrophic Left ovary only seen Transabdominally, not able to determine mobility Right ovary not seen from TA approach or TA approach  -  neg adnexal regions neg CDS, no free fluid present

## 2024-03-07 ENCOUNTER — Ambulatory Visit: Payer: Medicaid Other | Admitting: Allergy & Immunology

## 2024-03-09 ENCOUNTER — Other Ambulatory Visit

## 2024-03-12 ENCOUNTER — Telehealth: Payer: Self-pay | Admitting: Obstetrics & Gynecology

## 2024-03-12 NOTE — Telephone Encounter (Signed)
 Called patient with his also pelvic ultrasound - Overall everything looked normal endometrium normal no ovarian masses appreciated - No underlying etiology to explain pelvic pain - Questions and concerns were addressed patient to follow-up as needed  Cenia Zaragosa, DO Attending Obstetrician & Gynecologist, [facility] [employer], [facility]

## 2024-03-13 ENCOUNTER — Ambulatory Visit: Admitting: "Endocrinology

## 2024-03-14 ENCOUNTER — Other Ambulatory Visit: Payer: Self-pay

## 2024-03-14 ENCOUNTER — Encounter: Payer: Self-pay | Admitting: Allergy & Immunology

## 2024-03-14 ENCOUNTER — Ambulatory Visit: Admitting: Allergy & Immunology

## 2024-03-14 VITALS — BP 138/72 | HR 97 | Temp 98.8°F | Resp 19 | Ht 63.78 in | Wt 304.0 lb

## 2024-03-14 DIAGNOSIS — R0602 Shortness of breath: Secondary | ICD-10-CM | POA: Diagnosis not present

## 2024-03-14 DIAGNOSIS — F419 Anxiety disorder, unspecified: Secondary | ICD-10-CM

## 2024-03-14 DIAGNOSIS — L299 Pruritus, unspecified: Secondary | ICD-10-CM | POA: Diagnosis not present

## 2024-03-14 DIAGNOSIS — T7840XD Allergy, unspecified, subsequent encounter: Secondary | ICD-10-CM

## 2024-03-14 DIAGNOSIS — R109 Unspecified abdominal pain: Secondary | ICD-10-CM | POA: Diagnosis not present

## 2024-03-14 DIAGNOSIS — K219 Gastro-esophageal reflux disease without esophagitis: Secondary | ICD-10-CM | POA: Diagnosis not present

## 2024-03-14 DIAGNOSIS — J310 Chronic rhinitis: Secondary | ICD-10-CM

## 2024-03-14 DIAGNOSIS — J4540 Moderate persistent asthma, uncomplicated: Secondary | ICD-10-CM

## 2024-03-14 DIAGNOSIS — J3089 Other allergic rhinitis: Secondary | ICD-10-CM | POA: Diagnosis not present

## 2024-03-14 DIAGNOSIS — D8940 Mast cell activation, unspecified: Secondary | ICD-10-CM

## 2024-03-14 NOTE — Progress Notes (Signed)
 FOLLOW UP  Date of Service/Encounter:  03/14/24   Assessment:   Perennial allergic rhinitis (indoor molds)   Allergic reactions - unknown trigger (possible mast cell activation syndrome, although tryptase has never been elevated)   Chronic urticaria - improved with daily antihistamines and montelukast    Shortness of breath - with slowly improving spirometry tracings    Anxiety - has Xanas as needed, although is not clear that this is working   Fatigue - likely secondary to obstructive sleep apnea (has not been started on CPAP yet (followed by Summer [name])   Hypothyroidism - with elevated anti-TPO antibodies  Plan/Recommendations:   1. Perennial allergic rhinitis - indoor molds - Continue with: Pepcid  (famotidine ) 40mg  twice daily and the antihistamine regimen as discussed.  - You can use an extra dose of the antihistamine, if needed, for breakthrough symptoms.   2. Allergic reaction - likely mast cell activation syndrome (although labs have all been normal, checking more now) - Continue with MORNING: Zyrtec  (cetirizine ) 20mg  tablet in the morning and and Pepcid  (famotidine ) 40mg   - Continue with EVENING: Xyzal  (levocetirizine) 5mg  at night and Pepcid  (famotidine ) 40mg  - Continue with: cromolyn  5mL 2-4 times daily - Continue with: hydroxyzine  25mg  to 50mg  at night - Labs ordered, including urine tests.  - Go to the lab and do the blood work and the urine tests (I think you should be able to get these at the local [facility] - I confirmed with Summer [name] on that). - Refer to [facility] again.   3. Shortness of breath - Lung testing looks a bit lower today.  - Daily controller medication(s): Symbicort  80/4.24mcg two puffs one or two times daily with spacer and cromolyn  nebulizer twice daily as you are doing. - Prior to physical activity: albuterol  2 puffs 10-15 minutes before physical activity. - Rescue medications: albuterol  4 puffs every 4-6 hours as needed and albuterol  nebulizer  one vial every 4-6 hours as needed - Asthma control goals:  * Full participation in all desired activities (may need albuterol  before activity) * Albuterol  use two time or less a week on average (not counting use with activity) * Cough interfering with sleep two time or less a month * Oral steroids no more than once a year * No hospitalizations  4. Hashimoto's thyroiditis  - Continue to follow with your PCP.   5. Bilateral flank pain - We are going to get a renal ultrasound to looks at the kidneys.   6. Return in about 6 months (around 09/14/2024). You can have the follow up appointment with Summer [name] or a Nurse Practicioner (our Nurse Practitioners are excellent and always have Physician oversight!).   Subjective:   Summer [name] is a 63 y.o. female presenting today for follow up of  Chief Complaint  Patient presents with   Asthma    Summer [name] has a history of the following: Patient Active Problem List   Diagnosis Date Noted   Chronic sinusitis 10/18/2023   Obesity 10/18/2023   Diastolic CHF, chronic (HCC) 08/03/2023   Thickened endometrium 03/22/2023   Nonrheumatic mitral valve regurgitation 03/15/2023   OSA (obstructive sleep apnea) 03/07/2023   Moderate persistent asthma 03/07/2023   Gallbladder polyp 02/22/2023   Bloating symptom 02/22/2023   Nausea without vomiting 02/22/2023   Mast cell activation syndrome (HCC) 01/14/2023   Fatty liver 09/20/2022   Hemorrhoids 09/20/2022   Orange stool 08/05/2022   Carpal tunnel syndrome 05/18/2022   Spinal stenosis 05/18/2022   Lower abdominal  pain 04/28/2022   Constipation 04/06/2022   DOE (dyspnea on exertion) 01/18/2022   Emphysema lung (HCC) 01/15/2022   Hyperlipidemia 01/15/2022   Elevated hemoglobin (HCC) 01/15/2022   Morbid obesity (HCC) 05/28/2021   LUQ pain 12/17/2020   Rectal bleeding 10/28/2020   Postmenopausal bleeding 08/10/2018   Prediabetes 06/10/2014   Hypothyroidism 06/10/2014    Gastroesophageal reflux disease 04/01/2013   Fibromyalgia 03/22/2013    History obtained from: chart review and patient and her husband.  Discussed the use of AI scribe software for clinical note transcription with the patient and/or guardian, who gave verbal consent to proceed.  Summer [name] is a 63 y.o. female presenting for a follow up visit.  She was last seen in January 2025.  At that time, we continue with Pepcid  40 mg twice daily.  For her allergic reactions, we continue with Zyrtec  20 mg in the morning and Pepcid  40 mg at night.  We also continue with Xyzal  5 mg at night and Pepcid  40 mg in the evening.  She remains on cromolyn  5 mL 2-5 times daily and hydroxyzine  25 mg to 50 mg at night.  We continued with Symbicort  80 mcg 2 puffs 1-2 times daily and cromolyn  nebulizer twice daily.  We ordered some urine tests to look at muscle activation syndrome. We also ordered some food testing.  This has been collected.  She experiences ongoing allergic reactions, including a severe episode approximately a week and a half ago, characterized by burning in her stomach and weakness after eating familiar foods. Zyrtec  provided some relief within twenty minutes. Her current medication regimen includes Zyrtec  and Pepcid  in the morning, and Xyzal  and Pepcid  in the evening. She has been unable to afford cromolyn  recently. She follows a low histamine diet and avoids tomatoes, chocolate, dairy, and gluten due to various conditions including Hashimoto's and reflux.  She experiences severe abdominal pain in both sides and lower back. Initially diagnosed with a UTI and treated with antibiotics, the pain persisted. Another provider did not consider it a GI issue and advised her to see her allergist. Her OB-GYN suggested interstitial cystitis and discussed possible dietary changes as part of the management. Apparently all of her specialists are blaming her mast cell disease for her symptoms, no matter the organ system. Again  - the diagnosis is still in flux, but she has responded, albeit partially only, to treatment.   Asthma/Respiratory Symptom History: She has breathing issues managed with Symbicort  and occasional albuterol  use, primarily before exertion. She reports improvement in her breathing, with less frequent need for albuterol .  Her breathing is under pretty good control with current regimen. She smokes a pack of cigarettes a day, which may impact her lung function.   Allergic Rhinitis Symptom History: She remains on the Zyrtec  2 tablets in the morning and Xyzal  5 mg at night.  She also is on the cromolyn  5 mL 2-4 times daily as well as hydroxyzine  25 mg to 50 mg at night.  Food Allergy Symptom History: She has a lot of active labs in the system.  Apparently she has an outstanding bill with [facility] and they refused to take her blood.  This is why she has not gotten any of the testing done that was ordered at the last visit.  GERD Symptom History: Despite being on Pepcid , she experiences reflux exacerbated by stress and lying down after meals. She is scheduled for a colonoscopy and endoscopy in January due to a history of precancerous polyps and a hiatal  hernia.  She has a history of Hashimoto's thyroiditis, with fluctuating thyroid  levels. She is awaiting an endocrinology appointment due to previous elevated T3 levels.  She experiences muscle spasms in her sides, potentially related to electrolyte imbalances. She manages this with magnesium supplements and electrolyte drinks like Propel  Otherwise, there have been no changes to her past medical history, surgical history, family history, or social history.    Review of systems otherwise negative other than that mentioned in the HPI.    Objective:   Blood pressure 138/72, pulse 97, temperature 98.8 F (37.1 C), temperature source Temporal, resp. rate 19, height 5' 3.78" (1.62 m), weight (!) 304 lb (137.9 kg), last menstrual period 01/23/2014, SpO2  97%. Body mass index is 52.54 kg/m.    Physical Exam Vitals reviewed.  Constitutional:      Appearance: She is well-developed. She is obese.     Comments: Seems maybe more chill today.  Pan positive review of systems.  HENT:     Head: Normocephalic and atraumatic.     Right Ear: Tympanic membrane, ear canal and external ear normal. No drainage, swelling or tenderness. Tympanic membrane is not injected, scarred, erythematous, retracted or bulging.     Left Ear: Tympanic membrane, ear canal and external ear normal. No drainage, swelling or tenderness. Tympanic membrane is not injected, scarred, erythematous, retracted or bulging.     Nose: Mucosal edema and rhinorrhea present. No nasal deformity or septal deviation.     Right Nostril: No epistaxis.     Left Nostril: No epistaxis.     Right Turbinates: Enlarged, swollen and pale.     Left Turbinates: Enlarged, swollen and pale.     Right Sinus: No maxillary sinus tenderness or frontal sinus tenderness.     Left Sinus: No maxillary sinus tenderness or frontal sinus tenderness.     Comments: No polyps noted.     Mouth/Throat:     Lips: Pink.     Mouth: Mucous membranes are moist. Mucous membranes are not pale and not dry.     Pharynx: Uvula midline.     Comments: Mild cobblestoning.  Eyes:     General: Allergic shiner present.        Right eye: No discharge.        Left eye: No discharge.     Conjunctiva/sclera: Conjunctivae normal.     Right eye: Right conjunctiva is not injected. No chemosis.    Left eye: Left conjunctiva is not injected. No chemosis.    Pupils: Pupils are equal, round, and reactive to light.  Cardiovascular:     Rate and Rhythm: Normal rate and regular rhythm.     Heart sounds: Normal heart sounds.  Pulmonary:     Effort: Pulmonary effort is normal. No tachypnea, accessory muscle usage or respiratory distress.     Breath sounds: Normal breath sounds. Decreased air movement present. No wheezing, rhonchi or  rales.     Comments: Decreased air movement at the bases.  No wheezing or crackles.  Speaking in full sentences. Chest:     Chest wall: No tenderness.  Abdominal:     Tenderness: There is no abdominal tenderness. There is no guarding or rebound.  Lymphadenopathy:     Head:     Right side of head: No submandibular, tonsillar or occipital adenopathy.     Left side of head: No submandibular, tonsillar or occipital adenopathy.     Cervical: No cervical adenopathy.  Skin:    General: Skin is  warm.     Capillary Refill: Capillary refill takes less than 2 seconds.     Coloration: Skin is not pale.     Findings: No abrasion, erythema, petechiae or rash. Rash is not papular, urticarial or vesicular.     Comments: No flushing noted.  Neurological:     Mental Status: She is alert.  Psychiatric:        Behavior: Behavior is cooperative.      Diagnostic studies:    Spirometry: results normal (FEV1: 1.84/77%, FVC: 2.21/73%, FEV1/FVC: 83%).    Spirometry consistent with normal pattern.    Allergy Studies: none      Drexel Gentles, MD  [facility] 

## 2024-03-14 NOTE — Patient Instructions (Addendum)
 1. Perennial allergic rhinitis - indoor molds - Continue with: Pepcid  (famotidine ) 40mg  twice daily and the antihistamine regimen as discussed.  - You can use an extra dose of the antihistamine, if needed, for breakthrough symptoms.   2. Allergic reaction - likely mast cell activation syndrome (although labs have all been normal, checking more now) - Continue with MORNING: Zyrtec  (cetirizine ) 20mg  tablet in the morning and and Pepcid  (famotidine ) 40mg   - Continue with EVENING: Xyzal  (levocetirizine) 5mg  at night and Pepcid  (famotidine ) 40mg  - Continue with: cromolyn  5mL 2-4 times daily - Continue with: hydroxyzine  25mg  to 50mg  at night - Labs ordered, including urine tests.  - Go to the lab and do the blood work and the urine tests (I think you should be able to get these at the local [facility] - I confirmed with Rachelle on that). - Refer to [facility] again.   3. Shortness of breath - Lung testing looks a bit lower today.  - Daily controller medication(s): Symbicort  80/4.44mcg two puffs one or two times daily with spacer and cromolyn  nebulizer twice daily as you are doing. - Prior to physical activity: albuterol  2 puffs 10-15 minutes before physical activity. - Rescue medications: albuterol  4 puffs every 4-6 hours as needed and albuterol  nebulizer one vial every 4-6 hours as needed - Asthma control goals:  * Full participation in all desired activities (may need albuterol  before activity) * Albuterol  use two time or less a week on average (not counting use with activity) * Cough interfering with sleep two time or less a month * Oral steroids no more than once a year * No hospitalizations  4. Hashimoto's thyroiditis  - Continue to follow with your PCP.   5. Bilateral flank pain - We are going to get a renal ultrasound to looks at the kidneys.   6. Return in about 6 months (around 09/14/2024). You can have the follow up appointment with Dr. Idolina Maker or a Nurse Practicioner (our Nurse  Practitioners are excellent and always have Physician oversight!).    Please inform us  of any Emergency Department visits, hospitalizations, or changes in symptoms. Call us  before going to the ED for breathing or allergy symptoms since we might be able to fit you in for a sick visit. Feel free to contact us  anytime with any questions, problems, or concerns.  It was a pleasure to see you again today!  Websites that have reliable patient information: 1. American Academy of Asthma, Allergy, and Immunology: www.aaaai.org 2. Food Allergy Research and Education (FARE): foodallergy.org 3. Mothers of Asthmatics: http://www.asthmacommunitynetwork.org 4. American College of Allergy, Asthma, and Immunology: www.acaai.org      "Like" us  on Facebook and Instagram for our latest updates!      A healthy democracy works best when [employer] participate! Make sure you are registered to vote! If you have moved or changed any of your contact information, you will need to get this updated before voting! Scan the QR codes below to learn more!

## 2024-03-19 ENCOUNTER — Ambulatory Visit (INDEPENDENT_AMBULATORY_CARE_PROVIDER_SITE_OTHER): Payer: Medicaid Other | Admitting: Psychiatry

## 2024-03-19 DIAGNOSIS — F411 Generalized anxiety disorder: Secondary | ICD-10-CM | POA: Diagnosis not present
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
## 2024-03-20 ENCOUNTER — Encounter: Payer: Self-pay | Admitting: Allergy & Immunology

## 2024-03-23 ENCOUNTER — Ambulatory Visit (HOSPITAL_COMMUNITY)

## 2024-03-28 ENCOUNTER — Ambulatory Visit (INDEPENDENT_AMBULATORY_CARE_PROVIDER_SITE_OTHER): Payer: Self-pay | Admitting: Gastroenterology

## 2024-03-28 ENCOUNTER — Ambulatory Visit (HOSPITAL_COMMUNITY)
Admission: RE | Admit: 2024-03-28 | Discharge: 2024-03-28 | Disposition: A | Discharge status: Home / Self Care | Source: Ambulatory Visit | Attending: Gastroenterology | Admitting: Gastroenterology

## 2024-03-28 DIAGNOSIS — K824 Cholesterolosis of gallbladder: Secondary | ICD-10-CM | POA: Insufficient documentation

## 2024-04-03 DIAGNOSIS — K036 Deposits [accretions] on teeth: Secondary | ICD-10-CM | POA: Diagnosis not present

## 2024-04-03 DIAGNOSIS — G4733 Obstructive sleep apnea (adult) (pediatric): Secondary | ICD-10-CM | POA: Diagnosis not present

## 2024-04-12 ENCOUNTER — Ambulatory Visit (INDEPENDENT_AMBULATORY_CARE_PROVIDER_SITE_OTHER): Admitting: Psychiatry

## 2024-04-12 DIAGNOSIS — F411 Generalized anxiety disorder: Secondary | ICD-10-CM
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
## 2024-04-16 ENCOUNTER — Encounter: Payer: Self-pay | Admitting: Family Medicine

## 2024-04-16 ENCOUNTER — Ambulatory Visit: Admitting: Family Medicine

## 2024-04-16 VITALS — BP 134/84 | Temp 98.2°F | Ht 63.78 in | Wt 304.0 lb

## 2024-04-16 DIAGNOSIS — E039 Hypothyroidism, unspecified: Secondary | ICD-10-CM

## 2024-04-16 DIAGNOSIS — R002 Palpitations: Secondary | ICD-10-CM

## 2024-04-16 DIAGNOSIS — R739 Hyperglycemia, unspecified: Secondary | ICD-10-CM

## 2024-04-16 DIAGNOSIS — E785 Hyperlipidemia, unspecified: Secondary | ICD-10-CM | POA: Diagnosis not present

## 2024-04-16 DIAGNOSIS — F411 Generalized anxiety disorder: Secondary | ICD-10-CM

## 2024-04-16 DIAGNOSIS — I5032 Chronic diastolic (congestive) heart failure: Secondary | ICD-10-CM | POA: Diagnosis not present

## 2024-04-16 MED ORDER — ALPRAZOLAM 0.5 MG PO TABS: 0.5000 mg | ORAL_TABLET | Freq: Two times a day (BID) | ORAL | 0 refills | Status: AC | PRN

## 2024-04-16 NOTE — Progress Notes (Signed)
 Subjective:    Patient ID: Summer [name], female    DOB: 1961/05/11, 63 y.o.   MRN: [id number]  HPI 3 month f/u  Diastolic CHF, chronic   Thyroiditis   Morbid obesity  Discussed the use of AI scribe software for clinical note transcription with the patient, who gave verbal consent to proceed.  History of Present Illness   Summer [name] is a 63 year old female with palpitations and chest pain who presents with ongoing chest pain and heart palpitations.  She experiences persistent chest pain and heart palpitations, describing the palpitations as her heart 'fluttering' and 'randomly racing,' even at rest. These symptoms have been ongoing since she had COVID-19. Recently, she had a severe episode where her heart felt like it was 'going like a drum,' which was alleviated by coughing.  She describes an episode of tingling in her right arm, from her fingers up past her elbow, lasting over an hour, accompanied by weakness and feeling 'washed out.' This episode resolved after a couple of hours.  She has a history of seeing a cardiologist who suggested a stress test, but it was not performed due to concerns about her mast cell condition. She was informed that her heart was not relaxing properly and a minor valve leak was found on echocardiogram. Previous monitoring indicated rhythm issues, but details were not provided to her.  She is experiencing significant stress due to family issues, including her nephew's recent diagnosis of T-cell lymphoma and financial difficulties, impacting her mental health. She feels constantly anxious, with symptoms such as a 'constantly going' leg, feeling like she's 'climbing out of her skin,' and difficulty sleeping. She is currently taking Xanax  as needed for anxiety.  She mentions financial struggles, including pending SSI approval and medical debt from 2011. Her daughter assists her with chores and daily activities due to her health issues.         Review  of Systems     Objective:   Physical Exam General-in no acute distress Eyes-no discharge Lungs-respiratory rate normal, CTA CV-no murmurs,RRR Extremities skin warm dry no edema Neuro grossly normal Behavior normal, alert        Assessment & Plan:  1. Palpitation (Primary) Moderate palpitation issues Will order telemetry I do not feel this is a sign of any coronary artery disease   2. Hypothyroidism, unspecified type She is scheduled to see endocrinology later this summer Patient very concerned about thyroiditis and how it may be affecting her health  3. GAD (generalized anxiety disorder) May use Xanax  sparingly I encouraged her to consider a daily serotonin reuptake inhibitor patient defers on this currently she will let us  know  4. Diastolic CHF, chronic (HCC) Stable currently I do not feel she needs to have a coronary CT.  Her previous one look normal.  Hard to know what is causing her intermittent chest pains but I doubt that it is cardiac  5. Hyperlipidemia, unspecified hyperlipidemia type Check cholesterol panel - Lipid panel  6. Hyperglycemia Check A1c - Hemoglobin A1c - Basic metabolic panel with GFR  Follow-up within 5 to 6 months  Assessment and Plan    Palpitations Intermittent palpitations with a history of minor valve leak and rhythm issues. Severe episode with transient right arm symptoms. Awaiting endocrinology evaluation. - Order 7-day telemetry monitor to assess heart rhythms. - Encourage submission of previous monitoring results for review.  Chest Pain Daily chest pressure with no significant coronary artery disease on prior CT. Stress test deferred  due to mast cell disorder concerns. - Consider further cardiac evaluation if symptoms persist or worsen.  Anxiety High stress with restlessness, insomnia, and acute anxiety episodes. Uses alprazolam , hesitant to start Zoloft. - Refill alprazolam  for acute anxiety. - Encourage consideration of  Zoloft for long-term management.  General Health Maintenance Delayed lab work due to financial constraints despite Medicaid coverage. - Order basic blood work: A1c, cholesterol, kidney function. - Encourage lab completion when financially feasible.  Follow-up Endocrinology appointment scheduled; advised primary care follow-up in fall or sooner if needed. - Follow up with primary care in fall or sooner if symptoms worsen. - Attend endocrinology appointment in July.

## 2024-05-01 ENCOUNTER — Ambulatory Visit (HOSPITAL_COMMUNITY): Admitting: Psychiatry

## 2024-05-09 ENCOUNTER — Ambulatory Visit: Admitting: "Endocrinology

## 2024-05-10 ENCOUNTER — Ambulatory Visit: Admitting: Family Medicine

## 2024-05-13 ENCOUNTER — Telehealth: Payer: Self-pay | Admitting: Family Medicine

## 2024-05-13 ENCOUNTER — Encounter: Payer: Self-pay | Admitting: Family Medicine

## 2024-05-13 NOTE — Progress Notes (Signed)
 Please print, let me sign, family can pick this up

## 2024-05-13 NOTE — Progress Notes (Signed)
 Please print, I will sign, then please give to family

## 2024-05-13 NOTE — Telephone Encounter (Signed)
 Patient has palpitations and an irregular heartbeat.  Please make sure we order a 7-day telemetry to be mailed to her for these reasons Send patient a MyChart message letting her know this is being completed thank you

## 2024-05-14 ENCOUNTER — Ambulatory Visit: Attending: Family Medicine

## 2024-05-14 ENCOUNTER — Other Ambulatory Visit: Payer: Self-pay

## 2024-05-14 ENCOUNTER — Ambulatory Visit

## 2024-05-14 DIAGNOSIS — R002 Palpitations: Secondary | ICD-10-CM

## 2024-05-14 NOTE — Progress Notes (Unsigned)
 EP to read.

## 2024-05-17 ENCOUNTER — Ambulatory Visit (HOSPITAL_COMMUNITY): Admitting: Psychiatry

## 2024-05-23 ENCOUNTER — Encounter: Payer: Self-pay | Admitting: Family Medicine

## 2024-05-23 NOTE — Telephone Encounter (Signed)
 Nurses Please assist Summer [name] with getting these letters. These were dictated on July 13 not quite sure why they were never printed and sent to the patient  Please have the front (letters and call the patient let her know that they can pick these up or if she wants to the front can mail them to her  If she needs anything additional please let me know thanks-Dr. Glendia Lanius may share a copy of this message with her as well

## 2024-05-24 ENCOUNTER — Ambulatory Visit: Payer: Self-pay

## 2024-05-24 NOTE — Telephone Encounter (Signed)
 Copied from CRM [id number]. Topic: Clinical - Medical Advice >> May 24, 2024 10:13 AM Cleave MATSU wrote: Reason for CRM: pt said the heart monitor that dr alphonsa ordered for her wasn't working properly . And she have blisters from the tape please follow up with pt.

## 2024-05-24 NOTE — Telephone Encounter (Signed)
 3 attempts made to reach patient, voicemail left with clinic call back number.

## 2024-05-24 NOTE — Telephone Encounter (Signed)
 Did speak with the patient and she called [facility] regarding the heart monitor and they informed her to go ahead and take the monitor off and mail them both back in, because she received 2 of them by mistake. She did have blisters on the skin that were in contact with the adhesive from the monitor, she does have a cardiologist, but would like to know what pcp would like to do about the situation, I did inform her he is out of the office until Monday next week and to call cardiology to see what other options are available as far as adhesive alternatives.

## 2024-05-25 ENCOUNTER — Telehealth: Payer: Self-pay

## 2024-05-25 NOTE — Telephone Encounter (Signed)
 I do not have additional recommendations regarding blistering from the tape but if she is having blistering from the tank stop using the monitor and send it back

## 2024-05-25 NOTE — Telephone Encounter (Signed)
 Called pt back regarding heart monitor. Relayed instructions given to mail back due to tape causing blisters given by Dr Glendia. Patient states that she was able to wear the monitor for 4 and 1/2 days. Is concerned if that was long enough to get results from. Explained that if we get results that Dr Glendia will review and we will reach out to patient to let her know. Patient voiced understanding.

## 2024-05-29 ENCOUNTER — Ambulatory Visit: Admitting: "Endocrinology

## 2024-05-31 ENCOUNTER — Ambulatory Visit (HOSPITAL_COMMUNITY): Admitting: Psychiatry

## 2024-06-09 DIAGNOSIS — R002 Palpitations: Secondary | ICD-10-CM

## 2024-06-11 ENCOUNTER — Ambulatory Visit: Payer: Self-pay | Admitting: Family Medicine

## 2024-06-14 ENCOUNTER — Ambulatory Visit (HOSPITAL_COMMUNITY): Admitting: Psychiatry

## 2024-06-15 NOTE — Telephone Encounter (Signed)
 Nurses As per standard protocol her message was transferred over to her husband's chart Please see his telephone message to the nursing pool for the referral Please proceed forward with referral Thanks-Dr. Glendia Lanius may notify Rosaline that this has been initiated plus also see telephone message for further details regarding all of that

## 2024-06-28 ENCOUNTER — Ambulatory Visit (HOSPITAL_COMMUNITY): Admitting: Psychiatry

## 2024-07-03 DIAGNOSIS — G4733 Obstructive sleep apnea (adult) (pediatric): Secondary | ICD-10-CM | POA: Diagnosis not present

## 2024-07-10 ENCOUNTER — Other Ambulatory Visit: Payer: Self-pay | Admitting: Allergy & Immunology

## 2024-07-11 ENCOUNTER — Ambulatory Visit: Admitting: "Endocrinology

## 2024-07-11 ENCOUNTER — Encounter: Payer: Self-pay | Admitting: "Endocrinology

## 2024-07-11 VITALS — BP 100/80 | HR 80 | Ht 63.0 in | Wt 316.0 lb

## 2024-07-11 DIAGNOSIS — E063 Autoimmune thyroiditis: Secondary | ICD-10-CM

## 2024-07-11 NOTE — Progress Notes (Signed)
 Outpatient Endocrinology Note Obadiah Birmingham, MD  07/11/24   Summer [name] Jul 20, 1961 [id number]  Referring Provider: Alphonsa Glendia LABOR, MD Primary Care Provider: Alphonsa Glendia LABOR, MD Subjective  No chief complaint on file.   Assessment & Plan  Diagnoses and all orders for this visit:  Hashimoto's disease -     Cancel: TSH + free T4 -     T3, free -     T4, free; Future -     TSH; Future   Summer [name] is currently taking NP thyroid  60 mg qd. Was taking alternate days to save money and improve T3 levels.   Patient was biochemically euthyroid but has elevated T3.  Educated on thyroid  axis.  Recommend the following: Take NP thyroid  60 mg once every morning.  Advised to take levothyroxine first thing in the morning on empty stomach and wait at least 30 minutes to 1 hour before eating or drinking anything or taking any other medications. Space out levothyroxine by 4 hours from any acid reflux medication/fibrate/iron/calcium /multivitamin. Advised to take birth control pills and nutritional supplements in the evening. Repeat lab before next visit or sooner if symptoms of hyperthyroidism or hypothyroidism develop.  Notify us  immediately in case of pregnancy/breastfeeding or significant weight gain or loss. Counseled on compliance and follow up needs.  I have reviewed current medications, nurse's notes, allergies, vital signs, past medical and surgical history, family medical history, and social history for this encounter. Counseled patient on symptoms, examination findings, lab findings, imaging results, treatment decisions and monitoring and prognosis. The patient understood the recommendations and agrees with the treatment plan. All questions regarding treatment plan were fully answered.   Return in about 7 weeks (around 08/29/2024) for visit + labs before next visit.   Obadiah Birmingham, MD  07/11/24   I have reviewed current medications, nurse's notes, allergies, vital  signs, past medical and surgical history, family medical history, and social history for this encounter. Counseled patient on symptoms, examination findings, lab findings, imaging results, treatment decisions and monitoring and prognosis. The patient understood the recommendations and agrees with the treatment plan. All questions regarding treatment plan were fully answered.   History of Present Illness Summer [name] is a 63 y.o. year old female who presents to our clinic with hashimoto's thyroiditis diagnosed around 2010.    Symptoms suggestive of HYPOTHYROIDISM:  fatigue Yes weight gain Yes cold intolerance  No constipation  No  Symptoms suggestive of HYPERTHYROIDISM:  weight loss  No heat intolerance No hyperdefecation  No palpitations  Yes, sees a cardiologist. Has anxiety    Compressive symptoms:  dysphagia  No dysphonia  No positional dyspnea (especially with simultaneous arms elevation)  No  Smokes  No On biotin  No Personal history of head/neck surgery/irradiation  No  Physical Exam  BP 100/80   Pulse 80   Ht 5' 3 (1.6 m)   Wt (!) 316 lb (143.3 kg)   LMP 01/23/2014 Comment: spotting  SpO2 96%   BMI 55.98 kg/m  Constitutional: well developed, well nourished Head: normocephalic, atraumatic, no exophthalmos Eyes: sclera anicteric, no redness Neck: no thyromegaly, no thyroid  tenderness; no nodules palpated Lungs: normal respiratory effort Neurology: alert and oriented, no fine hand tremor Skin: dry, no appreciable rashes Musculoskeletal: no appreciable defects Psychiatric: normal mood and affect  Allergies Allergies  Allergen Reactions   Bee Venom Anaphylaxis and Hives   Molds & Smuts Shortness Of Breath   Shellfish Allergy Anaphylaxis and Hives  Avelox [Moxifloxacin Hcl In Nacl] Other (See Comments)    heart beats weird   Moxifloxacin Other (See Comments)    heart beats weird   Ciprofloxacin Other (See Comments)    heart beats weird   Biaxin   [Clarithromycin ]     Nausea and heart palpitations   Tape Rash    Current Medications Patient's Medications  New Prescriptions   No medications on file  Previous Medications   ALBUTEROL  (PROVENTIL ) (2.5 MG/3ML) 0.083% NEBULIZER SOLUTION    USE 1 VIAL IN NEBULIZER EVERY 6 HOURS AS NEEDED FOR WHEEZING OR SHORTNESS OF BREATH   ALPRAZOLAM  (XANAX ) 0.5 MG TABLET    Take 1 tablet (0.5 mg total) by mouth 2 (two) times daily as needed. for anxiety   ASCORBIC ACID (VITAMIN C) 500 MG CAPS    Take 500 mg by mouth daily.   B COMPLEX-C (SUPER B COMPLEX PO)    Take 1 tablet by mouth daily at 6 (six) AM.   BUDESONIDE -FORMOTEROL  (SYMBICORT ) 80-4.5 MCG/ACT INHALER    Inhale 2 puffs twice a day with spacer to help prevent cough and wheeze   CETIRIZINE  (ZYRTEC ) 10 MG TABLET    Take 2 tablets (20 mg total) by mouth 2 (two) times daily.   CHOLECALCIFEROL (VITAMIN D-3) 5000 UNITS TABS    Take 5,000 Units by mouth daily.   CROMOLYN  (GASTROCROM ) 100 MG/5ML SOLUTION    TAKE 5 ML BY MOUTH  4 TIMES DAILY( BEFORE MEAL(S) AND AT BEDTIME).   CROMOLYN  (INTAL ) 20 MG/2ML NEBULIZER SOLUTION    Take 2 mLs (20 mg total) by nebulization in the morning, at noon, in the evening, and at bedtime.   CROMOLYN  (OPTICROM ) 4 % OPHTHALMIC SOLUTION    2 drops 2 (two) times daily.   DEXLANSOPRAZOLE  (DEXILANT ) 60 MG CAPSULE    Take 1 capsule (60 mg total) by mouth daily.   EPINEPHRINE  0.3 MG/0.3 ML IJ SOAJ INJECTION    INJECT CONTENTS OF 1 PEN AS NEEDED FOR ANAPHYLAXIS   FAMOTIDINE  (PEPCID ) 40 MG TABLET    Take 1 tablet (40 mg total) by mouth 2 (two) times daily.   FLUTICASONE  (FLONASE ) 50 MCG/ACT NASAL SPRAY    Place 2 sprays into both nostrils daily.   FUROSEMIDE  (LASIX ) 20 MG TABLET    1  qam prn edema   HYDROXYZINE  (ATARAX ) 25 MG TABLET    Take 1 tablet (25 mg total) by mouth at bedtime.   IBUPROFEN  (ADVIL ) 600 MG TABLET    Take 1 tablet (600 mg total) by mouth every 8 (eight) hours as needed.   LEVOCETIRIZINE (XYZAL ) 5 MG TABLET     Take one to two tablets at night.   MAGNESIUM 200 MG TABS    Take 200 mg by mouth 2 (two) times daily.   NALTREXONE  HCL, PAIN, 4.5 MG CAPS    Take contents of half a capsule nightly for two weeks and then increase to one capsule nightly thereafter.   OMEGA-3 FATTY ACIDS (FISH OIL) 1200 MG CAPS    Take 1,200 mg by mouth daily.   POTASSIUM CHLORIDE  SA (KLOR-CON  M) 20 MEQ TABLET    1 every day prn when taking lasix    PREGABALIN  (LYRICA ) 25 MG CAPSULE    Take 1 capsule (25 mg total) by mouth 2 (two) times daily.   PROBIOTIC PRODUCT (PROBIOTIC PO)    Take 100 mg by mouth daily.   RESTASIS 0.05 % OPHTHALMIC EMULSION    Place 1 drop into both eyes daily.  ROSUVASTATIN  (CRESTOR ) 5 MG TABLET    Take 1 tablet (5 mg total) by mouth daily.   THYROID  (NP THYROID ) 60 MG TABLET    TAKE 1 TABLET BY MOUTH ONCE DAILY BEFORE BREAKFAST   VENTOLIN  HFA 108 (90 BASE) MCG/ACT INHALER    INHALE 2 PUFFS BY MOUTH EVERY 6 HOURS AS NEEDED FOR WHEEZING AND FOR SHORTNESS OF BREATH  Modified Medications   No medications on file  Discontinued Medications   No medications on file    Past Medical History Past Medical History:  Diagnosis Date   Arthritis    Bell's palsy    Chest pain, unspecified    Dysrhythmia, cardiac    Fibromyalgia    Hashimoto's disease    History of CT scan 06/2017   coronary CT scan on 06/01/2017, this did not find any cardiac calcifications. Coronary calcium  score of zero.   History of Holter monitoring    Hypertension    Mast cell activation syndrome (HCC)    Sleep apnea    Thyroid  disease    Urticaria     Past Surgical History Past Surgical History:  Procedure Laterality Date   BIOPSY  11/24/2021   Procedure: BIOPSY;  Surgeon: Eartha Angelia Sieving, MD;  Location: [facility];  Service: Gastroenterology;;   COLONOSCOPY N/A 01/23/2014   Procedure: COLONOSCOPY;  Surgeon: Claudis RAYMOND Rivet, MD;  Location: [facility];  Service: Endoscopy;  Laterality: N/A;  200   COLONOSCOPY  WITH PROPOFOL  N/A 11/24/2021   Procedure: COLONOSCOPY WITH PROPOFOL ;  Surgeon: Eartha Angelia Sieving, MD;  Location: [facility];  Service: Gastroenterology;  Laterality: N/A;  205   ESOPHAGOGASTRODUODENOSCOPY (EGD) WITH PROPOFOL  N/A 11/24/2021   Procedure: ESOPHAGOGASTRODUODENOSCOPY (EGD) WITH PROPOFOL ;  Surgeon: Eartha Angelia Sieving, MD;  Location: [facility];  Service: Gastroenterology;  Laterality: N/A;   HYSTEROSCOPY WITH D & C N/A 03/22/2023   Procedure: DILATATION AND CURETTAGE /HYSTEROSCOPY WITH MYOSURE;  Surgeon: Ozan, Jennifer, DO;  Location: [facility];  Service: Gynecology;  Laterality: N/A;   POLYPECTOMY  11/24/2021   Procedure: POLYPECTOMY;  Surgeon: Eartha Angelia Sieving, MD;  Location: [facility];  Service: Gastroenterology;;   TONSILLECTOMY      Family History family history includes Anxiety disorder in her brother, daughter, mother, and sister; Cancer in her mother; Other in her daughter and father. She was adopted.  Social History Social History   Socioeconomic History   Marital status: Married    Spouse name: Not on file   Number of children: Not on file   Years of education: Not on file   Highest education level: 12th grade  Occupational History   Not on file  Tobacco Use   Smoking status: Former    Current packs/day: 0.00    Average packs/day: 1 pack/day for 20.0 years (20.0 ttl pk-yrs)    Types: Cigarettes    Start date: 11/08/1983    Quit date: 11/08/2003    Years since quitting: 20.6    Passive exposure: Current   Smokeless tobacco: Never   Tobacco comments:    quit smoking 10 yrs   Vaping Use   Vaping status: Never Used  Substance and Sexual Activity   Alcohol use: No   Drug use: No   Sexual activity: Not Currently    Birth control/protection: Post-menopausal  Other Topics Concern   Not on file  Social History Narrative   Not on file   Social Drivers of Health   Financial Resource Strain: High Risk (04/15/2024)  Overall Financial  Resource Strain (CARDIA)    Difficulty of Paying Living Expenses: Very hard  Food Insecurity: Food Insecurity Present (04/15/2024)   Hunger Vital Sign    Worried About Running Out of Food in the Last Year: Often true    Ran Out of Food in the Last Year: Often true  Transportation Needs: No Transportation Needs (04/15/2024)   PRAPARE - Administrator, Civil Service (Medical): No    Lack of Transportation (Non-Medical): No  Physical Activity: Inactive (04/15/2024)   Exercise Vital Sign    Days of Exercise per Week: 0 days    Minutes of Exercise per Session: Not on file  Stress: Stress Concern Present (04/15/2024)   [employer] of Occupational Health - Occupational Stress Questionnaire    Feeling of Stress: Very much  Social Connections: Moderately Isolated (04/15/2024)   Social Connection and Isolation Panel    Frequency of Communication with Friends and Family: More than three times a week    Frequency of Social Gatherings with Friends and Family: Never    Attends Religious Services: Never    Database administrator or Organizations: No    Attends Banker Meetings: Not on file    Marital Status: Married  Intimate Partner Violence: Not on file    Laboratory Investigations Lab Results  Component Value Date   TSH 3.540 02/02/2024   TSH 2.612 12/14/2023   TSH 2.920 04/22/2023   FREET4 1.24 02/02/2024   FREET4 1.03 12/14/2023   FREET4 1.10 04/22/2023     No results found for: TSI   No components found for: TRAB   Lab Results  Component Value Date   CHOL 175 04/22/2023   Lab Results  Component Value Date   HDL 49 04/22/2023   Lab Results  Component Value Date   LDLCALC 94 04/22/2023   Lab Results  Component Value Date   TRIG 188 (H) 04/22/2023   Lab Results  Component Value Date   CHOLHDL 3.6 04/22/2023   Lab Results  Component Value Date   CREATININE 0.81 02/17/2024   No results found for: GFR    Component Value Date/Time   NA  138 02/17/2024 0940   NA 139 02/02/2024 1643   K 4.1 02/17/2024 0940   CL 106 02/17/2024 0940   CO2 26 02/17/2024 0940   GLUCOSE 174 (H) 02/17/2024 0940   BUN 20 02/17/2024 0940   BUN 15 02/02/2024 1643   CREATININE 0.81 02/17/2024 0940   CREATININE 0.60 10/07/2016 1537   CALCIUM  9.3 02/17/2024 0940   PROT 6.6 02/17/2024 0940   PROT 6.6 02/02/2024 1643   ALBUMIN  3.6 02/17/2024 0940   ALBUMIN  4.4 02/02/2024 1643   AST 20 02/17/2024 0940   ALT 21 02/17/2024 0940   ALKPHOS 95 02/17/2024 0940   BILITOT 0.7 02/17/2024 0940   BILITOT 0.6 02/02/2024 1643   GFRNONAA >60 02/17/2024 0940   GFRAA 102 10/30/2020 1521      Latest Ref Rng & Units 02/17/2024    9:40 AM 02/02/2024    4:43 PM 08/03/2023   12:05 PM  BMP  Glucose 70 - 99 mg/dL 825  888  880   BUN 6 - 20 mg/dL 20  15  11    Creatinine 0.44 - 1.00 mg/dL 9.18  9.20  9.19   BUN/Creat Ratio 12 - 28  19  14    Sodium 135 - 145 mmol/L 138  139  142   Potassium 3.5 - 5.1 mmol/L 4.1  4.2  3.9   Chloride 98 - 111 mmol/L 106  103  103   CO2 22 - 32 mmol/L 26  19  21    Calcium  8.9 - 10.3 mg/dL 9.3  9.4  9.4        Component Value Date/Time   WBC 8.1 02/17/2024 0940   RBC 5.37 (H) 02/17/2024 0940   HGB 16.2 (H) 02/17/2024 0940   HGB 17.1 (H) 02/02/2024 1643   HGB 17.1 (H) 02/02/2024 1643   HCT 49.6 (H) 02/17/2024 0940   HCT 50.8 (H) 02/02/2024 1643   HCT 49.7 (H) 02/02/2024 1643   PLT 247 02/17/2024 0940   PLT 275 02/02/2024 1643   PLT 287 02/02/2024 1643   MCV 92.4 02/17/2024 0940   MCV 92 02/02/2024 1643   MCV 90 02/02/2024 1643   MCH 30.2 02/17/2024 0940   MCHC 32.7 02/17/2024 0940   RDW 15.1 02/17/2024 0940   RDW 14.4 02/02/2024 1643   RDW 14.0 02/02/2024 1643   LYMPHSABS 3.0 02/17/2024 0940   LYMPHSABS 2.7 02/02/2024 1643   MONOABS 0.6 02/17/2024 0940   EOSABS 0.3 02/17/2024 0940   EOSABS 0.2 02/02/2024 1643   BASOSABS 0.1 02/17/2024 0940   BASOSABS 0.1 02/02/2024 1643      Parts of this note may have been  dictated using voice recognition software. There may be variances in spelling and vocabulary which are unintentional. Not all errors are proofread. Please notify the dino if any discrepancies are noted or if the meaning of any statement is not clear.

## 2024-07-12 ENCOUNTER — Ambulatory Visit (HOSPITAL_COMMUNITY): Admitting: Psychiatry

## 2024-07-16 ENCOUNTER — Other Ambulatory Visit: Payer: Self-pay | Admitting: Allergy & Immunology

## 2024-07-17 ENCOUNTER — Other Ambulatory Visit: Payer: Self-pay

## 2024-07-17 ENCOUNTER — Emergency Department (HOSPITAL_COMMUNITY)

## 2024-07-17 ENCOUNTER — Emergency Department (HOSPITAL_COMMUNITY)
Admission: EM | Admit: 2024-07-17 | Discharge: 2024-07-17 | Disposition: A | Discharge status: Home / Self Care | Attending: Emergency Medicine | Admitting: Emergency Medicine

## 2024-07-17 ENCOUNTER — Encounter (HOSPITAL_COMMUNITY): Payer: Self-pay | Admitting: Emergency Medicine

## 2024-07-17 ENCOUNTER — Ambulatory Visit: Payer: Self-pay

## 2024-07-17 DIAGNOSIS — R600 Localized edema: Secondary | ICD-10-CM | POA: Diagnosis not present

## 2024-07-17 DIAGNOSIS — R2232 Localized swelling, mass and lump, left upper limb: Secondary | ICD-10-CM | POA: Insufficient documentation

## 2024-07-17 DIAGNOSIS — M7989 Other specified soft tissue disorders: Secondary | ICD-10-CM

## 2024-07-17 DIAGNOSIS — J9811 Atelectasis: Secondary | ICD-10-CM | POA: Diagnosis not present

## 2024-07-17 DIAGNOSIS — I10 Essential (primary) hypertension: Secondary | ICD-10-CM | POA: Diagnosis not present

## 2024-07-17 LAB — COMPREHENSIVE METABOLIC PANEL WITH GFR
ALT: 23 U/L (ref 0–44)
AST: 25 U/L (ref 15–41)
Albumin: 4 g/dL (ref 3.5–5.0)
Alkaline Phosphatase: 84 U/L (ref 38–126)
Anion gap: 10 (ref 5–15)
BUN: 18 mg/dL (ref 8–23)
CO2: 26 mmol/L (ref 22–32)
Calcium: 9.3 mg/dL (ref 8.9–10.3)
Chloride: 104 mmol/L (ref 98–111)
Creatinine, Ser: 0.7 mg/dL (ref 0.44–1.00)
GFR, Estimated: >60 mL/min (ref >60)
Glucose, Bld: 125 mg/dL — ABNORMAL HIGH (ref 70–99)
Potassium: 3.6 mmol/L (ref 3.5–5.1)
Sodium: 140 mmol/L (ref 135–145)
Total Bilirubin: 1 mg/dL (ref 0.0–1.2)
Total Protein: 7.1 g/dL (ref 6.5–8.1)

## 2024-07-17 LAB — CBC WITH DIFFERENTIAL/PLATELET
Abs Immature Granulocytes: 0.03 K/uL (ref 0.00–0.07)
Basophils Absolute: 0.1 K/uL (ref 0.0–0.1)
Basophils Relative: 1 %
Eosinophils Absolute: 0.2 K/uL (ref 0.0–0.5)
Eosinophils Relative: 3 %
HCT: 49.6 % — ABNORMAL HIGH (ref 36.0–46.0)
Hemoglobin: 16.6 g/dL — ABNORMAL HIGH (ref 12.0–15.0)
Immature Granulocytes: 0 %
Lymphocytes Relative: 41 %
Lymphs Abs: 3.2 K/uL (ref 0.7–4.0)
MCH: 30.9 pg (ref 26.0–34.0)
MCHC: 33.5 g/dL (ref 30.0–36.0)
MCV: 92.4 fL (ref 80.0–100.0)
Monocytes Absolute: 0.7 K/uL (ref 0.1–1.0)
Monocytes Relative: 9 %
Neutro Abs: 3.6 K/uL (ref 1.7–7.7)
Neutrophils Relative %: 46 %
Platelets: 269 K/uL (ref 150–400)
RBC: 5.37 MIL/uL — ABNORMAL HIGH (ref 3.87–5.11)
RDW: 15.2 % (ref 11.5–15.5)
WBC: 7.8 K/uL (ref 4.0–10.5)
nRBC: 0 % (ref 0.0–0.2)

## 2024-07-17 LAB — TROPONIN I (HIGH SENSITIVITY): Troponin I (High Sensitivity): 15 ng/L (ref <18)

## 2024-07-17 MED ORDER — IOHEXOL 350 MG/ML SOLN
75.0000 mL | Freq: Once | INTRAVENOUS | Status: AC | PRN
Start: 2024-07-17 — End: 2024-07-17
  Administered 2024-07-17: 75 mL via INTRAVENOUS

## 2024-07-17 NOTE — ED Provider Notes (Signed)
 [place] EMERGENCY DEPARTMENT AT [facility] Provider Note   CSN: [id number] Arrival date & time: 07/17/24  1522     Patient presents with: Arm Swelling   Summer [name] is a 63 y.o. female.   Patient is a 63 year old female who presents to the emergency department the chief complaint of swelling to her left upper arm which has been ongoing for approximate the past 2 weeks.  She notes that she is due to see her PCP tomorrow for this complaint.  Patient notes that she has had no recent falls or blunt trauma to the area.  She does note that she has had some mild overlying warmth.  She denies any numbness or paresthesias distally.  She notes that the site is tender to palpation.  She denies any associated pain to head, neck or back.  She does note that she has had some intermittent chest pain today as well.  She denies any associated abdominal pain, nausea, vomiting, diarrhea.  She has had some mild shortness of breath also.  She denies any other extremity swelling.        Prior to Admission medications   Medication Sig Start Date End Date Taking? Authorizing Provider  albuterol  (PROVENTIL ) (2.5 MG/3ML) 0.083% nebulizer solution USE 1 VIAL IN NEBULIZER EVERY 6 HOURS AS NEEDED FOR WHEEZING OR SHORTNESS OF BREATH 07/11/24   Iva Marty Saltness, MD  ALPRAZolam  (XANAX ) 0.5 MG tablet Take 1 tablet (0.5 mg total) by mouth 2 (two) times daily as needed. for anxiety 04/16/24   Alphonsa Glendia LABOR, MD  Ascorbic Acid (VITAMIN C) 500 MG CAPS Take 500 mg by mouth daily.    [provider]  B Complex-C (SUPER B COMPLEX PO) Take 1 tablet by mouth daily at 6 (six) AM.    [provider]  budesonide -formoterol  (SYMBICORT ) 80-4.5 MCG/ACT inhaler Inhale 2 puffs twice a day with spacer to help prevent cough and wheeze 11/09/23   Iva Marty Saltness, MD  cetirizine  (ZYRTEC ) 10 MG tablet Take 2 tablets (20 mg total) by mouth 2 (two) times daily. Patient taking differently: Take 20 mg  by mouth daily. 11/09/23   Iva Marty Saltness, MD  Cholecalciferol (VITAMIN D-3) 5000 UNITS TABS Take 5,000 Units by mouth daily.    [provider]  cromolyn  (GASTROCROM ) 100 MG/5ML solution TAKE 5 ML BY MOUTH  4 TIMES DAILY( BEFORE MEAL(S) AND AT BEDTIME). 11/09/23   Iva Marty Saltness, MD  cromolyn  (INTAL ) 20 MG/2ML nebulizer solution Take 2 mLs (20 mg total) by nebulization in the morning, at noon, in the evening, and at bedtime. 01/14/23   Iva Marty Saltness, MD  cromolyn  (OPTICROM ) 4 % ophthalmic solution 2 drops 2 (two) times daily.    [provider]  dexlansoprazole  (DEXILANT ) 60 MG capsule Take 1 capsule (60 mg total) by mouth daily. 02/20/24   Carlan, Chelsea L, NP  EPINEPHrine  0.3 mg/0.3 mL IJ SOAJ injection INJECT CONTENTS OF 1 PEN AS NEEDED FOR ANAPHYLAXIS 09/28/23   Iva Marty Saltness, MD  famotidine  (PEPCID ) 40 MG tablet Take 1 tablet (40 mg total) by mouth 2 (two) times daily. 11/09/23 07/11/24  Iva Marty Saltness, MD  fluticasone  (FLONASE ) 50 MCG/ACT nasal spray Place 2 sprays into both nostrils daily. 10/17/23   Malachy Comer GAILS, NP  furosemide  (LASIX ) 20 MG tablet 1  qam prn edema 01/04/24   Alphonsa Glendia LABOR, MD  hydrOXYzine  (ATARAX ) 25 MG tablet TAKE 2 TABLETS BY MOUTH AT BEDTIME 07/16/24   Iva Marty  Louis, MD  ibuprofen  (ADVIL ) 600 MG tablet Take 1 tablet (600 mg total) by mouth every 8 (eight) hours as needed. 01/20/24   Alphonsa Glendia LABOR, MD  levocetirizine (XYZAL ) 5 MG tablet Take one to two tablets at night. 11/09/23   Iva Marty Saltness, MD  Magnesium 200 MG TABS Take 200 mg by mouth 2 (two) times daily.    [provider]  Naltrexone  HCl, Pain, 4.5 MG CAPS Take contents of half a capsule nightly for two weeks and then increase to one capsule nightly thereafter. 02/18/23   Iva Marty Saltness, MD  Omega-3 Fatty Acids (FISH OIL) 1200 MG CAPS Take 1,200 mg by mouth daily.    [provider]  potassium chloride  SA (KLOR-CON  M) 20 MEQ  tablet 1 every day prn when taking lasix  01/04/24   Alphonsa Glendia LABOR, MD  pregabalin  (LYRICA ) 25 MG capsule Take 1 capsule (25 mg total) by mouth 2 (two) times daily. 02/02/24   Alphonsa Glendia LABOR, MD  Probiotic Product (PROBIOTIC PO) Take 100 mg by mouth daily.    [provider]  RESTASIS 0.05 % ophthalmic emulsion Place 1 drop into both eyes daily. 10/08/22   [provider]  rosuvastatin  (CRESTOR ) 5 MG tablet Take 1 tablet (5 mg total) by mouth daily. 01/05/23   Alphonsa Glendia LABOR, MD  thyroid  (NP THYROID ) 60 MG tablet TAKE 1 TABLET BY MOUTH ONCE DAILY BEFORE BREAKFAST 12/05/23   Alphonsa Glendia LABOR, MD  VENTOLIN  HFA 108 (90 Base) MCG/ACT inhaler INHALE 2 PUFFS BY MOUTH EVERY 6 HOURS AS NEEDED FOR WHEEZING AND FOR SHORTNESS OF BREATH 07/11/24   Iva Marty Saltness, MD    Allergies: Bee venom, Molds & smuts, Shellfish allergy, Avelox [moxifloxacin hcl in nacl], Moxifloxacin, Ciprofloxacin, Biaxin  [clarithromycin ], and Tape    Review of Systems  Musculoskeletal:        Swelling to left upper extremity  All other systems reviewed and are negative.   Updated Vital Signs BP (!) 160/88 (BP Location: Right Arm)   Pulse 80   Temp 98.5 F (36.9 C) (Oral)   Resp 20   Ht 5' 4 (1.626 m)   Wt (!) 140.6 kg   LMP 01/23/2014 Comment: spotting  SpO2 100%   BMI 53.21 kg/m   Physical Exam Vitals and nursing note reviewed.  Constitutional:      General: She is not in acute distress.    Appearance: Normal appearance. She is not ill-appearing.  HENT:     Head: Normocephalic and atraumatic.     Nose: Nose normal.     Mouth/Throat:     Mouth: Mucous membranes are moist.  Eyes:     Extraocular Movements: Extraocular movements intact.     Conjunctiva/sclera: Conjunctivae normal.     Pupils: Pupils are equal, round, and reactive to light.  Cardiovascular:     Rate and Rhythm: Normal rate and regular rhythm.     Pulses: Normal pulses.     Heart sounds: Normal heart sounds. No murmur heard.     No gallop.  Pulmonary:     Effort: Pulmonary effort is normal. No respiratory distress.     Breath sounds: Normal breath sounds. No stridor. No wheezing, rhonchi or rales.  Abdominal:     General: Abdomen is flat. Bowel sounds are normal. There is no distension.     Palpations: Abdomen is soft.     Tenderness: There is no abdominal tenderness. There is no guarding.  Musculoskeletal:  General: Normal range of motion.     Cervical back: Normal range of motion and neck supple. No rigidity or tenderness.     Comments: Mild edema noted to the proximal aspect of the left upper extremity, no swelling remaining to bilateral upper and lower extremities, no bony tenderness noted throughout, radial pulses 2+ upper extremities and cap refill less than 2 seconds distally, full range of motion noted throughout, no overlying erythema or warmth directly to the shoulder, no obvious deformity or bruising, no skin breakdown or ulceration, no lacerations or abrasions  Skin:    General: Skin is warm and dry.  Neurological:     General: No focal deficit present.     Mental Status: She is alert and oriented to person, place, and time. Mental status is at baseline.  Psychiatric:        Mood and Affect: Mood normal.        Behavior: Behavior normal.        Thought Content: Thought content normal.        Judgment: Judgment normal.     (all labs ordered are listed, but only abnormal results are displayed) Labs Reviewed  COMPREHENSIVE METABOLIC PANEL WITH GFR - Abnormal; Notable for the following components:      Result Value   Glucose, Bld 125 (*)    All other components within normal limits  CBC WITH DIFFERENTIAL/PLATELET - Abnormal; Notable for the following components:   RBC 5.37 (*)    Hemoglobin 16.6 (*)    HCT 49.6 (*)    All other components within normal limits  TROPONIN I (HIGH SENSITIVITY)    EKG: None  Radiology: CT Angio Chest PE W/Cm &/Or Wo Cm Result Date: 07/17/2024 CLINICAL  DATA:  Provided history: Pulmonary embolism (PE) suspected, high prob Left arm swelling. EXAM: CT ANGIOGRAPHY CHEST WITH CONTRAST TECHNIQUE: Multidetector CT imaging of the chest was performed using the standard protocol during bolus administration of intravenous contrast. Multiplanar CT image reconstructions and MIPs were obtained to evaluate the vascular anatomy. RADIATION DOSE REDUCTION: This exam was performed according to the departmental dose-optimization program which includes automated exposure control, adjustment of the mA and/or kV according to patient size and/or use of iterative reconstruction technique. CONTRAST:  75mL OMNIPAQUE  IOHEXOL  350 MG/ML SOLN COMPARISON:  Chest radiograph 04/22/2023.  Chest CTA 05/12/2021 FINDINGS: Cardiovascular: There are no filling defects within the pulmonary arteries to suggest pulmonary embolus. Subsegmental branches assessment is limited due to contrast bolus timing and soft tissue attenuation from habitus. The heart is upper normal in size. Thoracic aorta is normal in caliber without acute aortic finding. No pericardial effusion. Mediastinum/Nodes: No mediastinal or hilar adenopathy. No esophageal wall thickening. No thyroid  nodule Lungs/Pleura: Subsegmental atelectasis in the dependent lower lobes. No confluent opacity. No features of pulmonary edema no pleural fluid. Trachea and central airways are clear. Upper Abdomen: No acute findings. Musculoskeletal: Thoracic spondylosis. There are no acute or suspicious osseous abnormalities. Review of the MIP images confirms the above findings. IMPRESSION: 1. No pulmonary embolus. 2. Subsegmental atelectasis in the dependent lower lobes. Electronically Signed   By: Andrea Gasman M.D.   On: 07/17/2024 22:07   US  Venous Img Upper Left (DVT Study) Result Date: 07/17/2024 CLINICAL DATA:  Left upper extremity edema. EXAM: LEFT UPPER EXTREMITY VENOUS DOPPLER ULTRASOUND TECHNIQUE: Gray-scale sonography with graded compression,  as well as color Doppler and duplex ultrasound were performed to evaluate the upper extremity deep venous system from the level of the subclavian vein and  including the jugular, axillary, basilic, radial, ulnar and upper cephalic vein. Spectral Doppler was utilized to evaluate flow at rest and with distal augmentation maneuvers. COMPARISON:  None Available. FINDINGS: Contralateral Subclavian Vein: Respiratory phasicity is normal and symmetric with the symptomatic side. No evidence of thrombus. Normal compressibility. Internal Jugular Vein: No evidence of thrombus. Normal compressibility, respiratory phasicity and response to augmentation. Subclavian Vein: No evidence of thrombus. Normal compressibility, respiratory phasicity and response to augmentation. Axillary Vein: No evidence of thrombus. Normal compressibility, respiratory phasicity and response to augmentation. Cephalic Vein: No evidence of thrombus. Normal compressibility, respiratory phasicity and response to augmentation. Basilic Vein: No evidence of thrombus. Normal compressibility, respiratory phasicity and response to augmentation. Brachial Veins: No evidence of thrombus. Normal compressibility, respiratory phasicity and response to augmentation. Radial Veins: No evidence of thrombus. Normal compressibility, respiratory phasicity and response to augmentation. Ulnar Veins: No evidence of thrombus. Normal compressibility, respiratory phasicity and response to augmentation. Venous Reflux:  None visualized. Other Findings: No evidence of superficial thrombophlebitis or abnormal fluid collection. IMPRESSION: No evidence of DVT within the left upper extremity. Electronically Signed   By: Marcey Moan M.D.   On: 07/17/2024 18:30     Procedures   Medications Ordered in the ED  iohexol  (OMNIPAQUE ) 350 MG/ML injection 75 mL (75 mLs Intravenous Contrast Given 07/17/24 2147)                                    Medical Decision Making Patient is doing  well at this time and is stable for discharge home.  Discussed with patient as her workup in the emergency department has been unremarkable.  Venous duplex demonstrated no indication for DVT.  She has no indication for acute arterial occlusion as she has strong peripheral pulses and good cap refill.  Do not suspect underlying etiology such as cellulitis or abscess formation at this point.  CTA of the chest demonstrated no indication for pulmonary embolus.  Vital signs are otherwise stable at this point.  Do not specked any further workup or advanced imaging is warranted on an emergent basis.  She is due to see her PCP in the morning she was directed to keep this appointment.  Strict return precautions were provided for any new or worsening symptoms.  Patient voiced understanding and had no additional questions.  Do not suspect that she has suffered from underlying etiology such as septic joint or gout.  Amount and/or Complexity of Data Reviewed Labs: ordered. Radiology: ordered.  Risk Prescription drug management.        Final diagnoses:  Swelling of left upper extremity    ED Discharge Orders     None          Daralene Lonni JONETTA DEVONNA 07/17/24 2222    Patsey Lot, MD 07/17/24 [phone]

## 2024-07-17 NOTE — Telephone Encounter (Signed)
 FYI Only or Action Required?: Action required by provider: update on patient condition.  Patient was last seen in primary care on 04/16/2024 by Alphonsa Glendia LABOR, MD.  Called Nurse Triage reporting Arm Pain.  Symptoms began several weeks ago.  Interventions attempted: Nothing.  Symptoms are: gradually worsening.  Triage Disposition: See Physician Within 24 Hours  Patient/caregiver understands and will follow disposition?: Yes  Copied from CRM [id number]. Topic: Clinical - Red Word Triage >> Jul 17, 2024 12:22 PM Turkey B wrote: Patient left arm at top is swollen and says left hand is cold Reason for Disposition  Numbness (i.e., loss of sensation) in hand or fingers  (Exceptions: Just tingling; numbness present > 2 weeks.)  Answer Assessment - Initial Assessment Questions 1. ONSET: When did the pain start?     A couple of weeks ago 2. LOCATION: Where is the pain located?     Left arm pain 3. PAIN: How bad is the pain? (Scale 0-10; or none, mild, moderate, severe)     Achy pain 4/10 4. WORK OR EXERCISE: Has there been any recent work or exercise that involved this part of the body?     denies 5. CAUSE: What do you think is causing the arm pain?     unknown 6. OTHER SYMPTOMS: Do you have any other symptoms? (e.g., neck pain, swelling, rash, fever, numbness, weakness)     Upper arm swelling between shoulder and elbow.  Left hand is cold, but not discolored. Reports a little bit of tingling 7. PREGNANCY: Is there any chance you are pregnant? When was your last menstrual period?     N/a  States that she will proceed to ED if symptoms escalate  Protocols used: Arm Pain-A-AH

## 2024-07-17 NOTE — ED Triage Notes (Signed)
 Pt to the ED with complaints of left upper arm swelling without known injury for the past 2 weeks.

## 2024-07-17 NOTE — Discharge Instructions (Signed)
 Please continue to follow-up closely with your primary care doctor on an outpatient basis.  Return to emergency department immediately for any new or worsening symptoms.

## 2024-07-18 ENCOUNTER — Ambulatory Visit (INDEPENDENT_AMBULATORY_CARE_PROVIDER_SITE_OTHER): Admitting: Physician Assistant

## 2024-07-18 ENCOUNTER — Encounter: Payer: Self-pay | Admitting: Physician Assistant

## 2024-07-18 VITALS — BP 128/82 | HR 85 | Ht 64.0 in

## 2024-07-18 DIAGNOSIS — M7989 Other specified soft tissue disorders: Secondary | ICD-10-CM | POA: Diagnosis not present

## 2024-07-18 NOTE — Assessment & Plan Note (Signed)
 Left upper arm swelling and pain for two weeks with worsening symptoms. No trauma or injury. ER ruled out blood clots and arterial occlusion. Swelling present on left mid upper extremity. No skin color changes. Mild tenderness. Radial pulse intact. Normal cap refill and sensation in all fingers.  - Focal soft tissue ultrasound of the left upper arm to evaluate for fluid collection or swelling. - Advise ibuprofen  for pain management as needed - Schedule follow-up with Doctor Alphonsa in 2-4 weeks. - Advise to return sooner if symptoms worsen.

## 2024-07-18 NOTE — Progress Notes (Signed)
 Acute Office Visit  Subjective:     Patient ID: Summer [name], female    DOB: Aug 23, 1961, 63 y.o.   MRN: [id number]  Discussed the use of AI scribe software for clinical note transcription with the patient, who gave verbal consent to proceed.  History of Present Illness Summer [name] is a 63 year old female who presents with left arm swelling and pain.  She has experienced swelling and pain in her left arm for two weeks, with swelling from the middle of the upper arm to the elbow with an achy sensation. She voices concern for her hand showing a bluish tint and becoming cold and painful after exposure to cold temperatures. There is no recent trauma or injury to the arm.  She visited the emergency room the yesterday due to concerns of a possible blood clot. Lab work, an EKG, and imaging performed to evaluate for blood clots in her arm and lungs, DVT, PE, and arterial occlusion ruled out.   Her past medical history includes mast cell activation disorder and fatty liver. She avoids Tylenol  and prefers not to take ibuprofen  unless necessary due to her history of fatty liver. She is concerned about lack of answers during he ER visit.   Review of Systems  Constitutional:  Negative for fever, malaise/fatigue and weight loss.  Respiratory:  Negative for shortness of breath.   Cardiovascular:  Negative for chest pain and palpitations.  Musculoskeletal:  Positive for joint pain and myalgias. Negative for back pain, falls and neck pain.  Skin:  Negative for itching and rash.        Objective:     BP 128/82   Pulse 85   Ht 5' 4 (1.626 m)   LMP 01/23/2014 Comment: spotting  SpO2 98%   BMI 53.21 kg/m   Physical Exam Constitutional:      Appearance: Normal appearance. She is obese.  HENT:     Head: Normocephalic and atraumatic.     Mouth/Throat:     Mouth: Mucous membranes are moist.     Pharynx: Oropharynx is clear.  Eyes:     Extraocular Movements: Extraocular movements  intact.     Conjunctiva/sclera: Conjunctivae normal.  Cardiovascular:     Rate and Rhythm: Normal rate and regular rhythm.     Heart sounds: Normal heart sounds. No murmur heard. Pulmonary:     Effort: Pulmonary effort is normal.     Breath sounds: Normal breath sounds. No wheezing or rales.  Musculoskeletal:     Left upper arm: Swelling and tenderness present. No deformity or bony tenderness.     Right lower leg: No edema.     Left lower leg: No edema.  Skin:    General: Skin is warm and dry.  Neurological:     General: No focal deficit present.     Mental Status: She is alert and oriented to person, place, and time.  Psychiatric:        Mood and Affect: Mood normal.        Behavior: Behavior normal.     No results found for any visits on 07/18/24.      Assessment & Plan:  Left upper extremity swelling Assessment & Plan: Left upper arm swelling and pain for two weeks with worsening symptoms. No trauma or injury. ER ruled out blood clots and arterial occlusion. Swelling present on left mid upper extremity. No skin color changes. Mild tenderness. Radial pulse intact. Normal cap refill and sensation in all fingers.  -  Focal soft tissue ultrasound of the left upper arm to evaluate for fluid collection or swelling. - Advise ibuprofen  for pain management as needed - Schedule follow-up with Doctor Alphonsa in 2-4 weeks. - Advise to return sooner if symptoms worsen.  Orders: -     US  SOFT TISSUE UPPER EXTREMITY LIMITED LEFT (NON-VASCULAR)   Return 2-4 w/ Dr.Luking.  Charmaine Dovey Fatzinger, PA-C

## 2024-07-24 ENCOUNTER — Ambulatory Visit (HOSPITAL_COMMUNITY)
Admission: RE | Admit: 2024-07-24 | Discharge: 2024-07-24 | Disposition: A | Discharge status: Home / Self Care | Source: Ambulatory Visit | Attending: Physician Assistant | Admitting: Physician Assistant

## 2024-07-24 DIAGNOSIS — R2231 Localized swelling, mass and lump, right upper limb: Secondary | ICD-10-CM | POA: Diagnosis not present

## 2024-07-24 DIAGNOSIS — M7989 Other specified soft tissue disorders: Secondary | ICD-10-CM | POA: Insufficient documentation

## 2024-07-26 ENCOUNTER — Ambulatory Visit (HOSPITAL_COMMUNITY): Admitting: Psychiatry

## 2024-07-31 ENCOUNTER — Other Ambulatory Visit: Payer: Self-pay | Admitting: Allergy & Immunology

## 2024-08-01 ENCOUNTER — Ambulatory Visit (INDEPENDENT_AMBULATORY_CARE_PROVIDER_SITE_OTHER): Admitting: Family Medicine

## 2024-08-01 ENCOUNTER — Encounter: Payer: Self-pay | Admitting: Family Medicine

## 2024-08-01 ENCOUNTER — Ambulatory Visit (HOSPITAL_COMMUNITY)
Admission: RE | Admit: 2024-08-01 | Discharge: 2024-08-01 | Disposition: A | Discharge status: Home / Self Care | Source: Ambulatory Visit | Attending: Family Medicine | Admitting: Family Medicine

## 2024-08-01 VITALS — BP 136/86 | HR 81 | Temp 97.9°F | Ht 64.0 in | Wt 314.0 lb

## 2024-08-01 DIAGNOSIS — M7989 Other specified soft tissue disorders: Secondary | ICD-10-CM

## 2024-08-01 DIAGNOSIS — M542 Cervicalgia: Secondary | ICD-10-CM

## 2024-08-01 DIAGNOSIS — R29898 Other symptoms and signs involving the musculoskeletal system: Secondary | ICD-10-CM | POA: Diagnosis not present

## 2024-08-01 DIAGNOSIS — M47812 Spondylosis without myelopathy or radiculopathy, cervical region: Secondary | ICD-10-CM | POA: Diagnosis not present

## 2024-08-01 DIAGNOSIS — M4312 Spondylolisthesis, cervical region: Secondary | ICD-10-CM | POA: Diagnosis not present

## 2024-08-01 DIAGNOSIS — M4802 Spinal stenosis, cervical region: Secondary | ICD-10-CM | POA: Diagnosis not present

## 2024-08-01 DIAGNOSIS — M5030 Other cervical disc degeneration, unspecified cervical region: Secondary | ICD-10-CM | POA: Diagnosis not present

## 2024-08-01 NOTE — Progress Notes (Signed)
   Subjective:    Patient ID: Summer [name], female    DOB: April 23, 1961, 63 y.o.   MRN: [id number]  HPI ER follow up - left arm and shoulder swelling , tingling , numbness Dropping things , cold feeling  I reviewed over previous notes and ER notes Patient's had swelling of the left upper arm for no apparent reason She relates a lot of pain in the arm she relates weakness in the arm she is dropping a lot of things that she used to be able to hold including cups utensils objects She describes a burning pain in the back of her neck radiates to the left side of the neck and down into the arm she relates numbness and tingling into the hand She states she has tried over-the-counter measures without any success Is difficult for her to rest and sleep She has had ultrasound which did not show any mass or cyst Ultrasound of the veins did not show any blood clot Patient suffers with morbid obesity has a history of back related orthopedic issues Has not had surgery prior to today's visit Review of Systems     Objective:   Physical Exam  Lungs are clear respiratory rate normal pulses normal blood pressure good swelling of the left upper arm is noted but there is no swelling of the lower arm warmth is normal color normal subjective discomfort in the left arm weakness noted in the left hand and some of the muscles of the left arm including bicep grip strength and pincer Right side is normal      Assessment & Plan:  1. Left arm weakness (Primary) More than likely patient will need MRI with a large machine in [facility] imaging Cervical x-ray ordered today - DG Cervical Spine 2 or 3 views  2. Left upper extremity swelling Ultrasound was negative for any DVT I do not find any evidence of a mass on physical exam ultrasound not revealing - DG Cervical Spine 2 or 3 views  3. Cervical pain We will pursue forward with MRI to look at cervical pain with radiculopathy down the left arm and  left arm weakness - DG Cervical Spine 2 or 3 views

## 2024-08-02 ENCOUNTER — Other Ambulatory Visit: Payer: Self-pay

## 2024-08-02 DIAGNOSIS — R29898 Other symptoms and signs involving the musculoskeletal system: Secondary | ICD-10-CM

## 2024-08-02 DIAGNOSIS — M542 Cervicalgia: Secondary | ICD-10-CM

## 2024-08-09 ENCOUNTER — Ambulatory Visit: Payer: Self-pay | Admitting: Family Medicine

## 2024-08-09 ENCOUNTER — Other Ambulatory Visit: Payer: Self-pay | Admitting: Allergy & Immunology

## 2024-08-13 ENCOUNTER — Encounter (INDEPENDENT_AMBULATORY_CARE_PROVIDER_SITE_OTHER): Payer: Self-pay | Admitting: Gastroenterology

## 2024-08-13 ENCOUNTER — Ambulatory Visit (HOSPITAL_COMMUNITY)

## 2024-08-13 ENCOUNTER — Other Ambulatory Visit: Payer: Self-pay | Admitting: Allergy & Immunology

## 2024-08-15 ENCOUNTER — Encounter (INDEPENDENT_AMBULATORY_CARE_PROVIDER_SITE_OTHER): Payer: Self-pay | Admitting: Gastroenterology

## 2024-08-17 ENCOUNTER — Inpatient Hospital Stay: Attending: Oncology

## 2024-08-17 DIAGNOSIS — D8940 Mast cell activation, unspecified: Secondary | ICD-10-CM | POA: Diagnosis not present

## 2024-08-17 DIAGNOSIS — D751 Secondary polycythemia: Secondary | ICD-10-CM | POA: Insufficient documentation

## 2024-08-17 DIAGNOSIS — D582 Other hemoglobinopathies: Secondary | ICD-10-CM

## 2024-08-17 DIAGNOSIS — M7989 Other specified soft tissue disorders: Secondary | ICD-10-CM | POA: Diagnosis not present

## 2024-08-17 LAB — COMPREHENSIVE METABOLIC PANEL WITH GFR
ALT: 16 U/L (ref 0–44)
AST: 24 U/L (ref 15–41)
Albumin: 4.3 g/dL (ref 3.5–5.0)
Alkaline Phosphatase: 92 U/L (ref 38–126)
Anion gap: 11 (ref 5–15)
BUN: 16 mg/dL (ref 8–23)
CO2: 27 mmol/L (ref 22–32)
Calcium: 9.4 mg/dL (ref 8.9–10.3)
Chloride: 103 mmol/L (ref 98–111)
Creatinine, Ser: 0.73 mg/dL (ref 0.44–1.00)
GFR, Estimated: >60 mL/min (ref >60)
Glucose, Bld: 138 mg/dL — ABNORMAL HIGH (ref 70–99)
Potassium: 4.1 mmol/L (ref 3.5–5.1)
Sodium: 140 mmol/L (ref 135–145)
Total Bilirubin: 0.8 mg/dL (ref 0.0–1.2)
Total Protein: 7.1 g/dL (ref 6.5–8.1)

## 2024-08-17 LAB — CBC WITH DIFFERENTIAL/PLATELET
Abs Immature Granulocytes: 0.03 K/uL (ref 0.00–0.07)
Basophils Absolute: 0.1 K/uL (ref 0.0–0.1)
Basophils Relative: 1 %
Eosinophils Absolute: 0.2 K/uL (ref 0.0–0.5)
Eosinophils Relative: 3 %
HCT: 50.1 % — ABNORMAL HIGH (ref 36.0–46.0)
Hemoglobin: 16.8 g/dL — ABNORMAL HIGH (ref 12.0–15.0)
Immature Granulocytes: 0 %
Lymphocytes Relative: 37 %
Lymphs Abs: 2.8 K/uL (ref 0.7–4.0)
MCH: 31.2 pg (ref 26.0–34.0)
MCHC: 33.5 g/dL (ref 30.0–36.0)
MCV: 92.9 fL (ref 80.0–100.0)
Monocytes Absolute: 0.7 K/uL (ref 0.1–1.0)
Monocytes Relative: 9 %
Neutro Abs: 3.8 K/uL (ref 1.7–7.7)
Neutrophils Relative %: 50 %
Platelets: 278 K/uL (ref 150–400)
RBC: 5.39 MIL/uL — ABNORMAL HIGH (ref 3.87–5.11)
RDW: 14.7 % (ref 11.5–15.5)
WBC: 7.6 K/uL (ref 4.0–10.5)
nRBC: 0 % (ref 0.0–0.2)

## 2024-08-20 ENCOUNTER — Other Ambulatory Visit: Payer: Self-pay

## 2024-08-20 DIAGNOSIS — E063 Autoimmune thyroiditis: Secondary | ICD-10-CM

## 2024-08-21 DIAGNOSIS — Z0120 Encounter for dental examination and cleaning without abnormal findings: Secondary | ICD-10-CM | POA: Diagnosis not present

## 2024-08-24 ENCOUNTER — Inpatient Hospital Stay: Admitting: Oncology

## 2024-08-24 ENCOUNTER — Encounter: Payer: Self-pay | Admitting: Family Medicine

## 2024-08-24 ENCOUNTER — Other Ambulatory Visit

## 2024-08-24 ENCOUNTER — Other Ambulatory Visit: Payer: Self-pay | Admitting: Oncology

## 2024-08-24 VITALS — BP 167/94 | HR 83 | Temp 97.5°F | Resp 19 | Wt 313.1 lb

## 2024-08-24 DIAGNOSIS — I890 Lymphedema, not elsewhere classified: Secondary | ICD-10-CM

## 2024-08-24 DIAGNOSIS — N644 Mastodynia: Secondary | ICD-10-CM | POA: Diagnosis not present

## 2024-08-24 DIAGNOSIS — D751 Secondary polycythemia: Secondary | ICD-10-CM | POA: Diagnosis not present

## 2024-08-24 NOTE — Progress Notes (Signed)
 HEMATOLOGY-ONCOLOGY PROGRESS NOTE  ASSESSMENT AND PLAN: 1.  Secondary polycythemia This is likely secondary to sleep apnea or hemoconcentration from Lasix .  She has no evidence of MPN or clonal erythrocytosis at this time.  Recent lab work from 08/17/2024 has been reviewed which shows a stable hemoglobin of 16.8 and stable hematocrit 50.1.  Her white blood cell count and platelet count are completely normal.  CMP from the same date showed normal electrolytes, renal function, liver function studies.  Treatment of secondary polycythemia is aimed at treating the underlying cause which in her case of sleep apnea.  Recommend continuation of CPAP.  2.  Left upper arm swelling The patient is experiencing left upper arm swelling.  Etiology is unclear.  She is having some tenderness in this area.  She has had multiple imaging studies completed which showed no evidence for a blood clot and no enlarged lymph nodes.  I offered her referral to the lymphedema clinic and she would like to try this.  Unsure how well she will tolerate this due to the discomfort she is having in the left upper extremity but certainly worth an evaluation.  She is also overdue for a mammogram.  Given that she is having pain to her bilateral breasts and the etiology of her left arm swelling is unclear, I have ordered a bilateral diagnostic mammogram.  3.  Mast cell activation syndrome She is currently being followed by allergy and asthma for this condition.  Currently on multiple medications for this condition.  She feels that her symptoms are not well-controlled at this time.  She wonders if a hematologist referral would be appropriate.  I advised her that I had limited experience with this condition.,  I will review her chart and discussed with hematology.  However, I did advise her that it may be beneficial to go to a tertiary care center who may have more experience with her condition.  Her tryptase level on 04/22/2023 was normal at  5.2.  Follow-up: Labs/office visit in about 6 months  SUBJECTIVE: Ms. Friddle returns for scheduled follow-up.  She was last seen in April 2025 for secondary erythrocytosis.  She had lab work completed on 08/17/2024 which showed a WBC of 7.6, RBC 5.39, hemoglobin 16.8, hematocrit 50.1, MCV 92.9, and platelets 278,000.  Overall, this is stable for her.  The patient is having difficulty with left arm pain and swelling.  She has had multiple imaging studies completed including an ultrasound of the left upper extremity which was negative for DVT, a CTA of the chest which showed no pulmonary embolus and additionally there was no evidence of mediastinal or hilar adenopathy, ultrasound of the left upper extremity was negative and specifically there was no soft tissue edema or fluid collection and no solid or cystic mass lesions.  She is due to have an MRI of this area early next week.  Last mammogram was completed in March 2024.  She has not checked her breasts for any masses.  She does report pain to the bilateral breast.  She also continues to have symptoms related to her mast cell activation syndrome.  She is followed by the allergy and immunology center.  Currently she is on Zyrtec  and Pepcid  twice a day.  She also has an EpiPen  and is on cromolyn  via multiple routes.  She tells me that she has issues with itching and dizziness.  She does not have any hives.  She feels that her skin is burning when exposed to different scents.  The symptoms have been present since she developed COVID a few years ago.  There is also some concern that she may have underlying POTS.  She notes that she is more short of breath lately.   REVIEW OF SYSTEMS:   Review of Systems  Constitutional:  Positive for malaise/fatigue. Negative for chills and fever.  HENT: Negative.    Eyes: Negative.   Respiratory:  Positive for shortness of breath. Negative for cough.   Cardiovascular: Negative.  Negative for chest pain and leg  swelling.  Gastrointestinal: Negative.  Negative for abdominal pain, nausea and vomiting.  Genitourinary: Negative.   Musculoskeletal: Negative.   Skin:  Positive for itching.  Neurological:  Positive for dizziness.  Endo/Heme/Allergies: Negative.   Psychiatric/Behavioral: Negative.      I have reviewed the past medical history, past surgical history, social history and family history with the patient and they are unchanged from previous note.   PHYSICAL EXAMINATION:  Vitals:   08/24/24 1129  BP: (!) 167/94  Pulse: 83  Resp: 19  Temp: (!) 97.5 F (36.4 C)  SpO2: 97%   Filed Weights   08/24/24 1129  Weight: (!) 313 lb 1.6 oz (142 kg)   Physical Exam Vitals reviewed.  Constitutional:      General: She is not in acute distress. HENT:     Head: Normocephalic.  Eyes:     General: No scleral icterus.    Conjunctiva/sclera: Conjunctivae normal.  Cardiovascular:     Rate and Rhythm: Normal rate and regular rhythm.  Pulmonary:     Effort: Pulmonary effort is normal. No respiratory distress.     Breath sounds: Normal breath sounds.  Chest:     Comments: Left breast without palpable masses.  Tenderness noted to the upper outer aspect.  Diffuse tenderness in the right breast without masses. Abdominal:     General: Bowel sounds are normal. There is no distension.     Palpations: Abdomen is soft.  Musculoskeletal:        General: Normal range of motion.     Right lower leg: No edema.     Left lower leg: No edema.  Lymphadenopathy:     Cervical: No cervical adenopathy.     Upper Body:     Left upper body: No supraclavicular or axillary adenopathy.     Comments: Left upper arm swelling and tenderness to palpation.  Skin:    General: Skin is warm and dry.  Neurological:     Mental Status: She is alert and oriented to person, place, and time.  Psychiatric:        Mood and Affect: Mood normal.        Behavior: Behavior normal.        Thought Content: Thought content  normal.        Judgment: Judgment normal.     LABORATORY DATA:  I have reviewed the data as listed    Latest Ref Rng & Units 08/17/2024   10:25 AM 07/17/2024    8:25 PM 02/17/2024    9:40 AM  CMP  Glucose 70 - 99 mg/dL 861  874  825   BUN 8 - 23 mg/dL 16  18  20    Creatinine 0.44 - 1.00 mg/dL 9.26  9.29  9.18   Sodium 135 - 145 mmol/L 140  140  138   Potassium 3.5 - 5.1 mmol/L 4.1  3.6  4.1   Chloride 98 - 111 mmol/L 103  104  106   CO2  22 - 32 mmol/L 27  26  26    Calcium  8.9 - 10.3 mg/dL 9.4  9.3  9.3   Total Protein 6.5 - 8.1 g/dL 7.1  7.1  6.6   Total Bilirubin 0.0 - 1.2 mg/dL 0.8  1.0  0.7   Alkaline Phos 38 - 126 U/L 92  84  95   AST 15 - 41 U/L 24  25  20    ALT 0 - 44 U/L 16  23  21      Lab Results  Component Value Date   WBC 7.6 08/17/2024   HGB 16.8 (H) 08/17/2024   HCT 50.1 (H) 08/17/2024   MCV 92.9 08/17/2024   PLT 278 08/17/2024   NEUTROABS 3.8 08/17/2024    No results found for: CEA1, CEA, CAN199, CA125, PSA1  DG Cervical Spine 2 or 3 views Result Date: 08/07/2024 EXAM: 2 or 3 VIEW(S) XRAY OF THE CERVICAL SPINE 08/01/2024 11:39:00 AM COMPARISON: None available. CLINICAL HISTORY: Left arm weakness and swelling. Patient complains of chronic left arm weakness, recent onset left upper arm swelling. FINDINGS: BONES: No acute fracture. Vertebral body height is preserved. 2 mm anterolisthesis C4-C5, likely degenerative in nature. DISCS AND DEGENERATIVE CHANGES: Space narrowing and endplate remodeling of C4-C7 is present, in keeping with changes of moderate-to-severe degenerative disc disease. Multilevel uncovertebral and facet arthrosis is present, not well profiled on this examination. SOFT TISSUES: No prevertebral soft tissue swelling. The visualized lungs appear clear. IMPRESSION: 1. No acute fracture of the cervical spine. 2. 2 mm anterolisthesis C4-5, likely degenerative in nature. 3. Moderate-to-severe degenerative disc disease with disc space narrowing and  endplate remodeling at C4-C7. Electronically signed by: Dorethia Molt MD 08/07/2024 02:52 AM EDT RP Workstation: [id number]     Future Appointments  Date Time Provider Department Center  08/27/2024  2:00 PM MC-MR 2 [facility]MRI [facility]  09/17/2024 11:00 AM Alphonsa Glendia LABOR, MD [facility] [address]  09/21/2024  3:30 PM Iva Marty Saltness, MD [facility] None  10/05/2024  1:45 PM LB ENDO/NEURO LAB [facility] None  10/11/2024  2:40 PM Dartha Ernst, MD [facility] None  02/22/2025 10:45 AM [facility] LAB [facility] None  03/01/2025 11:30 AM Geofm Delon BRAVO, NP [facility] None     Allean Bud, DNP, AGPCNP-BC, AOCNP 08/24/24

## 2024-08-27 ENCOUNTER — Ambulatory Visit (HOSPITAL_COMMUNITY)

## 2024-08-27 NOTE — Telephone Encounter (Signed)
 Nurses I read through Summer [name]'s message Please do the following  #1-please have referral team tried to find her open MRI-I believe that [facility] imaging has open MRI-she would need the MRI of the cervical spine scheduled there-she suffers with significant obesity which makes standard MRI machine not practical  2.-May have referral to Dr. Alvan for chest pain she would like to be established with him  3.  As for the left upper arm swelling-we have done several test without any specific findings-if she is interested in further opinion I would recommend physical medicine specialist [facility] for further evaluation (Dr Carilyn group) or a different option would be [facility] in [place]

## 2024-08-30 ENCOUNTER — Other Ambulatory Visit: Payer: Self-pay | Admitting: Oncology

## 2024-08-30 DIAGNOSIS — N644 Mastodynia: Secondary | ICD-10-CM

## 2024-08-30 DIAGNOSIS — I890 Lymphedema, not elsewhere classified: Secondary | ICD-10-CM

## 2024-08-31 ENCOUNTER — Other Ambulatory Visit: Payer: Self-pay

## 2024-08-31 ENCOUNTER — Ambulatory Visit: Admitting: "Endocrinology

## 2024-08-31 DIAGNOSIS — R079 Chest pain, unspecified: Secondary | ICD-10-CM

## 2024-09-04 ENCOUNTER — Other Ambulatory Visit: Payer: Self-pay

## 2024-09-04 ENCOUNTER — Encounter

## 2024-09-04 ENCOUNTER — Other Ambulatory Visit

## 2024-09-04 DIAGNOSIS — M7989 Other specified soft tissue disorders: Secondary | ICD-10-CM

## 2024-09-04 NOTE — Progress Notes (Unsigned)
 Cardiology Office Note    Date:  09/05/2024  ID:  Summer [name], DOB [birth year], MRN [id number] Cardiologist: Previously Dr. Okey in 2023 - Followed by [facility] in the interim but wishes to switch back to Dr. Alvan  History of Present Illness:    Summer [name] is a 63 y.o. female with past medical history of chronic chest pain (prior Coronary CTA in 06/2017 showing no evidence of CAD), HTN, HLD, Hypothyroidism and anxiety who presents to the office today for evaluation of chest pain.   She was last examined by Dr. Okey in 03/2022 and was planning to undergo a sleep study. Did report episodic chest pain at times. No changes were made to her cardiac medications and risk factor modification was recommended.  In the interim, she has been followed by [facility] with most recent office visit being in 05/2024. She did report occasional palpitations and prior Zio patch had shown brief SVT but no significant arrhythmias. She also reported having dyspnea on exertion which was felt to be multifactorial in the setting of known obstructive and restrictive lung disease, morbid obesity and deconditioning. Likelihood of obstructive CAD was felt to be low and further cardiac testing was not pursued. She did have a Zio patch arranged by her PCP in 06/2024 which showed predominantly normal sinus rhythm with 5 beats SVT and rare PAC's and PVC's but less than 1% burden.  In talking with the patient today, she reports having episodic chest pain for the past few years. Symptoms can occur at rest or with activity. Pain can last from several minutes up to several hours. She has baseline dyspnea on exertion and is unsure if symptoms are due to a pulmonary or cardiac etiology. Reports having mast cell activation syndrome which is followed by Allergy and Immunology. She also reports having frequent palpitations which are typically most notable with activity. Recently wore a monitor which showed predominantly  normal sinus rhythm with only brief SVT and rare PAC's and PVC's. She does feel like symptoms have progressed since having COVID. She does not consume caffeine and denies any alcohol intake. Reports occasional lower extremity edema and takes Lasix  as needed. No specific orthopnea or PND.  Studies Reviewed:   EKG: EKG is not ordered today. EKG from 07/17/2024 is reviewed which shows normal sinus rhythm, heart rate 83 with no acute ST changes.  Coronary CTA: 07/2017 IMPRESSION: 1. Coronary calcium  score of 0. This was 0 percentile for age and sex matched control.   2. Normal coronary origin with right dominance.   3. This study is affected significantly by artifact - poor contrast opacification and cardiac motion. However there is no obvious CAD.   Echocardiogram: 01/2023 Summary   1. The left ventricle is normal in size with normal wall thickness.    2. The left ventricular systolic function is normal, LVEF is visually  estimated at 60-65%.    3. There is grade I diastolic dysfunction (impaired relaxation).    4. There is mild mitral valve regurgitation.    5. The left atrium is mildly dilated in size.    6. The right ventricle is normal in size, with normal systolic function.    7. There is no pulmonary hypertension.   Cardiac Monitor: 05/2024  1.NSR (63/min) and sinus tachy (112/min) ave 82/min.  2. One 5 beat NS SVT at 117/min 3. Rare PVC's and PAC's (<1%) 4. Triggered events due to NSR and noise artifact. 5. No VT, atrial fib or  sustained SVT 6. No prolonged pauses Patch Wear Time:  10 days and 12 hours (2025-07-19T22:29:17-0400 to 2025-07-30T11:09:20-0400)   Physical Exam:   VS:  BP 132/70 (BP Location: Right Wrist, Cuff Size: Normal)   Pulse 79   Ht 5' 4 (1.626 m)   Wt (!) 315 lb 9.6 oz (143.2 kg)   LMP 01/23/2014 Comment: spotting  SpO2 99%   BMI 54.17 kg/m    Wt Readings from Last 3 Encounters:  09/05/24 (!) 315 lb 9.6 oz (143.2 kg)  08/24/24 (!) 313 lb 1.6  oz (142 kg)  08/01/24 (!) 314 lb (142.4 kg)     GEN: Pleasant female appearing in no acute distress NECK: JVD unable to be assessed; No carotid bruits CARDIAC: RRR, no murmurs, rubs, gallops RESPIRATORY:  Clear to auscultation without rales, wheezing or rhonchi  ABDOMEN: Appears non-distended. No obvious abdominal masses. EXTREMITIES: No clubbing or cyanosis. No pitting edema.  Distal pedal pulses are 2+ bilaterally.   Assessment and Plan:   1. Chest pain, unspecified type - She has a longstanding history of chest pain but is concerned about a cardiac etiology as she has baseline chest pain and dyspnea on exertion and it is difficult for her to discern which symptoms are due to her pulmonary issues/mast cell activation syndrome or a potential cardiac cause.  - Her body habitus limits the utility of a Lexiscan Myoview and given atypical features of her symptoms (pain occurring at rest or with activity and lasting hours at a time), would not proceed directly to a cardiac catheterization. Coronary CTA also not ideal given her body habitus but was previously performed in 2018 and weight was around 280 lbs then. Will plan for a follow-up Coronary CTA for reassessment. If unable to be performed, can consider Cardiac PET. Continue with risk factor modification.   2. Palpitations - Recent monitor showed predominantly normal sinus rhythm with 1 beat of SVT and rare PAC's and PVC's but less than 1% burden. Reviewed options with the patient and she does report symptomatic palpitations frequently and heart rate will increase into the 130's to 140's with activity per her report. Was initially going to prescribe Bisoprolol in favor of Metoprolol  or Coreg given her underlying pulmonary issues but will check with Dr. Iva given her mast cell activation syndrome. If unable to use beta-blockers, could try short-acting Cardizem 30 mg twice daily.  3.  History of HTN - BP is at 132/70 during today's visit.  She is not currently on antihypertensive therapy. Only takes Lasix  as needed. Continue to follow.  4.  HLD - Followed by PCP. Remains on Crestor  5 mg daily.  Disposition: Follow-up will be determined based off test results.   Signed, Laymon CHRISTELLA Qua, PA-C

## 2024-09-05 ENCOUNTER — Ambulatory Visit: Admitting: Student

## 2024-09-05 ENCOUNTER — Ambulatory Visit: Attending: Student | Admitting: Student

## 2024-09-05 ENCOUNTER — Encounter: Payer: Self-pay | Admitting: Student

## 2024-09-05 VITALS — BP 132/70 | HR 79 | Ht 64.0 in | Wt 315.6 lb

## 2024-09-05 DIAGNOSIS — E785 Hyperlipidemia, unspecified: Secondary | ICD-10-CM | POA: Diagnosis not present

## 2024-09-05 DIAGNOSIS — Z8679 Personal history of other diseases of the circulatory system: Secondary | ICD-10-CM | POA: Insufficient documentation

## 2024-09-05 DIAGNOSIS — R079 Chest pain, unspecified: Secondary | ICD-10-CM | POA: Insufficient documentation

## 2024-09-05 DIAGNOSIS — R002 Palpitations: Secondary | ICD-10-CM | POA: Diagnosis not present

## 2024-09-05 MED ORDER — METOPROLOL TARTRATE 100 MG PO TABS: 100.0000 mg | ORAL_TABLET | ORAL | 0 refills | Status: AC

## 2024-09-05 MED ORDER — BISOPROLOL FUMARATE 2.5 MG PO TABS: 2.5000 mg | ORAL_TABLET | Freq: Every day | ORAL | 3 refills | Status: DC

## 2024-09-05 NOTE — Patient Instructions (Addendum)
 Medication Instructions:   Take Lopressor  100 mg two hours prior to Cardiac CT scan   *If you need a refill on your cardiac medications before your next appointment, please call your pharmacy*  Lab Work:  None   If you have labs (blood work) drawn today and your tests are completely normal, you will receive your results only by: MyChart Message (if you have MyChart) OR A paper copy in the mail If you have any lab test that is abnormal or we need to change your treatment, we will call you to review the results.  Testing/Procedures:   Your cardiac CT will be scheduled at one of the below locations:   [facility] [address] [place], [place] [address] [phone] (Severe contrast allergies only)  OR   [facility] [address] [place], [place] [address] [phone]  OR   [facility] [address] [place], [place] [address] [phone]  OR   [facility] [address]  [place], [place] [address]  If scheduled at [facility], please arrive at the [facility] (Entrance C2) of [facility] 30 minutes prior to test start time. You can use the FREE valet parking offered at entrance C (encouraged to control the heart rate for the test)  Proceed to the [facility] Radiology Department (first floor) to check-in and test prep.  All radiology patients and guests should use entrance C2 at [facility], accessed from [facility], even though the hospital's physical address listed is [address]  If scheduled at the Heart and Vascular Tower at [address], please enter the parking lot using the [address] entrance and use the FREE valet service at the patient drop-off area. Enter the building and check-in with registration on the main floor.  If scheduled at [facility], please arrive to the Heart  and Vascular Center 15 mins early for check-in and test prep.  There is spacious parking and easy access to the radiology department from the [facility] Heart and Vascular entrance. Please enter here and check-in with the desk attendant.   If scheduled at [facility], please arrive 30 minutes early for check-in and test prep.  Please follow these instructions carefully (unless otherwise directed):  An IV will be required for this test and Nitroglycerin  will be given.  Hold all erectile dysfunction medications at least 3 days (72 hrs) prior to test. (Ie viagra, cialis, sildenafil, tadalafil, etc)   On the Night Before the Test: Be sure to Drink plenty of water . Do not consume any caffeinated/decaffeinated beverages or chocolate 12 hours prior to your test. Do not take any antihistamines 12 hours prior to your test.  If the patient has contrast allergy: Patient will need a prescription for Prednisone  and very clear instructions (as follows): Prednisone  50 mg - take 13 hours prior to test Take another Prednisone  50 mg 7 hours prior to test Take another Prednisone  50 mg 1 hour prior to test Take Benadryl  50 mg 1 hour prior to test Patient must complete all four doses of above prophylactic medications. Patient will need a ride after test due to Benadryl .  On the Day of the Test: Drink plenty of water  until 1 hour prior to the test. Do not eat any food 1 hour prior to test. You may take your regular medications prior to the test.  Take metoprolol  (Lopressor ) two  hours prior to test. If you take Furosemide /Hydrochlorothiazide /Spironolactone/Chlorthalidone, please HOLD on the morning of the test. Patients who wear a continuous glucose monitor MUST remove the device prior to scanning. FEMALES- please wear underwire-free bra if available, avoid dresses & tight clothing  After the Test: Drink plenty of water . After receiving IV contrast, you may experience a mild flushed feeling. This is  normal. On occasion, you may experience a mild rash up to 24 hours after the test. This is not dangerous. If this occurs, you can take Benadryl  25 mg, Zyrtec , Claritin, or Allegra and increase your fluid intake. (Patients taking Tikosyn should avoid Benadryl , and may take Zyrtec , Claritin, or Allegra) If you experience trouble breathing, this can be serious. If it is severe call 911 IMMEDIATELY. If it is mild, please call our office.  We will call to schedule your test 2-4 weeks out understanding that some insurance companies will need an authorization prior to the service being performed.   For more information and frequently asked questions, please visit our website : [email or web address]  For non-scheduling related questions, please contact the cardiac imaging nurse navigator should you have any questions/concerns: Cardiac Imaging Nurse Navigators Direct Office Dial: [phone]   For scheduling needs, including cancellations and rescheduling, please call Brittany, [phone].   Follow-Up: At [facility], you and your health needs are our priority.  As part of our continuing mission to provide you with exceptional heart care, our providers are all part of one team.  This team includes your primary Cardiologist (physician) and Advanced Practice Providers or APPs (Physician Assistants and Nurse Practitioners) who all work together to provide you with the care you need, when you need it.  Your next appointment:    Pending Test Results   Provider:   Laymon Qua, PA-C    We recommend signing up for the patient portal called MyChart.  Sign up information is provided on this After Visit Summary.  MyChart is used to connect with patients for Virtual Visits (Telemedicine).  Patients are able to view lab/test results, encounter notes, upcoming appointments, etc.  Non-urgent messages can be sent to your provider as well.   To learn more about what you can do with  MyChart, go to [email or web address].   Other Instructions Thank you for choosing [facility]!

## 2024-09-07 ENCOUNTER — Telehealth: Payer: Self-pay | Admitting: Student

## 2024-09-07 NOTE — Telephone Encounter (Signed)
   Please let the patient know I reviewed everything with Dr. Iva and he is fine with us  starting a beta-blocker to see if this helps with her palpitations. Would recommend low-dose Bisoprolol 2.5mg  daily initially and can titrate if needed. On the day of her Coronary CTA, she would HOLD Bisoprolol and take Lopressor  as previously prescribed.   Signed, Laymon CHRISTELLA Qua, PA-C 09/07/2024, 11:03 AM Pager: [phone]

## 2024-09-10 ENCOUNTER — Other Ambulatory Visit: Payer: Self-pay | Admitting: Allergy & Immunology

## 2024-09-11 MED ORDER — BISOPROLOL FUMARATE 2.5 MG PO TABS: 2.5000 mg | ORAL_TABLET | Freq: Every day | ORAL | 3 refills | Status: AC

## 2024-09-11 NOTE — Telephone Encounter (Signed)
 Pt notified of B. Strader's response. Pt agrees to take Bisoprolol 2.5mg  daily.

## 2024-09-12 ENCOUNTER — Ambulatory Visit
Admission: RE | Admit: 2024-09-12 | Discharge: 2024-09-12 | Disposition: A | Discharge status: Home / Self Care | Source: Ambulatory Visit | Attending: Oncology | Admitting: Oncology

## 2024-09-12 DIAGNOSIS — N644 Mastodynia: Secondary | ICD-10-CM | POA: Diagnosis not present

## 2024-09-12 DIAGNOSIS — R928 Other abnormal and inconclusive findings on diagnostic imaging of breast: Secondary | ICD-10-CM | POA: Diagnosis not present

## 2024-09-12 DIAGNOSIS — I890 Lymphedema, not elsewhere classified: Secondary | ICD-10-CM

## 2024-09-13 ENCOUNTER — Ambulatory Visit: Payer: Self-pay | Admitting: Family Medicine

## 2024-09-14 ENCOUNTER — Encounter: Payer: Self-pay | Admitting: Allergy & Immunology

## 2024-09-14 ENCOUNTER — Ambulatory Visit (INDEPENDENT_AMBULATORY_CARE_PROVIDER_SITE_OTHER): Admitting: Allergy & Immunology

## 2024-09-14 VITALS — BP 136/84 | HR 80 | Temp 98.7°F | Wt 312.6 lb

## 2024-09-14 DIAGNOSIS — J3089 Other allergic rhinitis: Secondary | ICD-10-CM | POA: Diagnosis not present

## 2024-09-14 DIAGNOSIS — K219 Gastro-esophageal reflux disease without esophagitis: Secondary | ICD-10-CM

## 2024-09-14 DIAGNOSIS — F419 Anxiety disorder, unspecified: Secondary | ICD-10-CM

## 2024-09-14 DIAGNOSIS — D8940 Mast cell activation, unspecified: Secondary | ICD-10-CM | POA: Diagnosis not present

## 2024-09-14 DIAGNOSIS — J4540 Moderate persistent asthma, uncomplicated: Secondary | ICD-10-CM | POA: Diagnosis not present

## 2024-09-14 DIAGNOSIS — L508 Other urticaria: Secondary | ICD-10-CM

## 2024-09-14 NOTE — Patient Instructions (Addendum)
 1. Perennial allergic rhinitis - indoor molds - Continue with: Pepcid  (famotidine ) 40mg  twice daily and the antihistamine regimen as discussed.  - You can use an extra dose of the antihistamine, if needed, for breakthrough symptoms.   2. Allergic reaction - likely mast cell activation syndrome (although labs have all been normal, checking more now) - Continue with MORNING: Zyrtec  (cetirizine ) 20mg  tablet in the morning and and Pepcid  (famotidine ) 40mg   - Continue with EVENING: Xyzal  (levocetirizine) 5mg  at night and Pepcid  (famotidine ) 40mg  - Continue with: cromolyn  5mL 2-4 times daily - Continue with: hydroxyzine  25mg  to 50mg  at night - Labs ordered, including urine tests, to continue this diagnostic journey. - You are always a LEARNING OPPORTUNITY for all of us ! :-)  - Go to the lab and do the blood work and the urine tests. - Try the LOW DOSE naltrexone  (cut in half) to see how that works.  - I think your current pills that you paid for should still be effective.  - Send me an update via Mychart or give us  a call.  - Information provided on Rhapsido, a recently approved medication for hives (urticaria). - This is a twice daily tablet, so it would be cleared more quickly than Xolair.  - Samples of this provided to see if it does anything any all.  - Try this first before the low dose naltrexone  to see if this helps at all.   3. Shortness of breath - Lung testing looks a bit lower today.  - Daily controller medication(s): Symbicort  80/4.68mcg two puffs two times daily with spacer and cromolyn  nebulizer twice daily as you are doing. - Prior to physical activity: albuterol  2 puffs 10-15 minutes before physical activity. - Rescue medications: albuterol  4 puffs every 4-6 hours as needed and albuterol  nebulizer one vial every 4-6 hours as needed - Asthma control goals:  * Full participation in all desired activities (may need albuterol  before activity) * Albuterol  use two time or less a week on  average (not counting use with activity) * Cough interfering with sleep two time or less a month * Oral steroids no more than once a year * No hospitalizations  4. Hashimoto's thyroiditis  - Continue to follow with your PCP.  - I will send my note to Dr. Alphonsa.   5. Bilateral flank pain - We ordered a renal ultrasound to looks at the kidneys.  - We will call Zelda Salmon to follow up on this.  - We are ordering imaging for you at a [facility] facility. - We will check to see if a PA is needed, but go ahead and call to schedule the imaging study. - The phone number for [facility] Scheduling is [phone] (option #3).   6. Return in about 3 months (around 12/15/2024). You can have the follow up appointment with Dr. Iva or a Nurse Practicioner (our Nurse Practitioners are excellent and always have Physician oversight!).    Please inform us  of any Emergency Department visits, hospitalizations, or changes in symptoms. Call us  before going to the ED for breathing or allergy symptoms since we might be able to fit you in for a sick visit. Feel free to contact us  anytime with any questions, problems, or concerns.  It was a pleasure to see you again today!  Websites that have reliable patient information: 1. American Academy of Asthma, Allergy, and Immunology: www.aaaai.org 2. Food Allergy Research and Education (FARE): foodallergy.org 3. Mothers of Asthmatics: http://www.asthmacommunitynetwork.org 4. [employer] of Allergy, Asthma,  and Immunology: www.acaai.org      "Like" us  on Facebook and Instagram for our latest updates!      A healthy democracy works best when [employer] participate! Make sure you are registered to vote! If you have moved or changed any of your contact information, you will need to get this updated before voting! Scan the QR codes below to learn more!

## 2024-09-14 NOTE — Progress Notes (Signed)
 FOLLOW UP  Date of Service/Encounter:  09/14/24   Assessment:   Perennial allergic rhinitis (indoor molds)   Allergic reactions - unknown trigger (possible mast cell activation syndrome, although tryptase has never been elevated)   Chronic urticaria - improved with daily antihistamines and montelukast    Shortness of breath - with slowly improving spirometry tracings    Anxiety - has Xanas as needed, although is not clear that this is working   Fatigue - likely secondary to obstructive sleep apnea (has not been started on CPAP yet (followed by Dr. Jude)   Hypothyroidism - with elevated anti-TPO antibodies    Plan/Recommendations:   1. Perennial allergic rhinitis - indoor molds - Continue with: Pepcid  (famotidine ) 40mg  twice daily and the antihistamine regimen as discussed.  - You can use an extra dose of the antihistamine, if needed, for breakthrough symptoms.   2. Allergic reaction - likely mast cell activation syndrome (although labs have all been normal, checking more now) - Continue with MORNING: Zyrtec  (cetirizine ) 20mg  tablet in the morning and and Pepcid  (famotidine ) 40mg   - Continue with EVENING: Xyzal  (levocetirizine) 5mg  at night and Pepcid  (famotidine ) 40mg  - Continue with: cromolyn  5mL 2-4 times daily - Continue with: hydroxyzine  25mg  to 50mg  at night - Labs ordered, including urine tests, to continue this diagnostic journey. - You are always a LEARNING OPPORTUNITY for all of us ! :-)  - Go to the lab and do the blood work and the urine tests. - Try the LOW DOSE naltrexone  (cut in half) to see how that works.  - I think your current pills that you paid for should still be effective.  - Send me an update via Mychart or give us  a call.  - Information provided on Rhapsido, a recently approved medication for hives (urticaria). - This is a twice daily tablet, so it would be cleared more quickly than Xolair.  - Samples of this provided to see if it does anything any  all.  - Try this first before the low dose naltrexone  to see if this helps at all.   3. Shortness of breath - Lung testing looks a bit lower today.  - Daily controller medication(s): Symbicort  80/4.79mcg two puffs two times daily with spacer and cromolyn  nebulizer twice daily as you are doing. - Prior to physical activity: albuterol  2 puffs 10-15 minutes before physical activity. - Rescue medications: albuterol  4 puffs every 4-6 hours as needed and albuterol  nebulizer one vial every 4-6 hours as needed - Asthma control goals:  * Full participation in all desired activities (may need albuterol  before activity) * Albuterol  use two time or less a week on average (not counting use with activity) * Cough interfering with sleep two time or less a month * Oral steroids no more than once a year * No hospitalizations  4. Hashimoto's thyroiditis  - Continue to follow with your PCP.  - I will send my note to Dr. Alphonsa.   5. Bilateral flank pain - We ordered a renal ultrasound to looks at the kidneys.  - We will call Zelda Salmon to follow up on this.  - We are ordering imaging for you at a [facility] facility. - We will check to see if a PA is needed, but go ahead and call to schedule the imaging study. - The phone number for [facility] Scheduling is [phone] (option #3).   6. Return in about 3 months (around 12/15/2024). You can have the follow up appointment with Dr. Iva or  a Nurse Practitioner (our Nurse Practitioners are excellent and always have Physician oversight!).   Total of 60 minutes, greater than 50% of which was spent in discussion of treatment and management options.    Subjective:   Summer [name] is a 63 y.o. female presenting today for follow up of  Chief Complaint  Patient presents with   Follow-up    Pt c/o SOB overall not doing well. She said she's not sure if its mast cell or asthma or both.    Summer [name] has a history of the following: Patient Active  Problem List   Diagnosis Date Noted   Left upper extremity swelling 07/18/2024   Chronic sinusitis 10/18/2023   Diastolic CHF, chronic (HCC) 08/03/2023   Thickened endometrium 03/22/2023   Nonrheumatic mitral valve regurgitation 03/15/2023   OSA (obstructive sleep apnea) 03/07/2023   Moderate persistent asthma 03/07/2023   Gallbladder polyp 02/22/2023   Mast cell activation syndrome 01/14/2023   Fatty liver 09/20/2022   Carpal tunnel syndrome 05/18/2022   Spinal stenosis 05/18/2022   Constipation 04/06/2022   DOE (dyspnea on exertion) 01/18/2022   Emphysema lung (HCC) 01/15/2022   Hyperlipidemia 01/15/2022   Elevated hemoglobin 01/15/2022   Morbid obesity (HCC) 05/28/2021   Postmenopausal bleeding 08/10/2018   Prediabetes 06/10/2014   Hypothyroidism 06/10/2014   Gastroesophageal reflux disease 04/01/2013   Fibromyalgia 03/22/2013    History obtained from: chart review and patient.  Discussed the use of AI scribe software for clinical note transcription with the patient and/or guardian, who gave verbal consent to proceed.  Summer [name] is a 63 y.o. female presenting for a follow up visit.  He was last seen in May 2025.  At that time, we continue with Pepcid  twice a day for her allergic rhinitis.  For her allergic reactions, we continue with Zyrtec  and Pepcid  in the morning and Xyzal  and Pepcid  at night as well as cromolyn  5 mL 2-4 times a day and hydroxyzine  25 to 50 mg at night.  We ordered a bunch of labs to finish the workup for [facility].  For her shortness of breath, like testing looked good lower.  We continue Symbicort  2 puffs 1-2 times daily as well as her cromolyn  nebulizer twice daily.  We have talked about Xolair in the past, but she has refused it.  She had bilateral flank pain so we ordered a renal ultrasound. It does not look like this has been done.  We also ordered a number of labs including.  Labs and some urine studies, which were all uncollected at this point. She had a  balance with [facility] which she has not paid; she is planning to pay this off once her husband gets his back pay g  Since last visit, she continues to have a slew of problems.   She experiences episodes of tachycardia with heart rates reaching 120-130 bpm upon standing or moving, and reports that her heart doctor said POTS is a possibility, but other causes are being ruled out. She is hesitant to take a 100 mg beta blocker due to concerns about her heart rate dropping too low, but is open to trying a lower dose of 5 mg during episodes of tachycardia.  She reports worsening symptoms related to mast cell activation, with reactions to various stimuli, including food, causing symptoms such as skin burning, shortness of breath, heart palpitations, and gastrointestinal issues. She is managing her condition with a low histamine diet, but finds it challenging. Medications like cromolyn  and low dose  naltrexone  (LDN) have caused adverse effects such as flu-like symptoms and heart palpitations. She is open to trying this again. She has some LDN that she purchased from a compounding pharmacy at some point, but she reacted to it. She is open to trying a half dose to see if this helps.   She has difficulty with heart monitoring due to skin reactions to adhesive tapes, resulting in blisters. Attempts to use different tapes have been unsuccessful, and a recent monitor malfunctioned, requiring a repeat test which she is reluctant to undergo due to skin reactions.  She has a history of unexplained swelling in her arm, which has been investigated with ultrasounds and a CT angiogram; she reports being told that no clots or masses were found. She has been referred for an open MRI due to claustrophobia, and degenerative changes were noted in her neck.  She is experiencing financial difficulties, impacting her ability to pay for certain tests and medications. She and her partner are awaiting back pay from his disability claim,  which has been delayed due to a government shutdown. This financial strain has affected her ability to pay off a bill with [facility], delaying necessary lab work.     Asthma/Respiratory Symptom History: She reports breathing difficulties, particularly when exerting herself, such as walking to the dumpster. She uses Symbicort  (two puffs twice a day) and occasionally albuterol , which sometimes helps. She has experienced episodes of feeling faint and short of breath, with her heart rate increasing unexpectedly.  Allergic Rhinitis Symptom History: She remains on her antihistamines. She is currently doing cetirizine  20mg  and Pecid 40mg  in the morning combined with Xyzal  and Pepcid  in the evening. She is on cromolyn  before meals as well as hydroxyzine  25-50mg  at night. This is all to control her allergic reactions, but it definitely helps with her allergic rhinitis as well.   She is reporting bilateral flank pain again today. She never got the ultrasound performed. She tells me that the imaging scheduling never called her.   Food Allergy Symptom History: She reports that she reacts to everything she eats. It has led to a tremendous amount of anxiety for her.   Skin Symptom History: The antihistamine regimen is helping to decrease her reactions, but she continues to have some breakthrough hives and itching. She is nervous about trying Xolair because she does not want to react to it and then have to wait weeks for the drug to clear from the body. She is open to trying any new pills - we discussed Rhapsido during the visit and the side effect profile. She would like to give this a try.   Otherwise, there have been no changes to her past medical history, surgical history, family history, or social history.    Review of systems otherwise negative other than that mentioned in the HPI.    Objective:   Blood pressure 136/84, pulse 80, temperature 98.7 F (37.1 C), temperature source Temporal, weight (!) 312  lb 9.6 oz (141.8 kg), last menstrual period 01/23/2014, SpO2 100%. Body mass index is 53.66 kg/m.    Physical Exam Vitals reviewed.  Constitutional:      Appearance: She is well-developed. She is obese.     Comments: Anxious which is her baseline.  Pan positive review of systems.  HENT:     Head: Normocephalic and atraumatic.     Right Ear: Tympanic membrane, ear canal and external ear normal. No drainage, swelling or tenderness. Tympanic membrane is not injected, scarred, erythematous, retracted or  bulging.     Left Ear: Tympanic membrane, ear canal and external ear normal. No drainage, swelling or tenderness. Tympanic membrane is not injected, scarred, erythematous, retracted or bulging.     Nose: Mucosal edema and rhinorrhea present. No nasal deformity or septal deviation.     Right Nostril: No epistaxis.     Left Nostril: No epistaxis.     Right Turbinates: Enlarged, swollen and pale.     Left Turbinates: Enlarged, swollen and pale.     Right Sinus: No maxillary sinus tenderness or frontal sinus tenderness.     Left Sinus: No maxillary sinus tenderness or frontal sinus tenderness.     Comments: No polyps noted.     Mouth/Throat:     Lips: Pink.     Mouth: Mucous membranes are moist. Mucous membranes are not pale and not dry.     Pharynx: Uvula midline.     Comments: Mild cobblestoning.  Eyes:     General: Lids are normal. Allergic shiner present.        Right eye: No discharge.        Left eye: No discharge.     Conjunctiva/sclera: Conjunctivae normal.     Right eye: Right conjunctiva is not injected. No chemosis.    Left eye: Left conjunctiva is not injected. No chemosis.    Pupils: Pupils are equal, round, and reactive to light.  Cardiovascular:     Rate and Rhythm: Normal rate and regular rhythm.     Heart sounds: Normal heart sounds.  Pulmonary:     Effort: Pulmonary effort is normal. No tachypnea, accessory muscle usage or respiratory distress.     Breath sounds:  Normal breath sounds. Decreased air movement present. No wheezing, rhonchi or rales.     Comments: Decreased air movement at the bases likely due to body habitus.  No wheezing or crackles.  Speaking in full sentences. Chest:     Chest wall: No tenderness.  Abdominal:     Tenderness: There is no abdominal tenderness. There is no guarding or rebound.  Lymphadenopathy:     Head:     Right side of head: No submandibular, tonsillar or occipital adenopathy.     Left side of head: No submandibular, tonsillar or occipital adenopathy.     Cervical: No cervical adenopathy.  Skin:    General: Skin is warm.     Capillary Refill: Capillary refill takes less than 2 seconds.     Coloration: Skin is not pale.     Findings: No abrasion, erythema, petechiae or rash. Rash is not papular, urticarial or vesicular.     Comments: No flushing noted.  Neurological:     Mental Status: She is alert.  Psychiatric:        Behavior: Behavior is cooperative.      Diagnostic studies:    Spirometry: results normal (FEV1: 1.68/71%, FVC: 2.06/68%, FEV1/FVC: 82%).    Spirometry consistent with normal pattern.   Allergy Studies: recommended that she get the labs that I ordered earlier in the year      Marty Shaggy, MD  [facility] 

## 2024-09-17 ENCOUNTER — Ambulatory Visit: Admitting: Family Medicine

## 2024-09-18 ENCOUNTER — Ambulatory Visit (INDEPENDENT_AMBULATORY_CARE_PROVIDER_SITE_OTHER): Admitting: Family Medicine

## 2024-09-18 ENCOUNTER — Other Ambulatory Visit: Payer: Self-pay | Admitting: Family Medicine

## 2024-09-18 ENCOUNTER — Other Ambulatory Visit: Payer: Self-pay

## 2024-09-18 ENCOUNTER — Encounter: Payer: Self-pay | Admitting: Family Medicine

## 2024-09-18 VITALS — BP 140/79 | HR 73 | Temp 98.2°F | Ht 64.0 in | Wt 315.0 lb

## 2024-09-18 DIAGNOSIS — R29898 Other symptoms and signs involving the musculoskeletal system: Secondary | ICD-10-CM | POA: Diagnosis not present

## 2024-09-18 DIAGNOSIS — Z79899 Other long term (current) drug therapy: Secondary | ICD-10-CM

## 2024-09-18 DIAGNOSIS — R739 Hyperglycemia, unspecified: Secondary | ICD-10-CM

## 2024-09-18 DIAGNOSIS — M542 Cervicalgia: Secondary | ICD-10-CM | POA: Diagnosis not present

## 2024-09-18 DIAGNOSIS — E039 Hypothyroidism, unspecified: Secondary | ICD-10-CM

## 2024-09-18 DIAGNOSIS — M7989 Other specified soft tissue disorders: Secondary | ICD-10-CM | POA: Diagnosis not present

## 2024-09-18 DIAGNOSIS — E785 Hyperlipidemia, unspecified: Secondary | ICD-10-CM

## 2024-09-18 NOTE — Progress Notes (Signed)
   Subjective:    Patient ID: Summer [name], female    DOB: 1961/03/04, 63 y.o.   MRN: [id number] Here for f/u. Still having L arm pain. HPI Patient with ongoing neck pain as well as left arm weakness and left arm upper arm swelling.  Had a mammogram with ultrasound was negative for breast cancer or axillary lymph nodes She states the left arm causes her pain and discomfort in the midshaft region and has had a x-ray and ultrasound which have been unrevealing in the past She does have significant pain in her neck radiates down the arm She has not tried the Lyrica  I have encouraged her to try it we did discuss side effects she would let us  know if any problems    Review of Systems     Objective:   Physical Exam The left midshaft is 1 inch bigger than the right side Lungs are clear respiratory rate is normal Heart regular Cervical spine subjective discomfort        Assessment & Plan:  Cervical impingement recommend MRI this was tried and in regards to being ordered previously but for some reason never got completed.  Needs MRI at open MRI machine in [facility] patient is short and very obese therefore a standard MRI machine would not work well for her  Left humerus area swollen as well as weakness in the arm physical medicine referral had been done previously but for some reason it was never scheduled up we will put this through the system again Also recommend Lyrica  she has had this prescribed to her she has it at home she just never tried it if she has any side effects she will let us  know

## 2024-09-21 ENCOUNTER — Ambulatory Visit: Admitting: Allergy & Immunology

## 2024-09-21 ENCOUNTER — Telehealth: Payer: Self-pay | Admitting: *Deleted

## 2024-09-21 NOTE — Telephone Encounter (Signed)
-----   Message from [name] sent at 09/21/2024  8:11 AM EST ----- Can someone call to see how Summer [name] is working for her?

## 2024-09-23 ENCOUNTER — Encounter (HOSPITAL_COMMUNITY): Payer: Self-pay | Admitting: Emergency Medicine

## 2024-09-23 ENCOUNTER — Other Ambulatory Visit: Payer: Self-pay

## 2024-09-23 ENCOUNTER — Emergency Department (HOSPITAL_COMMUNITY)
Admission: EM | Admit: 2024-09-23 | Discharge: 2024-09-24 | Disposition: A | Attending: Emergency Medicine | Admitting: Emergency Medicine

## 2024-09-23 DIAGNOSIS — Z87891 Personal history of nicotine dependence: Secondary | ICD-10-CM | POA: Diagnosis not present

## 2024-09-23 DIAGNOSIS — I10 Essential (primary) hypertension: Secondary | ICD-10-CM | POA: Insufficient documentation

## 2024-09-23 DIAGNOSIS — J069 Acute upper respiratory infection, unspecified: Secondary | ICD-10-CM | POA: Insufficient documentation

## 2024-09-23 DIAGNOSIS — R058 Other specified cough: Secondary | ICD-10-CM | POA: Diagnosis present

## 2024-09-23 LAB — RESP PANEL BY RT-PCR (RSV, FLU A&B, COVID)  RVPGX2
Influenza A by PCR: NEGATIVE
Influenza B by PCR: NEGATIVE
Resp Syncytial Virus by PCR: NEGATIVE
SARS Coronavirus 2 by RT PCR: NEGATIVE

## 2024-09-23 LAB — GROUP A STREP BY PCR: Group A Strep by PCR: NOT DETECTED

## 2024-09-23 NOTE — ED Triage Notes (Signed)
 Patient endorses sore throat, cough, congestion, facial pain and low grade fevers since Friday.  Patient's lungs clear and equal bilaterally.

## 2024-09-24 ENCOUNTER — Ambulatory Visit: Admitting: Family Medicine

## 2024-09-24 VITALS — BP 152/86 | HR 100 | Temp 98.4°F | Ht 64.0 in | Wt 314.0 lb

## 2024-09-24 DIAGNOSIS — J441 Chronic obstructive pulmonary disease with (acute) exacerbation: Secondary | ICD-10-CM

## 2024-09-24 MED ORDER — AZITHROMYCIN 250 MG PO TABS: ORAL_TABLET | ORAL | 0 refills | Status: AC

## 2024-09-24 MED ORDER — FLUTICASONE PROPIONATE 50 MCG/ACT NA SUSP: 2.0000 | Freq: Every day | NASAL | 0 refills | Status: AC

## 2024-09-24 MED ORDER — BENZONATATE 100 MG PO CAPS: 100.0000 mg | ORAL_CAPSULE | Freq: Three times a day (TID) | ORAL | 0 refills | Status: DC

## 2024-09-24 NOTE — Discharge Instructions (Signed)
 You were evaluated in the Emergency Department and after careful evaluation, we did not find any emergent condition requiring admission or further testing in the hospital.  Your exam/testing today is overall reassuring.  Symptoms likely due to a viral illness causing sinusitis and bronchitis.  Continue your home inhalers, continue Tylenol  and Motrin  for discomfort or fever.  Can use the Flonase  for nasal congestion, can use the Tessalon  to suppress your cough.  Plenty of fluids and rest.  Please return to the Emergency Department if you experience any worsening of your condition.   Thank you for allowing us  to be a part of your care.

## 2024-09-24 NOTE — Progress Notes (Signed)
 Subjective:  Patient ID: Summer [name], female    DOB: [birth year]  Age: 63 y.o. MRN: [id number]  CC:   Chief Complaint  Patient presents with   respiratory illness ER follow up     HPI:  63 year old female with known asthma and COPD presents with respiratory symptoms.  Has been sick since Friday.  She reports sore throat, productive cough, congestion.  Subjective fever.  She states that she had vomiting yesterday.  Was seen in the ER last night and had a negative workup.  Was given Tessalon  Perles for cough.  Patient presents today with persistent symptoms.  Her husband is also sick.  She feels very poorly.  Patient Active Problem List   Diagnosis Date Noted   COPD exacerbation (HCC) 09/24/2024   Left upper extremity swelling 07/18/2024   Chronic sinusitis 10/18/2023   Diastolic CHF, chronic (HCC) 08/03/2023   Thickened endometrium 03/22/2023   Nonrheumatic mitral valve regurgitation 03/15/2023   OSA (obstructive sleep apnea) 03/07/2023   Moderate persistent asthma 03/07/2023   Gallbladder polyp 02/22/2023   Aortic atherosclerosis 02/02/2023   Mast cell activation syndrome 01/14/2023   Fatty liver 09/20/2022   Carpal tunnel syndrome 05/18/2022   Spinal stenosis 05/18/2022   Constipation 04/06/2022   DOE (dyspnea on exertion) 01/18/2022   Emphysema lung (HCC) 01/15/2022   Hyperlipidemia 01/15/2022   Elevated hemoglobin 01/15/2022   Morbid obesity (HCC) 05/28/2021   Postmenopausal bleeding 08/10/2018   Prediabetes 06/10/2014   Hypothyroidism 06/10/2014   Gastroesophageal reflux disease 04/01/2013   Fibromyalgia 03/22/2013    Social Hx   Social History   Socioeconomic History   Marital status: Married    Spouse name: Not on file   Number of children: Not on file   Years of education: Not on file   Highest education level: 12th grade  Occupational History   Not on file  Tobacco Use   Smoking status: Former    Current packs/day: 0.00    Average packs/day: 1  pack/day for 20.0 years (20.0 ttl pk-yrs)    Types: Cigarettes    Start date: 11/08/1983    Quit date: 11/08/2003    Years since quitting: 20.8    Passive exposure: Current   Smokeless tobacco: Never   Tobacco comments:    quit smoking 10 yrs   Vaping Use   Vaping status: Never Used  Substance and Sexual Activity   Alcohol use: No   Drug use: No   Sexual activity: Not Currently    Birth control/protection: Post-menopausal  Other Topics Concern   Not on file  Social History Narrative   Not on file   Social Drivers of Health   Financial Resource Strain: High Risk (09/17/2024)   Overall Financial Resource Strain (CARDIA)    Difficulty of Paying Living Expenses: Hard  Food Insecurity: Food Insecurity Present (09/17/2024)   Hunger Vital Sign    Worried About Running Out of Food in the Last Year: Sometimes true    Ran Out of Food in the Last Year: Sometimes true  Transportation Needs: No Transportation Needs (09/17/2024)   PRAPARE - Administrator, Civil Service (Medical): No    Lack of Transportation (Non-Medical): No  Physical Activity: Inactive (09/17/2024)   Exercise Vital Sign    Days of Exercise per Week: 0 days    Minutes of Exercise per Session: Not on file  Stress: Stress Concern Present (09/17/2024)   [employer] of Occupational Health - Occupational Stress Questionnaire  Feeling of Stress: Very much  Social Connections: Moderately Isolated (09/17/2024)   Social Connection and Isolation Panel    Frequency of Communication with Friends and Family: Twice a week    Frequency of Social Gatherings with Friends and Family: Once a week    Attends Religious Services: Never    Database Administrator or Organizations: No    Attends Engineer, Structural: Not on file    Marital Status: Married    Review of Systems Per HPI  Objective:  BP (!) 152/86   Pulse 100   Temp 98.4 F (36.9 C)   Wt (!) 314 lb (142.4 kg)   LMP 01/23/2014 Comment:  spotting  SpO2 98%   BMI 53.90 kg/m      09/24/2024   11:38 AM 09/23/2024   10:19 PM 09/18/2024   10:59 AM  BP/Weight  Systolic BP 152 174 140  Diastolic BP 86 106 79  Wt. (Lbs) 314 315.04 315  BMI 53.9 kg/m2 54.08 kg/m2 54.07 kg/m2    Physical Exam Constitutional:      General: She is not in acute distress.    Appearance: She is obese.  HENT:     Head: Normocephalic and atraumatic.     Mouth/Throat:     Pharynx: Oropharynx is clear.  Cardiovascular:     Rate and Rhythm: Normal rate and regular rhythm.  Pulmonary:     Effort: Pulmonary effort is normal.     Breath sounds: Normal breath sounds. No wheezing or rales.  Neurological:     Mental Status: She is alert.     Lab Results  Component Value Date   WBC 7.6 08/17/2024   HGB 16.8 (H) 08/17/2024   HCT 50.1 (H) 08/17/2024   PLT 278 08/17/2024   GLUCOSE 138 (H) 08/17/2024   CHOL 175 04/22/2023   TRIG 188 (H) 04/22/2023   HDL 49 04/22/2023   LDLCALC 94 04/22/2023   ALT 16 08/17/2024   AST 24 08/17/2024   NA 140 08/17/2024   K 4.1 08/17/2024   CL 103 08/17/2024   CREATININE 0.73 08/17/2024   BUN 16 08/17/2024   CO2 27 08/17/2024   TSH 3.540 02/02/2024   INR 1.0 12/31/2022   HGBA1C 6.2 (H) 04/03/2021     Assessment & Plan:  COPD exacerbation (HCC) Assessment & Plan: Treating with azithromycin .  Recommended corticosteroids and patient states that she does not want to corticosteroids.  Orders: -     Azithromycin ; Take 2 tablets on day 1, then 1 tablet daily on days 2 through 5  Dispense: 6 tablet; Refill: 0    Follow-up:  Return if symptoms worsen or fail to improve.  Jacqulyn Ahle DO [facility]

## 2024-09-24 NOTE — Assessment & Plan Note (Signed)
 Treating with azithromycin .  Recommended corticosteroids and patient states that she does not want to corticosteroids.

## 2024-09-24 NOTE — Patient Instructions (Signed)
Antibiotic as prescribed.  Take care  Dr. Oddis Westling  

## 2024-09-24 NOTE — Telephone Encounter (Signed)
 Tried calling no answer, left a voicemail for return call.

## 2024-09-24 NOTE — ED Provider Notes (Signed)
 [facility] Emergency Department Provider Note MRN:  [id number]  Arrival date & time: 09/24/24     Chief Complaint   URI   History of Present Illness   Summer [name] is a 63 y.o. year-old female with a history of fibromyalgia presenting to the ED with chief complaint of URI.  2 days of sore throat, productive cough, sinus congestion, intermittent subjective fever.  No shortness of breath, no chest pain, no nausea vomiting or diarrhea, no abdominal pain.  Review of Systems  A thorough review of systems was obtained and all systems are negative except as noted in the HPI and PMH.   Patient's Health History    Past Medical History:  Diagnosis Date   Arthritis    Bell's palsy    Chest pain, unspecified    Dysrhythmia, cardiac    Fibromyalgia    Hashimoto's disease    History of CT scan 06/2017   coronary CT scan on 06/01/2017, this did not find any cardiac calcifications. Coronary calcium  score of zero.   History of Holter monitoring    Hypertension    Mast cell activation syndrome    Sleep apnea    Thyroid  disease    Urticaria     Past Surgical History:  Procedure Laterality Date   BIOPSY  11/24/2021   Procedure: BIOPSY;  Surgeon: Eartha Angelia Sieving, MD;  Location: [facility];  Service: Gastroenterology;;   COLONOSCOPY N/A 01/23/2014   Procedure: COLONOSCOPY;  Surgeon: Claudis RAYMOND Rivet, MD;  Location: [facility];  Service: Endoscopy;  Laterality: N/A;  200   COLONOSCOPY WITH PROPOFOL  N/A 11/24/2021   Procedure: COLONOSCOPY WITH PROPOFOL ;  Surgeon: Eartha Angelia Sieving, MD;  Location: [facility];  Service: Gastroenterology;  Laterality: N/A;  205   ESOPHAGOGASTRODUODENOSCOPY (EGD) WITH PROPOFOL  N/A 11/24/2021   Procedure: ESOPHAGOGASTRODUODENOSCOPY (EGD) WITH PROPOFOL ;  Surgeon: Eartha Angelia Sieving, MD;  Location: [facility];  Service: Gastroenterology;  Laterality: N/A;   HYSTEROSCOPY WITH D & C N/A 03/22/2023    Procedure: DILATATION AND CURETTAGE /HYSTEROSCOPY WITH MYOSURE;  Surgeon: Ozan, Jennifer, DO;  Location: [facility] ORS;  Service: Gynecology;  Laterality: N/A;   POLYPECTOMY  11/24/2021   Procedure: POLYPECTOMY;  Surgeon: Eartha Angelia, Sieving, MD;  Location: [facility];  Service: Gastroenterology;;   TONSILLECTOMY      Family History  Adopted: Yes  Problem Relation Age of Onset   Anxiety disorder Mother    Cancer Mother        lung   Other Father    Anxiety disorder Sister    Anxiety disorder Brother    Anxiety disorder Daughter    Other Daughter        Lyme's disease    Social History   Socioeconomic History   Marital status: Married    Spouse name: Not on file   Number of children: Not on file   Years of education: Not on file   Highest education level: 12th grade  Occupational History   Not on file  Tobacco Use   Smoking status: Former    Current packs/day: 0.00    Average packs/day: 1 pack/day for 20.0 years (20.0 ttl pk-yrs)    Types: Cigarettes    Start date: 11/08/1983    Quit date: 11/08/2003    Years since quitting: 20.8    Passive exposure: Current   Smokeless tobacco: Never   Tobacco comments:    quit smoking 10 yrs   Vaping Use   Vaping  status: Never Used  Substance and Sexual Activity   Alcohol use: No   Drug use: No   Sexual activity: Not Currently    Birth control/protection: Post-menopausal  Other Topics Concern   Not on file  Social History Narrative   Not on file   Social Drivers of Health   Financial Resource Strain: High Risk (09/17/2024)   Overall Financial Resource Strain (CARDIA)    Difficulty of Paying Living Expenses: Hard  Food Insecurity: Food Insecurity Present (09/17/2024)   Hunger Vital Sign    Worried About Running Out of Food in the Last Year: Sometimes true    Ran Out of Food in the Last Year: Sometimes true  Transportation Needs: No Transportation Needs (09/17/2024)   PRAPARE - Administrator, Civil Service  (Medical): No    Lack of Transportation (Non-Medical): No  Physical Activity: Inactive (09/17/2024)   Exercise Vital Sign    Days of Exercise per Week: 0 days    Minutes of Exercise per Session: Not on file  Stress: Stress Concern Present (09/17/2024)   [employer] of Occupational Health - Occupational Stress Questionnaire    Feeling of Stress: Very much  Social Connections: Moderately Isolated (09/17/2024)   Social Connection and Isolation Panel    Frequency of Communication with Friends and Family: Twice a week    Frequency of Social Gatherings with Friends and Family: Once a week    Attends Religious Services: Never    Database Administrator or Organizations: No    Attends Engineer, Structural: Not on file    Marital Status: Married  Catering Manager Violence: Not on file     Physical Exam   Vitals:   09/23/24 2219  BP: (!) 174/106  Pulse: (!) 103  Resp: 20  Temp: 97.8 F (36.6 C)  SpO2: 98%    CONSTITUTIONAL: Well-appearing, NAD NEURO/PSYCH:  Alert and oriented x 3, no focal deficits EYES:  eyes equal and reactive ENT/NECK:  no LAD, no JVD CARDIO: Regular rate, well-perfused, normal S1 and S2 PULM:  CTAB no wheezing or rhonchi GI/GU:  non-distended, non-tender MSK/SPINE:  No gross deformities, no edema SKIN:  no rash, atraumatic   *Additional and/or pertinent findings included in MDM below  Diagnostic and Interventional Summary    EKG Interpretation Date/Time:    Ventricular Rate:    PR Interval:    QRS Duration:    QT Interval:    QTC Calculation:   R Axis:      Text Interpretation:         Labs Reviewed  RESP PANEL BY RT-PCR (RSV, FLU A&B, COVID)  [id number]  GROUP A STREP BY PCR    No orders to display    Medications - No data to display   Procedures  /  Critical Care Procedures  ED Course and Medical Decision Making  Initial Impression and Ddx Symptoms suggest viral illness.  Patient is well-appearing in no acute distress,  lungs are clear.  Doubt pneumonia.  Fluids considered, strep is considered.  Past medical/surgical history that increases complexity of ED encounter: Asthma  Interpretation of Diagnostics I personally reviewed the Laboratory Testing and my interpretation is as follows: COVID flu RSV and strep negative    Patient Reassessment and Ultimate Disposition/Management     Discharge  Patient management required discussion with the following services or consulting groups:  None  Complexity of Problems Addressed Acute complicated illness or Injury  Additional Data Reviewed and Analyzed Further history  obtained from: Further history from spouse/family member  Additional Factors Impacting ED Encounter Risk Prescriptions  Ozell HERO. Theadore, MD [facility] [email or web address]  Final Clinical Impressions(s) / ED Diagnoses     ICD-10-CM   1. Upper respiratory tract infection, unspecified type  J06.9       ED Discharge Orders          Ordered    fluticasone  (FLONASE ) 50 MCG/ACT nasal spray  Daily        09/24/24 0026    benzonatate  (TESSALON ) 100 MG capsule  Every 8 hours        09/24/24 0026             Discharge Instructions Discussed with and Provided to Patient:    Discharge Instructions      You were evaluated in the Emergency Department and after careful evaluation, we did not find any emergent condition requiring admission or further testing in the hospital.  Your exam/testing today is overall reassuring.  Symptoms likely due to a viral illness causing sinusitis and bronchitis.  Continue your home inhalers, continue Tylenol  and Motrin  for discomfort or fever.  Can use the Flonase  for nasal congestion, can use the Tessalon  to suppress your cough.  Plenty of fluids and rest.  Please return to the Emergency Department if you experience any worsening of your condition.   Thank you for allowing us  to be a part of your  care.      Theadore Ozell HERO, MD 09/24/24 [phone]

## 2024-09-28 DIAGNOSIS — J449 Chronic obstructive pulmonary disease, unspecified: Secondary | ICD-10-CM | POA: Diagnosis not present

## 2024-09-28 DIAGNOSIS — R051 Acute cough: Secondary | ICD-10-CM | POA: Diagnosis not present

## 2024-09-28 DIAGNOSIS — J22 Unspecified acute lower respiratory infection: Secondary | ICD-10-CM | POA: Diagnosis not present

## 2024-09-29 ENCOUNTER — Other Ambulatory Visit: Payer: Self-pay | Admitting: Allergy & Immunology

## 2024-10-01 ENCOUNTER — Other Ambulatory Visit: Payer: Self-pay | Admitting: Family Medicine

## 2024-10-01 MED ORDER — DOXYCYCLINE HYCLATE 100 MG PO CAPS: 100.0000 mg | ORAL_CAPSULE | Freq: Two times a day (BID) | ORAL | 0 refills | Status: DC

## 2024-10-03 ENCOUNTER — Ambulatory Visit: Admitting: Family Medicine

## 2024-10-03 ENCOUNTER — Ambulatory Visit: Payer: Self-pay

## 2024-10-03 ENCOUNTER — Other Ambulatory Visit: Payer: Self-pay | Admitting: Family Medicine

## 2024-10-03 VITALS — HR 76 | Temp 98.4°F

## 2024-10-03 DIAGNOSIS — J019 Acute sinusitis, unspecified: Secondary | ICD-10-CM

## 2024-10-03 MED ORDER — AMOXICILLIN-POT CLAVULANATE 875-125 MG PO TABS: 1.0000 | ORAL_TABLET | Freq: Two times a day (BID) | ORAL | 0 refills | Status: DC

## 2024-10-03 NOTE — Telephone Encounter (Signed)
 FYI Only or Action Required?: FYI only for provider: appointment scheduled on 10/04/2024 at 2:30 PM.  Patient was last seen in primary care on 09/24/2024 by Bluford Jacqulyn MATSU, DO.  Called Nurse Triage reporting Cough.  Symptoms began 2 weeks ago.  Interventions attempted: Rest, hydration, or home remedies.  Symptoms are: unchanged.  Triage Disposition: See Physician Within 24 Hours  Patient/caregiver understands and will follow disposition?: Yes  Copied from CRM #[id number]. Topic: Clinical - Red Word Triage >> Oct 03, 2024  8:36 AM Charlet HERO wrote: Red Word that prompted transfer to Nurse Triage: Patient is calling about sinus infection for both her and her husband got seen last week and still not feeling any better, achy sore throat, congestion, phlem yellow for both. Summer [name] 04/14/1960. Dr Alphonsa Reason for Disposition  [1] Continuous (nonstop) coughing interferes with work or school AND [2] no improvement using cough treatment per Care Advice  Answer Assessment - Initial Assessment Questions Patient with COPD called to report continued cough and congestion. Patient endorses yellow sputum when coughing. Patient reports she and her husband were seen in the office and placed on medication to help with symptoms. Patient states she went to urgent care this past Friday and was put on another antibiotic for her symptoms. Patient is requesting to be reevaluated. Patient scheduled to see a provider in PCP office tomorrow at 2:30 PM  1. ONSET: When did the cough begin?      Started two weeks ago 2. SEVERITY: How bad is the cough today?      moderate 3. SPUTUM: Describe the color of your sputum (e.g., none, dry cough; clear, white, yellow, green)     Yellow  4. HEMOPTYSIS: Are you coughing up any blood? If Yes, ask: How much? (e.g., flecks, streaks, tablespoons, etc.)     no 5. DIFFICULTY BREATHING: Are you having difficulty breathing? If Yes, ask: How bad is it? (e.g., mild,  moderate, severe)      Feeling winded at times 6. FEVER: Do you have a fever? If Yes, ask: What is your temperature, how was it measured, and when did it start?     no 7. CARDIAC HISTORY: Do you have any history of heart disease? (e.g., heart attack, congestive heart failure)      yes 8. LUNG HISTORY: Do you have any history of lung disease?  (e.g., pulmonary embolus, asthma, emphysema)     yes 9. PE RISK FACTORS: Do you have a history of blood clots? (or: recent major surgery, recent prolonged travel, bedridden)     no 10. OTHER SYMPTOMS: Do you have any other symptoms? (e.g., runny nose, wheezing, chest pain)       Runny nose, wheezing and chest tightness 12. TRAVEL: Have you traveled out of the country in the last month? (e.g., travel history, exposures)       no  Protocols used: Cough - Acute Productive-A-AH

## 2024-10-04 ENCOUNTER — Ambulatory Visit: Payer: Self-pay | Admitting: Family Medicine

## 2024-10-04 NOTE — Progress Notes (Signed)
   Subjective:    Patient ID: Summer [name], female    DOB: 30-Nov-1960, 63 y.o.   MRN: [id number]  HPI Patient with prolonged upper respiratory illness that has moved into the chest been going on for the past couple weeks increased coughing congestion denies high fever chills or sweats does relate feeling somewhat rundown with productive coughing has been on antibiotics recently went to urgent care they put her on a low-dose Augmentin    Review of Systems     Objective:   Physical Exam  Gen-NAD not toxic TMS-normal bilateral T- normal no redness Chest-CTA respiratory rate normal no crackles CV RRR no murmur Skin-warm dry Neuro-grossly normal       Assessment & Plan:  I do not find evidence of pneumonia More than likely a sinus infection secondary to a virus along with some chest congestion from drainage I would not recommend x-rays lab work or hospitalization If progressive troubles or problems to follow-up immediately Otherwise looks increase the dose of Augmentin  to 875 twice daily for the next 10 days albuterol  as needed

## 2024-10-05 ENCOUNTER — Other Ambulatory Visit

## 2024-10-08 ENCOUNTER — Ambulatory Visit (HOSPITAL_COMMUNITY): Admission: RE | Admit: 2024-10-08 | Source: Ambulatory Visit

## 2024-10-11 ENCOUNTER — Ambulatory Visit: Admitting: "Endocrinology

## 2024-10-12 ENCOUNTER — Telehealth (INDEPENDENT_AMBULATORY_CARE_PROVIDER_SITE_OTHER): Payer: Self-pay | Admitting: Gastroenterology

## 2024-10-12 NOTE — Telephone Encounter (Signed)
 Contacted patient. Pt states her acid reflux has worsened. She came down with a bad virus and that made her heartburn worsen. Pt states she increased her Pepcid . She was taking 40 mg BID but then backed off of it since she was doing better, then went to 20 mg in the morning and 40 mg at night, she then went to 20 mg morning and night. She states she is now taking 40 mg in the morning and 40 mg at night. Also picked up omeprazole  yesterday since that is the only thing she knows will heal your throat-she states it didn't work last time it was prescribed. She is beginning to take that daily. Pt reports that she can't hardly talk or eat and drinking is hard also. She is eating yogurt and eggs. Pt states she has been seeing Chelsea and would rather continue to see her Please advise. Thank yoU!

## 2024-10-12 NOTE — Telephone Encounter (Signed)
 Patient called to make OV for her reflux. She is aware that Dr Eartha is leaving the practice next week and I offered her an OV with Dr Cinderella for 1/7, but she would rather see [facility]. I told her Chelsea's next available would be 1/29 at 0845. She wants to know what she needs to do in the meantime. I told her I put her on a cancellation list and I would have the nurse call her. [phone]

## 2024-10-15 ENCOUNTER — Ambulatory Visit (INDEPENDENT_AMBULATORY_CARE_PROVIDER_SITE_OTHER): Admitting: Gastroenterology

## 2024-10-15 ENCOUNTER — Encounter (INDEPENDENT_AMBULATORY_CARE_PROVIDER_SITE_OTHER): Payer: Self-pay | Admitting: Gastroenterology

## 2024-10-15 ENCOUNTER — Telehealth (INDEPENDENT_AMBULATORY_CARE_PROVIDER_SITE_OTHER): Payer: Self-pay

## 2024-10-15 VITALS — BP 185/77 | HR 85 | Temp 98.0°F | Ht 64.0 in | Wt 313.5 lb

## 2024-10-15 DIAGNOSIS — K219 Gastro-esophageal reflux disease without esophagitis: Secondary | ICD-10-CM

## 2024-10-15 DIAGNOSIS — Z1211 Encounter for screening for malignant neoplasm of colon: Secondary | ICD-10-CM | POA: Insufficient documentation

## 2024-10-15 DIAGNOSIS — Z860101 Personal history of adenomatous and serrated colon polyps: Secondary | ICD-10-CM | POA: Insufficient documentation

## 2024-10-15 DIAGNOSIS — R1310 Dysphagia, unspecified: Secondary | ICD-10-CM | POA: Diagnosis not present

## 2024-10-15 MED ORDER — PEG 3350-KCL-NA BICARB-NACL 420 G PO SOLR: 4000.0000 mL | Freq: Once | ORAL | 0 refills | Status: AC

## 2024-10-15 MED ORDER — SUCRALFATE 1 GM/10ML PO SUSP: 1.0000 g | Freq: Four times a day (QID) | ORAL | 1 refills | Status: DC

## 2024-10-15 MED ORDER — OMEPRAZOLE 40 MG PO CPDR: 40.0000 mg | DELAYED_RELEASE_CAPSULE | Freq: Two times a day (BID) | ORAL | 2 refills | Status: AC

## 2024-10-15 NOTE — Telephone Encounter (Signed)
 PA on [facility] for TCS/EGD: Status: PENDED  Reason: 1.Disposition pending review  Tracking #: [id number]

## 2024-10-15 NOTE — Progress Notes (Addendum)
 Referring Provider: Alphonsa Glendia LABOR, MD Primary Care Physician:  Alphonsa Glendia LABOR, MD Primary GI Physician: Dr. Eartha   Chief Complaint  Patient presents with   Gastroesophageal Reflux    Pt states her acid reflux has worsened. She came down with a bad virus and that made her heartburn worsen. Pt states she increased her Pepcid . She was taking 40 mg BID but then backed off of it since she was doing better, then went to 20 mg in the morning and 40 mg at night, she then went to 20 mg morning and night. She states she is now taking 40 mg in the morning and 40 mg at night. Also picked up omeprazole  yesterday since that is the only thing she knows will heal your throat-she states it didn't wo   HPI:   Summer [name] is a 62 y.o. female with past medical history of heart failure (normal EF July 2022), Fibromyalgia, Bell's palsy, hypothyroidism, HTN.    Patient presenting today for:  GERD and dysphagia  History of tubular adenomas   Last seen April, at that time, taking famotidine  40mg  BID with continued reflux symptoms. Having some dysphagia. Wanted to hold off on EGD. Still with daily abdominal pain and nausea, reported orange stools with constipation and diarrhea.   Recommended to repeat US  for GB lesion, continue pepcid  40mg  BID, mediterranean/mast cell diet, EGD/Colonoscopy January 2026, dexilant  60mg  daily  Present:  States she and her husband were recently very sick prior to thanksgiving with a virus (negative for flu and covid). She was put on doxycycline  and states that she had vomiting from being on this. She had a lot of coughing and feels that her reflux has flared up. She is having a lot of acid coming up in her throat. Having to sit up in the recliner to sleep as it is worse when she lays down. She has been taking famotidine  40mg  BID, she had previously been doing well so she decreased her dose but once she got sick, she increased her dose back to 40mg  BID. She Is trying to do  softer foods right now to avoid worsening her issues. She is having a lot of dysphagia, feeling she can choke on her own saliva and is also having choking when she eats. She does note her mast cell has flared up as well so this may be contributing. She has chronic, occasional abdominal pain. She did try dexilant  after the last visit and felt it helped some but was unsure if she should continue it. She is taking omeprazole  40mg  daily as she called last week inquiring about what she could take to help with her symptoms.   Patient also reports some discoloration to L ankle which was purple in color earlier but this has resolved. Some pain there and area felt red earlier this morning. Denies numbness tingling in her food. No history of DVT.  Negative SIBO breath test 11/2023 Last Colonoscopy:11/24/21- Hemorrhoids found on perianal exam. - Six 3 to 8 mm polyps in the transverse colon, in the ascending colon and in the cecum, - Two 1 to 2 mm polyps in the transverse colon - Medium-sized lipoma in the transverse colon. - Three 3 to 5 mm polyps in the rectum and in the sigmoid colon - The entire examined colon is normal. Biopsied-normal - Internal hemorrhoids.  (total of 9 tubular adenomas, one submucosal lipoma and 1 hyperplastic polyp) Last Endoscopy:11/24/21 2 cm hiatal hernia. - Normal stomach. Biopsied-normal-negative for h pylori -  Normal examined duodenum. Biopsied-normal- no celiac   Recommended repeat Colonoscopy in 3 years   Past Medical History:  Diagnosis Date   Arthritis    Bell's palsy    Chest pain, unspecified    Dysrhythmia, cardiac    Fibromyalgia    Hashimoto's disease    History of CT scan 06/2017   coronary CT scan on 06/01/2017, this did not find any cardiac calcifications. Coronary calcium  score of zero.   History of Holter monitoring    Hypertension    Mast cell activation syndrome    Sleep apnea    Thyroid  disease    Urticaria     Past Surgical History:   Procedure Laterality Date   BIOPSY  11/24/2021   Procedure: BIOPSY;  Surgeon: Eartha Angelia Sieving, MD;  Location: [facility];  Service: Gastroenterology;;   COLONOSCOPY N/A 01/23/2014   Procedure: COLONOSCOPY;  Surgeon: Claudis RAYMOND Rivet, MD;  Location: [facility];  Service: Endoscopy;  Laterality: N/A;  200   COLONOSCOPY WITH PROPOFOL  N/A 11/24/2021   Procedure: COLONOSCOPY WITH PROPOFOL ;  Surgeon: Eartha Angelia Sieving, MD;  Location: [facility];  Service: Gastroenterology;  Laterality: N/A;  205   ESOPHAGOGASTRODUODENOSCOPY (EGD) WITH PROPOFOL  N/A 11/24/2021   Procedure: ESOPHAGOGASTRODUODENOSCOPY (EGD) WITH PROPOFOL ;  Surgeon: Eartha Angelia Sieving, MD;  Location: [facility];  Service: Gastroenterology;  Laterality: N/A;   HYSTEROSCOPY WITH D & C N/A 03/22/2023   Procedure: DILATATION AND CURETTAGE /HYSTEROSCOPY WITH MYOSURE;  Surgeon: Ozan, Jennifer, DO;  Location: [facility];  Service: Gynecology;  Laterality: N/A;   POLYPECTOMY  11/24/2021   Procedure: POLYPECTOMY;  Surgeon: Eartha Angelia, Sieving, MD;  Location: [facility];  Service: Gastroenterology;;   TONSILLECTOMY      Current Outpatient Medications  Medication Sig Dispense Refill   albuterol  (PROVENTIL ) (2.5 MG/3ML) 0.083% nebulizer solution USE 1 VIAL IN NEBULIZER EVERY 6 HOURS AS NEEDED FOR WHEEZING OR SHORTNESS OF BREATH 75 mL 0   ALPRAZolam  (XANAX ) 0.5 MG tablet Take 1 tablet (0.5 mg total) by mouth 2 (two) times daily as needed. for anxiety 28 tablet 0   Ascorbic Acid (VITAMIN C) 500 MG CAPS Take 500 mg by mouth daily.     B Complex-C (SUPER B COMPLEX PO) Take 1 tablet by mouth daily at 6 (six) AM.     benzonatate  (TESSALON ) 100 MG capsule Take 1 capsule (100 mg total) by mouth every 8 (eight) hours. 21 capsule 0   Bisoprolol  Fumarate 2.5 MG TABS Take 2.5 mg by mouth daily. 90 tablet 3   budesonide -formoterol  (SYMBICORT ) 80-4.5 MCG/ACT inhaler Inhale 2 puffs twice a day with spacer to help prevent cough  and wheeze 1 each 5   cetirizine  (ZYRTEC ) 10 MG tablet Take 2 tablets (20 mg total) by mouth 2 (two) times daily. 60 tablet 5   Cholecalciferol (VITAMIN D-3) 5000 UNITS TABS Take 5,000 Units by mouth daily.     cromolyn  (GASTROCROM ) 100 MG/5ML solution TAKE 5 ML BY MOUTH  4 TIMES DAILY( BEFORE MEAL(S) AND AT BEDTIME). 600 mL 5   cromolyn  (INTAL ) 20 MG/2ML nebulizer solution USE 1  IN NEBULIZER 4 TIMES DAILY IN  THE  MORNING,  AT  NOON,  IN  THE  EVENING,  AND  AT  BEDTIME 120 mL 0   cromolyn  (OPTICROM ) 4 % ophthalmic solution 2 drops 2 (two) times daily.     EPINEPHrine  0.3 mg/0.3 mL IJ SOAJ injection INJECT CONTENTS OF 1 PEN AS NEEDED FOR ANAPHYLAXIS 2 each 1  famotidine  (PEPCID ) 40 MG tablet Take 1 tablet (40 mg total) by mouth 2 (two) times daily. 60 tablet 5   fluticasone  (FLONASE ) 50 MCG/ACT nasal spray Place 2 sprays into both nostrils daily for 5 days. 9.9 mL 0   furosemide  (LASIX ) 20 MG tablet 1  qam prn edema 30 tablet 5   hydrOXYzine  (ATARAX ) 25 MG tablet TAKE 2 TABLETS BY MOUTH AT BEDTIME 60 tablet 0   levocetirizine (XYZAL ) 5 MG tablet TAKE 2 TABLETS BY MOUTH IN THE MORNING AND AT BEDTIME 360 tablet 0   Magnesium 200 MG TABS Take 200 mg by mouth 2 (two) times daily.     metoprolol  tartrate (LOPRESSOR ) 100 MG tablet Take 1 tablet (100 mg total) by mouth as directed. Take 2 hours prior to cardiac CT scan 1 tablet 0   Naltrexone  HCl, Pain, 4.5 MG CAPS Take contents of half a capsule nightly for two weeks and then increase to one capsule nightly thereafter. 90 capsule 0   Omega-3 Fatty Acids (FISH OIL) 1200 MG CAPS Take 1,200 mg by mouth daily.     OMEPRAZOLE  PO Take by mouth.     potassium chloride  SA (KLOR-CON  M) 20 MEQ tablet 1 every day prn when taking lasix  30 tablet 5   pregabalin  (LYRICA ) 25 MG capsule Take 1 capsule (25 mg total) by mouth 2 (two) times daily. 60 capsule 3   Probiotic Product (PROBIOTIC PO) Take 100 mg by mouth daily.     rosuvastatin  (CRESTOR ) 5 MG tablet Take 1  tablet (5 mg total) by mouth daily. 30 tablet 5   thyroid  (NP THYROID ) 60 MG tablet TAKE 1 TABLET BY MOUTH ONCE DAILY BEFORE BREAKFAST 30 tablet 6   VENTOLIN  HFA 108 (90 Base) MCG/ACT inhaler INHALE 2 PUFFS BY MOUTH EVERY 6 HOURS AS NEEDED FOR WHEEZING AND FOR SHORTNESS OF BREATH 8 g 1   amoxicillin -clavulanate (AUGMENTIN ) 875-125 MG tablet Take 1 tablet by mouth 2 (two) times daily. (Patient not taking: Reported on 10/15/2024) 20 tablet 0   dexlansoprazole  (DEXILANT ) 60 MG capsule Take 1 capsule (60 mg total) by mouth daily. (Patient not taking: Reported on 10/15/2024) 30 capsule 1   No current facility-administered medications for this visit.    Allergies as of 10/15/2024 - Review Complete 10/15/2024  Allergen Reaction Noted   Bee venom Anaphylaxis and Hives 09/06/2012   Molds & smuts Shortness Of Breath 11/20/2021   Shellfish allergy Anaphylaxis and Hives 09/06/2012   Avelox [moxifloxacin hcl in nacl] Other (See Comments) 11/22/2013   Moxifloxacin Other (See Comments) 11/22/2013   Ciprofloxacin Other (See Comments) 01/31/2023   Biaxin  [clarithromycin ]  11/29/2022   Tape Rash 11/20/2021    Social History   Socioeconomic History   Marital status: Married    Spouse name: Not on file   Number of children: Not on file   Years of education: Not on file   Highest education level: 12th grade  Occupational History   Not on file  Tobacco Use   Smoking status: Former    Current packs/day: 0.00    Average packs/day: 1 pack/day for 20.0 years (20.0 ttl pk-yrs)    Types: Cigarettes    Start date: 11/08/1983    Quit date: 11/08/2003    Years since quitting: 20.9    Passive exposure: Current   Smokeless tobacco: Never   Tobacco comments:    quit smoking 10 yrs   Vaping Use   Vaping status: Never Used  Substance and Sexual Activity   Alcohol  use: No   Drug use: No   Sexual activity: Not Currently    Birth control/protection: Post-menopausal  Other Topics Concern   Not on file   Social History Narrative   Not on file   Social Drivers of Health   Tobacco Use: Medium Risk (10/15/2024)   Patient History    Smoking Tobacco Use: Former    Smokeless Tobacco Use: Never    Passive Exposure: Current  Physicist, Medical Strain: High Risk (09/17/2024)   Overall Financial Resource Strain (CARDIA)    Difficulty of Paying Living Expenses: Hard  Food Insecurity: Food Insecurity Present (09/17/2024)   Epic    Worried About Programme Researcher, Broadcasting/film/video in the Last Year: Sometimes true    Ran Out of Food in the Last Year: Sometimes true  Transportation Needs: No Transportation Needs (09/17/2024)   Epic    Lack of Transportation (Medical): No    Lack of Transportation (Non-Medical): No  Physical Activity: Inactive (09/17/2024)   Exercise Vital Sign    Days of Exercise per Week: 0 days    Minutes of Exercise per Session: Not on file  Stress: Stress Concern Present (09/17/2024)   [employer] of Occupational Health - Occupational Stress Questionnaire    Feeling of Stress: Very much  Social Connections: Moderately Isolated (09/17/2024)   Social Connection and Isolation Panel    Frequency of Communication with Friends and Family: Twice a week    Frequency of Social Gatherings with Friends and Family: Once a week    Attends Religious Services: Never    Database Administrator or Organizations: No    Attends Engineer, Structural: Not on file    Marital Status: Married  Depression (PHQ2-9): High Risk (09/24/2024)   Depression (PHQ2-9)    PHQ-2 Score: 14  Alcohol Screen: Low Risk (08/03/2023)   Alcohol Screen    Last Alcohol Screening Score (AUDIT): 0  Housing: Low Risk (09/17/2024)   Epic    Unable to Pay for Housing in the Last Year: No    Number of Times Moved in the Last Year: 0    Homeless in the Last Year: No  Utilities: Not on file  Health Literacy: Not on file    Review of systems General: negative for malaise, night sweats, fever, chills, weight  los Neck: Negative for lumps, goiter, pain and significant neck swelling Resp: Negative for cough, wheezing, dyspnea at rest CV: Negative for chest pain, leg swelling, palpitations, orthopnea GI: denies melena, hematochezia, nausea, vomiting, diarrhea, constipation, dysphagia, odyonophagia, early satiety or unintentional weight loss.  MSK: Negative for joint pain or, back pain +swelling to Left ankle/lower leg Derm: Negative for itching or rash Psych: Denies depression, anxiety, memory loss, confusion. No homicidal or suicidal ideation.  Heme: Negative for prolonged bleeding, bruising easily, and swollen nodes. Endocrine: Negative for cold or heat intolerance, polyuria, polydipsia and goiter. Neuro: negative for tremor, gait imbalance, syncope and seizures. The remainder of the review of systems is noncontributory.  Physical Exam: LMP 01/23/2014 Comment: spotting General:   Alert and oriented. No distress noted. Pleasant and cooperative.  Head:  Normocephalic and atraumatic. Eyes:  Conjuctiva clear without scleral icterus. Mouth:  Oral mucosa pink and moist. Good dentition. No lesions. Heart: Normal rate and rhythm, s1 and s2 heart sounds present.  Lungs: Clear lung sounds in all lobes. Respirations equal and unlabored. Abdomen:  +BS, soft, non-tender and non-distended. No rebound or guarding. No HSM or masses noted. Derm: No palmar erythema or jaundice  Msk:  Symmetrical without gross deformities. Normal posture. No erythema, discoloration or temperature change Extremities:  Without edema. Neurologic:  Alert and  oriented x4 Psych:  Alert and cooperative. Normal mood and affect.  Invalid input(s): 6 MONTHS   ASSESSMENT: Summer [name] is a 63 y.o. female presenting today for GERD, dysphagia  and history of tubular adenomas of the colon  GERD/Dysphagia: ongoing dysphagia, patient had previously wanted to wait until January when colonoscopy was due to pursue EGD. She notes recent  URI infection/virus which flared up her reflux. Having a lot of discomfort in her throat/esophagus, worsening dysphagia and acid regurgitation. Did have doxycycline  during acute illness which could have precipitated some worsening UGI symptoms. No NSAID use.  Was taking famotidine , she added omeprazole  at my recommendation after she called last week, notes some mild improvement. At this time, will go ahead and get her scheduled for EGD, will increase omeprazole  to 40mg  BID, continue famotidine  40mg  at bedtime, add carafate  1g QID, instructed on good reflux precautions.   History of colonic tubular adenomas: due for Colonoscopy in January 2026, as above, will get her on the schedule for TCS and EGD. Indications, risks and benefits of procedure discussed in detail with patient. Patient verbalized understanding and is in agreement to proceed with EGD/Colonoscopy at this time.   In regard to her Left leg/ankle pain, no obvious signs of ischemia, I recommended she contact her PCP for further evaluation and to rule out DVT.    PLAN:  -omeprazole  40mg  BID -famotidine  40mg  at bedtime -carafate  1g QID  (dissolve tablet in 1/2 oz water , mix and drink) -good reflux precautions, bland/soft diet -schedule EGD/Colonoscopy ASA III  -see PCP regarding left leg/ankle pain   All questions were answered, patient verbalized understanding and is in agreement with plan as outlined above.   Follow Up: 2 months   Aspin Palomarez L. Mariette, MSN, APRN, AGNP-C Adult-Gerontology Nurse Practitioner [facility]  I have reviewed the note and agree with the APP's assessment as described in this progress note  Toribio Fortune, MD Gastroenterology and Hepatology [facility]astroenter[facility]

## 2024-10-15 NOTE — Telephone Encounter (Signed)
 Spoke with patient in the office, scheduled TCS/EGD for 11/05/2024 at 2:00pm. Rx sent to pharmacy. Instructions given to patient on paper.

## 2024-10-15 NOTE — Telephone Encounter (Signed)
 Patient is aware to be here today to see Chelsea at [phone].

## 2024-10-15 NOTE — H&P (View-Only) (Signed)
 Referring Provider: Alphonsa Glendia LABOR, MD Primary Care Physician:  Alphonsa Glendia LABOR, MD Primary GI Physician: Dr. Eartha   Chief Complaint  Patient presents with   Gastroesophageal Reflux    Pt states her acid reflux has worsened. She came down with a bad virus and that made her heartburn worsen. Pt states she increased her Pepcid . She was taking 40 mg BID but then backed off of it since she was doing better, then went to 20 mg in the morning and 40 mg at night, she then went to 20 mg morning and night. She states she is now taking 40 mg in the morning and 40 mg at night. Also picked up omeprazole  yesterday since that is the only thing she knows will heal your throat-she states it didn't wo   HPI:   Summer [name] is a 62 y.o. female with past medical history of heart failure (normal EF July 2022), Fibromyalgia, Bell's palsy, hypothyroidism, HTN.    Patient presenting today for:  GERD and dysphagia  History of tubular adenomas   Last seen April, at that time, taking famotidine  40mg  BID with continued reflux symptoms. Having some dysphagia. Wanted to hold off on EGD. Still with daily abdominal pain and nausea, reported orange stools with constipation and diarrhea.   Recommended to repeat US  for GB lesion, continue pepcid  40mg  BID, mediterranean/mast cell diet, EGD/Colonoscopy January 2026, dexilant  60mg  daily  Present:  States she and her husband were recently very sick prior to thanksgiving with a virus (negative for flu and covid). She was put on doxycycline  and states that she had vomiting from being on this. She had a lot of coughing and feels that her reflux has flared up. She is having a lot of acid coming up in her throat. Having to sit up in the recliner to sleep as it is worse when she lays down. She has been taking famotidine  40mg  BID, she had previously been doing well so she decreased her dose but once she got sick, she increased her dose back to 40mg  BID. She Is trying to do  softer foods right now to avoid worsening her issues. She is having a lot of dysphagia, feeling she can choke on her own saliva and is also having choking when she eats. She does note her mast cell has flared up as well so this may be contributing. She has chronic, occasional abdominal pain. She did try dexilant  after the last visit and felt it helped some but was unsure if she should continue it. She is taking omeprazole  40mg  daily as she called last week inquiring about what she could take to help with her symptoms.   Patient also reports some discoloration to L ankle which was purple in color earlier but this has resolved. Some pain there and area felt red earlier this morning. Denies numbness tingling in her food. No history of DVT.  Negative SIBO breath test 11/2023 Last Colonoscopy:11/24/21- Hemorrhoids found on perianal exam. - Six 3 to 8 mm polyps in the transverse colon, in the ascending colon and in the cecum, - Two 1 to 2 mm polyps in the transverse colon - Medium-sized lipoma in the transverse colon. - Three 3 to 5 mm polyps in the rectum and in the sigmoid colon - The entire examined colon is normal. Biopsied-normal - Internal hemorrhoids.  (total of 9 tubular adenomas, one submucosal lipoma and 1 hyperplastic polyp) Last Endoscopy:11/24/21 2 cm hiatal hernia. - Normal stomach. Biopsied-normal-negative for h pylori -  Normal examined duodenum. Biopsied-normal- no celiac   Recommended repeat Colonoscopy in 3 years   Past Medical History:  Diagnosis Date   Arthritis    Bell's palsy    Chest pain, unspecified    Dysrhythmia, cardiac    Fibromyalgia    Hashimoto's disease    History of CT scan 06/2017   coronary CT scan on 06/01/2017, this did not find any cardiac calcifications. Coronary calcium  score of zero.   History of Holter monitoring    Hypertension    Mast cell activation syndrome    Sleep apnea    Thyroid  disease    Urticaria     Past Surgical History:   Procedure Laterality Date   BIOPSY  11/24/2021   Procedure: BIOPSY;  Surgeon: Eartha Angelia Sieving, MD;  [facility];  Service: Gastroenterology;;   COLONOSCOPY N/A 01/23/2014   Procedure: COLONOSCOPY;  Surgeon: Claudis RAYMOND Rivet, MD;  Location: [facility];  Service: Endoscopy;  Laterality: N/A;  200   COLONOSCOPY WITH PROPOFOL  N/A 11/24/2021   Procedure: COLONOSCOPY WITH PROPOFOL ;  Surgeon: Eartha Angelia Sieving, MD;  Location: [facility];  Service: Gastroenterology;  Laterality: N/A;  205   ESOPHAGOGASTRODUODENOSCOPY (EGD) WITH PROPOFOL  N/A 11/24/2021   Procedure: ESOPHAGOGASTRODUODENOSCOPY (EGD) WITH PROPOFOL ;  Surgeon: Eartha Angelia Sieving, MD;  [facility]: [facility];  Service: Gastroenterology;  Laterality: N/A;   HYSTEROSCOPY WITH D & C N/A 03/22/2023   Procedure: DILATATION AND CURETTAGE /HYSTEROSCOPY WITH MYOSURE;  Surgeon: Ozan, Jennifer, DO;  Location: [facility];  Service: Gynecology;  Laterality: N/A;   POLYPECTOMY  11/24/2021   Procedure: POLYPECTOMY;  Surgeon: Eartha Angelia, Sieving, MD;  Location: [facility];  Service: Gastroenterology;;   TONSILLECTOMY      Current Outpatient Medications  Medication Sig Dispense Refill   albuterol  (PROVENTIL ) (2.5 MG/3ML) 0.083% nebulizer solution USE 1 VIAL IN NEBULIZER EVERY 6 HOURS AS NEEDED FOR WHEEZING OR SHORTNESS OF BREATH 75 mL 0   ALPRAZolam  (XANAX ) 0.5 MG tablet Take 1 tablet (0.5 mg total) by mouth 2 (two) times daily as needed. for anxiety 28 tablet 0   Ascorbic Acid (VITAMIN C) 500 MG CAPS Take 500 mg by mouth daily.     B Complex-C (SUPER B COMPLEX PO) Take 1 tablet by mouth daily at 6 (six) AM.     benzonatate  (TESSALON ) 100 MG capsule Take 1 capsule (100 mg total) by mouth every 8 (eight) hours. 21 capsule 0   Bisoprolol  Fumarate 2.5 MG TABS Take 2.5 mg by mouth daily. 90 tablet 3   budesonide -formoterol  (SYMBICORT ) 80-4.5 MCG/ACT inhaler Inhale 2 puffs twice a day with spacer to help prevent cough  and wheeze 1 each 5   cetirizine  (ZYRTEC ) 10 MG tablet Take 2 tablets (20 mg total) by mouth 2 (two) times daily. 60 tablet 5   Cholecalciferol (VITAMIN D-3) 5000 UNITS TABS Take 5,000 Units by mouth daily.     cromolyn  (GASTROCROM ) 100 MG/5ML solution TAKE 5 ML BY MOUTH  4 TIMES DAILY( BEFORE MEAL(S) AND AT BEDTIME). 600 mL 5   cromolyn  (INTAL ) 20 MG/2ML nebulizer solution USE 1  IN NEBULIZER 4 TIMES DAILY IN  THE  MORNING,  AT  NOON,  IN  THE  EVENING,  AND  AT  BEDTIME 120 mL 0   cromolyn  (OPTICROM ) 4 % ophthalmic solution 2 drops 2 (two) times daily.     EPINEPHrine  0.3 mg/0.3 mL IJ SOAJ injection INJECT CONTENTS OF 1 PEN AS NEEDED FOR ANAPHYLAXIS 2 each 1  famotidine  (PEPCID ) 40 MG tablet Take 1 tablet (40 mg total) by mouth 2 (two) times daily. 60 tablet 5   fluticasone  (FLONASE ) 50 MCG/ACT nasal spray Place 2 sprays into both nostrils daily for 5 days. 9.9 mL 0   furosemide  (LASIX ) 20 MG tablet 1  qam prn edema 30 tablet 5   hydrOXYzine  (ATARAX ) 25 MG tablet TAKE 2 TABLETS BY MOUTH AT BEDTIME 60 tablet 0   levocetirizine (XYZAL ) 5 MG tablet TAKE 2 TABLETS BY MOUTH IN THE MORNING AND AT BEDTIME 360 tablet 0   Magnesium 200 MG TABS Take 200 mg by mouth 2 (two) times daily.     metoprolol  tartrate (LOPRESSOR ) 100 MG tablet Take 1 tablet (100 mg total) by mouth as directed. Take 2 hours prior to cardiac CT scan 1 tablet 0   Naltrexone  HCl, Pain, 4.5 MG CAPS Take contents of half a capsule nightly for two weeks and then increase to one capsule nightly thereafter. 90 capsule 0   Omega-3 Fatty Acids (FISH OIL) 1200 MG CAPS Take 1,200 mg by mouth daily.     OMEPRAZOLE  PO Take by mouth.     potassium chloride  SA (KLOR-CON  M) 20 MEQ tablet 1 every day prn when taking lasix  30 tablet 5   pregabalin  (LYRICA ) 25 MG capsule Take 1 capsule (25 mg total) by mouth 2 (two) times daily. 60 capsule 3   Probiotic Product (PROBIOTIC PO) Take 100 mg by mouth daily.     rosuvastatin  (CRESTOR ) 5 MG tablet Take 1  tablet (5 mg total) by mouth daily. 30 tablet 5   thyroid  (NP THYROID ) 60 MG tablet TAKE 1 TABLET BY MOUTH ONCE DAILY BEFORE BREAKFAST 30 tablet 6   VENTOLIN  HFA 108 (90 Base) MCG/ACT inhaler INHALE 2 PUFFS BY MOUTH EVERY 6 HOURS AS NEEDED FOR WHEEZING AND FOR SHORTNESS OF BREATH 8 g 1   amoxicillin -clavulanate (AUGMENTIN ) 875-125 MG tablet Take 1 tablet by mouth 2 (two) times daily. (Patient not taking: Reported on 10/15/2024) 20 tablet 0   dexlansoprazole  (DEXILANT ) 60 MG capsule Take 1 capsule (60 mg total) by mouth daily. (Patient not taking: Reported on 10/15/2024) 30 capsule 1   No current facility-administered medications for this visit.    Allergies as of 10/15/2024 - Review Complete 10/15/2024  Allergen Reaction Noted   Bee venom Anaphylaxis and Hives 09/06/2012   Molds & smuts Shortness Of Breath 11/20/2021   Shellfish allergy Anaphylaxis and Hives 09/06/2012   Avelox [moxifloxacin hcl in nacl] Other (See Comments) 11/22/2013   Moxifloxacin Other (See Comments) 11/22/2013   Ciprofloxacin Other (See Comments) 01/31/2023   Biaxin  [clarithromycin ]  11/29/2022   Tape Rash 11/20/2021    Social History   Socioeconomic History   Marital status: Married    Spouse name: Not on file   Number of children: Not on file   Years of education: Not on file   Highest education level: 12th grade  Occupational History   Not on file  Tobacco Use   Smoking status: Former    Current packs/day: 0.00    Average packs/day: 1 pack/day for 20.0 years (20.0 ttl pk-yrs)    Types: Cigarettes    Start date: 11/08/1983    Quit date: 11/08/2003    Years since quitting: 20.9    Passive exposure: Current   Smokeless tobacco: Never   Tobacco comments:    quit smoking 10 yrs   Vaping Use   Vaping status: Never Used  Substance and Sexual Activity   Alcohol  use: No   Drug use: No   Sexual activity: Not Currently    Birth control/protection: Post-menopausal  Other Topics Concern   Not on file   Social History Narrative   Not on file   Social Drivers of Health   Tobacco Use: Medium Risk (10/15/2024)   Patient History    Smoking Tobacco Use: Former    Smokeless Tobacco Use: Never    Passive Exposure: Current  Physicist, Medical Strain: High Risk (09/17/2024)   Overall Financial Resource Strain (CARDIA)    Difficulty of Paying Living Expenses: Hard  Food Insecurity: Food Insecurity Present (09/17/2024)   Epic    Worried About Programme Researcher, Broadcasting/film/video in the Last Year: Sometimes true    Ran Out of Food in the Last Year: Sometimes true  Transportation Needs: No Transportation Needs (09/17/2024)   Epic    Lack of Transportation (Medical): No    Lack of Transportation (Non-Medical): No  Physical Activity: Inactive (09/17/2024)   Exercise Vital Sign    Days of Exercise per Week: 0 days    Minutes of Exercise per Session: Not on file  Stress: Stress Concern Present (09/17/2024)   [employer] of Occupational Health - Occupational Stress Questionnaire    Feeling of Stress: Very much  Social Connections: Moderately Isolated (09/17/2024)   Social Connection and Isolation Panel    Frequency of Communication with Friends and Family: Twice a week    Frequency of Social Gatherings with Friends and Family: Once a week    Attends Religious Services: Never    Database Administrator or Organizations: No    Attends Engineer, Structural: Not on file    Marital Status: Married  Depression (PHQ2-9): High Risk (09/24/2024)   Depression (PHQ2-9)    PHQ-2 Score: 14  Alcohol Screen: Low Risk (08/03/2023)   Alcohol Screen    Last Alcohol Screening Score (AUDIT): 0  Housing: Low Risk (09/17/2024)   Epic    Unable to Pay for Housing in the Last Year: No    Number of Times Moved in the Last Year: 0    Homeless in the Last Year: No  Utilities: Not on file  Health Literacy: Not on file    Review of systems General: negative for malaise, night sweats, fever, chills, weight  los Neck: Negative for lumps, goiter, pain and significant neck swelling Resp: Negative for cough, wheezing, dyspnea at rest CV: Negative for chest pain, leg swelling, palpitations, orthopnea GI: denies melena, hematochezia, nausea, vomiting, diarrhea, constipation, dysphagia, odyonophagia, early satiety or unintentional weight loss.  MSK: Negative for joint pain or, back pain +swelling to Left ankle/lower leg Derm: Negative for itching or rash Psych: Denies depression, anxiety, memory loss, confusion. No homicidal or suicidal ideation.  Heme: Negative for prolonged bleeding, bruising easily, and swollen nodes. Endocrine: Negative for cold or heat intolerance, polyuria, polydipsia and goiter. Neuro: negative for tremor, gait imbalance, syncope and seizures. The remainder of the review of systems is noncontributory.  Physical Exam: LMP 01/23/2014 Comment: spotting General:   Alert and oriented. No distress noted. Pleasant and cooperative.  Head:  Normocephalic and atraumatic. Eyes:  Conjuctiva clear without scleral icterus. Mouth:  Oral mucosa pink and moist. Good dentition. No lesions. Heart: Normal rate and rhythm, s1 and s2 heart sounds present.  Lungs: Clear lung sounds in all lobes. Respirations equal and unlabored. Abdomen:  +BS, soft, non-tender and non-distended. No rebound or guarding. No HSM or masses noted. Derm: No palmar erythema or jaundice  Msk:  Symmetrical without gross deformities. Normal posture. No erythema, discoloration or temperature change Extremities:  Without edema. Neurologic:  Alert and  oriented x4 Psych:  Alert and cooperative. Normal mood and affect.  Invalid input(s): 6 MONTHS   ASSESSMENT: Summer STRAUSER is a 63 y.o. female presenting today for GERD, dysphagia  and history of tubular adenomas of the colon  GERD/Dysphagia: ongoing dysphagia, patient had previously wanted to wait until January when colonoscopy was due to pursue EGD. She notes recent  URI infection/virus which flared up her reflux. Having a lot of discomfort in her throat/esophagus, worsening dysphagia and acid regurgitation. Did have doxycycline  during acute illness which could have precipitated some worsening UGI symptoms. No NSAID use.  Was taking famotidine , she added omeprazole  at my recommendation after she called last week, notes some mild improvement. At this time, will go ahead and get her scheduled for EGD, will increase omeprazole  to 40mg  BID, continue famotidine  40mg  at bedtime, add carafate  1g QID, instructed on good reflux precautions.   History of colonic tubular adenomas: due for Colonoscopy in January 2026, as above, will get her on the schedule for TCS and EGD. Indications, risks and benefits of procedure discussed in detail with patient. Patient verbalized understanding and is in agreement to proceed with EGD/Colonoscopy at this time.   In regard to her Left leg/ankle pain, no obvious signs of ischemia, I recommended she contact her PCP for further evaluation and to rule out DVT.    PLAN:  -omeprazole  40mg  BID -famotidine  40mg  at bedtime -carafate  1g QID  (dissolve tablet in 1/2 oz water , mix and drink) -good reflux precautions, bland/soft diet -schedule EGD/Colonoscopy ASA III  -see PCP regarding left leg/ankle pain   All questions were answered, patient verbalized understanding and is in agreement with plan as outlined above.   Follow Up: 2 months   Aspin Palomarez L. Mariette, MSN, APRN, AGNP-C Adult-Gerontology Nurse Practitioner [facility]  I have reviewed the note and agree with the APP's assessment as described in this progress note  Toribio Fortune, MD Gastroenterology and Hepatology [facility]astroenter[facility]

## 2024-10-15 NOTE — Telephone Encounter (Signed)
 I tried calling patient this morning to let her know I have a cancellation with Chelsea today at 0945, but did not get an answer.

## 2024-10-15 NOTE — Patient Instructions (Signed)
 We will increase omeprazole  40mg  to twice a day Continue famotidine  40mg  at night I have sent carafate  1g to take before meals and at bedtime, please dissolve tablet in 1/2 water , mix and drink slurry Avoid greasy, spicy, fried, citrus foods, and be mindful that caffeine, carbonated drinks, chocolate and alcohol can increase reflux symptoms Stay upright 2-3 hours after eating, prior to lying down and avoid eating late in the evenings.  We will get you scheduled for EGD and Colonoscopy  Follow up 2 months  It was a pleasure to see you today. I want to create trusting relationships with patients and provide genuine, compassionate, and quality care. I truly value your feedback! please be on the lookout for a survey regarding your visit with me today. I appreciate your input about our visit and your time in completing this!    Summer [name] L. Summer Pinkus, MSN, APRN, AGNP-C Adult-Gerontology Nurse Practitioner [facility]astroenter[facility] at [facility]

## 2024-10-15 NOTE — Telephone Encounter (Signed)
 Noted

## 2024-10-16 ENCOUNTER — Encounter: Payer: Self-pay | Admitting: Physical Medicine and Rehabilitation

## 2024-10-16 NOTE — Telephone Encounter (Signed)
 Prior auth approved, getting scanned into media

## 2024-10-19 ENCOUNTER — Other Ambulatory Visit: Payer: Self-pay | Admitting: Oncology

## 2024-10-19 DIAGNOSIS — I890 Lymphedema, not elsewhere classified: Secondary | ICD-10-CM

## 2024-10-29 ENCOUNTER — Encounter: Payer: Self-pay | Admitting: *Deleted

## 2024-10-31 ENCOUNTER — Other Ambulatory Visit: Payer: Self-pay

## 2024-10-31 ENCOUNTER — Encounter (HOSPITAL_COMMUNITY): Payer: Self-pay

## 2024-10-31 ENCOUNTER — Encounter (HOSPITAL_COMMUNITY)
Admission: RE | Admit: 2024-10-31 | Discharge: 2024-10-31 | Disposition: A | Discharge status: Home / Self Care | Source: Ambulatory Visit | Attending: Gastroenterology | Admitting: Gastroenterology

## 2024-11-05 ENCOUNTER — Encounter (HOSPITAL_COMMUNITY)
Admission: RE | Disposition: A | Discharge status: Home / Self Care | Payer: Self-pay | Source: Home / Self Care | Attending: Gastroenterology

## 2024-11-05 ENCOUNTER — Encounter (HOSPITAL_COMMUNITY): Payer: Self-pay | Admitting: Gastroenterology

## 2024-11-05 ENCOUNTER — Ambulatory Visit (HOSPITAL_COMMUNITY): Admitting: Anesthesiology

## 2024-11-05 ENCOUNTER — Other Ambulatory Visit: Payer: Self-pay

## 2024-11-05 ENCOUNTER — Ambulatory Visit (HOSPITAL_COMMUNITY)
Admission: RE | Admit: 2024-11-05 | Discharge: 2024-11-05 | Disposition: A | Discharge status: Home / Self Care | Attending: Gastroenterology | Admitting: Gastroenterology

## 2024-11-05 DIAGNOSIS — Z860101 Personal history of adenomatous and serrated colon polyps: Secondary | ICD-10-CM

## 2024-11-05 DIAGNOSIS — J449 Chronic obstructive pulmonary disease, unspecified: Secondary | ICD-10-CM | POA: Diagnosis not present

## 2024-11-05 DIAGNOSIS — Z1211 Encounter for screening for malignant neoplasm of colon: Secondary | ICD-10-CM | POA: Diagnosis not present

## 2024-11-05 DIAGNOSIS — K219 Gastro-esophageal reflux disease without esophagitis: Secondary | ICD-10-CM | POA: Diagnosis present

## 2024-11-05 DIAGNOSIS — G4730 Sleep apnea, unspecified: Secondary | ICD-10-CM | POA: Insufficient documentation

## 2024-11-05 DIAGNOSIS — D124 Benign neoplasm of descending colon: Secondary | ICD-10-CM | POA: Diagnosis not present

## 2024-11-05 DIAGNOSIS — Z6841 Body Mass Index (BMI) 40.0 and over, adult: Secondary | ICD-10-CM | POA: Diagnosis not present

## 2024-11-05 DIAGNOSIS — D125 Benign neoplasm of sigmoid colon: Secondary | ICD-10-CM | POA: Diagnosis not present

## 2024-11-05 DIAGNOSIS — E6689 Other obesity not elsewhere classified: Secondary | ICD-10-CM | POA: Insufficient documentation

## 2024-11-05 DIAGNOSIS — K3189 Other diseases of stomach and duodenum: Secondary | ICD-10-CM | POA: Diagnosis not present

## 2024-11-05 DIAGNOSIS — D175 Benign lipomatous neoplasm of intra-abdominal organs: Secondary | ICD-10-CM

## 2024-11-05 DIAGNOSIS — E039 Hypothyroidism, unspecified: Secondary | ICD-10-CM | POA: Insufficient documentation

## 2024-11-05 DIAGNOSIS — I509 Heart failure, unspecified: Secondary | ICD-10-CM | POA: Insufficient documentation

## 2024-11-05 DIAGNOSIS — I110 Hypertensive heart disease with heart failure: Secondary | ICD-10-CM | POA: Insufficient documentation

## 2024-11-05 DIAGNOSIS — K648 Other hemorrhoids: Secondary | ICD-10-CM | POA: Diagnosis not present

## 2024-11-05 DIAGNOSIS — K2970 Gastritis, unspecified, without bleeding: Secondary | ICD-10-CM

## 2024-11-05 DIAGNOSIS — R1319 Other dysphagia: Secondary | ICD-10-CM

## 2024-11-05 DIAGNOSIS — D128 Benign neoplasm of rectum: Secondary | ICD-10-CM | POA: Insufficient documentation

## 2024-11-05 DIAGNOSIS — R1310 Dysphagia, unspecified: Secondary | ICD-10-CM | POA: Diagnosis not present

## 2024-11-05 DIAGNOSIS — K635 Polyp of colon: Secondary | ICD-10-CM

## 2024-11-05 DIAGNOSIS — K449 Diaphragmatic hernia without obstruction or gangrene: Secondary | ICD-10-CM | POA: Insufficient documentation

## 2024-11-05 HISTORY — PX: POLYPECTOMY: SHX149

## 2024-11-05 HISTORY — PX: ESOPHAGOGASTRODUODENOSCOPY: SHX5428

## 2024-11-05 HISTORY — PX: ESOPHAGOSCOPY WITH DILITATION: SHX5618

## 2024-11-05 HISTORY — PX: COLONOSCOPY: SHX5424

## 2024-11-05 SURGERY — COLONOSCOPY
Anesthesia: Monitor Anesthesia Care

## 2024-11-05 MED ORDER — GLYCOPYRROLATE PF 0.2 MG/ML IJ SOSY
PREFILLED_SYRINGE | INTRAMUSCULAR | Status: DC | PRN
  Administered 2024-11-05: .2 mg via INTRAVENOUS

## 2024-11-05 MED ORDER — PROPOFOL 10 MG/ML IV BOLUS
INTRAVENOUS | Status: DC | PRN
  Administered 2024-11-05 (×2): 50 mg via INTRAVENOUS
  Administered 2024-11-05: 100 ug/kg/min via INTRAVENOUS

## 2024-11-05 MED ORDER — KETAMINE HCL 50 MG/5ML IJ SOSY
PREFILLED_SYRINGE | INTRAMUSCULAR | Status: DC | PRN
  Administered 2024-11-05 (×2): 25 mg via INTRAVENOUS

## 2024-11-05 MED ORDER — LACTATED RINGERS IV SOLN: INTRAVENOUS | Status: DC

## 2024-11-05 MED ORDER — LIDOCAINE 2% (20 MG/ML) 5 ML SYRINGE
INTRAMUSCULAR | Status: DC | PRN
  Administered 2024-11-05: 100 mg via INTRAVENOUS

## 2024-11-05 MED ORDER — KETAMINE HCL 50 MG/5ML IJ SOSY
PREFILLED_SYRINGE | INTRAMUSCULAR | Status: AC
  Filled 2024-11-05: qty 5

## 2024-11-05 MED ORDER — DIPHENHYDRAMINE HCL 50 MG/ML IJ SOLN
INTRAMUSCULAR | Status: DC | PRN
  Administered 2024-11-05: 12.5 mg via INTRAVENOUS

## 2024-11-05 MED ORDER — LIDOCAINE 2% (20 MG/ML) 5 ML SYRINGE
INTRAMUSCULAR | Status: AC
  Filled 2024-11-05: qty 5

## 2024-11-05 MED ORDER — GLYCOPYRROLATE PF 0.2 MG/ML IJ SOSY
PREFILLED_SYRINGE | INTRAMUSCULAR | Status: AC
  Filled 2024-11-05: qty 1

## 2024-11-05 NOTE — Progress Notes (Signed)
 EKG given to MD Kiel. Kiel present at bedside and spoke with pt. Okay given to proceed with procedure.

## 2024-11-05 NOTE — Discharge Instructions (Addendum)

## 2024-11-05 NOTE — Op Note (Signed)
 [facility] Patient Name: Summer [name] Procedure Date: 11/05/2024 1:27 PM MRN: [id number] Date of Birth: Jan 31, 1961 Attending MD: Deatrice Dine , MD, [id number] CSN: [id number] Age: 64 Admit Type: Outpatient Procedure:                Upper GI endoscopy Indications:              Dysphagia, Esophageal reflux symptoms that recur                            despite appropriate therapy Providers:                Deatrice Dine, MD, Devere Lodge, Bascom Blush Referring MD:              Medicines:                Monitored Anesthesia Care Complications:            No immediate complications. Estimated Blood Loss:     Estimated blood loss was minimal. Procedure:                Pre-Anesthesia Assessment:                           - Prior to the procedure, a History and Physical                            was performed, and patient medications and                            allergies were reviewed. The patient's tolerance of                            previous anesthesia was also reviewed. The risks                            and benefits of the procedure and the sedation                            options and risks were discussed with the patient.                            All questions were answered, and informed consent                            was obtained. Prior Anticoagulants: The patient has                            taken no anticoagulant or antiplatelet agents. ASA                            Grade Assessment: III - A patient with severe                            systemic disease. After reviewing the risks and  benefits, the patient was deemed in satisfactory                            condition to undergo the procedure.                           After obtaining informed consent, the endoscope was                            passed under direct vision. Throughout the                            procedure, the patient's blood pressure, pulse, and                             oxygen saturations were monitored continuously. The                            [id number] ([id number]) Upper was introduced through                            the mouth, and advanced to the second part of                            duodenum. The upper GI endoscopy was accomplished                            without difficulty. The patient tolerated the                            procedure well. Scope In: 1:56:02 PM Scope Out: 2:00:10 PM Total Procedure Duration: 0 hours 4 minutes 8 seconds  Findings:      No endoscopic abnormality was evident in the esophagus to explain the       patient's complaint of dysphagia. It was decided, however, to proceed       with dilation of the entire esophagus. A TTS dilator was passed through       the scope. Dilation with a 15-16.5-18 mm balloon dilator was performed       to 18 mm. The dilation site was examined following endoscope reinsertion       and showed mild mucosal disruption.      A 3 cm hiatal hernia was present.      The gastroesophageal flap valve was visualized endoscopically and       classified as Hill Grade IV (no fold, wide open lumen, hiatal hernia       present).      Mildly erythematous mucosa without bleeding was found in the stomach.       Biopsies were taken with a cold forceps for histology.      The duodenal bulb and second portion of the duodenum were normal. Impression:               - No endoscopic esophageal abnormality to explain                            patient's dysphagia. Esophagus  dilated. Dilated.                           - 3 cm hiatal hernia.                           - Gastroesophageal flap valve classified as Hill                            Grade IV (no fold, wide open lumen, hiatal hernia                            present).                           - Erythematous mucosa in the stomach. Biopsied.                           - Normal duodenal bulb and second portion of the                             duodenum. Moderate Sedation:      Per Anesthesia Care Recommendation:           - Patient has a contact number available for                            emergencies. The signs and symptoms of potential                            delayed complications were discussed with the                            patient. Return to normal activities tomorrow.                            Written discharge instructions were provided to the                            patient.                           - Resume previous diet.                           - Continue present medications.                           - Await pathology results.                           - Repeat upper endoscopy PRN. Procedure Code(s):        --- Professional ---                           [phone], Esophagogastroduodenoscopy, flexible,  transoral; with transendoscopic balloon dilation of                            esophagus (less than 30 mm diameter)                           [id number], 59, Esophagogastroduodenoscopy, flexible,                            transoral; with biopsy, single or multiple Diagnosis Code(s):        --- Professional ---                           R13.10, Dysphagia, unspecified                           K44.9, Diaphragmatic hernia without obstruction or                            gangrene                           K31.89, Other diseases of stomach and duodenum                           K21.9, Gastro-esophageal reflux disease without                            esophagitis CPT copyright 2022 American Medical Association. All rights reserved. The codes documented in this report are preliminary and upon coder review may  be revised to meet current compliance requirements. Deatrice Dine, MD Deatrice Dine, MD 11/05/2024 2:05:14 PM This report has been signed electronically. Number of Addenda: 0

## 2024-11-05 NOTE — Anesthesia Preprocedure Evaluation (Signed)
"                                    Anesthesia Evaluation  Patient identified by MRN, date of birth, ID band Patient awake    Reviewed: Allergy & Precautions, H&P , NPO status , Patient's Chart, lab work & pertinent test results, reviewed documented beta blocker date and time   Airway Mallampati: II  TM Distance: >3 FB Neck ROM: full    Dental no notable dental hx.    Pulmonary neg pulmonary ROS, asthma , sleep apnea , COPD, former smoker   Pulmonary exam normal breath sounds clear to auscultation       Cardiovascular Exercise Tolerance: Good hypertension, +CHF and + DOE  negative cardio ROS  Rhythm:regular Rate:Normal     Neuro/Psych  Neuromuscular disease negative neurological ROS  negative psych ROS   GI/Hepatic negative GI ROS, Neg liver ROS,GERD  ,,  Endo/Other  Hypothyroidism  Class 4 obesity  Renal/GU negative Renal ROS  negative genitourinary   Musculoskeletal   Abdominal   Peds  Hematology negative hematology ROS (+)   Anesthesia Other Findings   Reproductive/Obstetrics negative OB ROS                              Anesthesia Physical Anesthesia Plan  ASA: 3  Anesthesia Plan: MAC   Post-op Pain Management:    Induction:   PONV Risk Score and Plan: Propofol  infusion  Airway Management Planned:   Additional Equipment:   Intra-op Plan:   Post-operative Plan:   Informed Consent: I have reviewed the patients History and Physical, chart, labs and discussed the procedure including the risks, benefits and alternatives for the proposed anesthesia with the patient or authorized representative who has indicated his/her understanding and acceptance.     Dental Advisory Given  Plan Discussed with: CRNA  Anesthesia Plan Comments:         Anesthesia Quick Evaluation  "

## 2024-11-05 NOTE — Transfer of Care (Signed)
 Immediate Anesthesia Transfer of Care Note  Patient: Summer [name]  Procedure(s) Performed: COLONOSCOPY EGD (ESOPHAGOGASTRODUODENOSCOPY) ESOPHAGOSCOPY, WITH DILATION POLYPECTOMY, INTESTINE  Patient Location: Endoscopy Unit  Anesthesia Type:MAC  Level of Consciousness: awake  Airway & Oxygen Therapy: Patient Spontanous Breathing  Post-op Assessment: Report given to RN and Post -op Vital signs reviewed and stable  Post vital signs: Reviewed and stable  Last Vitals:  Vitals Value Taken Time  BP 87/59 11/05/24 14:30  Temp 36.7 C 11/05/24 14:30  Pulse 96 11/05/24 14:30  Resp 18 11/05/24 14:30  SpO2 98 % 11/05/24 14:30    Last Pain:  Vitals:   11/05/24 1430  TempSrc: Oral  PainSc: 2       Patients Stated Pain Goal: 6 (11/05/24 1430)  Complications: There were no known notable events for this encounter.

## 2024-11-05 NOTE — Op Note (Signed)
 [facility] Patient Name: Summer [name] Procedure Date: 11/05/2024 1:22 PM MRN: [id number] Date of Birth: 1961/01/03 Attending MD: Deatrice Dine , MD, [id number] CSN: [id number] Age: 64 Admit Type: Outpatient Procedure:                Colonoscopy Indications:              High risk colon cancer surveillance: Personal                            history of colonic polyps, High risk colon cancer                            surveillance: Personal history of multiple (3 or                            more) adenomas Providers:                Deatrice Dine, MD, Devere Lodge, Bascom Blush Referring MD:              Medicines:                Monitored Anesthesia Care Complications:            No immediate complications. Estimated Blood Loss:     Estimated blood loss was minimal. Procedure:                Pre-Anesthesia Assessment:                           - Prior to the procedure, a History and Physical                            was performed, and patient medications and                            allergies were reviewed. The patient's tolerance of                            previous anesthesia was also reviewed. The risks                            and benefits of the procedure and the sedation                            options and risks were discussed with the patient.                            All questions were answered, and informed consent                            was obtained. Prior Anticoagulants: The patient has                            taken no anticoagulant or antiplatelet agents. ASA  Grade Assessment: II - A patient with mild systemic                            disease. After reviewing the risks and benefits,                            the patient was deemed in satisfactory condition to                            undergo the procedure.                           After obtaining informed consent, the colonoscope                            was  passed under direct vision. Throughout the                            procedure, the patient's blood pressure, pulse, and                            oxygen saturations were monitored continuously. The                            [id number] ([id number]) Colon was introduced through                            the anus and advanced to the the cecum, identified                            by appendiceal orifice and ileocecal valve. The                            ileocecal valve, appendiceal orifice, and rectum                            were photographed. Scope In: 2:06:21 PM Scope Out: 2:24:36 PM Scope Withdrawal Time: 0 hours 15 minutes 40 seconds  Total Procedure Duration: 0 hours 18 minutes 15 seconds  Findings:      The perianal and digital rectal examinations were normal.      Three sessile polyps were found in the rectum, sigmoid colon and       descending colon. The polyps were 3 to 5 mm in size. These polyps were       removed with a cold snare. Resection and retrieval were complete.      There was a medium-sized lipoma, in the transverse colon.      Non-bleeding internal hemorrhoids were found during retroflexion. The       hemorrhoids were small. Impression:               - Three 3 to 5 mm polyps in the rectum, in the                            sigmoid colon and in the descending colon, removed  with a cold snare. Resected and retrieved.                           - Medium-sized lipoma in the transverse colon.                           - Non-bleeding internal hemorrhoids. Moderate Sedation:      Per Anesthesia Care Recommendation:           - Patient has a contact number available for                            emergencies. The signs and symptoms of potential                            delayed complications were discussed with the                            patient. Return to normal activities tomorrow.                            Written discharge instructions  were provided to the                            patient.                           - Resume previous diet.                           - Continue present medications.                           - Await pathology results.                           - Repeat colonoscopy in 5 years for surveillance                            based on pathology results.                           - Return to GI office as previously scheduled. Procedure Code(s):        --- Professional ---                           [phone], Colonoscopy, flexible; with removal of                            tumor(s), polyp(s), or other lesion(s) by snare                            technique Diagnosis Code(s):        --- Professional ---                           D12.8, Benign neoplasm of rectum  D12.5, Benign neoplasm of sigmoid colon                           D12.4, Benign neoplasm of descending colon                           D17.5, Benign lipomatous neoplasm of                            intra-abdominal organs                           K64.8, Other hemorrhoids                           Z86.010, Personal history of colonic polyps CPT copyright 2022 American Medical Association. All rights reserved. The codes documented in this report are preliminary and upon coder review may  be revised to meet current compliance requirements. Deatrice Dine, MD Deatrice Dine, MD 11/05/2024 2:36:25 PM This report has been signed electronically. Number of Addenda: 0

## 2024-11-05 NOTE — Progress Notes (Signed)
 Pt c/o mid chest pain rated 2/10 prior to procedure, MD Kendell made aware. EKG order placed.

## 2024-11-05 NOTE — Interval H&P Note (Signed)
 History and Physical Interval Note:  11/05/2024 1:01 PM  Summer [name]  has presented today for surgery, with the diagnosis of dysphagia, history of adenomatous colonic polyps.  The various methods of treatment have been discussed with the patient and family. After consideration of risks, benefits and other options for treatment, the patient has consented to  Procedures with comments: COLONOSCOPY (N/A) - 2:00pm, ASA 3 EGD (ESOPHAGOGASTRODUODENOSCOPY) (N/A) as a surgical intervention.  The patient's history has been reviewed, patient examined, no change in status, stable for surgery.  I have reviewed the patient's chart and labs.  Questions were answered to the patient's satisfaction.     Deatrice FALCON Ivet Guerrieri

## 2024-11-06 ENCOUNTER — Encounter (HOSPITAL_COMMUNITY): Payer: Self-pay | Admitting: Gastroenterology

## 2024-11-06 NOTE — Anesthesia Postprocedure Evaluation (Signed)
"   Anesthesia Post Note  Patient: KENNEDI LIZARDO  Procedure(s) Performed: COLONOSCOPY EGD (ESOPHAGOGASTRODUODENOSCOPY) ESOPHAGOSCOPY, WITH DILATION POLYPECTOMY, INTESTINE  Patient location during evaluation: Phase II Anesthesia Type: MAC Level of consciousness: awake Pain management: pain level controlled Vital Signs Assessment: post-procedure vital signs reviewed and stable Respiratory status: spontaneous breathing and respiratory function stable Cardiovascular status: blood pressure returned to baseline and stable Postop Assessment: no headache and no apparent nausea or vomiting Anesthetic complications: no Comments: Late entry   There were no known notable events for this encounter.   Last Vitals:  Vitals:   11/05/24 1435 11/05/24 1444  BP: 100/65 117/83  Pulse:    Resp:    Temp:    SpO2:      Last Pain:  Vitals:   11/05/24 1430  TempSrc: Oral  PainSc: 2                  Yvonna PARAS Shakur Lembo      "

## 2024-11-07 ENCOUNTER — Telehealth (INDEPENDENT_AMBULATORY_CARE_PROVIDER_SITE_OTHER): Payer: Self-pay

## 2024-11-07 ENCOUNTER — Other Ambulatory Visit (INDEPENDENT_AMBULATORY_CARE_PROVIDER_SITE_OTHER): Payer: Self-pay | Admitting: Gastroenterology

## 2024-11-07 ENCOUNTER — Ambulatory Visit (INDEPENDENT_AMBULATORY_CARE_PROVIDER_SITE_OTHER): Payer: Self-pay | Admitting: Gastroenterology

## 2024-11-07 DIAGNOSIS — M5440 Lumbago with sciatica, unspecified side: Secondary | ICD-10-CM

## 2024-11-07 LAB — SURGICAL PATHOLOGY

## 2024-11-07 NOTE — Telephone Encounter (Signed)
 I spoke with the patient in detail over the phone today.  Patient had upper endoscopy with dilation and colonoscopy with diminutive polyps removed 11/05/2024  Patient denies any abdominal pain dysphagia chest pain abdominal pain, fever , chills,  any overt bleeding such as hematemesis melena or hematochezia; which is reassuring and should a delayed complication was to happen with endoscopic procedure we would be a worry about these symptoms  Rather her dysphagia has improved after upper endoscopy and dilation  Patient main complaint remains back pain and new onset tongue numbness and tingling sensation and she is convinced it was anesthesia drugs . I told patient has I am not aware of propofol  causing these delayed symptoms  Patient denies any extremity weakness blurry vision headaches or shortness of breath  I will order abdominal x-ray  I went through surgical pathology results with the patient in detail over the phone as well Will reach out to anesthesia team to discuss patients symptoms ER precautions given if worsening symptoms including abdominal pain chest pain, fever chills any overt bleeding or extremity weakness

## 2024-11-07 NOTE — Telephone Encounter (Signed)
 Patient called today stating since the TCS and Egd done on 11/06/2023 she has been having some issues. She says during the procedure she mentioned to the surgical staff that something was not right as she felt her mouth and her tongue go numb. She says this has been ongoing since that day.   She also reports when she got up yesterday she had some pretty significant lower back pain, that she says is intense and hurts to walk. Patient says she has some abdominal cramping at times, denies any nausea, vomiting, fever, diarrhea or constipation, dark or bloody stools. She has had two normal bm's since the procedure.   Patient says she is concerned about these issues. Please advise.

## 2024-11-07 NOTE — Telephone Encounter (Signed)
 Noted thank you

## 2024-11-08 ENCOUNTER — Encounter (HOSPITAL_COMMUNITY): Payer: Self-pay

## 2024-11-08 ENCOUNTER — Ambulatory Visit: Payer: Self-pay

## 2024-11-08 ENCOUNTER — Encounter: Payer: Self-pay | Admitting: Allergy & Immunology

## 2024-11-08 ENCOUNTER — Ambulatory Visit (HOSPITAL_COMMUNITY)
Admission: RE | Admit: 2024-11-08 | Discharge: 2024-11-08 | Disposition: A | Discharge status: Home / Self Care | Source: Ambulatory Visit | Attending: Gastroenterology | Admitting: Gastroenterology

## 2024-11-08 DIAGNOSIS — M5440 Lumbago with sciatica, unspecified side: Secondary | ICD-10-CM | POA: Diagnosis present

## 2024-11-08 NOTE — Telephone Encounter (Signed)
 FYI Only or Action Required?: FYI only for provider: appointment scheduled on 11/12/24.  Patient was last seen in primary care on 10/03/2024 by Alphonsa Glendia LABOR, MD.  Called Nurse Triage reporting Headache, Numbness, and Back Pain.  Symptoms began several days ago.  Interventions attempted: Nothing.  Symptoms are: gradually worsening.  Triage Disposition: See Within 3 Days in Office (overriding See Physician Within 24 Hours)  Patient/caregiver understands and will follow disposition?: Yes  Reason for Disposition  [1] MODERATE headache (e.g., interferes with normal activities) AND [2] present > 24 hours AND [3] unexplained  (Exceptions: Pain medicines not tried, typical migraine, or headache part of viral illness.)  Answer Assessment - Initial Assessment Questions Patient states that she had a colonoscopy on Monday and since then she has had a headache behind her left eye and numbness in her tongue and lips. She states that she started to feel the numbness right after she was given anesthesia medications prior to procedure, it got better after the procedure but is still present. She states the headache has worsened today and is 5/10 pain. She reached out to the doctor that performed the procedure and was advised to contact anesthesia doctor. When she reached out to anesthesia they advised her to contact her PCP. Office visit advised per triage. Scheduled for 11/12/24, no sooner appts available with PCP office and patient does not want to go to UC. Advised to call back if symptoms worsen.   1. LOCATION: Where does it hurt?      Behind left eye  2. ONSET: When did the headache start? (e.g., minutes, hours, days)      Monday, after colonoscopy  3. PATTERN: Does the pain come and go, or has it been constant since it started?     Constant  4. SEVERITY: How bad is the pain? and What does it keep you from doing?  (e.g., Scale 1-10; mild, moderate, or severe)     5/10  5. RECURRENT  SYMPTOM: Have you ever had headaches before? If Yes, ask: When was the last time? and What happened that time?      No, never had headache like this one  6. CAUSE: What do you think is causing the headache?     Not sure  7. MIGRAINE: Have you been diagnosed with migraine headaches? If Yes, ask: Is this headache similar?      No  8. HEAD INJURY: Has there been any recent injury to your head?      No  9. OTHER SYMPTOMS: Do you have any other symptoms? (e.g., fever, stiff neck, eye pain, sore throat, cold symptoms)     Numbness in lips and tongue-states that she felt this right after receiving anesthesia medication  10. PREGNANCY: Is there any chance you are pregnant? When was your last menstrual period?       NA  Protocols used: [facility]  Copied from CRM [id number]. Topic: Clinical - Red Word Triage >> Nov 08, 2024 10:50 AM Nathanel BROCKS wrote: Red Word that prompted transfer to Nurse Triage: had colonscopy done on Monday, since then she states that she has a massive headache in her eye, tongue and lips is numb, lower back pain.  All started since colonoscopy.

## 2024-11-09 ENCOUNTER — Other Ambulatory Visit: Payer: Self-pay | Admitting: Allergy & Immunology

## 2024-11-09 ENCOUNTER — Ambulatory Visit (HOSPITAL_COMMUNITY): Admitting: Physical Therapy

## 2024-11-12 ENCOUNTER — Ambulatory Visit: Admitting: Family Medicine

## 2024-11-12 ENCOUNTER — Ambulatory Visit (HOSPITAL_COMMUNITY)

## 2024-11-12 VITALS — BP 170/93 | HR 88 | Temp 98.1°F | Ht 64.0 in | Wt 317.0 lb

## 2024-11-12 DIAGNOSIS — R200 Anesthesia of skin: Secondary | ICD-10-CM | POA: Diagnosis not present

## 2024-11-12 NOTE — Progress Notes (Signed)
 "  Subjective:  Patient ID: Summer [name], female    DOB: Aug 31, 1961  Age: 64 y.o. MRN: [id number]  CC:   Chief Complaint  Patient presents with   lip and tongue numbness    Since colonoscopy and endoscopy     HPI:  64 year old female presents for evaluation of the above.  Patient recently had endoscopy and colonoscopy on 1/5.  Patient reports that at the time that the procedure was going to be started she developed numbness of her lower lip and tongue.  After the procedures were done, she continued to have numbness and tingling of her tongue and lower lip.  She informed GI as well as anesthesia.  Possible neuropraxia versus allergic response.  There have been no other associated symptoms with to suggest stroke.  However, patient and her husband are concerned about the possibility of stroke.  She is also concerned that the this has not resolved.  She endorses some speech difficulty but appears to be speaking normally here today.  Patient Active Problem List   Diagnosis Date Noted   Numbness of tongue 11/12/2024   Gastritis and gastroduodenitis 11/05/2024   Esophageal dysphagia 11/05/2024   Adenomatous polyp of descending colon 11/05/2024   COPD exacerbation (HCC) 09/24/2024   Chronic sinusitis 10/18/2023   Diastolic CHF, chronic (HCC) 08/03/2023   Thickened endometrium 03/22/2023   Nonrheumatic mitral valve regurgitation 03/15/2023   OSA (obstructive sleep apnea) 03/07/2023   Moderate persistent asthma 03/07/2023   Gallbladder polyp 02/22/2023   Aortic atherosclerosis 02/02/2023   Mast cell activation syndrome 01/14/2023   Fatty liver 09/20/2022   Carpal tunnel syndrome 05/18/2022   Spinal stenosis 05/18/2022   Constipation 04/06/2022   DOE (dyspnea on exertion) 01/18/2022   Emphysema lung (HCC) 01/15/2022   Hyperlipidemia 01/15/2022   Elevated hemoglobin 01/15/2022   Morbid obesity (HCC) 05/28/2021   Prediabetes 06/10/2014   Hypothyroidism 06/10/2014   Gastroesophageal  reflux disease 04/01/2013   Fibromyalgia 03/22/2013    Social Hx   Social History   Socioeconomic History   Marital status: Married    Spouse name: Not on file   Number of children: Not on file   Years of education: Not on file   Highest education level: 12th grade  Occupational History   Not on file  Tobacco Use   Smoking status: Former    Current packs/day: 0.00    Average packs/day: 1 pack/day for 20.0 years (20.0 ttl pk-yrs)    Types: Cigarettes    Start date: 11/08/1983    Quit date: 11/08/2003    Years since quitting: 21.0    Passive exposure: Current   Smokeless tobacco: Never   Tobacco comments:    quit smoking 10 yrs   Vaping Use   Vaping status: Never Used  Substance and Sexual Activity   Alcohol use: No   Drug use: No   Sexual activity: Not Currently    Birth control/protection: Post-menopausal  Other Topics Concern   Not on file  Social History Narrative   Not on file   Social Drivers of Health   Tobacco Use: Medium Risk (11/05/2024)   Patient History    Smoking Tobacco Use: Former    Smokeless Tobacco Use: Never    Passive Exposure: Current  Physicist, Medical Strain: High Risk (09/17/2024)   Overall Financial Resource Strain (CARDIA)    Difficulty of Paying Living Expenses: Hard  Food Insecurity: Food Insecurity Present (09/17/2024)   Epic    Worried About Radiation Protection Practitioner  of Food in the Last Year: Sometimes true    Ran Out of Food in the Last Year: Sometimes true  Transportation Needs: No Transportation Needs (09/17/2024)   Epic    Lack of Transportation (Medical): No    Lack of Transportation (Non-Medical): No  Physical Activity: Inactive (09/17/2024)   Exercise Vital Sign    Days of Exercise per Week: 0 days    Minutes of Exercise per Session: Not on file  Stress: Stress Concern Present (09/17/2024)   [employer] of Occupational Health - Occupational Stress Questionnaire    Feeling of Stress: Very much  Social Connections: Moderately  Isolated (09/17/2024)   Social Connection and Isolation Panel    Frequency of Communication with Friends and Family: Twice a week    Frequency of Social Gatherings with Friends and Family: Once a week    Attends Religious Services: Never    Database Administrator or Organizations: No    Attends Engineer, Structural: Not on file    Marital Status: Married  Depression (PHQ2-9): High Risk (11/12/2024)   Depression (PHQ2-9)    PHQ-2 Score: 13  Alcohol Screen: Low Risk (08/03/2023)   Alcohol Screen    Last Alcohol Screening Score (AUDIT): 0  Housing: Low Risk (09/17/2024)   Epic    Unable to Pay for Housing in the Last Year: No    Number of Times Moved in the Last Year: 0    Homeless in the Last Year: No  Utilities: Not on file  Health Literacy: Not on file    Review of Systems Per HPI  Objective:  BP (!) 170/93   Pulse 88   Temp 98.1 F (36.7 C)   Ht 5' 4 (1.626 m)   Wt (!) 317 lb (143.8 kg)   LMP 01/23/2014 Comment: spotting  SpO2 98%   BMI 54.41 kg/m      11/12/2024    2:25 PM 11/05/2024    2:44 PM 11/05/2024    2:35 PM  BP/Weight  Systolic BP 170 117 100  Diastolic BP 93 83 65  Wt. (Lbs) 317    BMI 54.41 kg/m2      Physical Exam Vitals and nursing note reviewed.  Constitutional:      General: She is not in acute distress.    Appearance: Normal appearance. She is obese.  HENT:     Head: Normocephalic and atraumatic.     Mouth/Throat:     Comments: No appreciable abnormalities of the tongue or lips. Cardiovascular:     Rate and Rhythm: Normal rate and regular rhythm.  Pulmonary:     Effort: Pulmonary effort is normal.     Breath sounds: Normal breath sounds.  Neurological:     Mental Status: She is alert.     Motor: No weakness.     Lab Results  Component Value Date   WBC 7.6 08/17/2024   HGB 16.8 (H) 08/17/2024   HCT 50.1 (H) 08/17/2024   PLT 278 08/17/2024   GLUCOSE 138 (H) 08/17/2024   CHOL 175 04/22/2023   TRIG 188 (H) 04/22/2023    HDL 49 04/22/2023   LDLCALC 94 04/22/2023   ALT 16 08/17/2024   AST 24 08/17/2024   NA 140 08/17/2024   K 4.1 08/17/2024   CL 103 08/17/2024   CREATININE 0.73 08/17/2024   BUN 16 08/17/2024   CO2 27 08/17/2024   TSH 3.540 02/02/2024   INR 1.0 12/31/2022   HGBA1C 6.2 (H) 04/03/2021  Assessment & Plan:  Numbness of tongue Assessment & Plan: Unremarkable exam here today.  Patient continues to have numbness of the tongue as well as the lower lip.  Possible neuropraxia.  Discussed case with her primary care physician Dr. Alphonsa who also examined the patient.  Watchful waiting at this time.  No imaging or further evaluation.  Supportive care.  This will likely self resolve.     Follow-up: Patient will reach out to PCP for follow-up  Emanual Lamountain Bluford DO [facility] "

## 2024-11-12 NOTE — Assessment & Plan Note (Signed)
 Unremarkable exam here today.  Patient continues to have numbness of the tongue as well as the lower lip.  Possible neuropraxia.  Discussed case with her primary care physician Dr. Alphonsa who also examined the patient.  Watchful waiting at this time.  No imaging or further evaluation.  Supportive care.  This will likely self resolve.

## 2024-11-14 ENCOUNTER — Ambulatory Visit (HOSPITAL_COMMUNITY): Admitting: Physical Therapy

## 2024-11-14 ENCOUNTER — Other Ambulatory Visit

## 2024-11-15 NOTE — Progress Notes (Signed)
5 yr TCS noted in recall Patient's PCP is on EPIC

## 2024-11-16 ENCOUNTER — Ambulatory Visit (HOSPITAL_COMMUNITY): Admitting: Physical Therapy

## 2024-11-19 ENCOUNTER — Ambulatory Visit (HOSPITAL_COMMUNITY)

## 2024-11-20 ENCOUNTER — Ambulatory Visit (INDEPENDENT_AMBULATORY_CARE_PROVIDER_SITE_OTHER): Payer: Self-pay | Admitting: Gastroenterology

## 2024-11-21 ENCOUNTER — Ambulatory Visit (HOSPITAL_COMMUNITY): Admitting: Physical Therapy

## 2024-11-21 ENCOUNTER — Ambulatory Visit: Admitting: "Endocrinology

## 2024-11-22 ENCOUNTER — Ambulatory Visit: Payer: Self-pay
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
## 2024-11-23 ENCOUNTER — Ambulatory Visit (HOSPITAL_COMMUNITY): Admitting: Physical Therapy

## 2024-11-23 ENCOUNTER — Ambulatory Visit: Payer: Self-pay | Admitting: Family Medicine

## 2024-11-23 ENCOUNTER — Ambulatory Visit: Admitting: Family Medicine

## 2024-11-23 VITALS — BP 132/82 | HR 83 | Temp 99.8°F | Ht 64.0 in | Wt 322.4 lb

## 2024-11-23 DIAGNOSIS — R300 Dysuria: Secondary | ICD-10-CM

## 2024-11-23 LAB — POCT URINALYSIS DIPSTICK
Bilirubin, UA: NEGATIVE
Blood, UA: 250
Glucose, UA: NEGATIVE
Ketones, UA: NEGATIVE
Nitrite, UA: NEGATIVE
Protein, UA: POSITIVE — AB
Spec Grav, UA: 1.02
Urobilinogen, UA: NEGATIVE U/dL — AB
pH, UA: 6

## 2024-11-23 MED ORDER — CEPHALEXIN 500 MG PO CAPS: 500.0000 mg | ORAL_CAPSULE | Freq: Three times a day (TID) | ORAL | 0 refills | Status: AC

## 2024-11-23 NOTE — Progress Notes (Signed)
" ° °  Subjective:    Patient ID: Summer [name], female    DOB: [birth year], 63 y.o.   MRN: [id number]  HPI Patient is here for having low back pain, frequent urination or feels like she has to urinate all the time and has saw some blood in her urine.  It has been going on for about 3 days  No high fever chills or sweats no wheezing or difficulty breathing  Review of Systems     Objective:   Physical Exam Lungs are clear hearts regular flanks nontender  UA with WBCs and RBCs     Assessment & Plan:  Urine culture sent Suspected infection with hematuria If culture negative will need urologic workup Keflex  3 times daily for 5 days Follow-up if ongoing troubles  "

## 2024-11-23 NOTE — Addendum Note (Signed)
 Addended by: ALPHONSA HAMILTON A on: 11/23/2024 12:08 PM   Modules accepted: Orders

## 2024-11-25 LAB — URINE CULTURE

## 2024-11-26 ENCOUNTER — Ambulatory Visit (HOSPITAL_COMMUNITY): Admitting: Physical Therapy

## 2024-11-27 NOTE — Telephone Encounter (Signed)
 I called the patient and got her rescheduled to see Dr. Iva next week in [place].

## 2024-11-27 NOTE — Telephone Encounter (Signed)
 Nurses Urine culture showed contamination meaning that there was more than 1 type of bacteria present which is an indication that when the urine was collected it was not a clean-catch.  In the situations we recommend finishing out the antibiotic but because she is having ongoing bleeding we need to rule out other issues  In the situations we would recommend urgent CT abdomen pelvis hematuria protocol (Ordered CT hematuria) this is a way to look at the kidneys or any obvious problem within the kidneys and also to look at the urine collecting system to look for any signs of stones  If this test comes back negative then we would recommend a consultation with urology.  Also recommend that the patient go ahead and set herself up an appointment with her gynecologist as well to make sure there is no sign of bleeding from anywhere else.  CT scan preferably needs to be completed within the next 7 to 10 days either this week or early next week thank you-Dr. Glendia

## 2024-11-28 ENCOUNTER — Ambulatory Visit: Admitting: Family Medicine

## 2024-11-28 ENCOUNTER — Ambulatory Visit (HOSPITAL_COMMUNITY): Admitting: Physical Therapy

## 2024-11-29 ENCOUNTER — Ambulatory Visit (INDEPENDENT_AMBULATORY_CARE_PROVIDER_SITE_OTHER): Admitting: Gastroenterology

## 2024-11-30 ENCOUNTER — Ambulatory Visit (HOSPITAL_COMMUNITY): Admitting: Physical Therapy

## 2024-12-03 ENCOUNTER — Ambulatory Visit (HOSPITAL_COMMUNITY): Admitting: Physical Therapy

## 2024-12-05 ENCOUNTER — Ambulatory Visit: Admitting: Allergy & Immunology

## 2024-12-05 ENCOUNTER — Ambulatory Visit (HOSPITAL_COMMUNITY): Admitting: Physical Therapy

## 2024-12-06 ENCOUNTER — Other Ambulatory Visit

## 2024-12-06 ENCOUNTER — Other Ambulatory Visit: Payer: Self-pay

## 2024-12-07 ENCOUNTER — Ambulatory Visit (HOSPITAL_COMMUNITY): Admitting: Physical Therapy

## 2024-12-13 ENCOUNTER — Ambulatory Visit: Admitting: "Endocrinology

## 2024-12-17 ENCOUNTER — Ambulatory Visit (HOSPITAL_COMMUNITY): Admitting: Physical Therapy

## 2024-12-19 ENCOUNTER — Ambulatory Visit (HOSPITAL_COMMUNITY): Admitting: Physical Therapy

## 2024-12-21 ENCOUNTER — Ambulatory Visit: Admitting: Family Medicine

## 2024-12-21 ENCOUNTER — Ambulatory Visit (HOSPITAL_COMMUNITY)

## 2024-12-24 ENCOUNTER — Ambulatory Visit (HOSPITAL_COMMUNITY)

## 2024-12-25 ENCOUNTER — Ambulatory Visit: Admitting: Family Medicine

## 2024-12-26 ENCOUNTER — Other Ambulatory Visit

## 2024-12-26 ENCOUNTER — Ambulatory Visit (HOSPITAL_COMMUNITY): Admitting: Physical Therapy

## 2024-12-28 ENCOUNTER — Ambulatory Visit (HOSPITAL_COMMUNITY)

## 2024-12-31 ENCOUNTER — Ambulatory Visit (HOSPITAL_COMMUNITY)

## 2025-01-02 ENCOUNTER — Ambulatory Visit (HOSPITAL_COMMUNITY): Admitting: Physical Therapy

## 2025-01-02 ENCOUNTER — Ambulatory Visit: Admitting: "Endocrinology

## 2025-01-04 ENCOUNTER — Ambulatory Visit (HOSPITAL_COMMUNITY): Admitting: Physical Therapy

## 2025-01-07 ENCOUNTER — Ambulatory Visit (HOSPITAL_COMMUNITY)

## 2025-01-09 ENCOUNTER — Ambulatory Visit (HOSPITAL_COMMUNITY): Admitting: Physical Therapy

## 2025-01-11 ENCOUNTER — Ambulatory Visit (HOSPITAL_COMMUNITY): Admitting: Physical Therapy

## 2025-01-17 ENCOUNTER — Encounter: Admitting: Physical Medicine and Rehabilitation

## 2025-01-21 ENCOUNTER — Ambulatory Visit: Admitting: Family Medicine

## 2025-02-22 ENCOUNTER — Inpatient Hospital Stay

## 2025-03-01 ENCOUNTER — Inpatient Hospital Stay: Admitting: Oncology
